# Patient Record
Sex: Female | Born: 1950
Health system: Southern US, Community
[De-identification: ages and names within clinical notes are randomized; demographics above are authoritative.]

## PROBLEM LIST (undated history)

## (undated) ENCOUNTER — Ambulatory Visit (HOSPITAL_COMMUNITY): Admission: EM | Payer: Medicare Other | Source: Home / Self Care

## (undated) DIAGNOSIS — G43909 Migraine, unspecified, not intractable, without status migrainosus: Secondary | ICD-10-CM

## (undated) DIAGNOSIS — G4730 Sleep apnea, unspecified: Secondary | ICD-10-CM

## (undated) DIAGNOSIS — D689 Coagulation defect, unspecified: Secondary | ICD-10-CM

## (undated) DIAGNOSIS — J438 Other emphysema: Secondary | ICD-10-CM

## (undated) DIAGNOSIS — D649 Anemia, unspecified: Secondary | ICD-10-CM

## (undated) DIAGNOSIS — I10 Essential (primary) hypertension: Secondary | ICD-10-CM

## (undated) DIAGNOSIS — Z87891 Personal history of nicotine dependence: Secondary | ICD-10-CM

## (undated) DIAGNOSIS — H269 Unspecified cataract: Secondary | ICD-10-CM

## (undated) DIAGNOSIS — I700 Atherosclerosis of aorta: Secondary | ICD-10-CM

## (undated) DIAGNOSIS — T7840XA Allergy, unspecified, initial encounter: Secondary | ICD-10-CM

## (undated) DIAGNOSIS — R911 Solitary pulmonary nodule: Secondary | ICD-10-CM

## (undated) DIAGNOSIS — D473 Essential (hemorrhagic) thrombocythemia: Secondary | ICD-10-CM

## (undated) DIAGNOSIS — I7121 Aneurysm of the ascending aorta, without rupture: Secondary | ICD-10-CM

## (undated) HISTORY — DX: Essential (hemorrhagic) thrombocythemia: D47.3

## (undated) HISTORY — PX: NASAL SEPTUM SURGERY: SHX37

## (undated) HISTORY — PX: COSMETIC SURGERY: SHX468

## (undated) HISTORY — DX: Migraine, unspecified, not intractable, without status migrainosus: G43.909

## (undated) HISTORY — DX: Atherosclerosis of aorta: I70.0

## (undated) HISTORY — DX: Anemia, unspecified: D64.9

## (undated) HISTORY — DX: Allergy, unspecified, initial encounter: T78.40XA

## (undated) HISTORY — DX: Other emphysema: J43.8

## (undated) HISTORY — DX: Essential (primary) hypertension: I10

## (undated) HISTORY — DX: Coagulation defect, unspecified: D68.9

## (undated) HISTORY — DX: Aneurysm of the ascending aorta, without rupture: I71.21

## (undated) HISTORY — DX: Sleep apnea, unspecified: G47.30

## (undated) HISTORY — DX: Unspecified cataract: H26.9

## (undated) HISTORY — DX: Personal history of nicotine dependence: Z87.891

## (undated) HISTORY — DX: Solitary pulmonary nodule: R91.1

---

## 2013-02-14 ENCOUNTER — Emergency Department (HOSPITAL_COMMUNITY)
Admission: EM | Admit: 2013-02-14 | Discharge: 2013-02-15 | Disposition: A | Discharge status: Home / Self Care | Payer: Self-pay | Attending: Emergency Medicine | Admitting: Emergency Medicine

## 2013-02-14 ENCOUNTER — Encounter (HOSPITAL_COMMUNITY): Payer: Self-pay | Admitting: Emergency Medicine

## 2013-02-14 DIAGNOSIS — Z880 Allergy status to penicillin: Secondary | ICD-10-CM | POA: Insufficient documentation

## 2013-02-14 DIAGNOSIS — Z79899 Other long term (current) drug therapy: Secondary | ICD-10-CM | POA: Insufficient documentation

## 2013-02-14 DIAGNOSIS — F1010 Alcohol abuse, uncomplicated: Secondary | ICD-10-CM

## 2013-02-14 DIAGNOSIS — F17200 Nicotine dependence, unspecified, uncomplicated: Secondary | ICD-10-CM | POA: Insufficient documentation

## 2013-02-14 DIAGNOSIS — R630 Anorexia: Secondary | ICD-10-CM | POA: Insufficient documentation

## 2013-02-14 DIAGNOSIS — F1020 Alcohol dependence, uncomplicated: Secondary | ICD-10-CM | POA: Insufficient documentation

## 2013-02-14 LAB — RAPID URINE DRUG SCREEN, HOSP PERFORMED
Barbiturates: NOT DETECTED
Benzodiazepines: NOT DETECTED
Cocaine: NOT DETECTED

## 2013-02-14 LAB — COMPREHENSIVE METABOLIC PANEL
Albumin: 5 g/dL (ref 3.5–5.2)
Alkaline Phosphatase: 95 U/L (ref 39–117)
BUN: 7 mg/dL (ref 6–23)
Chloride: 95 mEq/L — ABNORMAL LOW (ref 96–112)
GFR calc Af Amer: >90 mL/min (ref >90)
Glucose, Bld: 78 mg/dL (ref 70–99)
Potassium: 4.2 mEq/L (ref 3.5–5.1)
Sodium: 133 mEq/L — ABNORMAL LOW (ref 135–145)
Total Bilirubin: 0.6 mg/dL (ref 0.3–1.2)

## 2013-02-14 LAB — CBC
HCT: 44.5 % (ref 36.0–46.0)
Hemoglobin: 16 g/dL — ABNORMAL HIGH (ref 12.0–15.0)
MCHC: 36 g/dL (ref 30.0–36.0)
MCV: 93.5 fL (ref 78.0–100.0)
RBC: 4.76 MIL/uL (ref 3.87–5.11)
RDW: 14 % (ref 11.5–15.5)
WBC: 11.6 10*3/uL — ABNORMAL HIGH (ref 4.0–10.5)

## 2013-02-14 LAB — ETHANOL: Alcohol, Ethyl (B): 266 mg/dL — ABNORMAL HIGH (ref 0–11)

## 2013-02-14 LAB — ACETAMINOPHEN LEVEL: Acetaminophen (Tylenol), Serum: <15 ug/mL (ref 10–30)

## 2013-02-14 MED ORDER — ALUM & MAG HYDROXIDE-SIMETH 200-200-20 MG/5ML PO SUSP: 30.0000 mL | ORAL | Status: DC | PRN

## 2013-02-14 MED ORDER — LORAZEPAM 1 MG PO TABS: 0.0000 mg | ORAL_TABLET | Freq: Two times a day (BID) | ORAL | Status: DC

## 2013-02-14 MED ORDER — ZOLPIDEM TARTRATE 5 MG PO TABS: 5.0000 mg | ORAL_TABLET | Freq: Every evening | ORAL | Status: DC | PRN

## 2013-02-14 MED ORDER — ACETAMINOPHEN 325 MG PO TABS: 650.0000 mg | ORAL_TABLET | ORAL | Status: DC | PRN

## 2013-02-14 MED ORDER — IBUPROFEN 200 MG PO TABS: 600.0000 mg | ORAL_TABLET | Freq: Three times a day (TID) | ORAL | Status: DC | PRN

## 2013-02-14 MED ORDER — ONDANSETRON HCL 4 MG PO TABS: 4.0000 mg | ORAL_TABLET | Freq: Three times a day (TID) | ORAL | Status: DC | PRN

## 2013-02-14 MED ORDER — LORAZEPAM 1 MG PO TABS
0.0000 mg | ORAL_TABLET | Freq: Four times a day (QID) | ORAL | Status: DC
  Administered 2013-02-14 – 2013-02-15 (×2): 1 mg via ORAL
  Filled 2013-02-14 (×2): qty 1

## 2013-02-14 NOTE — ED Provider Notes (Signed)
CSN: [id number]     Arrival date & time 02/14/13  1616 History  This chart was scribed for non-physician practitioner working with Shon Baton, MD by Ashley Jacobs, ED scribe. This patient was seen in room [facility] and the patient's care was started at 5:30 PM.   First MD Initiated Contact with Patient 02/14/13 1652     Chief Complaint  Patient presents with  . Medical Clearance   (Consider location/radiation/quality/duration/timing/severity/associated sxs/prior Treatment) The history is provided by the patient and medical records. No language interpreter was used.   HPI Comments: Kara [name] is a 62 y.o. female who presents to the Emergency Department complaining for medical clearance. Pt reports that she desire to detox from alcohol because she has "been drinking too much". She currently drinks 2 bottles of wine everyday and admits that she drank 1 bottle one of wine today. She also complains of lack of appetite for the past 5 days.  Pt denies SI and HI. She is tearful and states she "felt so sick" and "if I keep this up, I am going to die".  She denies abdominal pain. Pt smoke tobacco every day and explains that she has been "chain smoking" for the past months.Pt reports she has "hemochromatosis".  She has allergies to Penicillins. She does not uses drugs.  History reviewed. No pertinent past medical history. History reviewed. No pertinent past surgical history. No family history on file. History  Substance Use Topics  . Smoking status: Current Every Day Smoker  . Smokeless tobacco: Not on file  . Alcohol Use: Yes     Comment: 2 bottles of wine/day   OB History   Grav Para Term Preterm Abortions TAB SAB Ect Mult Living                 Review of Systems  Constitutional: Positive for appetite change.  Gastrointestinal: Negative for abdominal pain.  Psychiatric/Behavioral: Negative for suicidal ideas.    Allergies  Penicillins  Home Medications  No current  outpatient prescriptions on file. BP 130/93  Pulse 87  Temp(Src) 97.6 F (36.4 C) (Oral)  Resp 18  SpO2 97% Physical Exam  Nursing note and vitals reviewed. Constitutional: She is oriented to person, place, and time. She appears well-developed and well-nourished. No distress.  Very tearful and labile emotions  HENT:  Head: Normocephalic and atraumatic.  Mouth/Throat: Oropharynx is clear and moist.  Eyes: Conjunctivae are normal. Pupils are equal, round, and reactive to light. No scleral icterus.  Neck: Neck supple.  Cardiovascular: Normal rate, regular rhythm, normal heart sounds and intact distal pulses.   No murmur heard. Pulmonary/Chest: Effort normal and breath sounds normal. No stridor. No respiratory distress. She has no rales.  Abdominal: Soft. Bowel sounds are normal. She exhibits no distension. There is no tenderness.  Musculoskeletal: Normal range of motion.  Neurological: She is alert and oriented to person, place, and time.  Skin: Skin is warm and dry. No rash noted.  Psychiatric: She has a normal mood and affect. Her behavior is normal.    ED Course  Procedures (including critical care time) DIAGNOSTIC STUDIES: Oxygen Saturation is 97% on room air, normal by my interpretation.    COORDINATION OF CARE: 5:34 PM Discussed course of care with pt consult with psychologist. Pt understands and agrees.  7:31 PM Pt is medically cleared.  Will initiate psych hold, contact TSS, CIWA protocol placed.  Labs Review Labs Reviewed  CBC - Abnormal; Notable for the following:    WBC  11.6 (*)    Hemoglobin 16.0 (*)    All other components within normal limits  COMPREHENSIVE METABOLIC PANEL - Abnormal; Notable for the following:    Sodium 133 (*)    Chloride 95 (*)    Creatinine, Ser 0.38 (*)    AST 117 (*)    ALT 76 (*)    All other components within normal limits  ETHANOL - Abnormal; Notable for the following:    Alcohol, Ethyl (B) 266 (*)    All other components within  normal limits  SALICYLATE LEVEL - Abnormal; Notable for the following:    Salicylate Lvl <2.0 (*)    All other components within normal limits  ACETAMINOPHEN LEVEL  URINE RAPID DRUG SCREEN (HOSP PERFORMED)   Imaging Review No results found.  EKG Interpretation   None       MDM   1. Alcohol abuse    BP 130/93  Pulse 87  Temp(Src) 97.6 F (36.4 C) (Oral)  Resp 18  SpO2 97%   I personally performed the services described in this documentation, which was scribed in my presence. The recorded information has been reviewed and is accurate.    Fayrene Helper, PA-C 02/14/13 [phone]

## 2013-02-14 NOTE — BH Assessment (Addendum)
Assessment Note  Kara [name] is an 62 y.o. female. Pt present to [facility] requesting detox from alcohol.  Pt reports that she was sober for 16 months and began drinking again in August 2014.  Pt reports she is currently drinking 1 1/2 bottles of wine per day.  Pt denies current withdrawals but does report withdrawals including sweats and tremors.  Pt reports she feels like the drinking is different these last few days:"I felt like I wasn't going to stop."  She called a friend who talked her into coming.  Pt denies depression, denies SI/HI/AV.  Axis I: alcohol dependence Axis II: Deferred Axis III: History reviewed. No pertinent past medical history. Axis IV: problems with primary support group Axis V: 41-50 serious symptoms  Past Medical History: History reviewed. No pertinent past medical history.  History reviewed. No pertinent past surgical history.  Family History: No family history on file.  Social History:  reports that she has been smoking.  She does not have any smokeless tobacco history on file. She reports that she drinks alcohol. She reports that she does not use illicit drugs.  Additional Social History:  Alcohol / Drug Use Pain Medications: pt denies Prescriptions: pt denies History of alcohol / drug use?: Yes Substance #1 Name of Substance 1: alcohol-wine 1 - Age of First Use: 20 1 - Amount (size/oz): 1 1/2 bottles 1 - Frequency: daily 1 - Duration: 2 1/2 months 1 - Last Use / Amount: 10/30 1 bottle wine  CIWA: CIWA-Ar BP: 132/80 mmHg Pulse Rate: 82 Nausea and Vomiting: no nausea and no vomiting Tactile Disturbances: none Tremor: no tremor Auditory Disturbances: not present Paroxysmal Sweats: no sweat visible Visual Disturbances: not present Anxiety: mildly anxious Headache, Fullness in Head: none present Agitation: two Orientation and Clouding of Sensorium: oriented and can do serial additions CIWA-Ar Total: 3 COWS:    Allergies:  Allergies  Allergen  Reactions  . Penicillins Rash    Home Medications:  (Not in a hospital admission)  OB/GYN Status:  No LMP recorded. Patient is postmenopausal.  General Assessment Data Location of Assessment: [facility] ACT Assessment: Yes Is this a Tele or Face-to-Face Assessment?: Face-to-Face Is this an Initial Assessment or a Re-assessment for this encounter?: Initial Assessment Living Arrangements: Parent Can pt return to current living arrangement?: Yes Admission Status: Voluntary     [facility] Crisis Care Plan Living Arrangements: Parent Name of Psychiatrist: none Name of Therapist: none     Risk to self Suicidal Ideation: No Suicidal Intent: No Is patient at risk for suicide?: No Suicidal Plan?: No Access to Means: No What has been your use of drugs/alcohol within the last 12 months?: current significant alcohol use Previous Attempts/Gestures: No Intentional Self Injurious Behavior: None Family Suicide History: No Recent stressful life event(s): Other (Comment) (lives with aging mother, other family conflict) Persecutory voices/beliefs?: No Depression: No Substance abuse history and/or treatment for substance abuse?: Yes Suicide prevention information given to non-admitted patients: Not applicable  Risk to Others Homicidal Ideation: No Thoughts of Harm to Others: No Current Homicidal Intent: No Current Homicidal Plan: No Access to Homicidal Means: No History of harm to others?: No Assessment of Violence: None Noted Does patient have access to weapons?: No Criminal Charges Pending?: No Does patient have a court date: No  Psychosis Hallucinations: None noted Delusions: None noted  Mental Status Report Appear/Hygiene: Other (Comment) (casual) Eye Contact: Good Motor Activity: Agitation Speech: Logical/coherent Level of Consciousness: Alert Mood: Anxious Affect: Anxious Anxiety Level: Moderate Thought  Processes: Coherent;Relevant Judgement: Unimpaired Orientation:  Person;Place;Time;Situation Obsessive Compulsive Thoughts/Behaviors: None  Cognitive Functioning Concentration: Normal Memory: Recent Intact;Remote Intact IQ: Average Insight: Good Impulse Control: Fair Appetite: Poor Weight Loss:  (yes, unknown amount) Weight Gain: 0 Sleep: No Change Total Hours of Sleep: 6 Vegetative Symptoms: None  ADLScreening [facility] Assessment Services) Patient's cognitive ability adequate to safely complete daily activities?: Yes Patient able to express need for assistance with ADLs?: Yes Independently performs ADLs?: Yes (appropriate for developmental age)  Prior Inpatient Therapy Prior Inpatient Therapy: Yes Prior Therapy Dates: 1990s Prior Therapy Facilty/Provider(s): [place] [facility] Reason for Treatment: alcohol  Prior Outpatient Therapy Prior Outpatient Therapy: No  ADL Screening (condition at time of admission) Patient's cognitive ability adequate to safely complete daily activities?: Yes Patient able to express need for assistance with ADLs?: Yes Independently performs ADLs?: Yes (appropriate for developmental age)       Abuse/Neglect Assessment (Assessment to be complete while patient is alone) Physical Abuse: Denies Verbal Abuse: Denies Sexual Abuse: Denies Exploitation of patient/patient's resources: Denies Self-Neglect: Denies Values / Beliefs Cultural Requests During Hospitalization: None Spiritual Requests During Hospitalization: None   Advance Directives (For Healthcare) Advance Directive: Patient does not have advance directive;Patient would not like information    Additional Information 1:1 In Past 12 Months?: No CIRT Risk: No Elopement Risk: No Does patient have medical clearance?: Yes     Disposition: Discussed pt with Dr Criss Alvine and Horton of [facility].  Will pursue detox admit for pt.  [facility] full.  REferral sent to [facility], who reports they do have beds.  BAC will have to come down below  140. Disposition Initial Assessment Completed for this Encounter: Yes  On Site Evaluation by:   Reviewed with Physician:    Lorri Frederick 02/14/2013 9:32 PM

## 2013-02-14 NOTE — ED Provider Notes (Signed)
Medical screening examination/treatment/procedure(s) were performed by non-physician practitioner and as supervising physician I was immediately available for consultation/collaboration.  EKG Interpretation   None        Courtney F Horton, MD 02/14/13 2319 

## 2013-02-14 NOTE — BH Assessment (Signed)
[facility] Assessment Progress Note  Notified Daleen Squibb, TS, that pt needs assessment at 19:45.  Doylene Canning, MA Triage Specialist 02/14/13 @ 19:54

## 2013-02-14 NOTE — ED Notes (Addendum)
Pt states she has not eaten in 5 days, has been drinking 2 bottles of wine per day over past 2 weeks

## 2013-02-14 NOTE — ED Notes (Signed)
Pt transferred from triage presents for detox from alcohol, pt report she drinks a couple of bottles of wine per day.  Pt reports she is afraid to get clean by herself.  Denies drug use, no SI, no HI or AV hallucinations.  Not feeling hopeless.  Pt cooperative but anxious.

## 2013-02-14 NOTE — BH Assessment (Signed)
[facility] Assessment Progress Note 02/14/13. 2245.  Sandy at [facility] accepts pt pending BAC dropping below 140.  She asked that we not authorize pt and they will do it tomorrow.  Please call them with transportation plan. Daleen Squibb, LCSW

## 2013-02-14 NOTE — ED Notes (Signed)
Pt states she has been "drinking way too much" and wanting help with detox. Last drink was this afternoon, pt states she drank wine. Pt tearful at this time. Denies SI/HI

## 2013-02-15 LAB — ETHANOL: Alcohol, Ethyl (B): 28 mg/dL — ABNORMAL HIGH (ref 0–11)

## 2013-02-15 NOTE — ED Provider Notes (Signed)
Patient accepted at [facility] for detox from alcohol. Stable to d/c and patient will be brought there directly.   Richardean Canal, MD 02/15/13 [phone]

## 2014-09-18 ENCOUNTER — Encounter (HOSPITAL_COMMUNITY): Payer: Self-pay | Admitting: Family Medicine

## 2014-09-18 ENCOUNTER — Emergency Department (INDEPENDENT_AMBULATORY_CARE_PROVIDER_SITE_OTHER)
Admission: EM | Admit: 2014-09-18 | Discharge: 2014-09-18 | Disposition: A | Discharge status: Home / Self Care | Payer: Self-pay | Source: Home / Self Care | Attending: Family Medicine | Admitting: Family Medicine

## 2014-09-18 DIAGNOSIS — H8113 Benign paroxysmal vertigo, bilateral: Secondary | ICD-10-CM

## 2014-09-18 DIAGNOSIS — G43809 Other migraine, not intractable, without status migrainosus: Secondary | ICD-10-CM

## 2014-09-18 DIAGNOSIS — G43109 Migraine with aura, not intractable, without status migrainosus: Secondary | ICD-10-CM

## 2014-09-18 HISTORY — DX: Hemochromatosis, unspecified: E83.119

## 2014-09-18 MED ORDER — MECLIZINE HCL 50 MG PO TABS: 50.0000 mg | ORAL_TABLET | Freq: Two times a day (BID) | ORAL | Status: DC | PRN

## 2014-09-18 NOTE — ED Provider Notes (Signed)
CSN: [id number]     Arrival date & time 09/18/14  0800 History   First MD Initiated Contact with Patient 09/18/14 [phone]     Chief Complaint  Patient presents with  . Dizziness   (Consider location/radiation/quality/duration/timing/severity/associated sxs/prior Treatment) HPI Patient presenting today with complaints of intermittent episodes of dizziness described as the room spinning, vertigo. These episodes are typically lasting for 10 minutes. Resolve with lying still and resting. Last night patient's episode was associated with one bout of emesis. Patient states that these will come on multiple times per week and typically if she lays still symptoms will go away however she makes any quick head movements the symptoms become significant and severe forcing her to stop moving completely. Patient with history of ocular migraines. Symptoms are associated with a clammy nausea feeling. Denies chest pain, palpations, shortest breath, LOC, headache, fever, hearing loss, vision change, Donna pain, dysuria, frequency, back pain. Episodes typically come on while patient is sitting and denies any orthostatic symptoms, or vasovagal type symptomatology. She is currently symptomatically.   Past Medical History  Diagnosis Date  . Hemochromatosis    History reviewed. No pertinent past surgical history. Family History  Problem Relation Age of Onset  . Diabetes Neg Hx   . Cancer Neg Hx   . Heart failure Neg Hx   . Hyperlipidemia Neg Hx   . Hypertension Neg Hx    History  Substance Use Topics  . Smoking status: Current Every Day Smoker -- 1.00 packs/day  . Smokeless tobacco: Not on file  . Alcohol Use: Yes     Comment: 2 bottles of wine/day   OB History    No data available     Review of Systems Per HPI with all other pertinent systems negative.   Allergies  Penicillins  Home Medications   Prior to Admission medications   Medication Sig Start Date End Date Taking? Authorizing Provider   meclizine (ANTIVERT) 50 MG tablet Take 1 tablet (50 mg total) by mouth 2 (two) times daily as needed. 09/18/14   Waldemar Dickens, MD   BP 129/84 mmHg  Pulse 77  Temp(Src) 97 F (36.1 C) (Oral)  Resp 14  SpO2 96% Physical Exam Physical Exam  Constitutional: oriented to person, place, and time. appears well-developed and well-nourished. No distress.  HENT:  Head: Normocephalic and atraumatic.  Eyes: EOMI. PERRL.  Neck: Normal range of motion.  Cardiovascular: RRR, no m/r/g, 2+ distal pulses,  Pulmonary/Chest: Effort normal and breath sounds normal. No respiratory distress.  Abdominal: Soft. Bowel sounds are normal. NonTTP, no distension.  Musculoskeletal: Normal range of motion. Non ttp, no effusion.  Neurological: O 3, cranial nerves intact, his oxygen was incorporated fashion,.  Skin: Skin is warm. No rash noted. non diaphoretic.  Psychiatric: normal mood and affect. behavior is normal. Judgment and thought content normal.   ED Course  Procedures (including critical care time) Labs Review Labs Reviewed - No data to display  Imaging Review No results found.   MDM   1. BPV (benign positional vertigo), bilateral   2. Ocular migraine    Epley's maneuvers, Antivert, follow-up with PCP and/or ENT back home in [place]. Unlikely related to ocular migraines but place the a change in her aura associated with migraines.    Waldemar Dickens, MD 09/18/14 [phone]

## 2014-09-18 NOTE — ED Notes (Signed)
Patient not in room, in baTHROOM

## 2014-09-18 NOTE — Discharge Instructions (Signed)
You are likely suffering from BPV which is an inner ear problem causing balance issues. Please start performing Epley's maneuvers. Please consider pretreatment with Antivert. Please follow-up with your primary care physician or an ENT doctor if your symptoms do not improve.  Epley Maneuver Self-Care WHAT IS THE EPLEY MANEUVER? The Epley maneuver is an exercise you can do to relieve symptoms of benign paroxysmal positional vertigo (BPPV). This condition is often just referred to as vertigo. BPPV is caused by the movement of tiny crystals (canaliths) inside your inner ear. The accumulation and movement of canaliths in your inner ear causes a sudden spinning sensation (vertigo) when you move your head to certain positions. Vertigo usually lasts about 30 seconds. BPPV usually occurs in just one ear. If you get vertigo when you lie on your left side, you probably have BPPV in your left ear. Your health care provider can tell you which ear is involved.  BPPV may be caused by a head injury. Many people older than 50 get BPPV for unknown reasons. If you have been diagnosed with BPPV, your health care provider may teach you how to do this maneuver. BPPV is not life threatening (benign) and usually goes away in time.  WHEN SHOULD I PERFORM THE EPLEY MANEUVER? You can do this maneuver at home whenever you have symptoms of vertigo. You may do the Epley maneuver up to 3 times a day until your symptoms of vertigo go away. HOW SHOULD I DO THE EPLEY MANEUVER? 1. Sit on the edge of a bed or table with your back straight. Your legs should be extended or hanging over the edge of the bed or table.  2. Turn your head halfway toward the affected ear.  3. Lie backward quickly with your head turned until you are lying flat on your back. You may want to position a pillow under your shoulders.  4. Hold this position for 30 seconds. You may experience an attack of vertigo. This is normal. Hold this position until the vertigo  stops. 5. Then turn your head to the opposite direction until your unaffected ear is facing the floor.  6. Hold this position for 30 seconds. You may experience an attack of vertigo. This is normal. Hold this position until the vertigo stops. 7. Now turn your whole body to the same side as your head. Hold for another 30 seconds.  8. You can then sit back up. ARE THERE RISKS TO THIS MANEUVER? In some cases, you may have other symptoms (such as changes in your vision, weakness, or numbness). If you have these symptoms, stop doing the maneuver and call your health care provider. Even if doing these maneuvers relieves your vertigo, you may still have dizziness. Dizziness is the sensation of light-headedness but without the sensation of movement. Even though the Epley maneuver may relieve your vertigo, it is possible that your symptoms will return within 5 years. WHAT SHOULD I DO AFTER THIS MANEUVER? After doing the Epley maneuver, you can return to your normal activities. Ask your doctor if there is anything you should do at home to prevent vertigo. This may include:  Sleeping with two or more pillows to keep your head elevated.  Not sleeping on the side of your affected ear.  Getting up slowly from bed.  Avoiding sudden movements during the day.  Avoiding extreme head movement, like looking up or bending over.  Wearing a cervical collar to prevent sudden head movements. WHAT SHOULD I DO IF MY SYMPTOMS GET  WORSE? Call your health care provider if your vertigo gets worse. Call your provider right way if you have other symptoms, including:   Nausea.  Vomiting.  Headache.  Weakness.  Numbness.  Vision changes. Document Released: 04/09/2013 Document Reviewed: 04/09/2013 [facility] Patient Information 2015 [place], [place]. This information is not intended to replace advice given to you by your health care provider. Make sure you discuss any questions you have with your health care  provider.

## 2014-09-18 NOTE — ED Notes (Signed)
Reports dizziness that started around midnight.  Dizziness came on quickly and was worse than in the past.  Patient had to lie down and had episode of vomiting.  Today has a dull headache she relates to sleep being interrupted.

## 2016-03-14 ENCOUNTER — Ambulatory Visit (HOSPITAL_COMMUNITY)
Admission: EM | Admit: 2016-03-14 | Discharge: 2016-03-14 | Disposition: A | Discharge status: Home / Self Care | Payer: Self-pay | Attending: Emergency Medicine | Admitting: Emergency Medicine

## 2016-03-14 ENCOUNTER — Encounter (HOSPITAL_COMMUNITY): Payer: Self-pay | Admitting: *Deleted

## 2016-03-14 DIAGNOSIS — R1031 Right lower quadrant pain: Secondary | ICD-10-CM

## 2016-03-14 LAB — POCT URINALYSIS DIP (DEVICE)
Bilirubin Urine: NEGATIVE
Glucose, UA: NEGATIVE mg/dL
HGB URINE DIPSTICK: NEGATIVE
KETONES UR: NEGATIVE mg/dL
Leukocytes, UA: NEGATIVE
Nitrite: NEGATIVE
PH: 6.5 (ref 5.0–8.0)
PROTEIN: NEGATIVE mg/dL
Urobilinogen, UA: 0.2 mg/dL (ref 0.0–1.0)

## 2016-03-14 NOTE — ED Provider Notes (Signed)
CSN: [id number]     Arrival date & time 03/14/16  1229 History   First MD Initiated Contact with Patient 03/14/16 1425     Chief Complaint  Patient presents with  . Abdominal Pain   (Consider location/radiation/quality/duration/timing/severity/associated sxs/prior Treatment) 65 year old female presents with right sided lower abdominal pain that started 4 days ago and getting worse. Also experiencing slight nausea but no vomiting, diarrhea or constipation. Also denies any fever, urinary symptoms or unusual vaginal discharge. She has noticed the pain increases with movement but stays mostly localized to right lower quadrant. She takes no daily medication and has no chronic health issues.    The history is provided by the patient.    Past Medical History:  Diagnosis Date  . Hemochromatosis    History reviewed. No pertinent surgical history. Family History  Problem Relation Age of Onset  . Diabetes Neg Hx   . Cancer Neg Hx   . Heart failure Neg Hx   . Hyperlipidemia Neg Hx   . Hypertension Neg Hx    Social History  Substance Use Topics  . Smoking status: Current Every Day Smoker    Packs/day: 1.00  . Smokeless tobacco: Not on file  . Alcohol use Yes     Comment: 2 bottles of wine/day   OB History    No data available     Review of Systems  Constitutional: Negative for activity change, appetite change, chills, fatigue and fever.  Respiratory: Negative for cough, chest tightness and shortness of breath.   Cardiovascular: Negative for chest pain.  Gastrointestinal: Positive for abdominal pain. Negative for blood in stool, constipation, diarrhea, nausea and vomiting.  Genitourinary: Negative for difficulty urinating, dysuria, flank pain and vaginal discharge.  Musculoskeletal: Negative for back pain.  Skin: Negative for rash and wound.  Neurological: Negative for dizziness, syncope, weakness, light-headedness, numbness and headaches.  Hematological: Negative for adenopathy.     Allergies  Penicillins  Home Medications   Prior to Admission medications   Not on File   Meds Ordered and Administered this Visit  Medications - No data to display  BP 147/97 (BP Location: Left Arm)   Pulse 83   Temp 98 F (36.7 C) (Oral)   Resp 12   SpO2 97%  No data found.   Physical Exam  Constitutional: She is oriented to person, place, and time. She appears well-developed and well-nourished. No distress.  She is resting comfortably on exam table.   HENT:  Head: Normocephalic and atraumatic.  Nose: Nose normal.  Mouth/Throat: Oropharynx is clear and moist.  Neck: Normal range of motion. Neck supple.  Cardiovascular: Normal rate, regular rhythm and normal heart sounds.   Pulmonary/Chest: Effort normal and breath sounds normal. No respiratory distress.  Abdominal: Soft. Bowel sounds are normal. There is no hepatosplenomegaly. There is tenderness in the right lower quadrant. There is rebound. There is no rigidity, no guarding and no CVA tenderness.  Lymphadenopathy:    She has no cervical adenopathy.  Neurological: She is alert and oriented to person, place, and time.  Skin: Skin is warm and dry. Capillary refill takes less than 2 seconds.  Psychiatric: She has a normal mood and affect. Her behavior is normal. Judgment and thought content normal.    Urgent Care Course   Clinical Course     Procedures (including critical care time)  Labs Review Labs Reviewed  POCT URINALYSIS DIP (DEVICE)    Imaging Review No results found.   Visual Acuity Review  Right Eye Distance:   Left Eye Distance:   Bilateral Distance:    Right Eye Near:   Left Eye Near:    Bilateral Near:         MDM   1. Right lower quadrant abdominal pain    Reviewed negative urinalysis results with patient. Discussed various possibilities of cause of pain- such as appendicitis, ovarian cyst, renal calculi, diverticulitis with patient. Since she  does not have health insurance,  she is hesitant to go to the ER for further evaluation and imaging. She declines any blood work here. We discussed consequences of not seeking immediate treatment (appendix rupture, sepsis) and she expressed verbal understanding of these consequences. She wants to trial Ibuprofen 600mg  every 8 hours as needed for pain. If pain continues to get worse or any fever or vomiting occurs, she will go to ER ASAP.     Katy Apo, NP 03/15/16 1041

## 2016-03-14 NOTE — Discharge Instructions (Signed)
Recommend try Ibuprofen 600mg  every 6 to 8 hours as needed for pain. If pain continues to get worse, go to ER.

## 2016-03-14 NOTE — ED Triage Notes (Signed)
Pt  Has   r  Lower  Quadrant  Pain   For         sev   Days        Perhaps   Slight  Nausea      No  Diarrhea  No  Vomiting         Pt  Reports   The  pain is  At a  5       She  Is   Speaking in  Complete    sentances

## 2016-04-28 ENCOUNTER — Encounter: Payer: Self-pay | Admitting: Family Medicine

## 2016-04-28 ENCOUNTER — Ambulatory Visit (INDEPENDENT_AMBULATORY_CARE_PROVIDER_SITE_OTHER): Payer: BLUE CROSS/BLUE SHIELD | Admitting: Family Medicine

## 2016-04-28 VITALS — BP 115/72 | HR 72 | Temp 98.3°F | Ht 60.0 in | Wt 112.0 lb

## 2016-04-28 DIAGNOSIS — Z23 Encounter for immunization: Secondary | ICD-10-CM

## 2016-04-28 DIAGNOSIS — Z13220 Encounter for screening for lipoid disorders: Secondary | ICD-10-CM | POA: Diagnosis not present

## 2016-04-28 DIAGNOSIS — Z131 Encounter for screening for diabetes mellitus: Secondary | ICD-10-CM | POA: Diagnosis not present

## 2016-04-28 DIAGNOSIS — Z1329 Encounter for screening for other suspected endocrine disorder: Secondary | ICD-10-CM

## 2016-04-28 DIAGNOSIS — F17200 Nicotine dependence, unspecified, uncomplicated: Secondary | ICD-10-CM

## 2016-04-28 DIAGNOSIS — R42 Dizziness and giddiness: Secondary | ICD-10-CM

## 2016-04-28 NOTE — Progress Notes (Signed)
Pre visit review using our clinic review tool, if applicable. No additional management support is needed unless otherwise documented below in the visit note. 

## 2016-04-28 NOTE — Progress Notes (Addendum)
[facility] [address] [place], [place] [address] [phone]  Date:  04/28/2016   Name:  [place]ara [name]   DOB:  [birth year]   MRN:  [id number]  PCP:  No PCP Per Patient    Chief Complaint: Dizziness (c/o vertigo first noticed 66 yrs old, 18 months ago very severe attack treated by [name]. Sx's have come back off and on for Kara past 6 months. Only one episode of double vision a few months ago. Pt states that occ before Kara vertigo starts her eyes feel like they are burning but without Kara heat, body will get cold, will feel Kara need to urinate. Also concern of occ burning and itching in right foot and left arm will also go numb sometimes. )   History of Present Illness:  Kara [name] is a 66 y.o. very pleasant female patient who presents with Kara following:  Here today as a new patient.  She has a history of vertigo over Kara last 2-3 years.  She was seen at [name] 09/2014 with vertigo- she was treated with Epley maneuvers.  Her sx seemed to resolve until May of 2017-then started to recur.  Today is Thursday- on Tuesday she was walking into a store and felt such severe spinning that she was afraid she might fall and she lay down onto Kara floor.  She was so dizzy that she could not get up for about 10 minutes- had to call someone to pick her up.    Yesterday she did not feel 100%- slight HA, tired.  Today she feels pretty much back to normal  Back in 2016 she was vomiting with her vertigo- none since.   Never seen by neurology- she might like to be seen to make sure there is nothing else going on She has some tingling/ burning in her right foot (perhaps a bit on Kara left but really more on Kara right) for maybe 6 months off an on She will have occasional sx of "my left arm going completely paralyzed" over Kara last few years.  However she thought this was due to overuse and did not think much of it Never had any difficulty speaking or  swallowing.  No facial drooping Sometimes when an attack of vertigo is coming on she will feel like her eyes are burning- bilaterall.  Otherwise no prodrome noted  She does not feel like she is under any unusual stress.   No headaches generally She has felt rather tired as of late but otherwise has been in good health Last labs done in 2014 in epic.  She did go to UC in November with abd pain- she had just a urine test but no blood work.   She does have history of hemochromatosis that was treated with periodic phlebotomy when she was living in [place] (apparently this is a genetically common condition in [place]). No recent therapeutic phlebotomy. 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   She does smoke but is trying to cut down She would like a flu shot today   There are no active  problems to display for this patient.   Past Medical History:  Diagnosis Date  . Hemochromatosis     No past surgical history on file.  Social History  Substance Use Topics  . Smoking status: Current Every Day Smoker    Packs/day: 1.00  . Smokeless tobacco: Not on file  . Alcohol use Yes     Comment: 2 bottles of wine/day    Family History  Problem Relation Age of Onset  . Diabetes Neg Hx   . Cancer Neg Hx   . Heart failure Neg Hx   . Hyperlipidemia Neg Hx   . Hypertension Neg Hx     Allergies  Allergen Reactions  . Penicillins Rash    Medication list has been reviewed and updated.  No current outpatient prescriptions on file prior to visit.   No current facility-administered medications on file prior to visit.     Review of Systems:  As per HPI- otherwise negative.   Physical  Examination: Vitals:   04/28/16 1538  BP: 115/72  Pulse: 72  Temp: 98.3 F (36.8 C)   Vitals:   04/28/16 1538  Weight: 112 lb (50.8 kg)  Height: 5' (1.524 m)   Body mass index is 21.87 kg/m. Ideal Body Weight: Weight in (lb) to have BMI = 25: 127.7  GEN: WDWN, NAD, Non-toxic, A & O x 3, looks well, slim build.   HEENT: Atraumatic, Normocephalic. Neck supple. No masses, No LAD.  Bilateral TM wnl, oropharynx normal.  PEERL,EOMI.   Ears and Nose: No external deformity. CV: RRR, No M/G/R. No JVD. No thrill. No extra heart sounds. PULM: CTA B, no wheezes, crackles, rhonchi. No retractions. No resp. distress. No accessory muscle use. ABD: S, NT, ND, +BS. No rebound. No HSM. EXTR: No c/c/e NEURO Normal gait.  Complete neuro exam including strength and DTR of all extremities, romberg normal PSYCH: Normally interactive. Conversant. Not depressed or anxious appearing.  Calm demeanor.    Assessment and Plan: Vertigo - Plan: Ambulatory referral to Neurology  Screening for thyroid disorder - Plan: TSH  Screening for diabetes mellitus - Plan: Comprehensive metabolic panel, Hemoglobin A1c  Screening for hyperlipidemia - Plan: Lipid panel  Hereditary hemochromatosis ([name]) - Plan: CBC, Sedimentation rate, IBC panel, Ferritin  Tobacco use disorder  Here today to establish care and discuss a few concerns She is not fasting today, will get labs for her as above Encouraged her to quit smoking- she is thinking about it.  Advised that smoking may worsen her high hemoglobin Flu shot today  She has noted vertigo over Kara last couple of years- sometimes can be severe and is starting to worry her. Will refer to neurology per her request for evaluation of same Will plan further follow- up pending labs.  Signed Lamar Blinks, MD  1/18- labs as below.  Gave her a call- generally all look good except her ferritin and transferrin saturation are higher than we would like.  She would like to see  hematology which I will arrange.   Copy of labs to pt Plan recheck in 6 months   Results for orders placed or performed in visit on 04/28/16  CBC  Result Value Ref Range   WBC 12.4 (H) 4.0 - 10.5 K/uL   RBC 4.55 3.87 - 5.11 Mil/uL   Platelets 654.0 (H) 150.0 - 400.0 K/uL   Hemoglobin 14.1 12.0 - 15.0 g/dL   HCT 41.8 36.0 - 46.0 %   MCV 91.7 78.0 - 100.0 fl  MCHC 33.8 30.0 - 36.0 g/dL   RDW 15.8 (H) 11.5 - 15.5 %  Comprehensive metabolic panel  Result Value Ref Range   Sodium 138 135 - 145 mEq/L   Potassium 4.7 3.5 - 5.1 mEq/L   Chloride 108 96 - 112 mEq/L   CO2 25 19 - 32 mEq/L   Glucose, Bld 71 70 - 99 mg/dL   BUN 16 6 - 23 mg/dL   Creatinine, Ser 0.53 0.40 - 1.20 mg/dL   Total Bilirubin 0.3 0.2 - 1.2 mg/dL   Alkaline Phosphatase 66 39 - 117 U/L   AST 18 0 - 37 U/L   ALT 10 0 - 35 U/L   Total Protein 6.5 6.0 - 8.3 g/dL   Albumin 4.5 3.5 - 5.2 g/dL   Calcium 9.7 8.4 - 10.5 mg/dL   GFR 123.31 >60.00 mL/min  Lipid panel  Result Value Ref Range   Cholesterol 154 0 - 200 mg/dL   Triglycerides 73.0 0.0 - 149.0 mg/dL   HDL 62.30 >39.00 mg/dL   VLDL 14.6 0.0 - 40.0 mg/dL   LDL Cholesterol 77 0 - 99 mg/dL   Total CHOL/HDL Ratio 2    NonHDL 92.02   TSH  Result Value Ref Range   TSH 1.20 0.35 - 4.50 uIU/mL  Hemoglobin A1c  Result Value Ref Range   Hgb A1c MFr Bld 5.4 4.6 - 6.5 %  Sedimentation rate  Result Value Ref Range   Sed Rate 2 0 - 30 mm/hr  IBC panel  Result Value Ref Range   Iron 197 (H) 42 - 145 ug/dL   Transferrin 209.0 (L) 212.0 - 360.0 mg/dL   Saturation Ratios 67.3 (H) 20.0 - 50.0 %  Ferritin  Result Value Ref Range   Ferritin 220.9 10.0 - 291.0 ng/mL

## 2016-04-28 NOTE — Patient Instructions (Addendum)
It was very nice to see you today- we will arrange for you to see a neurologist to further evaluate your vertigo I will be in touch with your labs- if need be we can also arrange for you to see a hematologist to help manage your hemochromatosis I'll be in touch with your labs asap

## 2016-04-29 LAB — LIPID PANEL
Cholesterol: 154 mg/dL (ref 0–200)
HDL: 62.3 mg/dL (ref >39.00)
LDL Cholesterol: 77 mg/dL (ref 0–99)
NONHDL: 92.02
Total CHOL/HDL Ratio: 2
Triglycerides: 73 mg/dL (ref 0.0–149.0)
VLDL: 14.6 mg/dL (ref 0.0–40.0)

## 2016-04-29 LAB — COMPREHENSIVE METABOLIC PANEL
ALK PHOS: 66 U/L (ref 39–117)
ALT: 10 U/L (ref 0–35)
AST: 18 U/L (ref 0–37)
Albumin: 4.5 g/dL (ref 3.5–5.2)
BUN: 16 mg/dL (ref 6–23)
CO2: 25 mEq/L (ref 19–32)
Calcium: 9.7 mg/dL (ref 8.4–10.5)
Chloride: 108 mEq/L (ref 96–112)
Creatinine, Ser: 0.53 mg/dL (ref 0.40–1.20)
GFR: 123.31 mL/min (ref >60.00)
GLUCOSE: 71 mg/dL (ref 70–99)
POTASSIUM: 4.7 meq/L (ref 3.5–5.1)
SODIUM: 138 meq/L (ref 135–145)
TOTAL PROTEIN: 6.5 g/dL (ref 6.0–8.3)
Total Bilirubin: 0.3 mg/dL (ref 0.2–1.2)

## 2016-04-29 LAB — IBC PANEL
Iron: 197 ug/dL — ABNORMAL HIGH (ref 42–145)
Saturation Ratios: 67.3 % — ABNORMAL HIGH (ref 20.0–50.0)
TRANSFERRIN: 209 mg/dL — AB (ref 212.0–360.0)

## 2016-04-29 LAB — CBC
HEMATOCRIT: 41.8 % (ref 36.0–46.0)
HEMOGLOBIN: 14.1 g/dL (ref 12.0–15.0)
MCHC: 33.8 g/dL (ref 30.0–36.0)
MCV: 91.7 fl (ref 78.0–100.0)
Platelets: 654 10*3/uL — ABNORMAL HIGH (ref 150.0–400.0)
RBC: 4.55 Mil/uL (ref 3.87–5.11)
RDW: 15.8 % — ABNORMAL HIGH (ref 11.5–15.5)
WBC: 12.4 10*3/uL — AB (ref 4.0–10.5)

## 2016-04-29 LAB — SEDIMENTATION RATE: Sed Rate: 2 mm/hr (ref 0–30)

## 2016-04-29 LAB — FERRITIN: FERRITIN: 220.9 ng/mL (ref 10.0–291.0)

## 2016-04-29 LAB — HEMOGLOBIN A1C: Hgb A1c MFr Bld: 5.4 % (ref 4.6–6.5)

## 2016-04-29 LAB — TSH: TSH: 1.2 u[IU]/mL (ref 0.35–4.50)

## 2016-05-05 NOTE — Addendum Note (Signed)
Addended by: Lamar Blinks C on: 05/05/2016 05:21 PM   Modules accepted: Orders

## 2016-05-16 ENCOUNTER — Telehealth: Payer: Self-pay | Admitting: Family Medicine

## 2016-05-16 NOTE — Telephone Encounter (Signed)
Caller name:Anaija Face Relationship to patient: Can be reached:[phone] Pharmacy:  Reason for call:Has appt w/ Neurology on 06/28/16, does not want to wait that long. Not feeling any better.

## 2016-05-17 ENCOUNTER — Encounter: Payer: Self-pay | Admitting: Hematology and Oncology

## 2016-05-17 ENCOUNTER — Telehealth: Payer: Self-pay | Admitting: Hematology and Oncology

## 2016-05-17 NOTE — Telephone Encounter (Signed)
Called pt- she would like to try and be seen sooner.  Will ask referrals if we could try and set her up with [facility], they may be able to see her sooner

## 2016-05-17 NOTE — Telephone Encounter (Signed)
Appt has been scheduled for the pt to see Dr. Lindi Adie on 2/14 @345pm . Pt agreeed to the appt date and time. Demographics verified. Letter mailed.

## 2016-05-19 DIAGNOSIS — J3489 Other specified disorders of nose and nasal sinuses: Secondary | ICD-10-CM | POA: Insufficient documentation

## 2016-05-20 ENCOUNTER — Encounter: Payer: Self-pay | Admitting: Neurology

## 2016-05-20 ENCOUNTER — Ambulatory Visit (INDEPENDENT_AMBULATORY_CARE_PROVIDER_SITE_OTHER): Payer: BLUE CROSS/BLUE SHIELD | Admitting: Neurology

## 2016-05-20 VITALS — BP 140/84 | HR 72 | Ht 60.0 in | Wt 109.4 lb

## 2016-05-20 DIAGNOSIS — I951 Orthostatic hypotension: Secondary | ICD-10-CM | POA: Diagnosis not present

## 2016-05-20 DIAGNOSIS — F17200 Nicotine dependence, unspecified, uncomplicated: Secondary | ICD-10-CM | POA: Diagnosis not present

## 2016-05-20 DIAGNOSIS — H8110 Benign paroxysmal vertigo, unspecified ear: Secondary | ICD-10-CM | POA: Diagnosis not present

## 2016-05-20 HISTORY — DX: Orthostatic hypotension: I95.1

## 2016-05-20 NOTE — Patient Instructions (Addendum)
1.  Start vestibular therapy 2.  Stay well hydrated and drink at least 6-8 glasses of 8oz water 3.  You can try meclizine 25mg  twice daily as needed for severe dizziness 4.  Please call [facility] or [facility] office)   Return to clinic as needed

## 2016-05-20 NOTE — Progress Notes (Signed)
[facility] Clinic Note - Initial Visit   Date: 05/20/16  Kara [name] MRN: [id number] DOB: 08-13-1950   Dear Dr. Lorelei Pont:  Thank you for your kind referral of [name] for consultation of vertigo. Although her history is well known to you, please allow [place] to reiterate it for the purpose of our medical record. The patient was accompanied to the clinic by self.   History of Present Illness: Kara [name] is a 66 y.o. right-handed Caucasian female with hemochromatosis, tobacco use, and previous alcohol history presenting for evaluation of dizziness.    Starting around 2016, she began having episodic spells of dizziness.  These spells are usually preceded by a feeling of being unwell, burning eyes, or overall fatigue and then she has a veer sensation to the left which is followed by dizziness.  The spells last <23min and occur 1-3 times per month. Several months ago, she started having these spell and had vomiting.  The following day she went to urgent care and symptoms self-resolved.  Prior to symptoms, she would have burning of her eyes. She usually would try to lay down, put a cold towel on her head, and drink water.   Last month, she had another spell where she felt very dizziness and had to lay on the floor.  It lasted about 20-minutes, before she was able to stand. Usually symptoms of dizziness last about 1 minute. Frequency is 1-3 times per month.  She was diagnosed with vertigo at [facility] and managed with Epley's maneuver.    She also complains of intermittent left arm numbness and weakness, it is very infrequent and she feels that it may have to do with her being on the computer a lot.  Symptoms do not wake her up from sleeping.   She was previously drinking 1 bottle wine daily for 15 years.  She has been sober for the past 6 years.   Out-side paper records, electronic medical record, and images have been reviewed where available and summarized as:  Lab Results    Component Value Date   TSH 1.20 04/28/2016   Lab Results  Component Value Date   HGBA1C 5.4 04/28/2016    Past Medical History:  Diagnosis Date  . Hemochromatosis     Past Surgical History:  Procedure Laterality Date  . NASAL SEPTUM SURGERY       Medications:  No outpatient encounter prescriptions on file as of 05/20/2016.   No facility-administered encounter medications on file as of 05/20/2016.      Allergies:  Allergies  Allergen Reactions  . Penicillins Rash    Family History: Family History  Problem Relation Age of Onset  . Healthy Mother   . Acute myelogenous leukemia Father   . Bipolar disorder Sister   . Diabetes Neg Hx   . Cancer Neg Hx   . Heart failure Neg Hx   . Hyperlipidemia Neg Hx   . Hypertension Neg Hx     Social History: Social History  Substance Use Topics  . Smoking status: Current Every Day Smoker    Packs/day: 1.00  . Smokeless tobacco: Not on file  . Alcohol use No     Comment: former heavy drinker now sober   Social History   Social History Narrative   She is living with mother.    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   Highest level education:  masters    Review of Systems:  CONSTITUTIONAL: No fevers, chills, night  sweats, or weight loss.   EYES: No visual changes or eye pain ENT: No hearing changes.  No history of nose bleeds.   RESPIRATORY: No cough, wheezing and shortness of breath.   CARDIOVASCULAR: Negative for chest pain, and palpitations.   GI: Negative for abdominal discomfort, blood in stools or black stools.  No recent change in bowel habits.   GU:  No history of incontinence.   MUSCLOSKELETAL: No history of joint pain or swelling.  No myalgias.   SKIN: Negative for lesions, rash, and itching.   HEMATOLOGY/ONCOLOGY: Negative for prolonged bleeding, bruising easily, and swollen nodes.  No history of cancer.   ENDOCRINE: Negative for cold or heat intolerance, polydipsia or goiter.   PSYCH:  No  depression or anxiety symptoms.   NEURO: As Above.   Vital Signs:  BP 140/84   Pulse 72   Ht 5' (1.524 m)   Wt 109 lb 6 oz (49.6 kg)   SpO2 99%   BMI 21.36 kg/m  Orthostatic VS for the past 24 hrs:  BP- Lying Pulse- Lying BP- Sitting Pulse- Sitting BP- Standing at 0 minutes Pulse- Standing at 0 minutes  05/20/16 1313 140/84 73 110/74 75 108/70 79   General Medical Exam:   General:  Well appearing, comfortable.   Eyes/ENT: see cranial nerve examination.   Neck: No masses appreciated.  Full range of motion without tenderness.  No carotid bruits. Respiratory:  Clear to auscultation, good air entry bilaterally.   Cardiac:  Regular rate and rhythm, no murmur.   Extremities:  No deformities, edema, or skin discoloration.  Skin:  No rashes or lesions.  Neurological Exam: MENTAL STATUS including orientation to time, place, person, recent and remote memory, attention span and concentration, language, and fund of knowledge is normal.  Speech is not dysarthric.  CRANIAL NERVES: II:  No visual field defects.  Unremarkable fundi.   III-IV-VI: Pupils equal round and reactive to light.  Normal conjugate, extra-ocular eye movements in all directions of gaze.  No nystagmus.  No ptosis.  V:  Normal facial sensation.     VII:  Normal facial symmetry and movements. VIII:  Normal hearing and vestibular function.    Head thrust is negative.  IX-X:  Normal palatal movement.   XI:  Normal shoulder shrug and head rotation.   XII:  Normal tongue strength and range of motion, no deviation or fasciculation.  MOTOR:  No atrophy, fasciculations or abnormal movements.  No pronator drift.  Tone is normal.    Right Upper Extremity:    Left Upper Extremity:    Deltoid  5/5   Deltoid  5/5   Biceps  5/5   Biceps  5/5   Triceps  5/5   Triceps  5/5   Wrist extensors  5/5   Wrist extensors  5/5   Wrist flexors  5/5   Wrist flexors  5/5   Finger extensors  5/5   Finger extensors  5/5   Finger flexors  5/5    Finger flexors  5/5   Dorsal interossei  5/5   Dorsal interossei  5/5   Abductor pollicis  5/5   Abductor pollicis  5/5   Tone (Ashworth scale)  0  Tone (Ashworth scale)  0   Right Lower Extremity:    Left Lower Extremity:    Hip flexors  5/5   Hip flexors  5/5   Hip extensors  5/5   Hip extensors  5/5   Knee flexors  5/5  Knee flexors  5/5   Knee extensors  5/5   Knee extensors  5/5   Dorsiflexors  5/5   Dorsiflexors  5/5   Plantarflexors  5/5   Plantarflexors  5/5   Toe extensors  5/5   Toe extensors  5/5   Toe flexors  5/5   Toe flexors  5/5   Tone (Ashworth scale)  0  Tone (Ashworth scale)  0   MSRs:  Right                                                                 Left brachioradialis 2+  brachioradialis 2+  biceps 2+  biceps 2+  triceps 2+  triceps 2+  patellar 2+  patellar 2+  ankle jerk 1+  ankle jerk 1+  Hoffman no  Hoffman no  plantar response down  plantar response down   SENSORY:  Normal and symmetric perception of light touch, pinprick, vibration, and proprioception.  Tinel's sign is negative at the wrist.  Romberg's sign absent.   COORDINATION/GAIT: Normal finger-to- nose-finger.  Intact rapid alternating movements bilaterally.  Able to rise from a chair without using arms.  Gait narrow based and stable. Tandem and stressed gait intact.    IMPRESSION: 1.  Benign paroxysmal positional vertigo.  No worrisome findings on exam to suggest this is a central lesion.   - Start vestibular therapy  - meclizine 25mg  as needed for acute spells  2.  Orthostatic hypotension, 30 SBP drop when going from supine to sitting, contributing to her lightheadedness.  - encouraged her to stay well hydrated and drink at least 6-8 glasses of 8oz water  3.  Smoking cessation instruction/counseling given:  counseled patient on the dangers of tobacco use, advised patient to stop smoking, and reviewed strategies to maximize success  Return to clinic as needed   The duration of this  appointment visit was 40 minutes of face-to-face time with the patient.  Greater than 50% of this time was spent in counseling, explanation of diagnosis, planning of further management, and coordination of care.   Thank you for allowing me to participate in patient's care.  If I can answer any additional questions, I would be pleased to do so.    Sincerely,    Donika K. Posey Pronto, DO

## 2016-05-24 ENCOUNTER — Other Ambulatory Visit: Payer: Self-pay | Admitting: *Deleted

## 2016-05-24 DIAGNOSIS — H8110 Benign paroxysmal vertigo, unspecified ear: Secondary | ICD-10-CM

## 2016-05-25 ENCOUNTER — Telehealth: Payer: Self-pay | Admitting: Neurology

## 2016-05-25 NOTE — Telephone Encounter (Signed)
Kara [name] [birth year]. She was calling regarding her therapy referral. She was needing the name and #. Her # is [phone]. Thank you

## 2016-05-25 NOTE — Telephone Encounter (Signed)
Called patient back and left message informing her that I sent referral to [facility] and left the number also.

## 2016-05-30 ENCOUNTER — Ambulatory Visit: Payer: Self-pay | Admitting: Family Medicine

## 2016-06-01 ENCOUNTER — Ambulatory Visit (HOSPITAL_BASED_OUTPATIENT_CLINIC_OR_DEPARTMENT_OTHER): Payer: BLUE CROSS/BLUE SHIELD | Admitting: Hematology and Oncology

## 2016-06-01 DIAGNOSIS — R42 Dizziness and giddiness: Secondary | ICD-10-CM

## 2016-06-01 NOTE — Assessment & Plan Note (Signed)
Hereditary hemachromatosis: Pathophysiology: I discussed with the patient extensively about iron absorption and how hemochromatosis needs to excess iron overload. We also discussed the complications resulting from excess iron including the process in a similar carcinoma diabetes bronze discoloration as well as neurological symptoms.  Current lab review reveals iron saturation of 62% and a ferritin of 331. This is highly unusual for someone with hemochromatosis to have such low ferritin levels. She attributes most of these findings to her gluten-free diet.  1. I would like to obtain the genetic testing for hemochromatosis to assess whether she has heterozygous or homozygous for this. 2. plan to do phlebotomy for the next 2 weeks. 3. Recheck CBC iron studies and ferritin to see if it is coming down. We will also assess to see if her symptoms are improving. 4. We can then determine the frequency of her phlebotomy.  Patient has dizziness/vertigo. It is very unclear what her symptoms are. She has seen urology and ENT. Neurology thinks that she has benign positional vertigo. But it appears that she does have slight olfactory hallucinations. I will send a message to Dr. Posey Pronto if she should undergo EEG testing.  Return to clinic in 3 weeks after undergoing 2 phlebotomy treatment so that we can determine the frequency of phlebotomies.

## 2016-06-01 NOTE — Progress Notes (Signed)
[place] CONSULT NOTE  Patient Care Team: No Pcp Per Patient as PCP - General (General Practice)  CHIEF COMPLAINTS/PURPOSE OF CONSULTATION:  Hemochromatosis, establishment of care  HISTORY OF PRESENTING ILLNESS:  Kara [name] 66 y.o. female is here because of long-standing history of hemachromatosis.Patient has a known history of hereditary hemochromatosis. This was diagnosed around year 2000 when she was in [place] and had received phlebotomy. She then subsequently moved to [place] for about 10 years where she had received phlebotomy intermittently through a [facility]. However she had moved back to [facility] about 6 years ago and did not have insurance and because of this she was not able to continue the phlebotomy. She had moved to [facility] 9 months ago because she wanted to live closer to her mother. She now has insurance and she is now interested in receiving hemochromatosis care.  I reviewed her records extensively and collaborated the history with the patient.  MEDICAL HISTORY:  Past Medical History:  Diagnosis Date  . Hemochromatosis     SURGICAL HISTORY: Past Surgical History:  Procedure Laterality Date  . NASAL SEPTUM SURGERY      SOCIAL HISTORY: Social History   Social History  . Marital status: Single    Spouse name: N/A  . Number of children: N/A  . Years of education: N/A   Occupational History  . Not on file.   Social History Main Topics  . Smoking status: Current Every Day Smoker    Packs/day: 1.00  . Smokeless tobacco: Never Used  . Alcohol use No     Comment: former heavy drinker now sober  . Drug use: No  . Sexual activity: Not on file   Other Topics Concern  . Not on file   Social History Narrative   She is living with mother.    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   Highest level education:  masters    FAMILY HISTORY: Family History  Problem Relation Age of Onset  . Healthy  Mother   . Acute myelogenous leukemia Father   . Bipolar disorder Sister   . Diabetes Neg Hx   . Cancer Neg Hx   . Heart failure Neg Hx   . Hyperlipidemia Neg Hx   . Hypertension Neg Hx     ALLERGIES:  is allergic to penicillins.  MEDICATIONS:  No current outpatient prescriptions on file.   No current facility-administered medications for this visit.     REVIEW OF SYSTEMS:   Constitutional: Denies fevers, chills or abnormal night sweats Eyes: Denies blurriness of vision, double vision or watery eyes Ears, nose, mouth, throat, and face: Denies mucositis or sore throat Respiratory: Denies cough, dyspnea or wheezes Cardiovascular: Denies palpitation, chest discomfort or lower extremity swelling Gastrointestinal:  Denies nausea, heartburn or change in bowel habits Skin: Denies abnormal skin rashes Lymphatics: Denies new lymphadenopathy or easy bruising Neurological:Denies numbness, tingling or new weaknesses Behavioral/Psych: Mood is stable, no new changes  All other systems were reviewed with the patient and are negative.  PHYSICAL EXAMINATION: ECOG PERFORMANCE STATUS: 1 - Symptomatic but completely ambulatory  Vitals:   06/01/16 1553  BP: (!) 113/96  Pulse: 80  Resp: 18  Temp: 98.7 F (37.1 C)   Filed Weights   06/01/16 1553  Weight: 110 lb 4.8 oz (50 kg)    GENERAL:alert, no distress and comfortable SKIN: skin color, texture, turgor are normal, no rashes or significant lesions EYES: normal, conjunctiva are pink and  non-injected, sclera clear OROPHARYNX:no exudate, no erythema and lips, buccal mucosa, and tongue normal  NECK: supple, thyroid normal size, non-tender, without nodularity LYMPH:  no palpable lymphadenopathy in the cervical, axillary or inguinal LUNGS: clear to auscultation and percussion with normal breathing effort HEART: regular rate & rhythm and no murmurs and no lower extremity edema ABDOMEN:abdomen soft, non-tender and normal bowel  sounds Musculoskeletal:no cyanosis of digits and no clubbing  PSYCH: alert & oriented x 3 with fluent speech NEURO: no focal motor/sensory deficits BREAST: No palpable nodules in breast. No palpable axillary or supraclavicular lymphadenopathy (exam performed in the presence of a chaperone)   LABORATORY DATA:  I have reviewed the data as listed Lab Results  Component Value Date   WBC 12.4 (H) 04/28/2016   HGB 14.1 04/28/2016   HCT 41.8 04/28/2016   MCV 91.7 04/28/2016   PLT 654.0 (H) 04/28/2016   Lab Results  Component Value Date   NA 138 04/28/2016   K 4.7 04/28/2016   CL 108 04/28/2016   CO2 25 04/28/2016    RADIOGRAPHIC STUDIES: I have personally reviewed the radiological reports and agreed with the findings in the report.  ASSESSMENT AND PLAN:  Hereditary hemochromatosis ([place]) Hereditary hemachromatosis: Pathophysiology: I discussed with the patient extensively about iron absorption and how hemochromatosis needs to excess iron overload. We also discussed the complications resulting from excess iron including the process in a similar carcinoma diabetes bronze discoloration as well as neurological symptoms.  Current lab review reveals iron saturation of 62% and a ferritin of 331. This is highly unusual for someone with hemochromatosis to have such low ferritin levels. She attributes most of these findings to her gluten-free diet.  1. I would like to obtain the genetic testing for hemochromatosis to assess whether she has heterozygous or homozygous for this. 2. plan to do phlebotomy for the next 2 weeks. 3. Recheck CBC iron studies and ferritin to see if it is coming down. We will also assess to see if her symptoms are improving. 4. We can then determine the frequency of her phlebotomy.  Patient has dizziness/vertigo. It is very unclear what her symptoms are. She has seen urology and ENT. Neurology thinks that she has benign positional vertigo. But it appears that she does  have slight olfactory hallucinations. I will send a message to Dr. Posey Pronto if she should undergo EEG testing.  Return to clinic in 3 weeks after undergoing 2 phlebotomy treatment so that we can determine the frequency of phlebotomies.   All questions were answered. The patient knows to call the clinic with any problems, questions or concerns.    Rulon Eisenmenger, MD 06/01/16

## 2016-06-02 ENCOUNTER — Telehealth: Payer: Self-pay | Admitting: *Deleted

## 2016-06-02 NOTE — Telephone Encounter (Signed)
Per voicemail from desk RN I have scheduled appt for the patient. I have called and gave the paitent the date/time

## 2016-06-03 ENCOUNTER — Telehealth: Payer: Self-pay | Admitting: *Deleted

## 2016-06-03 ENCOUNTER — Ambulatory Visit (HOSPITAL_BASED_OUTPATIENT_CLINIC_OR_DEPARTMENT_OTHER): Payer: BLUE CROSS/BLUE SHIELD

## 2016-06-03 ENCOUNTER — Ambulatory Visit: Payer: BLUE CROSS/BLUE SHIELD

## 2016-06-03 NOTE — Telephone Encounter (Signed)
Per 2/14 LOS  I have schedueld appts. Gave calendar

## 2016-06-03 NOTE — Patient Instructions (Signed)
     Therapeutic Phlebotomy, Care After Refer to this sheet in the next few weeks. These instructions provide you with information about caring for yourself after your procedure. Your health care provider may also give you more specific instructions. Your treatment has been planned according to current medical practices, but problems sometimes occur. Call your health care provider if you have any problems or questions after your procedure. What can I expect after the procedure? After the procedure, it is common to have:  Light-headedness or dizziness. You may feel faint.  Nausea.  Tiredness. Follow these instructions at home: Activity  Return to your normal activities as directed by your health care provider. Most people can go back to their normal activities right away.  Avoid strenuous physical activity and heavy lifting or pulling for about 5 hours after the procedure. Do not lift anything that is heavier than 10 lb (4.5 kg).  Athletes should avoid strenuous exercise for at least 12 hours.  Change positions slowly for the remainder of the day. This will help to prevent light-headedness or fainting.  If you feel light-headed, lie down until the feeling goes away. Eating and drinking  Be sure to eat well-balanced meals for the next 24 hours.  Drink enough fluid to keep your urine clear or pale yellow.  Avoid drinking alcohol on the day that you had the procedure. Care of the Needle Insertion Site  Keep your bandage dry. You can remove the bandage after about 5 hours or as directed by your health care provider.  If you have bleeding from the needle insertion site, elevate your arm and press firmly on the site until the bleeding stops.  If you have bruising at the site, apply ice to the area:  Put ice in a plastic bag.  Place a towel between your skin and the bag.  Leave the ice on for 20 minutes, 2-3 times a day for the first 24 hours.  If the swelling does not go away  after 24 hours, apply a warm, moist washcloth to the area for 20 minutes, 2-3 times a day. General instructions  Avoid smoking for at least 30 minutes after the procedure.  Keep all follow-up visits as directed by your health care provider. It is important to continue with further therapeutic phlebotomy treatments as directed. Contact a health care provider if:  You have redness, swelling, or pain at the needle insertion site.  You have fluid, blood, or pus coming from the needle insertion site.  You feel light-headed, dizzy, or nauseated, and the feeling does not go away.  You notice new bruising at the needle insertion site.  You feel weaker than normal.  You have a fever or chills. Get help right away if:  You have severe nausea or vomiting.  You have chest pain.  You have trouble breathing. This information is not intended to replace advice given to you by your health care provider. Make sure you discuss any questions you have with your health care provider. Document Released: 09/06/2010 Document Revised: 12/05/2015 Document Reviewed: 03/31/2014 Elsevier Interactive Patient Education  2017 Elsevier Inc.  

## 2016-06-03 NOTE — Progress Notes (Signed)
therapeutic phlebotomy performed per MD order using 16 gauge phlebotomy set in right AC starting at 1430 and ending at 1438 removing 508 grams. Pt tolerated procedure well. Snack and drink provided. 1510: Pt monitored 30 minutes post procedure. Pt and VS stable at discharge. Pt sent to lab for lab draw and appts scheduled and pt given printed copy of schedule.

## 2016-06-09 ENCOUNTER — Telehealth: Payer: Self-pay

## 2016-06-09 ENCOUNTER — Ambulatory Visit: Payer: BLUE CROSS/BLUE SHIELD | Attending: Neurology | Admitting: Physical Therapy

## 2016-06-09 VITALS — BP 134/100 | HR 75

## 2016-06-09 DIAGNOSIS — R42 Dizziness and giddiness: Secondary | ICD-10-CM | POA: Insufficient documentation

## 2016-06-09 DIAGNOSIS — R2681 Unsteadiness on feet: Secondary | ICD-10-CM

## 2016-06-09 LAB — HEMOCHROMATOSIS DNA-PCR(C282Y,H63D)

## 2016-06-09 NOTE — Telephone Encounter (Signed)
Pt called in to request a referral for pcp and cardiologist. Pt states that she went to see the neurology therapist today to have a vestibular evaluation regarding vertigo. Pt was told that they do not think she has vertigo and could possibly be cardiac related. Pt wanted to see if Dr.Gudena has any suggestions on internist and cardiology referral.

## 2016-06-10 ENCOUNTER — Ambulatory Visit (HOSPITAL_BASED_OUTPATIENT_CLINIC_OR_DEPARTMENT_OTHER): Payer: BLUE CROSS/BLUE SHIELD

## 2016-06-10 ENCOUNTER — Other Ambulatory Visit: Payer: Self-pay

## 2016-06-10 DIAGNOSIS — I951 Orthostatic hypotension: Secondary | ICD-10-CM

## 2016-06-10 DIAGNOSIS — H8110 Benign paroxysmal vertigo, unspecified ear: Secondary | ICD-10-CM

## 2016-06-10 NOTE — Therapy (Signed)
[facility] [address] [place], [place], [address] Phone: [phone]   Fax:  [phone]  Physical Therapy Evaluation  Patient Details  Name: Kara [name] MRN: [id number] Date of Birth: 1950/11/19 Referring Provider: Narda Amber, DO  Encounter Date: 06/09/2016      PT End of Session - 06/10/16 1058    Visit Number 1   Date for PT Re-Evaluation 07/09/16   Authorization Type [employer]   Authorization - Visit Number 1   Authorization - Number of Visits 30   PT Start Time [phone]   PT Stop Time 1020   PT Time Calculation (min) 44 min      Past Medical History:  Diagnosis Date  . Hemochromatosis     Past Surgical History:  Procedure Laterality Date  . NASAL SEPTUM SURGERY      Vitals:   06/09/16 1010  BP: (!) 134/100  Pulse: 75         Subjective Assessment - 06/10/16 1045    Subjective Pt reports major attack occurred in June 2016- has had small episodes since that time: feels that she is going to pass out, but "if I close my eyes I can ground myself"; states it is almost systemic - goes cold:  had episode yesterday - lasted seconds    Pertinent History Hemochromatosis; ocular migraines - most recent one "the other day"; orthostatic hypotension   Patient Stated Goals Resolve the vertigo; prevent episodes from occurring   Currently in Pain? Yes            [facility] PT Assessment - 06/10/16 0001      Assessment   Medical Diagnosis BPPV; Orthostatic hypotension   Referring Provider Narda Amber, DO   Onset Date/Surgical Date --  June 2016     Precautions   Precautions None     Restrictions   Weight Bearing Restrictions No     Balance Screen   Has the patient fallen in the past 6 months No   Has the patient had a decrease in activity level because of a fear of falling?  No   Is the patient reluctant to leave their home because of a fear of falling?  No     Prior Function   Level of Independence  Independent     Ambulation/Gait   Ambulation/Gait Yes   Ambulation/Gait Assistance 7: Independent   Assistive device None   Ambulation Surface Level;Indoor            Vestibular Assessment - 06/10/16 0001      Vestibular Assessment   General Observation Pt is a 66 year old lady with c/o vertigo which has been spontaneous in occurrence and has lasted seconds to minutes.  Pt reports episode approx. 2 months ago that started when she walked into a yarn shop - states she laid down on floor for about 30" til resolved enough for her to be able to ride home - was unable to drive herself home.  Pt reports most recent episode occurred on Wed., 06-08-16 while she was sitting at stoplight (was driving) and states this episode lasted only a minute.  She states that these episodes , minor in severity are now occurring more frequently and she is beginning to get paranoid and anxious with knowing that an episode my occur spontaneously without any warning.     Symptom Behavior   Type of Dizziness Lightheadedness   Frequency of Dizziness varies - intermittent in occurrence   Duration of Dizziness  seconds to minutes   Aggravating Factors No known aggravating factors   Relieving Factors Lying supine;Closing eyes     Occulomotor Exam   Occulomotor Alignment Normal   Smooth Pursuits Intact   Saccades Intact     Positional Testing   Sidelying Test Sidelying Right;Sidelying Left     Sidelying Right   Sidelying Right Duration none   Sidelying Right Symptoms No nystagmus     Sidelying Left   Sidelying Left Duration none   Sidelying Left Symptoms No nystagmus                       PT Education - 06/10/16 1055    Education provided Yes   Education Details advised pt to keep a journal to note any details/characteristics of onset of vertigo; also recommended need for PCP for BP management as pt has high BP reading at time of eval   Person(s) Educated Patient   Methods Explanation    Comprehension Verbalized understanding             PT Long Term Goals - 06/10/16 1109      PT LONG TERM GOAL #1   Title Complete SOT and establish goal as appropriate.  07-09-16   Time 4   Period Weeks   Status New     PT LONG TERM GOAL #2   Title Complete DVA and establish goal as appropriate.  07-09-16   Time 4   Period Weeks   Status New     PT LONG TERM GOAL #3   Title Independent in HEP as appropriate based on further assessment for vertigo.  07-09-16   Time 4   Period Weeks   Status New               Plan - 06/10/16 1059    Clinical Impression Statement Pt is a 66 year old lady with c/o vertigo episodes that have been more frequent in occurrence with minor intensity within past 3 months.  Pt reports history of initial major episode in June 2016 accompanied with nausea and vomiting, but denies an episode of this intensity since that time.  Referring diagnosis is BPPV and orthostatic hypotension; unable to provoke any vertigo and no nystagmus noted with any posiitonal testing during eval.  Pt did have high BP (134/100) during eval and c/o light-headedness with positional changes.  No signs noted to be consistent with BPPV at this time.  BP recorded in standing as 135/96 so no significant drop in BP noted at today's eval.  Will further assess vertigo with SOT to determine use of vestibular system in maintaining balance.  PMH includes ocular migraines, hemochromatosis and orthostatic hypotension.                                                                                                              Rehab Potential Fair   Clinical Impairments Affecting Rehab Potential unknown etiology of vertigo at this time as pt's symptoms are not consistent with BPPV   PT  Frequency 1x / week   PT Duration 4 weeks   PT Treatment/Interventions ADLs/Self Care Home Management;Canalith Repostioning;Gait training;Stair training;Functional mobility training;Patient/family  education;Neuromuscular re-education;Balance training;Therapeutic exercise;Therapeutic activities;Vestibular   PT Next Visit Plan do SOT, DVA, orthostatic hypotension assessment for vertigo   Consulted and Agree with Plan of Care Patient      Patient will benefit from skilled therapeutic intervention in order to improve the following deficits and impairments:  Dizziness  Visit Diagnosis: Dizziness and giddiness - Plan: PT plan of care cert/re-cert  Unsteadiness on feet - Plan: PT plan of care cert/re-cert     Problem List Patient Active Problem List   Diagnosis Date Noted  . Hereditary hemochromatosis ([place]) 06/01/2016  . Benign paroxysmal positional vertigo 05/20/2016  . Orthostatic hypotension 05/20/2016  . Vertigo 04/28/2016  . Screening for hyperlipidemia 04/28/2016    Alda Lea, PT 06/10/2016, 11:15 AM  [facility] [address] [place], [place], [address] Phone: [phone]   Fax:  [phone]  Name: Kara [name] MRN: [id number] Date of Birth: [birth year]

## 2016-06-10 NOTE — Patient Instructions (Signed)
Therapeutic Phlebotomy Therapeutic phlebotomy is the controlled removal of blood from a person's body for the purpose of treating a medical condition. The procedure is similar to donating blood. Usually, about a pint (470 mL, or 0.47L) of blood is removed. The average adult has 9-12 pints (4.3-5.7 L) of blood. Therapeutic phlebotomy may be used to treat the following medical conditions:  Hemochromatosis. This is a condition in which the blood contains too much iron.  Polycythemia vera. This is a condition in which the blood contains too many red blood cells.  Porphyria cutanea tarda. This is a disease in which an important part of hemoglobin is not made properly. It results in the buildup of abnormal amounts of porphyrins in the body.  Sickle cell disease. This is a condition in which the red blood cells form an abnormal crescent shape rather than a round shape. Tell a health care provider about:  Any allergies you have.  All medicines you are taking, including vitamins, herbs, eye drops, creams, and over-the-counter medicines.  Any problems you or family members have had with anesthetic medicines.  Any blood disorders you have.  Any surgeries you have had.  Any medical conditions you have. What are the risks? Generally, this is a safe procedure. However, problems may occur, including:  Nausea or light-headedness.  Low blood pressure.  Soreness, bleeding, swelling, or bruising at the needle insertion site.  Infection. What happens before the procedure?  Follow instructions from your health care provider about eating or drinking restrictions.  Ask your health care provider about changing or stopping your regular medicines. This is especially important if you are taking diabetes medicines or blood thinners.  Wear clothing with sleeves that can be raised above the elbow.  Plan to have someone take you home after the procedure.  You may have a blood sample taken. What happens  during the procedure?  A needle will be inserted into one of your veins.  Tubing and a collection bag will be attached to that needle.  Blood will flow through the needle and tubing into the collection bag.  You may be asked to open and close your hand slowly and continually during the entire collection.  After the specified amount of blood has been removed from your body, the collection bag and tubing will be clamped.  The needle will be removed from your vein.  Pressure will be held on the site of the needle insertion to stop the bleeding.  A bandage (dressing) will be placed over the needle insertion site. The procedure may vary among health care providers and hospitals. What happens after the procedure?  Your recovery will be assessed and monitored.  You can return to your normal activities as directed by your health care provider. This information is not intended to replace advice given to you by your health care provider. Make sure you discuss any questions you have with your health care provider. Document Released: 09/06/2010 Document Revised: 12/05/2015 Document Reviewed: 03/31/2014 Elsevier Interactive Patient Education  2017 Elsevier Inc.  

## 2016-06-10 NOTE — Progress Notes (Signed)
Phlebotomy performed via right AC using phlebotomy kit.  500gm removed over approximately 7 minutes.  Pt tolerated well.  Drink/snack provided.  Pt observed 30 minutes post procedure.

## 2016-06-10 NOTE — Progress Notes (Signed)
Called pt today to let her know that a referral for cardiology and pcp had been requested and sent. Pt very grateful.

## 2016-06-14 ENCOUNTER — Ambulatory Visit: Payer: BLUE CROSS/BLUE SHIELD | Admitting: Physical Therapy

## 2016-06-14 VITALS — BP 135/79

## 2016-06-14 DIAGNOSIS — R42 Dizziness and giddiness: Secondary | ICD-10-CM

## 2016-06-15 NOTE — Therapy (Signed)
[facility] [address] [place], [place], [address] Phone: [phone]   Fax:  [phone]  Physical Therapy Treatment  Patient Details  Name: Kara [name] MRN: [id number] Date of Birth: 18-May-1950 Referring Provider: Narda Amber, DO  Encounter Date: 06/14/2016      PT End of Session - 06/15/16 1722    Visit Number 2   Date for PT Re-Evaluation 07/09/16   Authorization Type [employer]   Authorization - Visit Number 2   Authorization - Number of Visits 30   PT Start Time 7253   PT Stop Time 1446   PT Time Calculation (min) 42 min      Past Medical History:  Diagnosis Date  . Hemochromatosis     Past Surgical History:  Procedure Laterality Date  . NASAL SEPTUM SURGERY      Vitals:   06/14/16 1417  BP: 135/79        Subjective Assessment - 06/15/16 1243    Subjective Pt reports she has not had any attacks of vertigo since initial PT evaluation last week  - "I don't think this is true vertigo"   Pertinent History Hemochromatosis; ocular migraines - most recent one "the other day"; orthostatic hypotension   Patient Stated Goals Resolve the vertigo; prevent episodes from occurring   Currently in Pain? No/denies          Sensory Organization Test - composite score 78/100;  N=68/100  Somatosensory input WNL's Visual input WNL's Vestibular input WNL's     Dynamic visual acuity test 1 line difference (static line 10, dynamic line 9) WNL's  No signs consistent with BPPV at this time and pt reports no vertigo                            PT Long Term Goals - 06/15/16 1722      PT LONG TERM GOAL #1   Title Complete SOT and establish goal as appropriate.  07-09-16   Baseline SOT score WNL's for composite and all sensory inputs including somatosensory, visual and vestibular inputs - 06-14-16   Status Achieved     PT LONG TERM GOAL #2   Title Complete DVA and establish goal as  appropriate.  07-09-16   Baseline 1 line difference - 06-14-16   Status Achieved     PT LONG TERM GOAL #3   Title Independent in HEP as appropriate based on further assessment for vertigo.  07-09-16   Baseline HEP not needed as pt not having vertigo at this time   Status Deferred             Patient will benefit from skilled therapeutic intervention in order to improve the following deficits and impairments:     Visit Diagnosis: Dizziness and giddiness     Problem List Patient Active Problem List   Diagnosis Date Noted  . Hereditary hemochromatosis ([place]) 06/01/2016  . Benign paroxysmal positional vertigo 05/20/2016  . Orthostatic hypotension 05/20/2016  . Vertigo 04/28/2016  . Screening for hyperlipidemia 04/28/2016     PHYSICAL THERAPY DISCHARGE SUMMARY  Visits from Start of Care: 2  Current functional level related to goals / functional outcomes: LTG's met - SOT and DVA tests are WNL's; no vertigo has been provoked with any positional testing in clinic;  No signs or symptoms consistent with BPPV or orthostatic hypotension noted during PT initial eval nor in the 1 follow up session; pt continues to report no  vertigo at this time   Remaining deficits: Pt states she occasionally has vertigo of very short duration (seconds) but this has not been observed in clinic.  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  Vertigo is of unknown etiology at this time and has not occurred during PT sessions.   Education / Equipment: I have educated pt in signs and symptoms of BPPV as well as etiology of this diagnosis; no home exercises needed due to pt not experiencing any vertigo or vestibular system deficits at this time.  Symptoms appear to be non-vestibular in etiology.  Pt may benefit from further diagnostic work up to attempt to determine cause of vertigo. Plan: Patient agrees to  discharge.  Patient goals were met. Patient is being discharged due to meeting the stated rehab goals.  ?????       Alda Lea, PT 06/15/2016, 5:28 PM  [facility] [address] [place], [place], [address] Phone: [phone]   Fax:  [phone]  Name: Kara [name] MRN: [id number] Date of Birth: 04/18/1951

## 2016-06-16 ENCOUNTER — Telehealth: Payer: Self-pay

## 2016-06-16 ENCOUNTER — Telehealth: Payer: Self-pay | Admitting: Neurology

## 2016-06-16 NOTE — Telephone Encounter (Signed)
I called patient and she said that the dizziness is better but something is wrong.  She would like a follow up appointment with you.  When would you like to see her?

## 2016-06-16 NOTE — Telephone Encounter (Signed)
Please call and see how patient's dizziness is doing.  If she is still having spells, I would like to see her back to reassess and see if we need to do additional testing.  Taji Barretto K. Posey Pronto, DO

## 2016-06-16 NOTE — Telephone Encounter (Signed)
Kara [name] will call patient to schedule this appointment.

## 2016-06-16 NOTE — Telephone Encounter (Signed)
Pt called stating that last week May was setting her up for a PCP and cardiology referrals. Pt is calling to let us know that she has not heard from a PCP or cardiologist. Pt did not get any names written down at that time d/t being paged at the time of the conversation.

## 2016-06-16 NOTE — Telephone Encounter (Signed)
Let's have her return on 3/6 at 9:30am.  Hinton Dyer, please add her to my schedule.  Thanks.

## 2016-06-16 NOTE — Telephone Encounter (Signed)
Had sent message to scheduling to place referral for patient. Will follow up and call referral in tomorrow.

## 2016-06-21 ENCOUNTER — Encounter: Payer: Self-pay | Admitting: Neurology

## 2016-06-21 ENCOUNTER — Ambulatory Visit (INDEPENDENT_AMBULATORY_CARE_PROVIDER_SITE_OTHER): Payer: BLUE CROSS/BLUE SHIELD | Admitting: Neurology

## 2016-06-21 VITALS — BP 108/70 | HR 80 | Ht 60.0 in | Wt 112.2 lb

## 2016-06-21 DIAGNOSIS — R29818 Other symptoms and signs involving the nervous system: Secondary | ICD-10-CM | POA: Diagnosis not present

## 2016-06-21 DIAGNOSIS — R292 Abnormal reflex: Secondary | ICD-10-CM

## 2016-06-21 DIAGNOSIS — R42 Dizziness and giddiness: Secondary | ICD-10-CM

## 2016-06-21 NOTE — Patient Instructions (Signed)
1.  Routine EEG 2.  MRI brain wwo contrast  Return to clinic in 3 months

## 2016-06-21 NOTE — Progress Notes (Signed)
Follow-up Visit   Date: 06/21/16    Kara [name] MRN: [id number] DOB: 1950-11-19   Interim History: Kara [name] is a 66 y.o. right-handed Caucasian female with hemochromatosis, tobacco use, and previous alcohol history returning to the clinic for follow-up of dizziness.  The patient was accompanied to the clinic by self.  History of present illness: Starting around 2016, she began having episodic spells of dizziness.  These spells are usually preceded by a feeling of being unwell, burning eyes, or overall fatigue and then she has a veer sensation to the left which is followed by dizziness.  The spells last <54min and occur 1-3 times per month. Several months ago, she started having these spell and had vomiting.  The following day she went to urgent care and symptoms self-resolved.  Prior to symptoms, she would have burning of her eyes. She usually would try to lay down, put a cold towel on her head, and drink water.   Last month, she had another spell where she felt very dizziness and had to lay on the floor.  It lasted about 20-minutes, before she was able to stand. Usually symptoms of dizziness last about 1 minute. Frequency is 1-3 times per month.  She was diagnosed with vertigo at [facility] and managed with Epley's maneuver.    She also complains of intermittent left arm numbness and weakness, it is very infrequent and she feels that it may have to do with her being on the computer a lot.  Symptoms do not wake her up from sleeping.   She was previously drinking 1 bottle wine daily for 15 years.  She has been sober for the past 6 years.   UPDATE 06/21/2016:  She completed neurorehab for vertigo and her therapist did not feel that she has symptoms consistent with BPPV, so recommended that she follow-up with me. She has not had spells of dizziness in the past two weeks.  She saw her hematologist for hemochromatosis to whom she mentioned having intermittent spells of heightened sense of smells,  especially during these dizzy spells.   She also complains of spells of alternating weakness of the arms, lasting 5- 10 minutes.  She calls these "paralysis", but is still able to move the muscles.  If she tries, she is able to work through these spells.  She had one occasion last week, when she wanted to raise her arm, she felt effortless in raising the arm, as if it could fly.  She is very worried about having a neurodegenerative condition.    She denies any tremors, falls, RLS symptoms, bowel/bladder problems, double vision, changes in sweating, or slowed movements.   Medications:  No current outpatient prescriptions on file prior to visit.   No current facility-administered medications on file prior to visit.     Allergies:  Allergies  Allergen Reactions  . Penicillins Rash    Review of Systems:  CONSTITUTIONAL: No fevers, chills, night sweats, or weight loss.  EYES: No visual changes or eye pain ENT: No hearing changes.  No history of nose bleeds.   RESPIRATORY: No cough, wheezing and shortness of breath.   CARDIOVASCULAR: Negative for chest pain, and palpitations.   GI: Negative for abdominal discomfort, blood in stools or black stools.  No recent change in bowel habits.   GU:  No history of incontinence.   MUSCLOSKELETAL: No history of joint pain or swelling.  No myalgias.   SKIN: Negative for lesions, rash, and itching.   ENDOCRINE: Negative for  cold or heat intolerance, polydipsia or goiter.   PSYCH:  No depression +anxiety symptoms.   NEURO: As Above.   Vital Signs:  BP 108/70   Pulse 80   Ht 5' (1.524 m)   Wt 112 lb 4 oz (50.9 kg)   SpO2 97%   BMI 21.92 kg/m      Neurological Exam: MENTAL STATUS including orientation to time, place, person, recent and remote memory, attention span and concentration, language, and fund of knowledge is normal.  Speech is not dysarthric.  CRANIAL NERVES: No visual field defects. Pupils equal round and reactive to light.  Normal  conjugate, extra-ocular eye movements in all directions of gaze.  No ptosis or nystagmus. Face is symmetric. Palate elevates symmetrically.  Tongue is midline.  MOTOR:  Motor strength is 5/5 in all extremities.  No atrophy, fasciculations or abnormal movements.  No pronator drift.  Tone is normal.    MSRs:  Reflexes are brisk and symmetric throughout, except mildly reduced at the ankles bilaterally.   SENSORY:  Intact to vibration throughout.  COORDINATION/GAIT:  Normal finger-to- nose-finger and heel-to-shin.  Intact rapid alternating movements bilaterally.  Gait narrow based and stable. Tandem and stressed gait intact.   Data: Lab Results  Component Value Date   TSH 1.20 04/28/2016   Lab Results  Component Value Date   HGBA1C 5.4 04/28/2016     IMPRESSION/PLAN: Kara [name] is a 66 year-old female returning for evaluation of a constellation of symptoms including spells of dizziness, transient weakness of the arms, changes in smell, and generalized malaise.  At her initial visit, I felt that symptoms of dizziness was most consistent with BPPV given normal exam and rotational features to her history and very brief spells. Fortunately, she has not had any of these spells in the past 2 weeks.  Today, she has new complaints of transient and alternating arm weakness, lasting a few minutes and changes in smell.  Her exam does not show any focal findings. Specifically, there are no findings to suggest Parkinson's disease. With her previous history of orthostasis, multiple system atrophy is considered, however clinically she does not fit this either and lacks dysautonomic features. I will order MRI brain with and without contrast to evaluate for any structural changes.  Although my suspicion is low, routine EEG will be ordered given the stereotyped spells of dizziness. She had many questions which were answered to the best of my ability. The frequency of her symptoms do not suggest TIAs. May consider  extended cardiac monitoring going forward to assess for cardiac arrhythmias.  Return to clinic in 3 months  The duration of this appointment visit was 25 minutes of face-to-face time with the patient.  Greater than 50% of this time was spent in counseling, explanation of diagnosis, planning of further management, and coordination of care.   Thank you for allowing me to participate in patient's care.  If I can answer any additional questions, I would be pleased to do so.    Sincerely,    Ramia Sidney K. Posey Pronto, DO

## 2016-06-22 ENCOUNTER — Telehealth: Payer: Self-pay

## 2016-06-22 NOTE — Telephone Encounter (Signed)
Called pt to give her information about referral to her PCP and cardiology.

## 2016-06-23 ENCOUNTER — Encounter: Payer: Self-pay | Admitting: Hematology and Oncology

## 2016-06-23 ENCOUNTER — Ambulatory Visit: Payer: BLUE CROSS/BLUE SHIELD | Admitting: Physical Therapy

## 2016-06-23 ENCOUNTER — Other Ambulatory Visit (HOSPITAL_BASED_OUTPATIENT_CLINIC_OR_DEPARTMENT_OTHER): Payer: BLUE CROSS/BLUE SHIELD

## 2016-06-23 ENCOUNTER — Ambulatory Visit (HOSPITAL_BASED_OUTPATIENT_CLINIC_OR_DEPARTMENT_OTHER): Payer: BLUE CROSS/BLUE SHIELD | Admitting: Hematology and Oncology

## 2016-06-23 LAB — CBC WITH DIFFERENTIAL/PLATELET
BASO%: 0.4 % (ref 0.0–2.0)
BASOS ABS: 0.1 10*3/uL (ref 0.0–0.1)
EOS%: 2.3 % (ref 0.0–7.0)
Eosinophils Absolute: 0.3 10*3/uL (ref 0.0–0.5)
HCT: 39.1 % (ref 34.8–46.6)
HGB: 13 g/dL (ref 11.6–15.9)
LYMPH%: 22.3 % (ref 14.0–49.7)
MCH: 31.3 pg (ref 25.1–34.0)
MCHC: 33.2 g/dL (ref 31.5–36.0)
MCV: 94 fL (ref 79.5–101.0)
MONO#: 0.8 10*3/uL (ref 0.1–0.9)
MONO%: 6.2 % (ref 0.0–14.0)
NEUT#: 8.5 10*3/uL — ABNORMAL HIGH (ref 1.5–6.5)
NEUT%: 68.8 % (ref 38.4–76.8)
Platelets: 590 10*3/uL — ABNORMAL HIGH (ref 145–400)
RBC: 4.16 10*6/uL (ref 3.70–5.45)
RDW: 16.4 % — ABNORMAL HIGH (ref 11.2–14.5)
WBC: 12.3 10*3/uL — ABNORMAL HIGH (ref 3.9–10.3)
lymph#: 2.7 10*3/uL (ref 0.9–3.3)

## 2016-06-23 NOTE — Progress Notes (Signed)
Patient Care Team: No Pcp Per Patient as PCP - General (General Practice)  DIAGNOSIS:  Encounter Diagnosis  Name Primary?  . Hereditary hemochromatosis ([place])   Treatment: Phlebotomy 06/03/2016 and 06/10/2016  CHIEF COMPLIANT: Follow-up after recent phlebotomies and to review lab results   INTERVAL HISTORY: Kara [name] is a 66 year old with above-mentioned history of hereditary hemochromatosis who underwent 2 episodes of phlebotomies and is here today to discuss the lab results as well as to review the genetic testing that she had performed.  She had tolerated phlebotomy extremely well. She reports that her dizziness has improved over the past 2 weeks. Although today she had an episode of dizziness. She had been to neurology and also to physical therapist who determined that she does not have benign positional vertigo.  REVIEW OF SYSTEMS:   Constitutional: Denies fevers, chills or abnormal weight loss Eyes: Denies blurriness of vision Ears, nose, mouth, throat, and face: Denies mucositis or sore throat Respiratory: Denies cough, dyspnea or wheezes Cardiovascular: Denies palpitation, chest discomfort Gastrointestinal:  Denies nausea, heartburn or change in bowel habits Skin: Denies abnormal skin rashes Lymphatics: Denies new lymphadenopathy or easy bruising Neurological: Dizziness intermittently Behavioral/Psych: Mood is stable, no new changes  Extremities: No lower extremity edema All other systems were reviewed with the patient and are negative.  I have reviewed the past medical history, past surgical history, social history and family history with the patient and they are unchanged from previous note.  ALLERGIES:  is allergic to penicillins.  MEDICATIONS:  No current outpatient prescriptions on file.   No current facility-administered medications for this visit.     PHYSICAL EXAMINATION: ECOG PERFORMANCE STATUS: 1 - Symptomatic but completely ambulatory  Vitals:   06/23/16 1408  BP: 118/90  Pulse: 70  Resp: 18  Temp: 98 F (36.7 C)   Filed Weights   06/23/16 1408  Weight: 110 lb 3.2 oz (50 kg)    GENERAL:alert, no distress and comfortable SKIN: skin color, texture, turgor are normal, no rashes or significant lesions EYES: normal, Conjunctiva are pink and non-injected, sclera clear OROPHARYNX:no exudate, no erythema and lips, buccal mucosa, and tongue normal  NECK: supple, thyroid normal size, non-tender, without nodularity LYMPH:  no palpable lymphadenopathy in the cervical, axillary or inguinal LUNGS: clear to auscultation and percussion with normal breathing effort HEART: regular rate & rhythm and no murmurs and no lower extremity edema ABDOMEN:abdomen soft, non-tender and normal bowel sounds MUSCULOSKELETAL:no cyanosis of digits and no clubbing  NEURO: alert & oriented x 3 with fluent speech, no focal motor/sensory deficits EXTREMITIES: No lower extremity edema  LABORATORY DATA:  I have reviewed the data as listed   Chemistry      Component Value Date/Time   NA 138 04/28/2016 1617   K 4.7 04/28/2016 1617   CL 108 04/28/2016 1617   CO2 25 04/28/2016 1617   BUN 16 04/28/2016 1617   CREATININE 0.53 04/28/2016 1617      Component Value Date/Time   CALCIUM 9.7 04/28/2016 1617   ALKPHOS 66 04/28/2016 1617   AST 18 04/28/2016 1617   ALT 10 04/28/2016 1617   BILITOT 0.3 04/28/2016 1617       Lab Results  Component Value Date   WBC 12.3 (H) 06/23/2016   HGB 13.0 06/23/2016   HCT 39.1 06/23/2016   MCV 94.0 06/23/2016   PLT 590 (H) 06/23/2016   NEUTROABS 8.5 (H) 06/23/2016    ASSESSMENT & PLAN:  Hereditary hemochromatosis (HCC) Hereditary hemachromatosis Genetic testing revealed  homozygous C282Y mutation Current treatment: Phlebotomy with the goal to keep ferritin less than 109 and saturation less than 50%  Lab review: 04/28/2016: Hemoglobin 14.1, platelets 654, iron saturation 67%, ferritin 331 Patient had  dizziness/vertigo as well as olfactory hallucinations She is getting a brain MRI and EEG done.  Treatment plan: Awaiting today's iron studies to determine the frequency of phlebotomy   I spent 25 minutes talking to the patient of which more than half was spent in counseling and coordination of care.  No orders of the defined types were placed in this encounter.  The patient has a good understanding of the overall plan. she agrees with it. she will call with any problems that may develop before the next visit here.   Rulon Eisenmenger, MD 06/23/16

## 2016-06-23 NOTE — Assessment & Plan Note (Signed)
Hereditary hemachromatosis Genetic testing revealed homozygous C282Y mutation Current treatment: Phlebotomy with the goal to keep ferritin less than 109 and saturation less than 50%  Lab review: 04/28/2016: Hemoglobin 14.1, platelets 654, iron saturation 67%, ferritin 331 Patient had dizziness/vertigo as well as olfactory hallucinations  Treatment plan: Awaiting today's iron studies to determine the frequency of phlebotomy

## 2016-06-24 ENCOUNTER — Telehealth: Payer: Self-pay | Admitting: General Practice

## 2016-06-24 ENCOUNTER — Telehealth: Payer: Self-pay | Admitting: Hematology and Oncology

## 2016-06-24 LAB — IRON AND TIBC
%SAT: 47 % (ref 21–57)
Iron: 113 ug/dL (ref 41–142)
TIBC: 243 ug/dL (ref 236–444)
UIBC: 129 ug/dL (ref 120–384)

## 2016-06-24 LAB — FERRITIN: Ferritin: 236 ng/ml (ref 9–269)

## 2016-06-24 NOTE — Telephone Encounter (Signed)
sw pt to confirm 3/13 appt at 0900 per LOS

## 2016-06-24 NOTE — Telephone Encounter (Signed)
Pt called in and would like to know if Dr burns would be willing to take her on as a new pt /

## 2016-06-24 NOTE — Telephone Encounter (Signed)
I am not currently accepting patients

## 2016-06-24 NOTE — Telephone Encounter (Signed)
Please advise as we are closed to taking new pts.

## 2016-06-27 ENCOUNTER — Telehealth: Payer: Self-pay | Admitting: Hematology and Oncology

## 2016-06-27 ENCOUNTER — Other Ambulatory Visit: Payer: BLUE CROSS/BLUE SHIELD

## 2016-06-27 ENCOUNTER — Other Ambulatory Visit: Payer: Self-pay

## 2016-06-27 NOTE — Telephone Encounter (Signed)
Appointments for 3/13 have been canceled just need to reschedule appointments

## 2016-06-27 NOTE — Telephone Encounter (Signed)
LVM to inform patient.  

## 2016-06-28 ENCOUNTER — Ambulatory Visit: Payer: BLUE CROSS/BLUE SHIELD | Admitting: Neurology

## 2016-06-28 ENCOUNTER — Other Ambulatory Visit: Payer: BLUE CROSS/BLUE SHIELD

## 2016-06-28 ENCOUNTER — Telehealth: Payer: Self-pay | Admitting: Hematology and Oncology

## 2016-06-28 NOTE — Telephone Encounter (Signed)
Patient called to reschedule appts due to inclement weather and was rescheduled for lab and Phlebotomy on 07/05/16, date per patient request. Message sent to Dr Lindi Adie.

## 2016-06-30 ENCOUNTER — Ambulatory Visit (INDEPENDENT_AMBULATORY_CARE_PROVIDER_SITE_OTHER): Payer: BLUE CROSS/BLUE SHIELD | Admitting: Neurology

## 2016-06-30 DIAGNOSIS — R42 Dizziness and giddiness: Secondary | ICD-10-CM | POA: Diagnosis not present

## 2016-06-30 DIAGNOSIS — R292 Abnormal reflex: Secondary | ICD-10-CM

## 2016-06-30 DIAGNOSIS — R29818 Other symptoms and signs involving the nervous system: Secondary | ICD-10-CM

## 2016-07-04 ENCOUNTER — Other Ambulatory Visit: Payer: Self-pay

## 2016-07-05 ENCOUNTER — Other Ambulatory Visit (HOSPITAL_BASED_OUTPATIENT_CLINIC_OR_DEPARTMENT_OTHER): Payer: BLUE CROSS/BLUE SHIELD

## 2016-07-05 ENCOUNTER — Ambulatory Visit (HOSPITAL_BASED_OUTPATIENT_CLINIC_OR_DEPARTMENT_OTHER): Payer: BLUE CROSS/BLUE SHIELD

## 2016-07-05 LAB — CBC WITH DIFFERENTIAL/PLATELET
BASO%: 0.6 % (ref 0.0–2.0)
Basophils Absolute: 0.1 10*3/uL (ref 0.0–0.1)
EOS%: 2.6 % (ref 0.0–7.0)
Eosinophils Absolute: 0.3 10*3/uL (ref 0.0–0.5)
HCT: 40.1 % (ref 34.8–46.6)
HGB: 13.8 g/dL (ref 11.6–15.9)
LYMPH%: 26.6 % (ref 14.0–49.7)
MCH: 32.3 pg (ref 25.1–34.0)
MCHC: 34.3 g/dL (ref 31.5–36.0)
MCV: 94.2 fL (ref 79.5–101.0)
MONO#: 0.6 10*3/uL (ref 0.1–0.9)
MONO%: 5.5 % (ref 0.0–14.0)
NEUT%: 64.7 % (ref 38.4–76.8)
NEUTROS ABS: 6.5 10*3/uL (ref 1.5–6.5)
Platelets: 580 10*3/uL — ABNORMAL HIGH (ref 145–400)
RBC: 4.26 10*6/uL (ref 3.70–5.45)
RDW: 16 % — ABNORMAL HIGH (ref 11.2–14.5)
WBC: 10 10*3/uL (ref 3.9–10.3)
lymph#: 2.7 10*3/uL (ref 0.9–3.3)

## 2016-07-05 NOTE — Patient Instructions (Signed)
     Therapeutic Phlebotomy, Care After Refer to this sheet in the next few weeks. These instructions provide you with information about caring for yourself after your procedure. Your health care provider may also give you more specific instructions. Your treatment has been planned according to current medical practices, but problems sometimes occur. Call your health care provider if you have any problems or questions after your procedure. What can I expect after the procedure? After the procedure, it is common to have:  Light-headedness or dizziness. You may feel faint.  Nausea.  Tiredness. Follow these instructions at home: Activity  Return to your normal activities as directed by your health care provider. Most people can go back to their normal activities right away.  Avoid strenuous physical activity and heavy lifting or pulling for about 5 hours after the procedure. Do not lift anything that is heavier than 10 lb (4.5 kg).  Athletes should avoid strenuous exercise for at least 12 hours.  Change positions slowly for the remainder of the day. This will help to prevent light-headedness or fainting.  If you feel light-headed, lie down until the feeling goes away. Eating and drinking  Be sure to eat well-balanced meals for the next 24 hours.  Drink enough fluid to keep your urine clear or pale yellow.  Avoid drinking alcohol on the day that you had the procedure. Care of the Needle Insertion Site  Keep your bandage dry. You can remove the bandage after about 5 hours or as directed by your health care provider.  If you have bleeding from the needle insertion site, elevate your arm and press firmly on the site until the bleeding stops.  If you have bruising at the site, apply ice to the area:  Put ice in a plastic bag.  Place a towel between your skin and the bag.  Leave the ice on for 20 minutes, 2-3 times a day for the first 24 hours.  If the swelling does not go away  after 24 hours, apply a warm, moist washcloth to the area for 20 minutes, 2-3 times a day. General instructions  Avoid smoking for at least 30 minutes after the procedure.  Keep all follow-up visits as directed by your health care provider. It is important to continue with further therapeutic phlebotomy treatments as directed. Contact a health care provider if:  You have redness, swelling, or pain at the needle insertion site.  You have fluid, blood, or pus coming from the needle insertion site.  You feel light-headed, dizzy, or nauseated, and the feeling does not go away.  You notice new bruising at the needle insertion site.  You feel weaker than normal.  You have a fever or chills. Get help right away if:  You have severe nausea or vomiting.  You have chest pain.  You have trouble breathing. This information is not intended to replace advice given to you by your health care provider. Make sure you discuss any questions you have with your health care provider. Document Released: 09/06/2010 Document Revised: 12/05/2015 Document Reviewed: 03/31/2014 Elsevier Interactive Patient Education  2017 Elsevier Inc.  

## 2016-07-05 NOTE — Progress Notes (Signed)
Phlebotomy performed due to ferritin of 263 from 06/23/16. Phlebotomy started at 1521 and ended at 1558 with 516grams phlebotomized. Patient tolerated well. Snack and drink provided. 30 min observation started.   Wylene Simmer, BSN, RN 07/05/2016 4:07 PM  Patient discharged with stable VS and no complaints.   Wylene Simmer, BSN, RN 07/05/2016 4:36 PM

## 2016-07-06 ENCOUNTER — Telehealth: Payer: Self-pay | Admitting: Neurology

## 2016-07-06 ENCOUNTER — Telehealth: Payer: Self-pay | Admitting: Hematology and Oncology

## 2016-07-06 LAB — IRON AND TIBC
%SAT: 69 % — AB (ref 21–57)
IRON: 158 ug/dL — AB (ref 41–142)
TIBC: 230 ug/dL — AB (ref 236–444)
UIBC: 72 ug/dL — AB (ref 120–384)

## 2016-07-06 LAB — FERRITIN: FERRITIN: 226 ng/mL (ref 9–269)

## 2016-07-06 NOTE — Telephone Encounter (Signed)
PT left a message that she has not heard the results of her EEG and also to let Dr Posey Pronto know that she had another attack last night/Dawn

## 2016-07-06 NOTE — Telephone Encounter (Signed)
sw pt to confirm r/s appt to 4/4 at 1030 am per LOS

## 2016-07-06 NOTE — Telephone Encounter (Signed)
Patient made aware.

## 2016-07-06 NOTE — Procedures (Signed)
ELECTROENCEPHALOGRAM REPORT  Date of Study: 06/30/2016  Patient's Name: Kara [name] MRN: [id number] Date of Birth: 07/08/1950  Referring Provider: Dr. Narda Amber  Clinical History: This is a 66 year old woman with episodic dizziness.  Medications: none  Technical Summary: A multichannel digital EEG recording measured by the international 10-20 system with electrodes applied with paste and impedances below 5000 ohms performed in our laboratory with EKG monitoring in an awake and asleep patient.  Hyperventilation and photic stimulation were performed.  The digital EEG was referentially recorded, reformatted, and digitally filtered in a variety of bipolar and referential montages for optimal display.    Description: The patient is awake and asleep during the recording.  During maximal wakefulness, there is a symmetric, medium voltage 10.5 Hz posterior dominant rhythm that attenuates with eye opening.  The record is symmetric.  During drowsiness and sleep, there is an increase in theta slowing of the background with shifting asymmetry over the bilateral temporal regions, at times sharply contoured without clear epileptogenic potential.  Vertex waves and symmetric sleep spindles were seen.  Hyperventilation and photic stimulation did not elicit any abnormalities.  There were no epileptiform discharges or electrographic seizures seen.    EKG lead was unremarkable.  Impression: This awake and asleep EEG is within normal limits for age.  Clinical Correlation: A normal EEG does not exclude a clinical diagnosis of epilepsy.  If further clinical questions remain, prolonged EEG may be helpful.  Clinical correlation is advised.   Ellouise Newer, M.D.

## 2016-07-06 NOTE — Telephone Encounter (Signed)
Patient made aware EEG normal.   Enquired about "attack" patient had last night. She states that she was sitting down watching TV and got double vision for about 4 minutes with left eye being blurry.  Also, She states that during her EEG the strobe type light seemed like it was on the left even though it was right in front of her. She is going to call and go ahead with MR.  Please advise if any other advise prior to getting this result.

## 2016-07-06 NOTE — Telephone Encounter (Signed)
Noted. No other recommendations. MRI brian scheduled for 4/1 and will discuss results with her when available.  Donika K. Posey Pronto, DO

## 2016-07-06 NOTE — Telephone Encounter (Signed)
Tried to call patient back and phone connection was bad.

## 2016-07-07 ENCOUNTER — Telehealth: Payer: Self-pay | Admitting: *Deleted

## 2016-07-07 NOTE — Telephone Encounter (Signed)
Patient notified

## 2016-07-07 NOTE — Telephone Encounter (Signed)
-----   Message from Alda Berthold, DO sent at 07/06/2016 11:10 AM EDT ----- Please inform patient that EEG is normal.  No evidence of abnormal brain activity.

## 2016-07-12 ENCOUNTER — Other Ambulatory Visit: Payer: BLUE CROSS/BLUE SHIELD

## 2016-07-17 ENCOUNTER — Ambulatory Visit
Admission: RE | Admit: 2016-07-17 | Discharge: 2016-07-17 | Disposition: A | Discharge status: Home / Self Care | Payer: BLUE CROSS/BLUE SHIELD | Source: Ambulatory Visit | Attending: Neurology | Admitting: Neurology

## 2016-07-17 ENCOUNTER — Other Ambulatory Visit: Payer: BLUE CROSS/BLUE SHIELD

## 2016-07-17 DIAGNOSIS — R42 Dizziness and giddiness: Secondary | ICD-10-CM

## 2016-07-17 DIAGNOSIS — R292 Abnormal reflex: Secondary | ICD-10-CM

## 2016-07-17 DIAGNOSIS — R29818 Other symptoms and signs involving the nervous system: Secondary | ICD-10-CM

## 2016-07-17 MED ORDER — GADOBENATE DIMEGLUMINE 529 MG/ML IV SOLN
10.0000 mL | Freq: Once | INTRAVENOUS | Status: AC | PRN
  Administered 2016-07-17: 10 mL via INTRAVENOUS

## 2016-07-19 ENCOUNTER — Other Ambulatory Visit: Payer: Self-pay

## 2016-07-19 ENCOUNTER — Telehealth: Payer: Self-pay | Admitting: *Deleted

## 2016-07-19 DIAGNOSIS — R42 Dizziness and giddiness: Secondary | ICD-10-CM

## 2016-07-19 DIAGNOSIS — H8110 Benign paroxysmal vertigo, unspecified ear: Secondary | ICD-10-CM

## 2016-07-19 NOTE — Telephone Encounter (Signed)
Patient notified but wants referral to cardiology.

## 2016-07-19 NOTE — Telephone Encounter (Signed)
-----   Message from Alda Berthold, DO sent at 07/19/2016  1:45 PM EDT ----- Please inform patient that MRI brain is normal.  Thanks.

## 2016-07-19 NOTE — Progress Notes (Signed)
Pt states that she called [name]'s office and only takes acute cases. Pt wants a referral to Dr.Jordan's office, which is pt's mother's cardiologist. Will place a referral out for his office. Pt also unable to get a pcp appt due to referred dr, currently not taking in new patients at this time. Gave pt a different location ([facility]) that are taking new patients at this time. Pt will call them tomorrow to get in an appointment soon.

## 2016-07-19 NOTE — Telephone Encounter (Signed)
We can order 30-day extended cardiac monitor and see what the results show. Recommend she discuss cardiology referral with PCP. She is welcome to schedule return visit to discuss further.   Donika K. Posey Pronto, DO

## 2016-07-20 ENCOUNTER — Other Ambulatory Visit (HOSPITAL_BASED_OUTPATIENT_CLINIC_OR_DEPARTMENT_OTHER): Payer: BLUE CROSS/BLUE SHIELD

## 2016-07-20 ENCOUNTER — Other Ambulatory Visit: Payer: Self-pay | Admitting: *Deleted

## 2016-07-20 ENCOUNTER — Ambulatory Visit (HOSPITAL_BASED_OUTPATIENT_CLINIC_OR_DEPARTMENT_OTHER): Payer: BLUE CROSS/BLUE SHIELD

## 2016-07-20 DIAGNOSIS — I959 Hypotension, unspecified: Secondary | ICD-10-CM

## 2016-07-20 DIAGNOSIS — R42 Dizziness and giddiness: Secondary | ICD-10-CM

## 2016-07-20 LAB — CBC WITH DIFFERENTIAL/PLATELET
BASO%: 1 % (ref 0.0–2.0)
Basophils Absolute: 0.1 10*3/uL (ref 0.0–0.1)
EOS ABS: 0.3 10*3/uL (ref 0.0–0.5)
EOS%: 2.5 % (ref 0.0–7.0)
HCT: 40 % (ref 34.8–46.6)
HEMOGLOBIN: 13.8 g/dL (ref 11.6–15.9)
LYMPH%: 24.9 % (ref 14.0–49.7)
MCH: 32.7 pg (ref 25.1–34.0)
MCHC: 34.5 g/dL (ref 31.5–36.0)
MCV: 94.8 fL (ref 79.5–101.0)
MONO#: 0.7 10*3/uL (ref 0.1–0.9)
MONO%: 6.6 % (ref 0.0–14.0)
NEUT%: 65 % (ref 38.4–76.8)
NEUTROS ABS: 6.4 10*3/uL (ref 1.5–6.5)
PLATELETS: 571 10*3/uL — AB (ref 145–400)
RBC: 4.22 10*6/uL (ref 3.70–5.45)
RDW: 15.8 % — AB (ref 11.2–14.5)
WBC: 9.9 10*3/uL (ref 3.9–10.3)
lymph#: 2.5 10*3/uL (ref 0.9–3.3)

## 2016-07-20 LAB — IRON AND TIBC
%SAT: 89 % — AB (ref 21–57)
Iron: 193 ug/dL — ABNORMAL HIGH (ref 41–142)
TIBC: 218 ug/dL — ABNORMAL LOW (ref 236–444)
UIBC: 25 ug/dL — AB (ref 120–384)

## 2016-07-20 LAB — FERRITIN: Ferritin: 193 ng/ml (ref 9–269)

## 2016-07-20 NOTE — Patient Instructions (Signed)

## 2016-07-20 NOTE — Telephone Encounter (Signed)
Patient given instructions.  Referral placed to [facility].

## 2016-07-22 ENCOUNTER — Telehealth: Payer: Self-pay | Admitting: Hematology and Oncology

## 2016-07-22 ENCOUNTER — Other Ambulatory Visit: Payer: Self-pay | Admitting: Emergency Medicine

## 2016-07-22 NOTE — Telephone Encounter (Signed)
[facility]. Dr. Gwenlyn Found  08/17/2016 @ 1:20 pm  Spoke with Romie Minus (1st available physician)  Patient declined appointment previously scheduled with Dalene Seltzer to see Octaviano Batty, Dr. Doug Sou assistant. Due to Dr. Doug Sou next available appointment in not until end of May patient requested to be scheduled with the 1st available cardiologist.   Patient given appointment date/time/location and phone number.

## 2016-07-25 ENCOUNTER — Other Ambulatory Visit (HOSPITAL_BASED_OUTPATIENT_CLINIC_OR_DEPARTMENT_OTHER): Payer: BLUE CROSS/BLUE SHIELD

## 2016-07-25 ENCOUNTER — Encounter: Payer: Self-pay | Admitting: Hematology and Oncology

## 2016-07-25 ENCOUNTER — Telehealth: Payer: Self-pay | Admitting: Hematology and Oncology

## 2016-07-25 ENCOUNTER — Ambulatory Visit (HOSPITAL_BASED_OUTPATIENT_CLINIC_OR_DEPARTMENT_OTHER): Payer: BLUE CROSS/BLUE SHIELD | Admitting: Hematology and Oncology

## 2016-07-25 LAB — CBC WITH DIFFERENTIAL/PLATELET
BASO%: 0.3 % (ref 0.0–2.0)
Basophils Absolute: 0 10*3/uL (ref 0.0–0.1)
EOS%: 1.6 % (ref 0.0–7.0)
Eosinophils Absolute: 0.2 10*3/uL (ref 0.0–0.5)
HCT: 36.7 % (ref 34.8–46.6)
HGB: 12.3 g/dL (ref 11.6–15.9)
LYMPH%: 30.5 % (ref 14.0–49.7)
MCH: 32 pg (ref 25.1–34.0)
MCHC: 33.5 g/dL (ref 31.5–36.0)
MCV: 95.6 fL (ref 79.5–101.0)
MONO#: 0.7 10*3/uL (ref 0.1–0.9)
MONO%: 6.3 % (ref 0.0–14.0)
NEUT#: 6.4 10*3/uL (ref 1.5–6.5)
NEUT%: 61.3 % (ref 38.4–76.8)
Platelets: 594 10*3/uL — ABNORMAL HIGH (ref 145–400)
RBC: 3.84 10*6/uL (ref 3.70–5.45)
RDW: 15.2 % — ABNORMAL HIGH (ref 11.2–14.5)
WBC: 10.5 10*3/uL — ABNORMAL HIGH (ref 3.9–10.3)
lymph#: 3.2 10*3/uL (ref 0.9–3.3)

## 2016-07-25 LAB — FERRITIN: FERRITIN: 142 ng/mL (ref 9–269)

## 2016-07-25 LAB — IRON AND TIBC
%SAT: 76 % — ABNORMAL HIGH (ref 21–57)
Iron: 174 ug/dL — ABNORMAL HIGH (ref 41–142)
TIBC: 230 ug/dL — ABNORMAL LOW (ref 236–444)
UIBC: 56 ug/dL — ABNORMAL LOW (ref 120–384)

## 2016-07-25 NOTE — Telephone Encounter (Signed)
Per 4/9 sch msg schedule phleb this week for Wednesday and weekly times 4. Per infusion supervisor not able to add for this Wednesday. Left message for patient asking that she call me re which day she can come in this week (Thursday or Friday) for phleb.

## 2016-07-25 NOTE — Progress Notes (Signed)
Patient Care Team: No Pcp Per Patient as PCP - General (General Practice)  DIAGNOSIS:  Encounter Diagnosis  Name Primary?  . Hereditary hemochromatosis ([place])    Current treatment: Phlebotomy previously every 2 weeks now weekly CHIEF COMPLIANT: Follow-up to discuss her symptoms related to diplopia and dizziness  INTERVAL HISTORY: Kara [name] is a 66 year old with above-mentioned history of hereditary hemochromatosis is currently on phlebotomy every 2 weeks is here today to discuss the lab work. She reports to me that she had one episode of diplopia that lasted 5 minutes. This makes her very anxious and worried. She is planning to go to [place] once a month.  REVIEW OF SYSTEMS:   Constitutional: Denies fevers, chills or abnormal weight loss Eyes: Denies blurriness of vision Ears, nose, mouth, throat, and face: Denies mucositis or sore throat Respiratory: Denies cough, dyspnea or wheezes Cardiovascular: Denies palpitation, chest discomfort Gastrointestinal:  Denies nausea, heartburn or change in bowel habits Skin: Denies abnormal skin rashes Lymphatics: Denies new lymphadenopathy or easy bruising Neurological:Denies numbness, tingling or new weaknesses Behavioral/Psych: Mood is stable, no new changes  Extremities: No lower extremity edema  All other systems were reviewed with the patient and are negative.  I have reviewed the past medical history, past surgical history, social history and family history with the patient and they are unchanged from previous note.  ALLERGIES:  is allergic to penicillins.  MEDICATIONS:  No current outpatient prescriptions on file.   No current facility-administered medications for this visit.     PHYSICAL EXAMINATION: ECOG PERFORMANCE STATUS: 1 - Symptomatic but completely ambulatory  Vitals:   07/25/16 1152  BP: 104/61  Pulse: 76  Resp: 18  Temp: 97.7 F (36.5 C)   Filed Weights   07/25/16 1152  Weight: 106 lb 14.4 oz (48.5 kg)     GENERAL:alert, no distress and comfortable SKIN: skin color, texture, turgor are normal, no rashes or significant lesions EYES: normal, Conjunctiva are pink and non-injected, sclera clear OROPHARYNX:no exudate, no erythema and lips, buccal mucosa, and tongue normal  NECK: supple, thyroid normal size, non-tender, without nodularity LYMPH:  no palpable lymphadenopathy in the cervical, axillary or inguinal LUNGS: clear to auscultation and percussion with normal breathing effort HEART: regular rate & rhythm and no murmurs and no lower extremity edema ABDOMEN:abdomen soft, non-tender and normal bowel sounds MUSCULOSKELETAL:no cyanosis of digits and no clubbing  NEURO: alert & oriented x 3 with fluent speech, no focal motor/sensory deficits EXTREMITIES: No lower extremity edema  LABORATORY DATA:  I have reviewed the data as listed   Chemistry      Component Value Date/Time   NA 138 04/28/2016 1617   K 4.7 04/28/2016 1617   CL 108 04/28/2016 1617   CO2 25 04/28/2016 1617   BUN 16 04/28/2016 1617   CREATININE 0.53 04/28/2016 1617      Component Value Date/Time   CALCIUM 9.7 04/28/2016 1617   ALKPHOS 66 04/28/2016 1617   AST 18 04/28/2016 1617   ALT 10 04/28/2016 1617   BILITOT 0.3 04/28/2016 1617       Lab Results  Component Value Date   WBC 10.5 (H) 07/25/2016   HGB 12.3 07/25/2016   HCT 36.7 07/25/2016   MCV 95.6 07/25/2016   PLT 594 (H) 07/25/2016   NEUTROABS 6.4 07/25/2016    ASSESSMENT & PLAN:  Hereditary hemochromatosis (HCC) Hereditary hemachromatosis Genetic testing revealed homozygous C282Y mutation Current treatment: Phlebotomy with the goal to keep ferritin less than 109 and saturation less than  50%  Lab review: 07/20/2016: Hemoglobin  13.8, platelets 571, iron saturation 89 %, ferritin 193 It is strange that the iron saturation has increased in spite of phlebotomy. Patient had dizziness/vertigo as well as olfactory hallucinations As well as recent episode  of diplopia  Brain MRI 07/17/2016: No acute intracranial abnormality. Chronic white matter changes likely related to microvascular ischemia.  Treatment plan: Patient was receiving phlebotomy every 2 weeks. Last phlebotomy was on 07/20/2016. I recommended changing the phlebotomy schedule to once a week in order to decrease iron saturation more rapidly. Return to clinic in 5 weeks for follow-up  I spent 25 minutes talking to the patient of which more than half was spent in counseling and coordination of care.  No orders of the defined types were placed in this encounter.  The patient has a good understanding of the overall plan. she agrees with it. she will call with any problems that may develop before the next visit here.   Rulon Eisenmenger, MD 07/25/16

## 2016-07-25 NOTE — Assessment & Plan Note (Signed)
Hereditary hemachromatosis Genetic testing revealed homozygous C282Y mutation Current treatment: Phlebotomy with the goal to keep ferritin less than 109 and saturation less than 50%  Lab review: 07/20/2016: Hemoglobin  13.8, platelets 571, iron saturation 89 %, ferritin 193 Patient had dizziness/vertigo as well as olfactory hallucinations Brain MRI 07/17/2016: No acute intracranial abnormality. Chronic white matter changes likely related to microvascular ischemia.  Treatment plan: Patient was receiving phlebotomy every 2 weeks. Last phlebotomy was on 07/20/2016. I recommended changing the phlebotomy schedule to once a week in order to decrease iron saturation more rapidly.

## 2016-07-27 ENCOUNTER — Ambulatory Visit (HOSPITAL_BASED_OUTPATIENT_CLINIC_OR_DEPARTMENT_OTHER): Payer: BLUE CROSS/BLUE SHIELD

## 2016-07-27 NOTE — Patient Instructions (Signed)
Therapeutic Phlebotomy Therapeutic phlebotomy is the controlled removal of blood from a person's body for the purpose of treating a medical condition. The procedure is similar to donating blood. Usually, about a pint (470 mL, or 0.47L) of blood is removed. The average adult has 9-12 pints (4.3-5.7 L) of blood. Therapeutic phlebotomy may be used to treat the following medical conditions:  Hemochromatosis. This is a condition in which the blood contains too much iron.  Polycythemia vera. This is a condition in which the blood contains too many red blood cells.  Porphyria cutanea tarda. This is a disease in which an important part of hemoglobin is not made properly. It results in the buildup of abnormal amounts of porphyrins in the body.  Sickle cell disease. This is a condition in which the red blood cells form an abnormal crescent shape rather than a round shape. Tell a health care provider about:  Any allergies you have.  All medicines you are taking, including vitamins, herbs, eye drops, creams, and over-the-counter medicines.  Any problems you or family members have had with anesthetic medicines.  Any blood disorders you have.  Any surgeries you have had.  Any medical conditions you have. What are the risks? Generally, this is a safe procedure. However, problems may occur, including:  Nausea or light-headedness.  Low blood pressure.  Soreness, bleeding, swelling, or bruising at the needle insertion site.  Infection. What happens before the procedure?  Follow instructions from your health care provider about eating or drinking restrictions.  Ask your health care provider about changing or stopping your regular medicines. This is especially important if you are taking diabetes medicines or blood thinners.  Wear clothing with sleeves that can be raised above the elbow.  Plan to have someone take you home after the procedure.  You may have a blood sample taken. What happens  during the procedure?  A needle will be inserted into one of your veins.  Tubing and a collection bag will be attached to that needle.  Blood will flow through the needle and tubing into the collection bag.  You may be asked to open and close your hand slowly and continually during the entire collection.  After the specified amount of blood has been removed from your body, the collection bag and tubing will be clamped.  The needle will be removed from your vein.  Pressure will be held on the site of the needle insertion to stop the bleeding.  A bandage (dressing) will be placed over the needle insertion site. The procedure may vary among health care providers and hospitals. What happens after the procedure?  Your recovery will be assessed and monitored.  You can return to your normal activities as directed by your health care provider. This information is not intended to replace advice given to you by your health care provider. Make sure you discuss any questions you have with your health care provider. Document Released: 09/06/2010 Document Revised: 12/05/2015 Document Reviewed: 03/31/2014 Elsevier Interactive Patient Education  2017 Elsevier Inc.  

## 2016-07-27 NOTE — Progress Notes (Signed)
Patient monitored for 30 minutes post phlebotomy. Patient and vital signs stable upon discharge.

## 2016-07-27 NOTE — Progress Notes (Signed)
540g removed from right forearm. Patient provided with snacks/drinks before procedure. 30 minute observation in progress. Report given to late nurse.

## 2016-07-28 ENCOUNTER — Ambulatory Visit: Payer: BLUE CROSS/BLUE SHIELD | Admitting: Neurology

## 2016-07-28 DIAGNOSIS — Z029 Encounter for administrative examinations, unspecified: Secondary | ICD-10-CM

## 2016-07-29 ENCOUNTER — Encounter: Payer: Self-pay | Admitting: Neurology

## 2016-08-01 ENCOUNTER — Ambulatory Visit: Payer: BLUE CROSS/BLUE SHIELD | Admitting: Physician Assistant

## 2016-08-03 ENCOUNTER — Ambulatory Visit (HOSPITAL_BASED_OUTPATIENT_CLINIC_OR_DEPARTMENT_OTHER): Payer: BLUE CROSS/BLUE SHIELD

## 2016-08-03 NOTE — Patient Instructions (Signed)
     Therapeutic Phlebotomy, Care After Refer to this sheet in the next few weeks. These instructions provide you with information about caring for yourself after your procedure. Your health care provider may also give you more specific instructions. Your treatment has been planned according to current medical practices, but problems sometimes occur. Call your health care provider if you have any problems or questions after your procedure. What can I expect after the procedure? After the procedure, it is common to have:  Light-headedness or dizziness. You may feel faint.  Nausea.  Tiredness. Follow these instructions at home: Activity  Return to your normal activities as directed by your health care provider. Most people can go back to their normal activities right away.  Avoid strenuous physical activity and heavy lifting or pulling for about 5 hours after the procedure. Do not lift anything that is heavier than 10 lb (4.5 kg).  Athletes should avoid strenuous exercise for at least 12 hours.  Change positions slowly for the remainder of the day. This will help to prevent light-headedness or fainting.  If you feel light-headed, lie down until the feeling goes away. Eating and drinking  Be sure to eat well-balanced meals for the next 24 hours.  Drink enough fluid to keep your urine clear or pale yellow.  Avoid drinking alcohol on the day that you had the procedure. Care of the Needle Insertion Site  Keep your bandage dry. You can remove the bandage after about 5 hours or as directed by your health care provider.  If you have bleeding from the needle insertion site, elevate your arm and press firmly on the site until the bleeding stops.  If you have bruising at the site, apply ice to the area:  Put ice in a plastic bag.  Place a towel between your skin and the bag.  Leave the ice on for 20 minutes, 2-3 times a day for the first 24 hours.  If the swelling does not go away  after 24 hours, apply a warm, moist washcloth to the area for 20 minutes, 2-3 times a day. General instructions  Avoid smoking for at least 30 minutes after the procedure.  Keep all follow-up visits as directed by your health care provider. It is important to continue with further therapeutic phlebotomy treatments as directed. Contact a health care provider if:  You have redness, swelling, or pain at the needle insertion site.  You have fluid, blood, or pus coming from the needle insertion site.  You feel light-headed, dizzy, or nauseated, and the feeling does not go away.  You notice new bruising at the needle insertion site.  You feel weaker than normal.  You have a fever or chills. Get help right away if:  You have severe nausea or vomiting.  You have chest pain.  You have trouble breathing. This information is not intended to replace advice given to you by your health care provider. Make sure you discuss any questions you have with your health care provider. Document Released: 09/06/2010 Document Revised: 12/05/2015 Document Reviewed: 03/31/2014 Elsevier Interactive Patient Education  2017 Elsevier Inc.  

## 2016-08-03 NOTE — Progress Notes (Signed)
Phlebotomy. 500 gm pulled from patient with 18g PIV to left AC. Patient given snack and encouraged fluids. Observed for 30 minutes afterwards. Vital Signs stable.

## 2016-08-10 ENCOUNTER — Ambulatory Visit (HOSPITAL_BASED_OUTPATIENT_CLINIC_OR_DEPARTMENT_OTHER): Payer: BLUE CROSS/BLUE SHIELD

## 2016-08-10 NOTE — Patient Instructions (Signed)
     Therapeutic Phlebotomy, Care After Refer to this sheet in the next few weeks. These instructions provide you with information about caring for yourself after your procedure. Your health care provider may also give you more specific instructions. Your treatment has been planned according to current medical practices, but problems sometimes occur. Call your health care provider if you have any problems or questions after your procedure. What can I expect after the procedure? After the procedure, it is common to have:  Light-headedness or dizziness. You may feel faint.  Nausea.  Tiredness. Follow these instructions at home: Activity  Return to your normal activities as directed by your health care provider. Most people can go back to their normal activities right away.  Avoid strenuous physical activity and heavy lifting or pulling for about 5 hours after the procedure. Do not lift anything that is heavier than 10 lb (4.5 kg).  Athletes should avoid strenuous exercise for at least 12 hours.  Change positions slowly for the remainder of the day. This will help to prevent light-headedness or fainting.  If you feel light-headed, lie down until the feeling goes away. Eating and drinking  Be sure to eat well-balanced meals for the next 24 hours.  Drink enough fluid to keep your urine clear or pale yellow.  Avoid drinking alcohol on the day that you had the procedure. Care of the Needle Insertion Site  Keep your bandage dry. You can remove the bandage after about 5 hours or as directed by your health care provider.  If you have bleeding from the needle insertion site, elevate your arm and press firmly on the site until the bleeding stops.  If you have bruising at the site, apply ice to the area:  Put ice in a plastic bag.  Place a towel between your skin and the bag.  Leave the ice on for 20 minutes, 2-3 times a day for the first 24 hours.  If the swelling does not go away  after 24 hours, apply a warm, moist washcloth to the area for 20 minutes, 2-3 times a day. General instructions  Avoid smoking for at least 30 minutes after the procedure.  Keep all follow-up visits as directed by your health care provider. It is important to continue with further therapeutic phlebotomy treatments as directed. Contact a health care provider if:  You have redness, swelling, or pain at the needle insertion site.  You have fluid, blood, or pus coming from the needle insertion site.  You feel light-headed, dizzy, or nauseated, and the feeling does not go away.  You notice new bruising at the needle insertion site.  You feel weaker than normal.  You have a fever or chills. Get help right away if:  You have severe nausea or vomiting.  You have chest pain.  You have trouble breathing. This information is not intended to replace advice given to you by your health care provider. Make sure you discuss any questions you have with your health care provider. Document Released: 09/06/2010 Document Revised: 12/05/2015 Document Reviewed: 03/31/2014 Elsevier Interactive Patient Education  2017 Elsevier Inc.  

## 2016-08-14 ENCOUNTER — Encounter (HOSPITAL_COMMUNITY): Payer: Self-pay | Admitting: Emergency Medicine

## 2016-08-14 ENCOUNTER — Ambulatory Visit (HOSPITAL_COMMUNITY)
Admission: EM | Admit: 2016-08-14 | Discharge: 2016-08-14 | Disposition: A | Discharge status: Home / Self Care | Payer: BLUE CROSS/BLUE SHIELD | Attending: Internal Medicine | Admitting: Internal Medicine

## 2016-08-14 DIAGNOSIS — J020 Streptococcal pharyngitis: Secondary | ICD-10-CM

## 2016-08-14 LAB — POCT RAPID STREP A: Streptococcus, Group A Screen (Direct): POSITIVE — AB

## 2016-08-14 MED ORDER — AZITHROMYCIN 250 MG PO TABS: ORAL_TABLET | ORAL | 0 refills | Status: DC

## 2016-08-14 MED ORDER — MAGIC MOUTHWASH W/LIDOCAINE: 5.0000 mL | Freq: Three times a day (TID) | ORAL | 0 refills | Status: DC | PRN

## 2016-08-14 MED ORDER — LIDOCAINE HCL 2 % IJ SOLN
INTRAMUSCULAR | Status: AC
  Filled 2016-08-14: qty 20

## 2016-08-14 NOTE — ED Provider Notes (Signed)
CSN: [id number]     Arrival date & time 08/14/16  1352 History   None    Chief Complaint  Patient presents with  . Sore Throat   (Consider location/radiation/quality/duration/timing/severity/associated sxs/prior Treatment) 66 year old female presents to clinic for evaluation of a 4 day history of sore throat. She recently returned from [place], stated that her symptoms started there and have gotten worse. She has difficulty swallowing, states this is the worst sore throat she has ever had. She denies any congestion, runny nose, cough, nausea, vomiting, diarrhea, or other associated symptoms. Reports her on a long-term history is Hemochromatosis   The history is provided by the patient.  Sore Throat  This is a new problem. Episode onset: 4 days. The problem occurs constantly. The problem has been gradually worsening. Pertinent negatives include no chest pain, no abdominal pain, no headaches and no shortness of breath. The symptoms are aggravated by swallowing and eating. The symptoms are relieved by drinking and NSAIDs. She has tried acetaminophen for the symptoms. The treatment provided mild relief.    Past Medical History:  Diagnosis Date  . Hemochromatosis    Past Surgical History:  Procedure Laterality Date  . NASAL SEPTUM SURGERY     Family History  Problem Relation Age of Onset  . Healthy Mother   . Acute myelogenous leukemia Father   . Bipolar disorder Sister   . Diabetes Neg Hx   . Cancer Neg Hx   . Heart failure Neg Hx   . Hyperlipidemia Neg Hx   . Hypertension Neg Hx    Social History  Substance Use Topics  . Smoking status: Current Every Day Smoker    Packs/day: 1.00  . Smokeless tobacco: Never Used  . Alcohol use No     Comment: former heavy drinker now sober   OB History    No data available     Review of Systems  Constitutional: Positive for fever. Negative for chills.  HENT: Positive for sore throat. Negative for congestion, rhinorrhea, sinus pain and  sinus pressure.   Eyes: Negative.   Respiratory: Negative for cough and shortness of breath.   Cardiovascular: Negative for chest pain and palpitations.  Gastrointestinal: Negative for abdominal pain, constipation, diarrhea, nausea and vomiting.  Genitourinary: Negative.   Musculoskeletal: Negative.   Skin: Negative.   Neurological: Negative for facial asymmetry and headaches.    Allergies  Penicillins  Home Medications   Prior to Admission medications   Medication Sig Start Date End Date Taking? Authorizing Provider  azithromycin (ZITHROMAX) 250 MG tablet Take two tablets daily for 5 days for Strep Throat 08/14/16   Barnet Glasgow, NP  magic mouthwash w/lidocaine SOLN Take 5 mLs by mouth 3 (three) times daily as needed for mouth pain. 08/14/16   Barnet Glasgow, NP   Meds Ordered and Administered this Visit  Medications - No data to display  BP 116/67 (BP Location: Left Arm)   Pulse 81   Temp 99.7 F (37.6 C) (Oral)   Resp 14   SpO2 100%  No data found.   Physical Exam  Constitutional: She is oriented to person, place, and time. She appears well-developed and well-nourished. No distress.  HENT:  Head: Normocephalic and atraumatic.  Right Ear: Tympanic membrane and external ear normal.  Left Ear: Tympanic membrane and external ear normal.  Nose: Nose normal.  Mouth/Throat: Uvula is midline. Posterior oropharyngeal edema and posterior oropharyngeal erythema present. Tonsils are 3+ on the right. Tonsils are 3+ on the left.  Eyes:  Conjunctivae are normal. Right eye exhibits no discharge. Left eye exhibits no discharge.  Neck: Normal range of motion.  Cardiovascular: Normal rate and regular rhythm.   Pulmonary/Chest: Effort normal and breath sounds normal.  Neurological: She is alert and oriented to person, place, and time.  Skin: Skin is warm and dry. Capillary refill takes less than 2 seconds. She is not diaphoretic.  Psychiatric: She has a normal mood and affect. Her  behavior is normal.  Nursing note and vitals reviewed.   Urgent Care Course     Procedures (including critical care time)  Labs Review Labs Reviewed  POCT RAPID STREP A - Abnormal; Notable for the following:       Result Value   Streptococcus, Group A Screen (Direct) POSITIVE (*)    All other components within normal limits    Imaging Review No results found.    MDM   1. Strep pharyngitis    Positive for strep, started on azithromycin due to penicillin allergy, given Magic mouthwash for pain, provided counseling on over-the-counter therapies for symptom management. Follow-up with primary care or return to clinic as needed.    Barnet Glasgow, NP 08/14/16 1711

## 2016-08-14 NOTE — ED Triage Notes (Signed)
The patient presented to the [facility] with a complaint of a sore throat x 4 days.

## 2016-08-14 NOTE — Discharge Instructions (Signed)
For your strep throat, and prescribed azithromycin, take 2 tablets daily for 5 days. For pain, prescribed Magic mouthwash, swish, gargle, swallow up to 3 times a day as needed. If your pain persist past one week return to clinic.

## 2016-08-16 ENCOUNTER — Other Ambulatory Visit: Payer: Self-pay | Admitting: Emergency Medicine

## 2016-08-17 ENCOUNTER — Telehealth: Payer: Self-pay | Admitting: Emergency Medicine

## 2016-08-17 ENCOUNTER — Ambulatory Visit: Payer: BLUE CROSS/BLUE SHIELD | Admitting: Cardiovascular Disease

## 2016-08-17 NOTE — Telephone Encounter (Signed)
Patient states she was recently diagnosed with strep throat and is currently taking antibiotics; states she feels very tired and would like to reschedule her phlebotomy. Advised patient per Dr Lindi Adie that it would be ok to reschedule the phlebotomy to next week. Patient verbalized understanding. Message sent to schedulers for new phlebotomy appointment.

## 2016-08-18 ENCOUNTER — Telehealth: Payer: Self-pay | Admitting: Hematology and Oncology

## 2016-08-18 ENCOUNTER — Other Ambulatory Visit: Payer: Self-pay | Admitting: Emergency Medicine

## 2016-08-18 NOTE — Telephone Encounter (Signed)
sw pt to inform of added phelbotomy appt on 5/11 after labs

## 2016-08-19 ENCOUNTER — Ambulatory Visit (INDEPENDENT_AMBULATORY_CARE_PROVIDER_SITE_OTHER): Payer: BLUE CROSS/BLUE SHIELD | Admitting: Cardiovascular Disease

## 2016-08-19 ENCOUNTER — Encounter: Payer: Self-pay | Admitting: Cardiovascular Disease

## 2016-08-19 VITALS — BP 102/64 | HR 72 | Ht 60.0 in | Wt 104.0 lb

## 2016-08-19 DIAGNOSIS — R42 Dizziness and giddiness: Secondary | ICD-10-CM | POA: Diagnosis not present

## 2016-08-19 NOTE — Progress Notes (Signed)
     08/19/2016 Kara [name]   [birth year]  [id number]  Primary Physician No PCP Per Patient Primary Cardiologist: Lorretta Harp MD Renae Gloss  HPI:  Kara [name] is a 66 year old thin appearing single Caucasian female with no children who recently moved to [place] from [place] to be near her elderly mother. She was referred for evaluation of dizziness. She has no prior cardiac history. She does smoke a pack a day for 50 years. She's never had a heart attack or stroke and denies chest pain or shortness of breath. She has had a diagnosis of hemochromatosis. Last 17 years and has had intermittent phlebotomy. She's also complained of dizziness for last 2-1/2 years that occur sporadically as never had frank syncope. She has not had an episode of dizziness for the last 2 months.   No current outpatient prescriptions on file.   No current facility-administered medications for this visit.     Allergies  Allergen Reactions  . Penicillins Rash    Social History   Social History  . Marital status: Single    Spouse name: N/A  . Number of children: N/A  . Years of education: N/A   Occupational History  . Not on file.   Social History Main Topics  . Smoking status: Current Every Day Smoker    Packs/day: 1.00  . Smokeless tobacco: Never Used  . Alcohol use No     Comment: former heavy drinker now sober  . Drug use: No  . Sexual activity: Not on file   Other Topics Concern  . Not on file   Social History Narrative   She is living with mother.    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   Highest level education:  masters     Review of Systems: General: negative for chills, fever, night sweats or weight changes.  Cardiovascular: negative for chest pain, dyspnea on exertion, edema, orthopnea, palpitations, paroxysmal nocturnal dyspnea or shortness of breath Dermatological: negative for rash Respiratory: negative for cough or wheezing Urologic:  negative for hematuria Abdominal: negative for nausea, vomiting, diarrhea, bright red blood per rectum, melena, or hematemesis Neurologic: negative for visual changes, syncope, or dizziness All other systems reviewed and are otherwise negative except as noted above.    Blood pressure 102/64, pulse 72, height 5' (1.524 m), weight 104 lb (47.2 kg).  General appearance: alert and no distress Neck: no adenopathy, no carotid bruit, no JVD, supple, symmetrical, trachea midline and thyroid not enlarged, symmetric, no tenderness/mass/nodules Lungs: clear to auscultation bilaterally Heart: regular rate and rhythm, S1, S2 normal, no murmur, click, rub or gallop Extremities: extremities normal, atraumatic, no cyanosis or edema  EKG sinus rhythm at 72 with ST or T-wave changes. I personally reviewed this EKG  ASSESSMENT AND PLAN:   Dizziness Kara [name] was referred for evaluation of dizziness. She carries a diagnosis of hemochromatosis for the last 17 years and recently has been getting routine phlebotomy. She denies chest pain or shortness of breath. She has not had an episode for the last several months. I am going to get a 2-D echocardiogram to rule out cardiac involvement of her hemochromatosis.      Lorretta Harp MD FACP,FACC,FAHA, [facility] 08/19/2016 4:40 PM

## 2016-08-19 NOTE — Patient Instructions (Addendum)

## 2016-08-19 NOTE — Assessment & Plan Note (Signed)
Kara [name] was referred for evaluation of dizziness. She carries a diagnosis of hemochromatosis for the last 17 years and recently has been getting routine phlebotomy. She denies chest pain or shortness of breath. She has not had an episode for the last several months. I am going to get a 2-D echocardiogram to rule out cardiac involvement of her hemochromatosis.

## 2016-08-22 ENCOUNTER — Telehealth: Payer: Self-pay

## 2016-08-22 NOTE — Telephone Encounter (Signed)
Called pt and lvm with call back number.  

## 2016-08-22 NOTE — Telephone Encounter (Signed)
Pt is calling b/c she was expecting her phlebotomy today. She states Dr Lindi Adie told her last week to have it done today. It is scheduled for Friday. She is asking if Dr Lindi Adie wants it moved to earlier in the week, keep it on Friday or cancel it altogether since it is the 4th phlebotomy. "I am a bit annoyed because I repeatedly told scheduler he wants it on Monday"  She is asking for a call back to clarify this.

## 2016-08-22 NOTE — Telephone Encounter (Signed)
Attempted to contact pt regarding phlebotomy schedule. lvm and contact number to call back and discuss pt appt this week.

## 2016-08-24 NOTE — Addendum Note (Signed)
Addended by: Zebedee Iba on: 08/24/2016 04:46 PM   Modules accepted: Orders

## 2016-08-26 ENCOUNTER — Ambulatory Visit (HOSPITAL_BASED_OUTPATIENT_CLINIC_OR_DEPARTMENT_OTHER): Payer: BLUE CROSS/BLUE SHIELD

## 2016-08-26 ENCOUNTER — Other Ambulatory Visit: Payer: BLUE CROSS/BLUE SHIELD

## 2016-08-26 ENCOUNTER — Telehealth: Payer: Self-pay | Admitting: Hematology and Oncology

## 2016-08-26 NOTE — Telephone Encounter (Signed)
Rescheduled appt per patient request to 5/16 unable to make lab appt on 5/18

## 2016-08-26 NOTE — Progress Notes (Signed)
515 grams of blood removed from R (A/C) with 22G. Patient tolerated well. 30 minute observation and patient discharged in stable condition. Snacks/drinks provided before and after phlebotomy.

## 2016-08-26 NOTE — Patient Instructions (Signed)
Therapeutic Phlebotomy Therapeutic phlebotomy is the controlled removal of blood from a person's body for the purpose of treating a medical condition. The procedure is similar to donating blood. Usually, about a pint (470 mL, or 0.47L) of blood is removed. The average adult has 9-12 pints (4.3-5.7 L) of blood. Therapeutic phlebotomy may be used to treat the following medical conditions:  Hemochromatosis. This is a condition in which the blood contains too much iron.  Polycythemia vera. This is a condition in which the blood contains too many red blood cells.  Porphyria cutanea tarda. This is a disease in which an important part of hemoglobin is not made properly. It results in the buildup of abnormal amounts of porphyrins in the body.  Sickle cell disease. This is a condition in which the red blood cells form an abnormal crescent shape rather than a round shape. Tell a health care provider about:  Any allergies you have.  All medicines you are taking, including vitamins, herbs, eye drops, creams, and over-the-counter medicines.  Any problems you or family members have had with anesthetic medicines.  Any blood disorders you have.  Any surgeries you have had.  Any medical conditions you have. What are the risks? Generally, this is a safe procedure. However, problems may occur, including:  Nausea or light-headedness.  Low blood pressure.  Soreness, bleeding, swelling, or bruising at the needle insertion site.  Infection. What happens before the procedure?  Follow instructions from your health care provider about eating or drinking restrictions.  Ask your health care provider about changing or stopping your regular medicines. This is especially important if you are taking diabetes medicines or blood thinners.  Wear clothing with sleeves that can be raised above the elbow.  Plan to have someone take you home after the procedure.  You may have a blood sample taken. What happens  during the procedure?  A needle will be inserted into one of your veins.  Tubing and a collection bag will be attached to that needle.  Blood will flow through the needle and tubing into the collection bag.  You may be asked to open and close your hand slowly and continually during the entire collection.  After the specified amount of blood has been removed from your body, the collection bag and tubing will be clamped.  The needle will be removed from your vein.  Pressure will be held on the site of the needle insertion to stop the bleeding.  A bandage (dressing) will be placed over the needle insertion site. The procedure may vary among health care providers and hospitals. What happens after the procedure?  Your recovery will be assessed and monitored.  You can return to your normal activities as directed by your health care provider. This information is not intended to replace advice given to you by your health care provider. Make sure you discuss any questions you have with your health care provider. Document Released: 09/06/2010 Document Revised: 12/05/2015 Document Reviewed: 03/31/2014 Elsevier Interactive Patient Education  2017 Elsevier Inc.  

## 2016-08-29 ENCOUNTER — Ambulatory Visit: Payer: BLUE CROSS/BLUE SHIELD | Admitting: Hematology and Oncology

## 2016-08-29 ENCOUNTER — Telehealth: Payer: Self-pay | Admitting: *Deleted

## 2016-08-29 NOTE — Telephone Encounter (Signed)
Pt called for clarification of appointments. 5/16 and 5/21 appointments confirmed.

## 2016-08-31 ENCOUNTER — Other Ambulatory Visit (HOSPITAL_BASED_OUTPATIENT_CLINIC_OR_DEPARTMENT_OTHER): Payer: BLUE CROSS/BLUE SHIELD

## 2016-08-31 LAB — CBC WITH DIFFERENTIAL/PLATELET
BASO%: 1.3 % (ref 0.0–2.0)
Basophils Absolute: 0.1 10*3/uL (ref 0.0–0.1)
EOS%: 2.1 % (ref 0.0–7.0)
Eosinophils Absolute: 0.2 10*3/uL (ref 0.0–0.5)
HCT: 38.2 % (ref 34.8–46.6)
HGB: 13 g/dL (ref 11.6–15.9)
LYMPH%: 19.1 % (ref 14.0–49.7)
MCH: 33.2 pg (ref 25.1–34.0)
MCHC: 34.1 g/dL (ref 31.5–36.0)
MCV: 97.3 fL (ref 79.5–101.0)
MONO#: 0.6 10*3/uL (ref 0.1–0.9)
MONO%: 5.8 % (ref 0.0–14.0)
NEUT#: 7.9 10*3/uL — ABNORMAL HIGH (ref 1.5–6.5)
NEUT%: 71.7 % (ref 38.4–76.8)
Platelets: 652 10*3/uL — ABNORMAL HIGH (ref 145–400)
RBC: 3.92 10*6/uL (ref 3.70–5.45)
RDW: 15.1 % — ABNORMAL HIGH (ref 11.2–14.5)
WBC: 11 10*3/uL — ABNORMAL HIGH (ref 3.9–10.3)
lymph#: 2.1 10*3/uL (ref 0.9–3.3)

## 2016-08-31 LAB — IRON AND TIBC
%SAT: 28 % (ref 21–57)
Iron: 75 ug/dL (ref 41–142)
TIBC: 274 ug/dL (ref 236–444)
UIBC: 198 ug/dL (ref 120–384)

## 2016-08-31 LAB — FERRITIN: Ferritin: 55 ng/ml (ref 9–269)

## 2016-09-02 ENCOUNTER — Other Ambulatory Visit: Payer: BLUE CROSS/BLUE SHIELD

## 2016-09-02 NOTE — Assessment & Plan Note (Signed)
Hereditary hemachromatosis Genetic testing revealed homozygous C282Y mutation Current treatment: Phlebotomy with the goal to keep ferritin less than 100 and saturation less than 50% When the phlebotomy was every 2 weeks, her iron saturation had increased markedly. Because this we went on weekly phlebotomies 4.   Lab review: 08/31/2016: Ferritin 55, iron saturation 28% decline from 76% with a hemoglobin of 13 Treatment plan: We will resume every 2 weeks phlebotomy treatments.  Return to clinic in 2 months for follow-up with labs done ahead of time.

## 2016-09-05 ENCOUNTER — Ambulatory Visit (HOSPITAL_BASED_OUTPATIENT_CLINIC_OR_DEPARTMENT_OTHER): Payer: BLUE CROSS/BLUE SHIELD | Admitting: Hematology and Oncology

## 2016-09-05 ENCOUNTER — Encounter: Payer: Self-pay | Admitting: Hematology and Oncology

## 2016-09-05 ENCOUNTER — Other Ambulatory Visit (HOSPITAL_COMMUNITY): Payer: BLUE CROSS/BLUE SHIELD

## 2016-09-05 NOTE — Progress Notes (Signed)
Patient Care Team: Patient, No Pcp Per as PCP - General (General Practice)  DIAGNOSIS:  Encounter Diagnosis  Name Primary?  . Hereditary hemochromatosis ([place])     CHIEF COMPLIANT: Follow-up of hereditary hemochromatosis on phlebotomy treatments  INTERVAL HISTORY: Kara [name] is a 66 year old lady with hereditary hemochromatosis who had undergone for phlebotomy treatments once a week to rapidly decrease her iron levels. She tolerated these phlebotomies fairly well. Repeat iron testing showed an excellent response to the phlebotomy. Patient also has been feeling a lot better. She has been having less dizziness spells.  REVIEW OF SYSTEMS:   Constitutional: Denies fevers, chills or abnormal weight loss Eyes: Denies blurriness of vision Ears, nose, mouth, throat, and face: Denies mucositis or sore throat Respiratory: Denies cough, dyspnea or wheezes Cardiovascular: Denies palpitation, chest discomfort Gastrointestinal:  Denies nausea, heartburn or change in bowel habits Skin: Denies abnormal skin rashes Lymphatics: Denies new lymphadenopathy or easy bruising Neurological:Denies numbness, tingling or new weaknesses Behavioral/Psych: Mood is stable, no new changes  Extremities: No lower extremity edema All other systems were reviewed with the patient and are negative.  I have reviewed the past medical history, past surgical history, social history and family history with the patient and they are unchanged from previous note.  ALLERGIES:  is allergic to penicillins.  MEDICATIONS:  No current outpatient prescriptions on file.   No current facility-administered medications for this visit.     PHYSICAL EXAMINATION: ECOG PERFORMANCE STATUS: 1 - Symptomatic but completely ambulatory  Vitals:   09/05/16 0837  BP: 116/60  Pulse: 69  Resp: 18  Temp: 97.8 F (36.6 C)   Filed Weights   09/05/16 0837  Weight: 104 lb 3.2 oz (47.3 kg)    GENERAL:alert, no distress and  comfortable SKIN: skin color, texture, turgor are normal, no rashes or significant lesions EYES: normal, Conjunctiva are pink and non-injected, sclera clear OROPHARYNX:no exudate, no erythema and lips, buccal mucosa, and tongue normal  NECK: supple, thyroid normal size, non-tender, without nodularity LYMPH:  no palpable lymphadenopathy in the cervical, axillary or inguinal LUNGS: clear to auscultation and percussion with normal breathing effort HEART: regular rate & rhythm and no murmurs and no lower extremity edema ABDOMEN:abdomen soft, non-tender and normal bowel sounds MUSCULOSKELETAL:no cyanosis of digits and no clubbing  NEURO: alert & oriented x 3 with fluent speech, no focal motor/sensory deficits EXTREMITIES: No lower extremity edema  LABORATORY DATA:  I have reviewed the data as listed   Chemistry      Component Value Date/Time   NA 138 04/28/2016 1617   K 4.7 04/28/2016 1617   CL 108 04/28/2016 1617   CO2 25 04/28/2016 1617   BUN 16 04/28/2016 1617   CREATININE 0.53 04/28/2016 1617      Component Value Date/Time   CALCIUM 9.7 04/28/2016 1617   ALKPHOS 66 04/28/2016 1617   AST 18 04/28/2016 1617   ALT 10 04/28/2016 1617   BILITOT 0.3 04/28/2016 1617       Lab Results  Component Value Date   WBC 11.0 (H) 08/31/2016   HGB 13.0 08/31/2016   HCT 38.2 08/31/2016   MCV 97.3 08/31/2016   PLT 652 (H) 08/31/2016   NEUTROABS 7.9 (H) 08/31/2016    ASSESSMENT & PLAN:  Hereditary hemochromatosis ([place]) Hereditary hemachromatosis Genetic testing revealed homozygous C282Y mutation Current treatment: Phlebotomy with the goal to keep ferritin less than 100 and saturation less than 50% When the phlebotomy was every 2 weeks, her iron saturation had increased markedly.  Because this we went on weekly phlebotomies 4.   Lab review: 08/31/2016: Ferritin 55, iron saturation 28% decline from 76% with a hemoglobin of 13 Treatment plan: We will resume every 2 weeks phlebotomy  treatments.  Return to clinic in 2 months for follow-up with labs done ahead of time.   I spent 25 minutes talking to the patient of which more than half was spent in counseling and coordination of care.  No orders of the defined types were placed in this encounter.  The patient has a good understanding of the overall plan. she agrees with it. she will call with any problems that may develop before the next visit here.   Rulon Eisenmenger, MD 09/05/16

## 2016-09-06 ENCOUNTER — Other Ambulatory Visit: Payer: Self-pay

## 2016-09-06 ENCOUNTER — Ambulatory Visit (HOSPITAL_COMMUNITY): Payer: BLUE CROSS/BLUE SHIELD | Attending: Cardiovascular Disease

## 2016-09-06 DIAGNOSIS — R42 Dizziness and giddiness: Secondary | ICD-10-CM | POA: Diagnosis present

## 2016-09-06 DIAGNOSIS — I071 Rheumatic tricuspid insufficiency: Secondary | ICD-10-CM | POA: Insufficient documentation

## 2016-09-07 ENCOUNTER — Telehealth: Payer: Self-pay

## 2016-09-07 NOTE — Telephone Encounter (Signed)
Pt called to request phlebotomy to be rescheduled to early morning this Friday 5/25 due to unexpected work meeting in the afternoon. Sent high priority message to scheduling to have request done by tomorrow. Will follow up and call pt back tomorrow with schedule.

## 2016-09-09 ENCOUNTER — Ambulatory Visit (HOSPITAL_BASED_OUTPATIENT_CLINIC_OR_DEPARTMENT_OTHER): Payer: BLUE CROSS/BLUE SHIELD

## 2016-09-09 NOTE — Patient Instructions (Signed)
Therapeutic Phlebotomy Therapeutic phlebotomy is the controlled removal of blood from a person's body for the purpose of treating a medical condition. The procedure is similar to donating blood. Usually, about a pint (470 mL, or 0.47L) of blood is removed. The average adult has 9-12 pints (4.3-5.7 L) of blood. Therapeutic phlebotomy may be used to treat the following medical conditions:  Hemochromatosis. This is a condition in which the blood contains too much iron.  Polycythemia vera. This is a condition in which the blood contains too many red blood cells.  Porphyria cutanea tarda. This is a disease in which an important part of hemoglobin is not made properly. It results in the buildup of abnormal amounts of porphyrins in the body.  Sickle cell disease. This is a condition in which the red blood cells form an abnormal crescent shape rather than a round shape. Tell a health care provider about:  Any allergies you have.  All medicines you are taking, including vitamins, herbs, eye drops, creams, and over-the-counter medicines.  Any problems you or family members have had with anesthetic medicines.  Any blood disorders you have.  Any surgeries you have had.  Any medical conditions you have. What are the risks? Generally, this is a safe procedure. However, problems may occur, including:  Nausea or light-headedness.  Low blood pressure.  Soreness, bleeding, swelling, or bruising at the needle insertion site.  Infection. What happens before the procedure?  Follow instructions from your health care provider about eating or drinking restrictions.  Ask your health care provider about changing or stopping your regular medicines. This is especially important if you are taking diabetes medicines or blood thinners.  Wear clothing with sleeves that can be raised above the elbow.  Plan to have someone take you home after the procedure.  You may have a blood sample taken. What happens  during the procedure?  A needle will be inserted into one of your veins.  Tubing and a collection bag will be attached to that needle.  Blood will flow through the needle and tubing into the collection bag.  You may be asked to open and close your hand slowly and continually during the entire collection.  After the specified amount of blood has been removed from your body, the collection bag and tubing will be clamped.  The needle will be removed from your vein.  Pressure will be held on the site of the needle insertion to stop the bleeding.  A bandage (dressing) will be placed over the needle insertion site. The procedure may vary among health care providers and hospitals. What happens after the procedure?  Your recovery will be assessed and monitored.  You can return to your normal activities as directed by your health care provider. This information is not intended to replace advice given to you by your health care provider. Make sure you discuss any questions you have with your health care provider. Document Released: 09/06/2010 Document Revised: 12/05/2015 Document Reviewed: 03/31/2014 Elsevier Interactive Patient Education  2017 Elsevier Inc.  

## 2016-09-09 NOTE — Progress Notes (Signed)
Phlebotomy attempt to left AC with 18g needle. 50 grams removed and then clotted off.

## 2016-09-15 ENCOUNTER — Telehealth: Payer: Self-pay | Admitting: Cardiovascular Disease

## 2016-09-15 DIAGNOSIS — R42 Dizziness and giddiness: Secondary | ICD-10-CM

## 2016-09-15 NOTE — Telephone Encounter (Signed)
New message    Pt is calling asking for the results of her echo. She is asking for a call back today regarding this.

## 2016-09-15 NOTE — Telephone Encounter (Signed)
Patient called in for her ECHO results. She was informed that it was normal. She would like to know what the next step will be for her continued dizzy spells that she is experiencing. Will route to the physician for further recommendations.

## 2016-09-23 ENCOUNTER — Ambulatory Visit: Payer: BLUE CROSS/BLUE SHIELD | Admitting: Neurology

## 2016-09-23 ENCOUNTER — Ambulatory Visit (HOSPITAL_BASED_OUTPATIENT_CLINIC_OR_DEPARTMENT_OTHER): Payer: BLUE CROSS/BLUE SHIELD

## 2016-09-23 NOTE — Patient Instructions (Signed)

## 2016-09-23 NOTE — Progress Notes (Signed)
Kara [name] presents today for phlebotomy per MD orders. Phlebotomy procedure started at 1540 and ended at 1555. 500 grams removed. Patient observed for 30 minutes after procedure without any incident. Patient tolerated procedure well. IV needle removed intact.

## 2016-09-26 NOTE — Telephone Encounter (Signed)
Spoke with pt, she is asking what the next step is. She continues to have feelings of faintness and a burning sensation in her eyes with out the heat. Aware will forward to dr berry to review and advise.

## 2016-09-26 NOTE — Telephone Encounter (Signed)
Follow up      Pt is calling to speak to a nurse.  She has several questions regarding her echo and the next step

## 2016-09-27 NOTE — Telephone Encounter (Signed)
Spoke to patient.Dr.Berry's advice given.Scheduler will call back to schedule event monitor.Stated she is out of town.She would like a call back this Fri 09/30/16.

## 2016-09-27 NOTE — Telephone Encounter (Signed)
I saw patient on 08/23/16. A 2-D echo was normal. She had not had any further episodes at that time. We had ordered an event monitor which has not yet been done. No changes in treatment plan or workup at this time.

## 2016-10-07 ENCOUNTER — Ambulatory Visit (HOSPITAL_BASED_OUTPATIENT_CLINIC_OR_DEPARTMENT_OTHER): Payer: BLUE CROSS/BLUE SHIELD

## 2016-10-07 NOTE — Progress Notes (Signed)
Kara [name] presents today for phlebotomy per MD orders. Phlebotomy procedure started at 1547 and ended at 1558. 530 grams removed via 18 gauge PIV to left AC. Patient observed for 30 minutes after procedure without any incident. Patient tolerated procedure well.

## 2016-10-07 NOTE — Patient Instructions (Signed)

## 2016-10-12 ENCOUNTER — Ambulatory Visit: Payer: BLUE CROSS/BLUE SHIELD | Admitting: Neurology

## 2016-10-13 ENCOUNTER — Ambulatory Visit (INDEPENDENT_AMBULATORY_CARE_PROVIDER_SITE_OTHER): Payer: BLUE CROSS/BLUE SHIELD

## 2016-10-13 DIAGNOSIS — R42 Dizziness and giddiness: Secondary | ICD-10-CM

## 2016-10-21 ENCOUNTER — Other Ambulatory Visit: Payer: Self-pay

## 2016-10-21 ENCOUNTER — Ambulatory Visit (HOSPITAL_BASED_OUTPATIENT_CLINIC_OR_DEPARTMENT_OTHER): Payer: BLUE CROSS/BLUE SHIELD

## 2016-10-21 NOTE — Patient Instructions (Signed)

## 2016-10-21 NOTE — Progress Notes (Signed)
Kara [name] presents today for phlebotomy per MD orders. Phlebotomy procedure started at 1519 and ended at 1522 via 16 gauge to left AC; PIV stopped flowing and only 75 grams of blood removed. 18 gauge PIV placed to Left AC and phlebotomy restarted at 1527 and ended at 1538 with 450 grams of blood removed. Patient observed for 30 minutes after procedure without any incident. Pt had post procedure snacks and nourishment. Patient tolerated procedure well. IV needle removed intact.

## 2016-11-08 ENCOUNTER — Other Ambulatory Visit: Payer: Self-pay

## 2016-11-09 ENCOUNTER — Ambulatory Visit (HOSPITAL_BASED_OUTPATIENT_CLINIC_OR_DEPARTMENT_OTHER): Payer: BLUE CROSS/BLUE SHIELD | Admitting: Hematology and Oncology

## 2016-11-09 ENCOUNTER — Encounter (HOSPITAL_COMMUNITY): Payer: Self-pay | Admitting: Emergency Medicine

## 2016-11-09 ENCOUNTER — Other Ambulatory Visit (HOSPITAL_BASED_OUTPATIENT_CLINIC_OR_DEPARTMENT_OTHER): Payer: BLUE CROSS/BLUE SHIELD

## 2016-11-09 ENCOUNTER — Encounter: Payer: Self-pay | Admitting: Hematology and Oncology

## 2016-11-09 VITALS — BP 117/64 | HR 71 | Temp 98.2°F | Resp 16 | Ht 60.0 in | Wt 104.1 lb

## 2016-11-09 DIAGNOSIS — R42 Dizziness and giddiness: Secondary | ICD-10-CM

## 2016-11-09 LAB — CBC WITH DIFFERENTIAL/PLATELET
BASO%: 0.3 % (ref 0.0–2.0)
Basophils Absolute: 0 10*3/uL (ref 0.0–0.1)
EOS ABS: 0.3 10*3/uL (ref 0.0–0.5)
EOS%: 2.5 % (ref 0.0–7.0)
HEMATOCRIT: 36.2 % (ref 34.8–46.6)
HEMOGLOBIN: 11.8 g/dL (ref 11.6–15.9)
LYMPH#: 2.9 10*3/uL (ref 0.9–3.3)
LYMPH%: 27.2 % (ref 14.0–49.7)
MCH: 29.4 pg (ref 25.1–34.0)
MCHC: 32.6 g/dL (ref 31.5–36.0)
MCV: 90 fL (ref 79.5–101.0)
MONO#: 0.7 10*3/uL (ref 0.1–0.9)
MONO%: 6.3 % (ref 0.0–14.0)
NEUT%: 63.7 % (ref 38.4–76.8)
NEUTROS ABS: 6.8 10*3/uL — AB (ref 1.5–6.5)
PLATELETS: 621 10*3/uL — AB (ref 145–400)
RBC: 4.02 10*6/uL (ref 3.70–5.45)
RDW: 14 % (ref 11.2–14.5)
WBC: 10.6 10*3/uL — AB (ref 3.9–10.3)

## 2016-11-09 SURGERY — IRRIGATION AND DEBRIDEMENT EXTREMITY
Anesthesia: General | Laterality: Left

## 2016-11-09 NOTE — Progress Notes (Signed)
Patient Care Team: Patient, No Pcp Per as PCP - General (General Practice)  DIAGNOSIS:  Encounter Diagnoses  Name Primary?  . Dizziness Yes  . Hereditary hemochromatosis ([place])     CHIEF COMPLIANT: Intermittent dizziness, every 2 week phlebotomy  INTERVAL HISTORY: Kara [name] is a 66 year old with hereditary hemochromatosis who is currently receiving every 2 week phlebotomy treatments. She is been tolerating it fairly well. She has noticed 2 episodes of dizziness. She has seen cardiology did not seem to identify any underlying electrical etiology from the heart. She is wondering if it could be related to her carotid vasculature.  REVIEW OF SYSTEMS:   Constitutional: Denies fevers, chills or abnormal weight loss Eyes: Denies blurriness of vision Ears, nose, mouth, throat, and face: Denies mucositis or sore throat Respiratory: Denies cough, dyspnea or wheezes Cardiovascular: Denies palpitation, chest discomfort Gastrointestinal:  Denies nausea, heartburn or change in bowel habits Skin: Denies abnormal skin rashes Lymphatics: Denies new lymphadenopathy or easy bruising Neurological: Dizziness that comes on suddenly Behavioral/Psych: Mood is stable, no new changes  Extremities: No lower extremity edema All other systems were reviewed with the patient and are negative.  I have reviewed the past medical history, past surgical history, social history and family history with the patient and they are unchanged from previous note.  ALLERGIES:  is allergic to penicillins.  MEDICATIONS:  No current outpatient prescriptions on file.   No current facility-administered medications for this visit.     PHYSICAL EXAMINATION: ECOG PERFORMANCE STATUS: 1 - Symptomatic but completely ambulatory  Vitals:   11/09/16 1520  BP: 117/64  Pulse: 71  Resp: 16  Temp: 98.2 F (36.8 C)   Filed Weights   11/09/16 1520  Weight: 104 lb 1.6 oz (47.2 kg)    GENERAL:alert, no distress and  comfortable SKIN: skin color, texture, turgor are normal, no rashes or significant lesions EYES: normal, Conjunctiva are pink and non-injected, sclera clear OROPHARYNX:no exudate, no erythema and lips, buccal mucosa, and tongue normal  NECK: supple, thyroid normal size, non-tender, without nodularity LYMPH:  no palpable lymphadenopathy in the cervical, axillary or inguinal LUNGS: clear to auscultation and percussion with normal breathing effort HEART: regular rate & rhythm and no murmurs and no lower extremity edema ABDOMEN:abdomen soft, non-tender and normal bowel sounds MUSCULOSKELETAL:no cyanosis of digits and no clubbing  NEURO: alert & oriented x 3 with fluent speech, no focal motor/sensory deficits EXTREMITIES: No lower extremity edema  LABORATORY DATA:  I have reviewed the data as listed   Chemistry      Component Value Date/Time   NA 138 04/28/2016 1617   K 4.7 04/28/2016 1617   CL 108 04/28/2016 1617   CO2 25 04/28/2016 1617   BUN 16 04/28/2016 1617   CREATININE 0.53 04/28/2016 1617      Component Value Date/Time   CALCIUM 9.7 04/28/2016 1617   ALKPHOS 66 04/28/2016 1617   AST 18 04/28/2016 1617   ALT 10 04/28/2016 1617   BILITOT 0.3 04/28/2016 1617       Lab Results  Component Value Date   WBC 10.6 (H) 11/09/2016   HGB 11.8 11/09/2016   HCT 36.2 11/09/2016   MCV 90.0 11/09/2016   PLT 621 (H) 11/09/2016   NEUTROABS 6.8 (H) 11/09/2016    ASSESSMENT & PLAN:  Hereditary hemochromatosis ([place]) Hereditary hemachromatosis Genetic testing revealed homozygous C282Y mutation Current treatment: Phlebotomy with the goal to keep ferritin less than 100 and saturation less than 50%  Lab review: 08/31/2016: Ferritin 55,  iron saturation 28% decline from 76% with a hemoglobin of 13  Treatment plan:  Awaiting today's iron studies On every 2 weeks phlebotomy treatments. Our plan is to decrease the frequency of phlebotomies. If her iron saturation is in good shape then we  may consider changing her phlebotomy to every 3 weeks.  Return to clinic in 9 weeks for follow-up with labs done ahead of time.  Dizziness: I will obtain a carotid artery ultrasound.  I spent 25 minutes talking to the patient of which more than half was spent in counseling and coordination of care.  No orders of the defined types were placed in this encounter.  The patient has a good understanding of the overall plan. she agrees with it. she will call with any problems that may develop before the next visit here.   Rulon Eisenmenger, MD 11/09/16

## 2016-11-09 NOTE — Assessment & Plan Note (Signed)
Hereditary hemachromatosis Genetic testing revealed homozygous C282Y mutation Current treatment: Phlebotomy with the goal to keep ferritin less than 100 and saturation less than 50%  Lab review: 08/31/2016: Ferritin 55, iron saturation 28% decline from 76% with a hemoglobin of 13  Treatment plan: On every 2 weeks phlebotomy treatments.  Return to clinic in 2 months for follow-up with labs done ahead of time.

## 2016-11-10 ENCOUNTER — Telehealth: Payer: Self-pay | Admitting: Hematology and Oncology

## 2016-11-10 LAB — IRON AND TIBC
%SAT: 7 % — AB (ref 21–57)
Iron: 24 ug/dL — ABNORMAL LOW (ref 41–142)
TIBC: 331 ug/dL (ref 236–444)
UIBC: 307 ug/dL (ref 120–384)

## 2016-11-10 LAB — FERRITIN: Ferritin: 6 ng/ml — ABNORMAL LOW (ref 9–269)

## 2016-11-10 NOTE — Telephone Encounter (Signed)
I called the patient to discuss the results of iron studies. Because I saturation is at 9% with very low ferritin, I recommended that she undergo phlebotomy once a month instead of once every 2 weeks.

## 2016-11-11 ENCOUNTER — Telehealth: Payer: Self-pay | Admitting: Hematology and Oncology

## 2016-11-11 NOTE — Telephone Encounter (Signed)
sw pt to confirm 8/7 appt at 3 pm per sch msg

## 2016-11-14 ENCOUNTER — Ambulatory Visit (HOSPITAL_COMMUNITY)
Admission: RE | Admit: 2016-11-14 | Discharge: 2016-11-14 | Disposition: A | Discharge status: Home / Self Care | Payer: BLUE CROSS/BLUE SHIELD | Source: Ambulatory Visit | Attending: Hematology and Oncology | Admitting: Hematology and Oncology

## 2016-11-14 DIAGNOSIS — R42 Dizziness and giddiness: Secondary | ICD-10-CM | POA: Diagnosis not present

## 2016-11-14 DIAGNOSIS — I6523 Occlusion and stenosis of bilateral carotid arteries: Secondary | ICD-10-CM | POA: Insufficient documentation

## 2016-11-14 LAB — VAS US CAROTID
LCCAPSYS: 76 cm/s
LEFT ECA DIAS: -18 cm/s
LEFT VERTEBRAL DIAS: 17 cm/s
LICADDIAS: -24 cm/s
LICAPSYS: -73 cm/s
Left CCA dist dias: 27 cm/s
Left CCA dist sys: 73 cm/s
Left CCA prox dias: 23 cm/s
Left ICA dist sys: -69 cm/s
Left ICA prox dias: -23 cm/s
RCCADSYS: -72 cm/s
RCCAPDIAS: -24 cm/s
RIGHT ECA DIAS: -12 cm/s
RIGHT VERTEBRAL DIAS: 9 cm/s
Right CCA prox sys: -115 cm/s

## 2016-11-14 NOTE — Progress Notes (Signed)
*  PRELIMINARY RESULTS* Vascular Ultrasound Carotid Duplex (Doppler) has been completed.  Preliminary findings: Bilateral: No significant (1-39%) ICA stenosis. Antegrade vertebral flow.    Landry Mellow, RDMS, RVT  11/14/2016, 9:32 AM

## 2016-11-22 ENCOUNTER — Ambulatory Visit (HOSPITAL_BASED_OUTPATIENT_CLINIC_OR_DEPARTMENT_OTHER): Payer: BLUE CROSS/BLUE SHIELD

## 2016-11-22 NOTE — Patient Instructions (Signed)
Therapeutic Phlebotomy Therapeutic phlebotomy is the controlled removal of blood from a person's body for the purpose of treating a medical condition. The procedure is similar to donating blood. Usually, about a pint (470 mL, or 0.47L) of blood is removed. The average adult has 9-12 pints (4.3-5.7 L) of blood. Therapeutic phlebotomy may be used to treat the following medical conditions:  Hemochromatosis. This is a condition in which the blood contains too much iron.  Polycythemia vera. This is a condition in which the blood contains too many red blood cells.  Porphyria cutanea tarda. This is a disease in which an important part of hemoglobin is not made properly. It results in the buildup of abnormal amounts of porphyrins in the body.  Sickle cell disease. This is a condition in which the red blood cells form an abnormal crescent shape rather than a round shape.  Tell a health care provider about:  Any allergies you have.  All medicines you are taking, including vitamins, herbs, eye drops, creams, and over-the-counter medicines.  Any problems you or family members have had with anesthetic medicines.  Any blood disorders you have.  Any surgeries you have had.  Any medical conditions you have. What are the risks? Generally, this is a safe procedure. However, problems may occur, including:  Nausea or light-headedness.  Low blood pressure.  Soreness, bleeding, swelling, or bruising at the needle insertion site.  Infection.  What happens before the procedure?  Follow instructions from your health care provider about eating or drinking restrictions.  Ask your health care provider about changing or stopping your regular medicines. This is especially important if you are taking diabetes medicines or blood thinners.  Wear clothing with sleeves that can be raised above the elbow.  Plan to have someone take you home after the procedure.  You may have a blood sample taken. What  happens during the procedure?  A needle will be inserted into one of your veins.  Tubing and a collection bag will be attached to that needle.  Blood will flow through the needle and tubing into the collection bag.  You may be asked to open and close your hand slowly and continually during the entire collection.  After the specified amount of blood has been removed from your body, the collection bag and tubing will be clamped.  The needle will be removed from your vein.  Pressure will be held on the site of the needle insertion to stop the bleeding.  A bandage (dressing) will be placed over the needle insertion site. The procedure may vary among health care providers and hospitals. What happens after the procedure?  Your recovery will be assessed and monitored.  You can return to your normal activities as directed by your health care provider. This information is not intended to replace advice given to you by your health care provider. Make sure you discuss any questions you have with your health care provider. Document Released: 09/06/2010 Document Revised: 12/05/2015 Document Reviewed: 03/31/2014 Elsevier Interactive Patient Education  2018 [employer]  

## 2016-11-22 NOTE — Progress Notes (Signed)
Per Dr.Gudena, aware of pt last iron sat of 7%  and fe levels of 6. He discussed with pt that she will be reducing her phlebotomy treatments to once a month. Pt last phlebotomy was on 10/21/16. Okay to proceed with phlebotomy today per MD. Will follow up with updated iron/fe levels from today's lab. Infusion RN notified.

## 2016-11-26 ENCOUNTER — Telehealth: Payer: Self-pay

## 2016-11-26 NOTE — Telephone Encounter (Signed)
Called and left a message with new appts as dr Lindi Adie is out of office 10/26

## 2016-12-14 ENCOUNTER — Telehealth: Payer: Self-pay | Admitting: Hematology and Oncology

## 2016-12-14 NOTE — Telephone Encounter (Signed)
Spoke with patient and moved her into an earlier time slot on Friday.

## 2016-12-16 ENCOUNTER — Ambulatory Visit: Payer: BLUE CROSS/BLUE SHIELD

## 2016-12-16 ENCOUNTER — Ambulatory Visit (HOSPITAL_BASED_OUTPATIENT_CLINIC_OR_DEPARTMENT_OTHER): Payer: BLUE CROSS/BLUE SHIELD

## 2016-12-16 ENCOUNTER — Other Ambulatory Visit: Payer: Self-pay | Admitting: *Deleted

## 2016-12-16 ENCOUNTER — Encounter (INDEPENDENT_AMBULATORY_CARE_PROVIDER_SITE_OTHER): Payer: Self-pay

## 2016-12-16 LAB — CBC WITH DIFFERENTIAL/PLATELET
BASO%: 0.3 % (ref 0.0–2.0)
Basophils Absolute: 0 10*3/uL (ref 0.0–0.1)
EOS%: 2 % (ref 0.0–7.0)
Eosinophils Absolute: 0.2 10*3/uL (ref 0.0–0.5)
HEMATOCRIT: 35.7 % (ref 34.8–46.6)
HEMOGLOBIN: 11.4 g/dL — AB (ref 11.6–15.9)
LYMPH#: 2.8 10*3/uL (ref 0.9–3.3)
LYMPH%: 24.1 % (ref 14.0–49.7)
MCH: 26.1 pg (ref 25.1–34.0)
MCHC: 31.9 g/dL (ref 31.5–36.0)
MCV: 81.9 fL (ref 79.5–101.0)
MONO#: 0.6 10*3/uL (ref 0.1–0.9)
MONO%: 5.4 % (ref 0.0–14.0)
NEUT%: 68.2 % (ref 38.4–76.8)
NEUTROS ABS: 8 10*3/uL — AB (ref 1.5–6.5)
PLATELETS: 583 10*3/uL — AB (ref 145–400)
RBC: 4.36 10*6/uL (ref 3.70–5.45)
RDW: 14.8 % — ABNORMAL HIGH (ref 11.2–14.5)
WBC: 11.8 10*3/uL — AB (ref 3.9–10.3)

## 2016-12-16 NOTE — Progress Notes (Signed)
Patient arrived to infusion room for phlebotomy today. Per parameters, goal is to keep Ferritin <100 and Saturation <50%. Last obtained lab values are from 07/25 with ferritin of 6 and saturation of 7%. Unable to reach Dr. Lindi Adie. Spoke with Hinda Lenis, RN. Plan made to bypass phlebotomy today, and obtain updated lab values instead, to prevent from depleting the patient's levels too drastically. Instructed patient to call Tuesday or Wednesday to obtain results of today's labs and plan for future phlebotomies. Patient voiced understanding and agreeable with plan.   Wylene Simmer, BSN, RN 12/16/2016 3:03 PM

## 2016-12-20 ENCOUNTER — Telehealth: Payer: Self-pay | Admitting: Hematology and Oncology

## 2016-12-20 LAB — IRON AND TIBC
%SAT: 5 % — ABNORMAL LOW (ref 21–57)
IRON: 17 ug/dL — AB (ref 41–142)
TIBC: 324 ug/dL (ref 236–444)
UIBC: 307 ug/dL (ref 120–384)

## 2016-12-20 LAB — FERRITIN: Ferritin: 6 ng/ml — ABNORMAL LOW (ref 9–269)

## 2016-12-20 NOTE — Telephone Encounter (Signed)
Spoke with patient and rescheduled her appts from October to December.

## 2016-12-20 NOTE — Telephone Encounter (Signed)
Called patient to inform her about the iron results She does not need phlebotomy in Oct. Will move it to December Sent a scheduling message.

## 2017-01-11 ENCOUNTER — Ambulatory Visit: Payer: BLUE CROSS/BLUE SHIELD | Admitting: Hematology and Oncology

## 2017-01-11 ENCOUNTER — Other Ambulatory Visit: Payer: BLUE CROSS/BLUE SHIELD

## 2017-01-13 ENCOUNTER — Ambulatory Visit: Payer: BLUE CROSS/BLUE SHIELD | Admitting: Hematology and Oncology

## 2017-02-09 ENCOUNTER — Other Ambulatory Visit: Payer: BLUE CROSS/BLUE SHIELD

## 2017-02-09 ENCOUNTER — Ambulatory Visit: Payer: BLUE CROSS/BLUE SHIELD | Admitting: Hematology and Oncology

## 2017-02-10 ENCOUNTER — Other Ambulatory Visit: Payer: BLUE CROSS/BLUE SHIELD

## 2017-02-10 ENCOUNTER — Ambulatory Visit: Payer: BLUE CROSS/BLUE SHIELD | Admitting: Hematology and Oncology

## 2017-03-22 ENCOUNTER — Other Ambulatory Visit: Payer: Self-pay

## 2017-03-23 ENCOUNTER — Other Ambulatory Visit (HOSPITAL_BASED_OUTPATIENT_CLINIC_OR_DEPARTMENT_OTHER): Payer: BLUE CROSS/BLUE SHIELD

## 2017-03-23 ENCOUNTER — Ambulatory Visit (HOSPITAL_BASED_OUTPATIENT_CLINIC_OR_DEPARTMENT_OTHER): Payer: BLUE CROSS/BLUE SHIELD | Admitting: Hematology and Oncology

## 2017-03-23 LAB — CBC WITH DIFFERENTIAL/PLATELET
BASO%: 0.8 % (ref 0.0–2.0)
Basophils Absolute: 0.1 10*3/uL (ref 0.0–0.1)
EOS%: 2.6 % (ref 0.0–7.0)
Eosinophils Absolute: 0.3 10*3/uL (ref 0.0–0.5)
HCT: 40.7 % (ref 34.8–46.6)
HGB: 13.4 g/dL (ref 11.6–15.9)
LYMPH#: 2.9 10*3/uL (ref 0.9–3.3)
LYMPH%: 27.4 % (ref 14.0–49.7)
MCH: 25.7 pg (ref 25.1–34.0)
MCHC: 33 g/dL (ref 31.5–36.0)
MCV: 77.8 fL — ABNORMAL LOW (ref 79.5–101.0)
MONO#: 0.8 10*3/uL (ref 0.1–0.9)
MONO%: 7.1 % (ref 0.0–14.0)
NEUT%: 62.1 % (ref 38.4–76.8)
NEUTROS ABS: 6.7 10*3/uL — AB (ref 1.5–6.5)
PLATELETS: 606 10*3/uL — AB (ref 145–400)
RBC: 5.23 10*6/uL (ref 3.70–5.45)
RDW: 23.1 % — AB (ref 11.2–14.5)
WBC: 10.7 10*3/uL — AB (ref 3.9–10.3)

## 2017-03-23 LAB — IRON AND TIBC
%SAT: 10 % — AB (ref 21–57)
Iron: 31 ug/dL — ABNORMAL LOW (ref 41–142)
TIBC: 303 ug/dL (ref 236–444)
UIBC: 271 ug/dL (ref 120–384)

## 2017-03-23 LAB — FERRITIN: Ferritin: 10 ng/ml (ref 9–269)

## 2017-03-23 NOTE — Assessment & Plan Note (Signed)
Hereditary hemachromatosis Genetic testing revealed homozygous C282Y mutation Current treatment: Phlebotomy with the goal to keep ferritin less than 100 and saturation less than 50%  Lab review: 08/31/2016: Ferritin 55, iron saturation 28% decline from 76% with a hemoglobin of 13  Treatment plan: Last phlebotomy was 12/16/2016  Return to clinic in 2 months for follow-up with labs done ahead of time.

## 2017-03-24 NOTE — Progress Notes (Signed)
Patient Care Team: Patient, No Pcp Per as PCP - General (General Practice)  DIAGNOSIS:  Encounter Diagnosis  Name Primary?  . Hereditary hemochromatosis ([place])     CHIEF COMPLIANT: Follow-up of hereditary hemochromatosis  INTERVAL HISTORY: Kara [name] is a 66 year old with a history of hereditary hemochromatosis who is here for a 23-month follow-up and reports that overall her health has been fairly decent.  She has had a lot of family health issues both with her mother and her sister.  She had one more episode of dizziness/vertigo yesterday.  Prior to that she had done extremely well for the past 3 months.  REVIEW OF SYSTEMS:   Constitutional: Denies fevers, chills or abnormal weight loss Eyes: Denies blurriness of vision Ears, nose, mouth, throat, and face: Denies mucositis or sore throat Respiratory: Denies cough, dyspnea or wheezes Cardiovascular: Denies palpitation, chest discomfort Gastrointestinal:  Denies nausea, heartburn or change in bowel habits Skin: Denies abnormal skin rashes Lymphatics: Denies new lymphadenopathy or easy bruising Neurological:Denies numbness, tingling or new weaknesses Behavioral/Psych: Mood is stable, no new changes  Extremities: No lower extremity edema All other systems were reviewed with the patient and are negative.  I have reviewed the past medical history, past surgical history, social history and family history with the patient and they are unchanged from previous note.  ALLERGIES:  is allergic to penicillins.  MEDICATIONS:  No current outpatient medications on file.   No current facility-administered medications for this visit.     PHYSICAL EXAMINATION: ECOG PERFORMANCE STATUS: 1 - Symptomatic but completely ambulatory  Vitals:   03/23/17 1458  BP: 139/78  Pulse: 71  Resp: 18  Temp: 97.8 F (36.6 C)  SpO2: 99%   Filed Weights   03/23/17 1458  Weight: 103 lb 6.4 oz (46.9 kg)    GENERAL:alert, no distress and  comfortable SKIN: skin color, texture, turgor are normal, no rashes or significant lesions EYES: normal, Conjunctiva are pink and non-injected, sclera clear OROPHARYNX:no exudate, no erythema and lips, buccal mucosa, and tongue normal  NECK: supple, thyroid normal size, non-tender, without nodularity LYMPH:  no palpable lymphadenopathy in the cervical, axillary or inguinal LUNGS: clear to auscultation and percussion with normal breathing effort HEART: regular rate & rhythm and no murmurs and no lower extremity edema ABDOMEN:abdomen soft, non-tender and normal bowel sounds MUSCULOSKELETAL:no cyanosis of digits and no clubbing  NEURO: alert & oriented x 3 with fluent speech, no focal motor/sensory deficits EXTREMITIES: No lower extremity edema  LABORATORY DATA:  I have reviewed the data as listed   Chemistry      Component Value Date/Time   NA 138 04/28/2016 1617   K 4.7 04/28/2016 1617   CL 108 04/28/2016 1617   CO2 25 04/28/2016 1617   BUN 16 04/28/2016 1617   CREATININE 0.53 04/28/2016 1617      Component Value Date/Time   CALCIUM 9.7 04/28/2016 1617   ALKPHOS 66 04/28/2016 1617   AST 18 04/28/2016 1617   ALT 10 04/28/2016 1617   BILITOT 0.3 04/28/2016 1617       Lab Results  Component Value Date   WBC 10.7 (H) 03/23/2017   HGB 13.4 03/23/2017   HCT 40.7 03/23/2017   MCV 77.8 (L) 03/23/2017   PLT 606 (H) 03/23/2017   NEUTROABS 6.7 (H) 03/23/2017    ASSESSMENT & PLAN:  Hereditary hemochromatosis ([facility]) Hereditary hemachromatosis Genetic testing revealed homozygous C282Y mutation Current treatment: Phlebotomy with the goal to keep ferritin less than 100 and saturation less than  50%  Lab review: 03/24/2017:: Ferritin 10, iron saturation 10% with a hemoglobin of 13.4  Treatment plan: Last phlebotomy was 12/16/2016 I discussed with her that she does not need any current phlebotomy at this time. What is unusual about her case is that suddenly her ferritin went so low  and has remained low  Return to clinic in 2 months for follow-up with labs done ahead of time.  I spent 25 minutes talking to the patient of which more than half was spent in counseling and coordination of care.  Orders Placed This Encounter  Procedures  . CBC with Differential    Standing Status:   Future    Standing Expiration Date:   03/23/2018  . Ferritin    Standing Status:   Future    Standing Expiration Date:   03/23/2018  . Iron and TIBC    Standing Status:   Future    Standing Expiration Date:   03/23/2018   The patient has a good understanding of the overall plan. she agrees with it. she will call with any problems that may develop before the next visit here.   Rulon Eisenmenger, MD 03/24/17

## 2017-04-12 ENCOUNTER — Other Ambulatory Visit: Payer: Self-pay | Admitting: Internal Medicine

## 2017-04-12 DIAGNOSIS — Z1231 Encounter for screening mammogram for malignant neoplasm of breast: Secondary | ICD-10-CM

## 2017-05-25 ENCOUNTER — Encounter: Payer: Self-pay | Admitting: Internal Medicine

## 2017-06-16 ENCOUNTER — Telehealth: Payer: Self-pay | Admitting: *Deleted

## 2017-06-16 NOTE — Telephone Encounter (Signed)
Received VM regarding pt would like to reschedule next appt, noted in appointments- pt was able to get next appointment rescheduled.

## 2017-06-16 NOTE — Telephone Encounter (Signed)
"  I have appointment Monday with Dr. Lindi Adie I need to reschedule.  Will not be able to keep this appointment."  Transferred to scheduling option.  Message left for collaborative with cal information.

## 2017-06-19 ENCOUNTER — Other Ambulatory Visit: Payer: BLUE CROSS/BLUE SHIELD

## 2017-06-20 ENCOUNTER — Ambulatory Visit: Payer: BLUE CROSS/BLUE SHIELD | Admitting: Hematology and Oncology

## 2017-06-29 ENCOUNTER — Telehealth: Payer: Self-pay | Admitting: Hematology and Oncology

## 2017-06-29 NOTE — Telephone Encounter (Signed)
Spoke to patient regarding upcoming march appointment updates.

## 2017-07-06 ENCOUNTER — Other Ambulatory Visit: Payer: Self-pay

## 2017-07-07 ENCOUNTER — Ambulatory Visit: Payer: Self-pay | Admitting: Hematology and Oncology

## 2017-07-12 ENCOUNTER — Inpatient Hospital Stay: Payer: Self-pay | Attending: Hematology and Oncology

## 2017-07-12 DIAGNOSIS — Z880 Allergy status to penicillin: Secondary | ICD-10-CM | POA: Insufficient documentation

## 2017-07-12 DIAGNOSIS — Z79899 Other long term (current) drug therapy: Secondary | ICD-10-CM | POA: Insufficient documentation

## 2017-07-12 DIAGNOSIS — R55 Syncope and collapse: Secondary | ICD-10-CM | POA: Insufficient documentation

## 2017-07-12 LAB — CBC WITH DIFFERENTIAL/PLATELET
Basophils Absolute: 0 10*3/uL (ref 0.0–0.1)
Basophils Relative: 0 %
EOS ABS: 0.3 10*3/uL (ref 0.0–0.5)
Eosinophils Relative: 2 %
HEMATOCRIT: 43.7 % (ref 34.8–46.6)
HEMOGLOBIN: 14.8 g/dL (ref 11.6–15.9)
LYMPHS ABS: 2.5 10*3/uL (ref 0.9–3.3)
Lymphocytes Relative: 22 %
MCH: 30.5 pg (ref 25.1–34.0)
MCHC: 33.9 g/dL (ref 31.5–36.0)
MCV: 90.1 fL (ref 79.5–101.0)
MONOS PCT: 7 %
Monocytes Absolute: 0.8 10*3/uL (ref 0.1–0.9)
NEUTROS PCT: 69 %
Neutro Abs: 7.9 10*3/uL — ABNORMAL HIGH (ref 1.5–6.5)
Platelets: 527 10*3/uL — ABNORMAL HIGH (ref 145–400)
RBC: 4.85 MIL/uL (ref 3.70–5.45)
RDW: 16.6 % — ABNORMAL HIGH (ref 11.2–14.5)
WBC: 11.5 10*3/uL — ABNORMAL HIGH (ref 3.9–10.3)

## 2017-07-13 ENCOUNTER — Inpatient Hospital Stay (HOSPITAL_BASED_OUTPATIENT_CLINIC_OR_DEPARTMENT_OTHER): Payer: Self-pay | Admitting: Hematology and Oncology

## 2017-07-13 ENCOUNTER — Telehealth: Payer: Self-pay | Admitting: Hematology and Oncology

## 2017-07-13 VITALS — BP 138/86 | HR 80 | Temp 97.6°F | Resp 17 | Ht 60.0 in | Wt 107.3 lb

## 2017-07-13 DIAGNOSIS — R55 Syncope and collapse: Secondary | ICD-10-CM

## 2017-07-13 DIAGNOSIS — Z880 Allergy status to penicillin: Secondary | ICD-10-CM

## 2017-07-13 DIAGNOSIS — Z79899 Other long term (current) drug therapy: Secondary | ICD-10-CM

## 2017-07-13 LAB — IRON AND TIBC
Iron: 161 ug/dL — ABNORMAL HIGH (ref 41–142)
SATURATION RATIOS: 66 % — AB (ref 21–57)
TIBC: 243 ug/dL (ref 236–444)
UIBC: 82 ug/dL

## 2017-07-13 LAB — FERRITIN: Ferritin: 18 ng/mL (ref 9–269)

## 2017-07-13 NOTE — Assessment & Plan Note (Signed)
Hereditary hemachromatosis Genetic testing revealed homozygous C282Y mutation Current treatment: Phlebotomy with the goal to keep ferritin less than 100 and saturation less than 50%  Lab review:  03/24/2017:Ferritin 10, iron saturation 10% with a hemoglobin of 13.4 07/12/2017: Ferritin 18, iron saturation 66% with a hemoglobin of 14.8  Treatment plan: Last phlebotomy was 12/16/2016 I discussed with her that she does not need any current phlebotomy at this time. What is unusual about her case is that suddenly her ferritin went so low and has remained low  Return to clinic in 3 months for follow-up with labs done ahead of time.

## 2017-07-13 NOTE — Telephone Encounter (Signed)
Gave patient AVs and calendar. Referral sent to Neurology per 3/28 los.

## 2017-07-13 NOTE — Progress Notes (Signed)
Patient Care Team: Patient, No Pcp Per as PCP - General (General Practice) Nicholas Lose, MD as Consulting Physician (Hematology and Oncology)  DIAGNOSIS:  Encounter Diagnoses  Name Primary?  . Hereditary hemochromatosis ([place])   . Syncope, unspecified syncope type Yes    CHIEF COMPLIANT: Follow-up of hemochromatosis and syncope  INTERVAL HISTORY: Kara [name] is a 67 year old with above-mentioned history of hemochromatosis who last underwent phlebotomy in August 2018.  She has had no major problems lately with the exception of syncopal episodes.  She has had several episodes lately and is very worried about it.  We have not been able to identify the cause.  Previous investigations including a brain MRI did not show any particular etiology.  She would like to see neurology for the situation   REVIEW OF SYSTEMS:   Constitutional: Denies fevers, chills or abnormal weight loss Eyes: Denies blurriness of vision Ears, nose, mouth, throat, and face: Denies mucositis or sore throat Respiratory: Denies cough, dyspnea or wheezes Cardiovascular: Denies palpitation, chest discomfort Gastrointestinal:  Denies nausea, heartburn or change in bowel habits Skin: Denies abnormal skin rashes Lymphatics: Denies new lymphadenopathy or easy bruising Neurological: Episodes of syncope Behavioral/Psych: Mood is stable, no new changes  Extremities: No lower extremity edema Breast:  denies any pain or lumps or nodules in either breasts All other systems were reviewed with the patient and are negative.  I have reviewed the past medical history, past surgical history, social history and family history with the patient and they are unchanged from previous note.  ALLERGIES:  is allergic to penicillins.  MEDICATIONS:  No current outpatient medications on file.   No current facility-administered medications for this visit.     PHYSICAL EXAMINATION: ECOG PERFORMANCE STATUS: 1 - Symptomatic but completely  ambulatory  Vitals:   07/13/17 1515  BP: 138/86  Pulse: 80  Resp: 17  Temp: 97.6 F (36.4 C)  SpO2: 98%   Filed Weights   07/13/17 1515  Weight: 107 lb 4.8 oz (48.7 kg)    GENERAL:alert, no distress and comfortable SKIN: skin color, texture, turgor are normal, no rashes or significant lesions EYES: normal, Conjunctiva are pink and non-injected, sclera clear OROPHARYNX:no exudate, no erythema and lips, buccal mucosa, and tongue normal  NECK: supple, thyroid normal size, non-tender, without nodularity LYMPH:  no palpable lymphadenopathy in the cervical, axillary or inguinal LUNGS: clear to auscultation and percussion with normal breathing effort HEART: regular rate & rhythm and no murmurs and no lower extremity edema ABDOMEN:abdomen soft, non-tender and normal bowel sounds MUSCULOSKELETAL:no cyanosis of digits and no clubbing  NEURO: alert & oriented x 3 with fluent speech, no focal motor/sensory deficits EXTREMITIES: No lower extremity edema  LABORATORY DATA:  I have reviewed the data as listed CMP Latest Ref Rng & Units 04/28/2016 02/14/2013  Glucose 70 - 99 mg/dL 71 78  BUN 6 - 23 mg/dL 16 7  Creatinine 0.40 - 1.20 mg/dL 0.53 0.38(L)  Sodium 135 - 145 mEq/L 138 133(L)  Potassium 3.5 - 5.1 mEq/L 4.7 4.2  Chloride 96 - 112 mEq/L 108 95(L)  CO2 19 - 32 mEq/L 25 19  Calcium 8.4 - 10.5 mg/dL 9.7 9.9  Total Protein 6.0 - 8.3 g/dL 6.5 7.7  Total Bilirubin 0.2 - 1.2 mg/dL 0.3 0.6  Alkaline Phos 39 - 117 U/L 66 95  AST 0 - 37 U/L 18 117(H)  ALT 0 - 35 U/L 10 76(H)    Lab Results  Component Value Date   WBC 11.5 (  H) 07/12/2017   HGB 14.8 07/12/2017   HCT 43.7 07/12/2017   MCV 90.1 07/12/2017   PLT 527 (H) 07/12/2017   NEUTROABS 7.9 (H) 07/12/2017    ASSESSMENT & PLAN:  Hereditary hemochromatosis ([place]) Hereditary hemachromatosis Genetic testing revealed homozygous C282Y mutation Current treatment: Phlebotomy with the goal to keep ferritin less than 100 and saturation  less than 50%  Lab review:  03/24/2017:Ferritin 10, iron saturation 10% with a hemoglobin of 13.4 07/12/2017: Ferritin 18, iron saturation 66% with a hemoglobin of 14.8  Treatment plan: Last phlebotomy was 12/16/2016 I discussed with her that she does not need any current phlebotomy at this time. What is unusual about her case is that suddenly her ferritin went so low and has remained low  Syncopal episodes: I will send a referral for neurology consultation. Return to clinic in 2 months for follow-up with labs done ahead of time.      Orders Placed This Encounter  Procedures  . CBC with Differential ([facility] Center Only)    Standing Status:   Future    Standing Expiration Date:   07/14/2018  . Iron and TIBC    Standing Status:   Future    Standing Expiration Date:   07/13/2018  . Ferritin    Standing Status:   Future    Standing Expiration Date:   07/13/2018  . Ambulatory referral to Neurology    Referral Priority:   Routine    Referral Type:   Consultation    Referral Reason:   Specialty Services Required    Referred to Provider:   Marcial Pacas, MD    Requested Specialty:   Neurology    Number of Visits Requested:   1   The patient has a good understanding of the overall plan. she agrees with it. she will call with any problems that may develop before the next visit here.   Harriette Ohara, MD 07/13/17

## 2017-09-06 ENCOUNTER — Inpatient Hospital Stay: Payer: Self-pay | Attending: Hematology and Oncology

## 2017-09-07 ENCOUNTER — Telehealth: Payer: Self-pay | Admitting: Hematology and Oncology

## 2017-09-07 ENCOUNTER — Inpatient Hospital Stay: Payer: Self-pay | Admitting: Hematology and Oncology

## 2017-09-07 NOTE — Assessment & Plan Note (Deleted)
Hereditary hemachromatosis Genetic testing revealed homozygous C282Y mutation Current treatment: Phlebotomy with the goal to keep ferritin less than 100 and saturation less than 50%  Lab review: 03/24/2017:Ferritin10, iron saturation10% with a hemoglobin of 13.4 07/12/2017: Ferritin 18, iron saturation 66% with a hemoglobin of 14.8  Treatment plan:Last phlebotomy was 12/16/2016 I discussed with her that she does not need any current phlebotomy at this time. What is unusual about her case is that suddenly her ferritin went so low and has remained low  Syncopal episodes: See neurology   return to clinic in 2 months for follow-up with labs done ahead of time.

## 2017-09-07 NOTE — Telephone Encounter (Signed)
Patient called to reschedule  °

## 2017-09-07 NOTE — Progress Notes (Deleted)
Patient Care Team: Patient, No Pcp Per as PCP - General (General Practice) Nicholas Lose, MD as Consulting Physician (Hematology and Oncology)  DIAGNOSIS:  Encounter Diagnosis  Name Primary?  . Hereditary hemochromatosis ([place])     CHIEF COMPLIANT: Follow-up on hereditary hemochromatosis  INTERVAL HISTORY: Kara [name] is a 67 year old with above-mentioned history of hereditary hemochromatosis who has recently not required any phlebotomy.  She is here for a 94-month follow-up and reports to be feeling reasonably well.  She continues to have dizziness episodes for which she was referred to see neurology.  Other than that she is without any new complaints or concerns.  REVIEW OF SYSTEMS:   Constitutional: Denies fevers, chills or abnormal weight loss Eyes: Denies blurriness of vision Ears, nose, mouth, throat, and face: Denies mucositis or sore throat Respiratory: Denies cough, dyspnea or wheezes Cardiovascular: Denies palpitation, chest discomfort Gastrointestinal:  Denies nausea, heartburn or change in bowel habits Skin: Denies abnormal skin rashes Lymphatics: Denies new lymphadenopathy or easy bruising Neurological:Denies numbness, tingling or new weaknesses Behavioral/Psych: Mood is stable, no new changes  Extremities: No lower extremity edema  All other systems were reviewed with the patient and are negative.  I have reviewed the past medical history, past surgical history, social history and family history with the patient and they are unchanged from previous note.  ALLERGIES:  is allergic to penicillins.  MEDICATIONS:  No current outpatient medications on file.   No current facility-administered medications for this visit.     PHYSICAL EXAMINATION: ECOG PERFORMANCE STATUS: 1 - Symptomatic but completely ambulatory  There were no vitals filed for this visit. There were no vitals filed for this visit.  GENERAL:alert, no distress and comfortable SKIN: skin color,  texture, turgor are normal, no rashes or significant lesions EYES: normal, Conjunctiva are pink and non-injected, sclera clear OROPHARYNX:no exudate, no erythema and lips, buccal mucosa, and tongue normal  NECK: supple, thyroid normal size, non-tender, without nodularity LYMPH:  no palpable lymphadenopathy in the cervical, axillary or inguinal LUNGS: clear to auscultation and percussion with normal breathing effort HEART: regular rate & rhythm and no murmurs and no lower extremity edema ABDOMEN:abdomen soft, non-tender and normal bowel sounds MUSCULOSKELETAL:no cyanosis of digits and no clubbing  NEURO: alert & oriented x 3 with fluent speech, no focal motor/sensory deficits EXTREMITIES: No lower extremity edema   LABORATORY DATA:  I have reviewed the data as listed CMP Latest Ref Rng & Units 04/28/2016 02/14/2013  Glucose 70 - 99 mg/dL 71 78  BUN 6 - 23 mg/dL 16 7  Creatinine 0.40 - 1.20 mg/dL 0.53 0.38(L)  Sodium 135 - 145 mEq/L 138 133(L)  Potassium 3.5 - 5.1 mEq/L 4.7 4.2  Chloride 96 - 112 mEq/L 108 95(L)  CO2 19 - 32 mEq/L 25 19  Calcium 8.4 - 10.5 mg/dL 9.7 9.9  Total Protein 6.0 - 8.3 g/dL 6.5 7.7  Total Bilirubin 0.2 - 1.2 mg/dL 0.3 0.6  Alkaline Phos 39 - 117 U/L 66 95  AST 0 - 37 U/L 18 117(H)  ALT 0 - 35 U/L 10 76(H)    Lab Results  Component Value Date   WBC 11.5 (H) 07/12/2017   HGB 14.8 07/12/2017   HCT 43.7 07/12/2017   MCV 90.1 07/12/2017   PLT 527 (H) 07/12/2017   NEUTROABS 7.9 (H) 07/12/2017    ASSESSMENT & PLAN:  Hereditary hemochromatosis ([place]) Hereditary hemachromatosis Genetic testing revealed homozygous C282Y mutation Current treatment: Phlebotomy with the goal to keep ferritin less than 100  and saturation less than 50%  Lab review: 03/24/2017:Ferritin10, iron saturation10% with a hemoglobin of 13.4 07/12/2017: Ferritin 18, iron saturation 66% with a hemoglobin of 14.8  Treatment plan:Last phlebotomy was 12/16/2016 I discussed with her  that she does not need any current phlebotomy at this time. What is unusual about her case is that suddenly her ferritin went so low and has remained low  Syncopal episodes: See neurology   return to clinic in 2 months for follow-up with labs done ahead of time.      No orders of the defined types were placed in this encounter.  The patient has a good understanding of the overall plan. she agrees with it. she will call with any problems that may develop before the next visit here.   Harriette Ohara, MD 09/07/17

## 2017-09-18 ENCOUNTER — Inpatient Hospital Stay: Payer: Self-pay | Attending: Hematology and Oncology

## 2017-09-18 DIAGNOSIS — Z880 Allergy status to penicillin: Secondary | ICD-10-CM | POA: Insufficient documentation

## 2017-09-18 LAB — FERRITIN: Ferritin: 20 ng/mL (ref 9–269)

## 2017-09-18 LAB — CBC WITH DIFFERENTIAL (CANCER CENTER ONLY)
Basophils Absolute: 0 10*3/uL (ref 0.0–0.1)
Basophils Relative: 0 %
EOS ABS: 0.3 10*3/uL (ref 0.0–0.5)
EOS PCT: 3 %
HCT: 44.8 % (ref 34.8–46.6)
HEMOGLOBIN: 15.1 g/dL (ref 11.6–15.9)
Lymphocytes Relative: 22 %
Lymphs Abs: 2.5 10*3/uL (ref 0.9–3.3)
MCH: 31.1 pg (ref 25.1–34.0)
MCHC: 33.7 g/dL (ref 31.5–36.0)
MCV: 92.4 fL (ref 79.5–101.0)
MONOS PCT: 7 %
Monocytes Absolute: 0.8 10*3/uL (ref 0.1–0.9)
NEUTROS PCT: 68 %
Neutro Abs: 7.6 10*3/uL — ABNORMAL HIGH (ref 1.5–6.5)
PLATELETS: 650 10*3/uL — AB (ref 145–400)
RBC: 4.85 MIL/uL (ref 3.70–5.45)
RDW: 15.3 % — AB (ref 11.2–14.5)
WBC Count: 11.3 10*3/uL — ABNORMAL HIGH (ref 3.9–10.3)

## 2017-09-18 LAB — IRON AND TIBC
IRON: 104 ug/dL (ref 41–142)
SATURATION RATIOS: 38 % (ref 21–57)
TIBC: 275 ug/dL (ref 236–444)
UIBC: 171 ug/dL

## 2017-09-19 ENCOUNTER — Telehealth: Payer: Self-pay | Admitting: Hematology and Oncology

## 2017-09-19 ENCOUNTER — Inpatient Hospital Stay (HOSPITAL_BASED_OUTPATIENT_CLINIC_OR_DEPARTMENT_OTHER): Payer: Self-pay | Admitting: Hematology and Oncology

## 2017-09-19 NOTE — Assessment & Plan Note (Addendum)
Hereditary hemachromatosis Genetic testing revealed homozygous C282Y mutation Current treatment: Phlebotomy with the goal to keep ferritin less than 100 and saturation less than 50%  Lab review: 03/24/2017:Ferritin10, iron saturation10% with a hemoglobin of 13.4 07/12/2017: Ferritin 18, iron saturation 66% with a hemoglobin of 14.8 09/18/2017: Ferritin 20, iron saturation 38%, hemoglobin 15.1 Platelet count 650 secondary thrombocytosis due to phlebotomy   Treatment plan:Last phlebotomy was 12/16/2016  I discussed with the patient that she is still does not need any phlebotomy.  Return to clinic in 6 months with labs and follow-up

## 2017-09-19 NOTE — Telephone Encounter (Signed)
Gave patient AVS and calendar of upcoming December appointments.  °

## 2017-09-19 NOTE — Progress Notes (Signed)
Patient Care Team: Patient, No Pcp Per as PCP - General (General Practice) Nicholas Lose, MD as Consulting Physician (Hematology and Oncology)  DIAGNOSIS:  Encounter Diagnosis  Name Primary?  . Hereditary hemochromatosis ([place])     CHIEF COMPLIANT: Follow-up to review the labs related to hemochromatosis and iron studies  INTERVAL HISTORY: Kara [name] is a 67 year old with above-mentioned history of hereditary hemochromatosis who is currently on surveillance.  She has not had a phlebotomy since August 2019.  She is here for 52-month follow-up because in March she was noted to have an elevation of the iron saturation up to 66%.  She has not noticed any new symptoms or concerns.  Her dizziness/vertigo symptoms have subsided.  REVIEW OF SYSTEMS:   Constitutional: Denies fevers, chills or abnormal weight loss Eyes: Denies blurriness of vision Ears, nose, mouth, throat, and face: Denies mucositis or sore throat Respiratory: Denies cough, dyspnea or wheezes Cardiovascular: Denies palpitation, chest discomfort Gastrointestinal:  Denies nausea, heartburn or change in bowel habits Skin: Denies abnormal skin rashes Lymphatics: Denies new lymphadenopathy or easy bruising Neurological:Denies numbness, tingling or new weaknesses Behavioral/Psych: Mood is stable, no new changes  Extremities: No lower extremity edema All other systems were reviewed with the patient and are negative.  I have reviewed the past medical history, past surgical history, social history and family history with the patient and they are unchanged from previous note.  ALLERGIES:  is allergic to penicillins.  MEDICATIONS: Ibuprofen as needed No current outpatient medications on file.   No current facility-administered medications for this visit.     PHYSICAL EXAMINATION: ECOG PERFORMANCE STATUS: 1 - Symptomatic but completely ambulatory  Vitals:   09/19/17 1514  BP: 136/74  Pulse: 67  Resp: 17  Temp: 98 F (36.7  C)  SpO2: 99%   Filed Weights   09/19/17 1514  Weight: 105 lb 11.2 oz (47.9 kg)    GENERAL:alert, no distress and comfortable SKIN: skin color, texture, turgor are normal, no rashes or significant lesions EYES: normal, Conjunctiva are pink and non-injected, sclera clear OROPHARYNX:no exudate, no erythema and lips, buccal mucosa, and tongue normal  NECK: supple, thyroid normal size, non-tender, without nodularity LYMPH:  no palpable lymphadenopathy in the cervical, axillary or inguinal LUNGS: clear to auscultation and percussion with normal breathing effort HEART: regular rate & rhythm and no murmurs and no lower extremity edema ABDOMEN:abdomen soft, non-tender and normal bowel sounds MUSCULOSKELETAL:no cyanosis of digits and no clubbing  NEURO: alert & oriented x 3 with fluent speech, no focal motor/sensory deficits EXTREMITIES: No lower extremity edema   LABORATORY DATA:  I have reviewed the data as listed CMP Latest Ref Rng & Units 04/28/2016 02/14/2013  Glucose 70 - 99 mg/dL 71 78  BUN 6 - 23 mg/dL 16 7  Creatinine 0.40 - 1.20 mg/dL 0.53 0.38(L)  Sodium 135 - 145 mEq/L 138 133(L)  Potassium 3.5 - 5.1 mEq/L 4.7 4.2  Chloride 96 - 112 mEq/L 108 95(L)  CO2 19 - 32 mEq/L 25 19  Calcium 8.4 - 10.5 mg/dL 9.7 9.9  Total Protein 6.0 - 8.3 g/dL 6.5 7.7  Total Bilirubin 0.2 - 1.2 mg/dL 0.3 0.6  Alkaline Phos 39 - 117 U/L 66 95  AST 0 - 37 U/L 18 117(H)  ALT 0 - 35 U/L 10 76(H)    Lab Results  Component Value Date   WBC 11.3 (H) 09/18/2017   HGB 15.1 09/18/2017   HCT 44.8 09/18/2017   MCV 92.4 09/18/2017   PLT  650 (H) 09/18/2017   NEUTROABS 7.6 (H) 09/18/2017    ASSESSMENT & PLAN:  Hereditary hemochromatosis ([place]) Hereditary hemachromatosis Genetic testing revealed homozygous C282Y mutation Current treatment: Phlebotomy with the goal to keep ferritin less than 100 and saturation less than 50%  Lab review: 03/24/2017:Ferritin10, iron saturation10% with a hemoglobin  of 13.4 07/12/2017: Ferritin 18, iron saturation 66% with a hemoglobin of 14.8 09/18/2017: Ferritin 20, iron saturation 38%, hemoglobin 15.1 Platelet count 650 secondary thrombocytosis due to phlebotomy   Treatment plan:Last phlebotomy was 12/16/2016  I discussed with the patient that she is still does not need any phlebotomy.  Return to clinic in 6 months with labs and follow-up    Orders Placed This Encounter  Procedures  . Ferritin    Standing Status:   Future    Standing Expiration Date:   09/19/2018  . Iron and TIBC    Standing Status:   Future    Standing Expiration Date:   09/19/2018  . CBC with Differential (Cancer Center Only)    Standing Status:   Future    Standing Expiration Date:   09/20/2018   The patient has a good understanding of the overall plan. she agrees with it. she will call with any problems that may develop before the next visit here.   Harriette Ohara, MD 09/19/17

## 2017-10-17 ENCOUNTER — Ambulatory Visit: Payer: Self-pay | Admitting: Neurology

## 2017-11-21 ENCOUNTER — Encounter

## 2017-11-21 ENCOUNTER — Ambulatory Visit (INDEPENDENT_AMBULATORY_CARE_PROVIDER_SITE_OTHER): Payer: Medicare Other | Admitting: Neurology

## 2017-11-21 ENCOUNTER — Encounter: Payer: Self-pay | Admitting: Neurology

## 2017-11-21 VITALS — BP 144/87 | HR 66 | Ht 60.0 in | Wt 103.0 lb

## 2017-11-21 DIAGNOSIS — IMO0002 Reserved for concepts with insufficient information to code with codable children: Secondary | ICD-10-CM | POA: Insufficient documentation

## 2017-11-21 DIAGNOSIS — R42 Dizziness and giddiness: Secondary | ICD-10-CM

## 2017-11-21 DIAGNOSIS — G43709 Chronic migraine without aura, not intractable, without status migrainosus: Secondary | ICD-10-CM | POA: Diagnosis not present

## 2017-11-21 NOTE — Progress Notes (Signed)
PATIENT: Kara [name] DOB: 1950/08/30  Chief Complaint  Patient presents with  . Dizziness    Orthostatic Vitals:  Lying: 144/87, 66, Sitting: 148/89, 68, Standing: 144/87, 75, Standing x 3 minutes: 143/83, 75.  Reports intermittent episodes of feeling like she is going to pass out.  Symptoms started over six years ago.  She has only noticed that it is worse after sitting down for prolonged periods of time and then leaning forward.  Marland Kitchen PCP    Nicholas Lose, MD     HISTORICAL  Kara [name] is a 67 year old female, seen in request by primary care physician Dr. Lindi Adie, Loleta Dicker for evaluation of dizziness, initial evaluation was on November 21, 2017.  She had a history of hypertension, hemochromatosis, receiving phlebotomy regularly.  She did have a history of migraine in the past, sometimes preceded with visual aura without significant headaches,  Since 2012, she had recurrent similar episode of fairly quick onset of transient dizziness sensation, it is not vertigo, but she has sense of losing control, going to pass out, without actually lose consciousness, sometimes she would have headaches, often triggered by stress, fluorescent light, can lasting for few minutes, to hours, some episode of more severe than the others, she would have generalized weakness, fatigue, feeling wiped out afterwards.  During the spells, movement seems to make it worse, she will perform lie down, or sit down still the episode will quickly improve, she denies chest pain, heart palpitation during the spells,  Since 2018, she had extensive evaluation for her recurrent spells, I was able to review records from outside hospital,  30 days cardiac monitoring October 13 2017 was normal US carotid showed less than 39% stenosis of bilateral internal carotid artery ECHO: 60-65%.  Wall motion was nromal. EEG was normal in March 2018  personally reviewed MRI of the brain on July 17, 2016, mild supratentorium small vessel disease, no  acute abnormality.  REVIEW OF SYSTEMS: Full 14 system review of systems performed and notable only for seizure, snoring, confusion, headaches, snoring, weight gain, fatigue  ALLERGIES: Allergies  Allergen Reactions  . Penicillins Rash    HOME MEDICATIONS: No current outpatient medications on file.   No current facility-administered medications for this visit.     PAST MEDICAL HISTORY: Past Medical History:  Diagnosis Date  . Hemochromatosis   . Migraine     PAST SURGICAL HISTORY: Past Surgical History:  Procedure Laterality Date  . NASAL SEPTUM SURGERY      FAMILY HISTORY: Family History  Problem Relation Age of Onset  . Healthy Mother   . Acute myelogenous leukemia Father   . Bipolar disorder Sister   . Diabetes Neg Hx   . Cancer Neg Hx   . Heart failure Neg Hx   . Hyperlipidemia Neg Hx   . Hypertension Neg Hx     SOCIAL HISTORY: Social History   Socioeconomic History  . Marital status: Single    Spouse name: Not on file  . Number of children: 0  . Years of education: college  . Highest education level: Master's degree (e.g., MA, MS, MEng, MEd, MSW, MBA)  Occupational History  . 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 Financial resource strain: Not on file  . Food insecurity:    Worry: Not on file    Inability: Not on file  . Transportation needs:    Medical: Not on file    Non-medical: Not on file  Tobacco Use  .  Smoking status: Current Every Day Smoker    Packs/day: 1.00  . Smokeless tobacco: Never Used  Substance and Sexual Activity  . Alcohol use: No    Comment: former heavy drinker now sober  . Drug use: No  . Sexual activity: Not on file  Lifestyle  . Physical activity:    Days per week: Not on file    Minutes per session: Not on file  . Stress: Not on file  Relationships  . Social connections:    Talks on phone: Not on file    Gets together: Not on file    Attends religious service: Not on file    Active  member of club or organization: Not on file    Attends meetings of clubs or organizations: Not on file    Relationship status: Not on file  . Intimate partner violence:    Fear of current or ex partner: Not on file    Emotionally abused: Not on file    Physically abused: Not on file    Forced sexual activity: Not on file  Other Topics Concern  . Not on file  Social History Narrative   She is living with mother.    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   Highest level education:  Masters   Right-handed.   Caffeine use: 4 cups per day.     PHYSICAL EXAM   Vitals:   11/21/17 0808  BP: (!) 144/87  Pulse: 66  Weight: 103 lb (46.7 kg)  Height: 5' (1.524 m)    Not recorded      Body mass index is 20.12 kg/m.  PHYSICAL EXAMNIATION:  Gen: NAD, conversant, well nourised, obese, well groomed                     Cardiovascular: Regular rate rhythm, no peripheral edema, warm, nontender. Eyes: Conjunctivae clear without exudates or hemorrhage Neck: Supple, no carotid bruits. Pulmonary: Clear to auscultation bilaterally   NEUROLOGICAL EXAM:  MENTAL STATUS: Speech:    Speech is normal; fluent and spontaneous with normal comprehension.  Cognition:     Orientation to time, place and person     Normal recent and remote memory     Normal Attention span and concentration     Normal Language, naming, repeating,spontaneous speech     Fund of knowledge   CRANIAL NERVES: CN II: Visual fields are full to confrontation. Fundoscopic exam is normal with sharp discs and no vascular changes. Pupils are round equal and briskly reactive to light. CN III, IV, VI: extraocular movement are normal. No ptosis. CN V: Facial sensation is intact to pinprick in all 3 divisions bilaterally. Corneal responses are intact.  CN VII: Face is symmetric with normal eye closure and smile. CN VIII: Hearing is normal to rubbing fingers CN IX, X: Palate elevates symmetrically. Phonation is  normal. CN XI: Head turning and shoulder shrug are intact CN XII: Tongue is midline with normal movements and no atrophy.  MOTOR: There is no pronator drift of out-stretched arms. Muscle bulk and tone are normal. Muscle strength is normal.  REFLEXES: Reflexes are 2+ and symmetric at the biceps, triceps, knees, and ankles. Plantar responses are flexor.  SENSORY: Intact to light touch, pinprick, positional sensation and vibratory sensation are intact in fingers and toes.  COORDINATION: Rapid alternating movements and fine finger movements are intact. There is no dysmetria on finger-to-nose and heel-knee-shin.    GAIT/STANCE: Posture is normal. Gait is  steady with normal steps, base, arm swing, and turning. Heel and toe walking are normal. Tandem gait is normal.  Romberg is absent.   DIAGNOSTIC DATA (LABS, IMAGING, TESTING) - I reviewed patient records, labs, notes, testing and imaging myself where available.   ASSESSMENT AND PLAN  Kaelan Emami is a 67 y.o. female   Recurrent spells of transient dizziness, with occasional associated headaches,  Extensive cardiac work-up was normal, patient also reported she was checked during the spells by nurse, EMS, there was no cardiac arrhythmia found,  Differentiation diagnosis include migraine headaches,  Magnesium oxide, riboflavin twice a day as potential migraine prevention  Call clinic for worsening symptoms, NSAIDs or Tylenol as needed for headaches     Marcial Pacas, M.D. Ph.D.  [facility] [address] [place], [place] [address] Ph: [phone] Fax: [phone]  CC:  Nicholas Lose, MD

## 2017-11-21 NOTE — Patient Instructions (Signed)
Magnesium oxide 400mg  bid Riboflavin=B2 100mg  bid.

## 2018-03-19 ENCOUNTER — Telehealth: Payer: Self-pay

## 2018-03-19 ENCOUNTER — Inpatient Hospital Stay: Payer: Medicare Other

## 2018-03-19 NOTE — Telephone Encounter (Signed)
Returned patient's call.  Patient requesting to reschedule lab and follow up appointment.  Nurse was able to reschedule.  No further needs at this time.

## 2018-03-20 ENCOUNTER — Inpatient Hospital Stay: Payer: Medicare Other | Admitting: Hematology and Oncology

## 2018-04-04 ENCOUNTER — Telehealth: Payer: Self-pay | Admitting: Hematology and Oncology

## 2018-04-04 ENCOUNTER — Inpatient Hospital Stay: Payer: Medicare Other

## 2018-04-04 NOTE — Telephone Encounter (Signed)
R/s appt per pt request - 12/17 sch message - patient is aware of appt date and time.

## 2018-04-05 ENCOUNTER — Inpatient Hospital Stay: Payer: Medicare Other | Admitting: Hematology and Oncology

## 2018-04-25 ENCOUNTER — Inpatient Hospital Stay: Payer: Medicare Other

## 2018-04-26 ENCOUNTER — Inpatient Hospital Stay: Payer: Medicare Other | Admitting: Hematology and Oncology

## 2018-05-09 ENCOUNTER — Inpatient Hospital Stay: Payer: Medicare Other | Attending: Hematology and Oncology

## 2018-05-09 DIAGNOSIS — R7989 Other specified abnormal findings of blood chemistry: Secondary | ICD-10-CM | POA: Diagnosis not present

## 2018-05-09 DIAGNOSIS — D61818 Other pancytopenia: Secondary | ICD-10-CM | POA: Insufficient documentation

## 2018-05-09 LAB — CBC WITH DIFFERENTIAL (CANCER CENTER ONLY)
ABS IMMATURE GRANULOCYTES: 0.03 10*3/uL (ref 0.00–0.07)
Basophils Absolute: 0.1 10*3/uL (ref 0.0–0.1)
Basophils Relative: 1 %
EOS ABS: 0.3 10*3/uL (ref 0.0–0.5)
Eosinophils Relative: 3 %
HEMATOCRIT: 47.7 % — AB (ref 36.0–46.0)
Hemoglobin: 15.6 g/dL — ABNORMAL HIGH (ref 12.0–15.0)
Immature Granulocytes: 0 %
LYMPHS ABS: 2.4 10*3/uL (ref 0.7–4.0)
LYMPHS PCT: 21 %
MCH: 30.4 pg (ref 26.0–34.0)
MCHC: 32.7 g/dL (ref 30.0–36.0)
MCV: 92.8 fL (ref 80.0–100.0)
Monocytes Absolute: 0.8 10*3/uL (ref 0.1–1.0)
Monocytes Relative: 7 %
NEUTROS ABS: 7.6 10*3/uL (ref 1.7–7.7)
Neutrophils Relative %: 68 %
PLATELETS: 899 10*3/uL — AB (ref 150–400)
RBC: 5.14 MIL/uL — ABNORMAL HIGH (ref 3.87–5.11)
RDW: 15.1 % (ref 11.5–15.5)
WBC: 11.3 10*3/uL — AB (ref 4.0–10.5)
nRBC: 0 % (ref 0.0–0.2)

## 2018-05-09 LAB — IRON AND TIBC
Iron: 183 ug/dL — ABNORMAL HIGH (ref 41–142)
Saturation Ratios: 68 % — ABNORMAL HIGH (ref 21–57)
TIBC: 269 ug/dL (ref 236–444)
UIBC: 86 ug/dL — ABNORMAL LOW (ref 120–384)

## 2018-05-09 LAB — FERRITIN: Ferritin: 35 ng/mL (ref 11–307)

## 2018-05-09 NOTE — Progress Notes (Signed)
Patient Care Team: Patient, No Pcp Per as PCP - General (General Practice) Nicholas Lose, MD as Consulting Physician (Hematology and Oncology)  DIAGNOSIS:    ICD-10-CM   1. Thrombocytosis (HCC) D47.3 JAK2 (INCLUDING V617F AND EXON 12), MPL,& CALR W/RFL MPN PANEL (NGS)    bcr/abl-PCR    Iron and TIBC    Ferritin    CBC with Differential (Cancer Center Only)  2. Hereditary hemochromatosis ([place]) E83.110 JAK2 (INCLUDING V617F AND EXON 12), MPL,& CALR W/RFL MPN PANEL (NGS)    bcr/abl-PCR    Iron and TIBC    Ferritin    CBC with Differential (Cancer Center Only)    CHIEF COMPLIANT: Follow-up of hereditary hematochromatosis and recent labs  INTERVAL HISTORY: Kara [name] is a 68 y.o. with above-mentioned history of hereditary hemochromatosis who is currently on surveillance. She has not had a phlebotomy since August 2019. I last saw her 7 months ago. She presents to the clinic alone today and reports her dizzy spells have resolved. She reports she has been more tired lately but attributes it to difficulty sleeping. Her lab work from yesterday shows ferritin 35, iron saturation 68%, WBC 11.3, Hg 15.6, platelets 899, ANC 7.6. She reports she eats a gluten free diet and very little meat. She takes a small dose aspirin daily. Her father had acute myelogenous leukemia. She has made a resolution to focus on her health this year and start yoga. She will return for a bone marrow biopsy on Monday  REVIEW OF SYSTEMS:   Constitutional: Denies fevers, chills or abnormal weight loss (+) fatigue Eyes: Denies blurriness of vision Ears, nose, mouth, throat, and face: Denies mucositis or sore throat Respiratory: Denies cough, dyspnea or wheezes Cardiovascular: Denies palpitation, chest discomfort Gastrointestinal: Denies nausea, heartburn or change in bowel habits Skin: Denies abnormal skin rashes Lymphatics: Denies new lymphadenopathy or easy bruising Neurological: Denies numbness, tingling or new  weaknesses Behavioral/Psych: Mood is stable, no new changes (+) difficulty sleeping Extremities: No lower extremity edema Breast: denies any pain or lumps or nodules in either breasts All other systems were reviewed with the patient and are negative.  I have reviewed the past medical history, past surgical history, social history and family history with the patient and they are unchanged from previous note.  ALLERGIES:  is allergic to penicillins.  MEDICATIONS:  No current outpatient medications on file.   No current facility-administered medications for this visit.     PHYSICAL EXAMINATION: ECOG PERFORMANCE STATUS: 1 - Symptomatic but completely ambulatory  Vitals:   05/10/18 0831  BP: 140/81  Pulse: 75  Resp: 16  Temp: 97.8 F (36.6 C)  SpO2: 97%   Filed Weights   05/10/18 0831  Weight: 103 lb 12.8 oz (47.1 kg)    GENERAL: alert, no distress and comfortable SKIN: skin color, texture, turgor are normal, no rashes or significant lesions EYES: normal, Conjunctiva are pink and non-injected, sclera clear OROPHARYNX: no exudate, no erythema and lips, buccal mucosa, and tongue normal  NECK: supple, thyroid normal size, non-tender, without nodularity LYMPH: no palpable lymphadenopathy in the cervical, axillary or inguinal LUNGS: clear to auscultation and percussion with normal breathing effort HEART: regular rate & rhythm and no murmurs and no lower extremity edema ABDOMEN: abdomen soft, non-tender and normal bowel sounds MUSCULOSKELETAL: no cyanosis of digits and no clubbing  NEURO: alert & oriented x 3 with fluent speech, no focal motor/sensory deficits EXTREMITIES: No lower extremity edema  LABORATORY DATA:  I have reviewed the data as  listed CMP Latest Ref Rng & Units 04/28/2016 02/14/2013  Glucose 70 - 99 mg/dL 71 78  BUN 6 - 23 mg/dL 16 7  Creatinine 0.40 - 1.20 mg/dL 0.53 0.38(L)  Sodium 135 - 145 mEq/L 138 133(L)  Potassium 3.5 - 5.1 mEq/L 4.7 4.2  Chloride 96 -  112 mEq/L 108 95(L)  CO2 19 - 32 mEq/L 25 19  Calcium 8.4 - 10.5 mg/dL 9.7 9.9  Total Protein 6.0 - 8.3 g/dL 6.5 7.7  Total Bilirubin 0.2 - 1.2 mg/dL 0.3 0.6  Alkaline Phos 39 - 117 U/L 66 95  AST 0 - 37 U/L 18 117(H)  ALT 0 - 35 U/L 10 76(H)    Lab Results  Component Value Date   WBC 11.3 (H) 05/09/2018   HGB 15.6 (H) 05/09/2018   HCT 47.7 (H) 05/09/2018   MCV 92.8 05/09/2018   PLT 899 (H) 05/09/2018   NEUTROABS 7.6 05/09/2018    ASSESSMENT & PLAN:  Hereditary hemochromatosis ([place]) Hereditary hemachromatosis Genetic testing revealed homozygous C282Y mutation Current treatment: Phlebotomy with the goal to keep ferritin less than 100 and saturation less than 50%  Lab review: 03/24/2017:Ferritin10, iron saturation10% with a hemoglobin of 13.4 07/12/2017: Ferritin 18, iron saturation 66% with a hemoglobin of 14.8 09/18/2017: Ferritin 20, iron saturation 38%, hemoglobin 15.1 05/09/2018: Ferritin: 35, iron saturation 68%, hemoglobin 15.6, platelets 899 (increased from 650)  Treatment plan:Last phlebotomy was 12/16/2016  I discussed with the patient that she is still does not need any phlebotomy. There is a discrepancy between iron saturation and the ferritin levels.  The bone marrow biopsy that we have planned for her can help [place] understand if she has excess iron in the bone marrow.  I will repeat the iron saturation with her next blood work.  Thrombocytosis: Could be due to myeloproliferative disorder.  Given the fact that it continues to climb up, I would like to send for JAK-2 and BCR-ABL mutation and consider doing a bone marrow biopsy on 05/14/2018.  We will do blood work on the day after bone marrow biopsy.  Return to clinic 1 week after the bone marrow biopsy to discuss the report.    Orders Placed This Encounter  Procedures  . JAK2 (INCLUDING V617F AND EXON 12), MPL,& CALR W/RFL MPN PANEL (NGS)    Standing Status:   Future    Standing Expiration Date:   05/10/2019  .  bcr/abl-PCR    Standing Status:   Future    Standing Expiration Date:   05/11/2019  . Iron and TIBC    Standing Status:   Future    Standing Expiration Date:   05/10/2019  . Ferritin    Standing Status:   Future    Standing Expiration Date:   05/10/2019  . CBC with Differential (Cancer Center Only)    Standing Status:   Future    Standing Expiration Date:   05/11/2019   The patient has a good understanding of the overall plan. she agrees with it. she will call with any problems that may develop before the next visit here.  Nicholas Lose, MD 05/10/2018  Julious Oka Dorshimer am acting as scribe for Dr. Nicholas Lose.  I have reviewed the above documentation for accuracy and completeness, and I agree with the above.

## 2018-05-10 ENCOUNTER — Telehealth: Payer: Self-pay | Admitting: *Deleted

## 2018-05-10 ENCOUNTER — Inpatient Hospital Stay (HOSPITAL_BASED_OUTPATIENT_CLINIC_OR_DEPARTMENT_OTHER): Payer: Medicare Other | Admitting: Hematology and Oncology

## 2018-05-10 VITALS — BP 140/81 | HR 75 | Temp 97.8°F | Resp 16 | Ht 60.0 in | Wt 103.8 lb

## 2018-05-10 DIAGNOSIS — R7989 Other specified abnormal findings of blood chemistry: Secondary | ICD-10-CM

## 2018-05-10 DIAGNOSIS — D75839 Thrombocytosis, unspecified: Secondary | ICD-10-CM

## 2018-05-10 DIAGNOSIS — D61818 Other pancytopenia: Secondary | ICD-10-CM | POA: Diagnosis not present

## 2018-05-10 DIAGNOSIS — D473 Essential (hemorrhagic) thrombocythemia: Secondary | ICD-10-CM

## 2018-05-10 NOTE — Assessment & Plan Note (Signed)
Hereditary hemachromatosis Genetic testing revealed homozygous C282Y mutation Current treatment: Phlebotomy with the goal to keep ferritin less than 100 and saturation less than 50%  Lab review: 03/24/2017:Ferritin10, iron saturation10% with a hemoglobin of 13.4 07/12/2017: Ferritin 18, iron saturation 66% with a hemoglobin of 14.8 09/18/2017: Ferritin 20, iron saturation 38%, hemoglobin 15.1 05/09/2018: Ferritin: 35, iron saturation 68%, hemoglobin 15.6, platelets 899 (increased from 650)  Treatment plan:Last phlebotomy was 12/16/2016  I discussed with the patient that she is still does not need any phlebotomy. Thrombocytosis: Most likely reactive.  Given the fact that it continues to climb up, I would like to send for JAK-2 mutation and consider doing a bone marrow biopsy  Return to clinic

## 2018-05-10 NOTE — Telephone Encounter (Signed)
Per MD request BMBX scheduled in the treatment room at 8am . Flow scheduled per [facility] at [facility] at [facility].  Above procedure discussed with pt including request to arrive by 730 am for check in and prep for BMBX.

## 2018-05-11 ENCOUNTER — Telehealth: Payer: Self-pay | Admitting: Hematology and Oncology

## 2018-05-11 NOTE — Telephone Encounter (Signed)
Called patient per 1/23 sch message - unable to reach or leave message.

## 2018-05-14 ENCOUNTER — Other Ambulatory Visit: Payer: Self-pay

## 2018-05-14 ENCOUNTER — Inpatient Hospital Stay: Payer: Medicare Other

## 2018-05-14 ENCOUNTER — Inpatient Hospital Stay (HOSPITAL_BASED_OUTPATIENT_CLINIC_OR_DEPARTMENT_OTHER): Payer: Medicare Other | Admitting: Hematology and Oncology

## 2018-05-14 ENCOUNTER — Other Ambulatory Visit: Payer: Self-pay | Admitting: *Deleted

## 2018-05-14 VITALS — BP 143/93 | HR 65 | Temp 98.0°F | Resp 16

## 2018-05-14 DIAGNOSIS — D473 Essential (hemorrhagic) thrombocythemia: Secondary | ICD-10-CM | POA: Diagnosis not present

## 2018-05-14 DIAGNOSIS — D61818 Other pancytopenia: Secondary | ICD-10-CM

## 2018-05-14 DIAGNOSIS — D7589 Other specified diseases of blood and blood-forming organs: Secondary | ICD-10-CM | POA: Diagnosis not present

## 2018-05-14 DIAGNOSIS — D75839 Thrombocytosis, unspecified: Secondary | ICD-10-CM

## 2018-05-14 DIAGNOSIS — R7989 Other specified abnormal findings of blood chemistry: Secondary | ICD-10-CM | POA: Diagnosis not present

## 2018-05-14 LAB — CBC WITH DIFFERENTIAL (CANCER CENTER ONLY)
ABS IMMATURE GRANULOCYTES: 0.04 10*3/uL (ref 0.00–0.07)
Basophils Absolute: 0.1 10*3/uL (ref 0.0–0.1)
Basophils Relative: 1 %
EOS PCT: 3 %
Eosinophils Absolute: 0.3 10*3/uL (ref 0.0–0.5)
HEMATOCRIT: 46.3 % — AB (ref 36.0–46.0)
Hemoglobin: 15.2 g/dL — ABNORMAL HIGH (ref 12.0–15.0)
Immature Granulocytes: 0 %
LYMPHS ABS: 2.3 10*3/uL (ref 0.7–4.0)
Lymphocytes Relative: 21 %
MCH: 30.2 pg (ref 26.0–34.0)
MCHC: 32.8 g/dL (ref 30.0–36.0)
MCV: 91.9 fL (ref 80.0–100.0)
MONO ABS: 0.8 10*3/uL (ref 0.1–1.0)
MONOS PCT: 8 %
Neutro Abs: 7.2 10*3/uL (ref 1.7–7.7)
Neutrophils Relative %: 67 %
Platelet Count: 895 10*3/uL — ABNORMAL HIGH (ref 150–400)
RBC: 5.04 MIL/uL (ref 3.87–5.11)
RDW: 15.3 % (ref 11.5–15.5)
WBC Count: 10.7 10*3/uL — ABNORMAL HIGH (ref 4.0–10.5)
nRBC: 0 % (ref 0.0–0.2)

## 2018-05-14 LAB — IRON AND TIBC
Iron: 145 ug/dL — ABNORMAL HIGH (ref 41–142)
Saturation Ratios: 56 % (ref 21–57)
TIBC: 261 ug/dL (ref 236–444)
UIBC: 116 ug/dL — AB (ref 120–384)

## 2018-05-14 LAB — FERRITIN: Ferritin: 43 ng/mL (ref 11–307)

## 2018-05-14 NOTE — Patient Instructions (Signed)

## 2018-05-14 NOTE — Progress Notes (Signed)
At D/C pt site is clean, dry and intact. VSS and pt understands discharge instructions.

## 2018-05-15 ENCOUNTER — Telehealth: Payer: Self-pay | Admitting: Hematology and Oncology

## 2018-05-15 NOTE — Progress Notes (Signed)
INDICATION:pancytopenia  Brief examination was performed. Heart: regular rate and rhythm.No Murmurs Lungs: clear to auscultation, no wheezes, normal respiratory effort  Bone Marrow Biopsy and Aspiration Procedure Note   Informed consent was obtained and potential risks including bleeding, infection and pain were reviewed with the patient.  The patient's name, date of birth, identification, consent and allergies were verified prior to the start of procedure and time out was performed.  The left posterior iliac crest was chosen as the site of biopsy.  The skin was prepped with ChloraPrep.   8 cc of 1% lidocaine was used to provide local anaesthesia.   10 cc of bone marrow aspirate was obtained followed by 1cm biopsy.  Pressure was applied to the biopsy site and bandage was placed over the biopsy site. Patient was made to lie on the back for 15 mins prior to discharge.  The procedure was tolerated well. COMPLICATIONS: None BLOOD LOSS: none The patient was discharged home in stable condition with a 1 week follow up to review results.  Patient was provided with post bone marrow biopsy instructions and instructed to call if there was any bleeding or worsening pain.  Specimens sent for flow cytometry, cytogenetics and additional studies.  Signed Harriette Ohara, MD

## 2018-05-15 NOTE — Telephone Encounter (Signed)
No los °

## 2018-05-17 ENCOUNTER — Telehealth: Payer: Self-pay | Admitting: Hematology and Oncology

## 2018-05-17 NOTE — Progress Notes (Signed)
Patient Care Team: Patient, No Pcp Per as PCP - General (General Practice) Nicholas Lose, MD as Consulting Physician (Hematology and Oncology)  DIAGNOSIS:    ICD-10-CM   1. Essential thrombocytosis (HCC) D47.3 Iron and TIBC    Ferritin    CBC with Differential (Cancer Center Only)    CHIEF COMPLIANT: Follow-up to discuss bone marrow biopsy results showing essential thrombocytosis  INTERVAL HISTORY: Kara [name] is a 68 y.o. with above-mentioned history of hereditary hemochromatosis who is currently on surveillance. A bone marrow biopsy on 05/14/18 showed megakaryocytic hyperplasia suggestive of essential thrombocythemia. She presents to the clinic alone today and reports soreness and a small nodule at the site of the bone marrow biopsy. She feels well otherwise, but notes some stress and difficulty sleeping through the night. She is currently taking 22m of aspirin daily. She notes swollen joints in her fingers recently and one episode of tingling, numbness, and itching in her foot that resolved with Ibuprofen. She was worried about the diagnosis being pre-cancerous and about management of her hemachromatosis in addition to thrombocytosis.    REVIEW OF SYSTEMS:   Constitutional: Denies fevers, chills or abnormal weight loss Eyes: Denies blurriness of vision Ears, nose, mouth, throat, and face: Denies mucositis or sore throat Respiratory: Denies cough, dyspnea or wheezes Cardiovascular: Denies palpitation, chest discomfort Gastrointestinal:  Denies nausea, heartburn or change in bowel habits Skin: Denies abnormal skin rashes MSK: (+) soreness at biopsy site (+) joint swelling in fingers Lymphatics: Denies new lymphadenopathy or easy bruising Neurological: Denies new weaknesses (+) numbness, tingling, itching in feet Behavioral/Psych: Mood is stable, no new changes (+) difficulty sleeping Extremities: No lower extremity edema Breast: denies any pain or lumps or nodules in either  breasts All other systems were reviewed with the patient and are negative.  I have reviewed the past medical history, past surgical history, social history and family history with the patient and they are unchanged from previous note.  ALLERGIES:  is allergic to penicillins.  MEDICATIONS:  No current outpatient medications on file.   No current facility-administered medications for this visit.     PHYSICAL EXAMINATION: ECOG PERFORMANCE STATUS: 1 - Symptomatic but completely ambulatory  Vitals:   05/22/18 1343  BP: 129/86  Pulse: 81  Resp: 16  Temp: 98.4 F (36.9 C)  SpO2: 98%   Filed Weights   05/22/18 1343  Weight: 47.6 kg    GENERAL: alert, no distress and comfortable SKIN: skin color, texture, turgor are normal, no rashes or significant lesions EYES: normal, Conjunctiva are pink and non-injected, sclera clear OROPHARYNX: no exudate, no erythema and lips, buccal mucosa, and tongue normal  NECK: supple, thyroid normal size, non-tender, without nodularity LYMPH: no palpable lymphadenopathy in the cervical, axillary or inguinal LUNGS: clear to auscultation and percussion with normal breathing effort HEART: regular rate & rhythm and no murmurs and no lower extremity edema ABDOMEN: abdomen soft, non-tender and normal bowel sounds MUSCULOSKELETAL: no cyanosis of digits and no clubbing  NEURO: alert & oriented x 3 with fluent speech, no focal motor/sensory deficits EXTREMITIES: No lower extremity edema  LABORATORY DATA:  I have reviewed the data as listed CMP Latest Ref Rng & Units 04/28/2016 02/14/2013  Glucose 70 - 99 mg/dL 71 78  BUN 6 - 23 mg/dL 16 7  Creatinine 0.40 - 1.20 mg/dL 0.53 0.38(L)  Sodium 135 - 145 mEq/L 138 133(L)  Potassium 3.5 - 5.1 mEq/L 4.7 4.2  Chloride 96 - 112 mEq/L 108 95(L)  CO2 19 -  32 mEq/L 25 19  Calcium 8.4 - 10.5 mg/dL 9.7 9.9  Total Protein 6.0 - 8.3 g/dL 6.5 7.7  Total Bilirubin 0.2 - 1.2 mg/dL 0.3 0.6  Alkaline Phos 39 - 117 U/L 66  95  AST 0 - 37 U/L 18 117(H)  ALT 0 - 35 U/L 10 76(H)    Lab Results  Component Value Date   WBC 10.7 (H) 05/14/2018   HGB 15.2 (H) 05/14/2018   HCT 46.3 (H) 05/14/2018   MCV 91.9 05/14/2018   PLT 895 (H) 05/14/2018   NEUTROABS 7.2 05/14/2018    ASSESSMENT & PLAN:  Essential thrombocytosis ([place]) Bone marrow biopsy 05/22/2018: Megakaryocytic hyperplasia suggestive of essential thrombocythemia/myeloproliferative neoplasm. BCR/ABL, JAK2 and MPN panel are pending  Counseling: I discussed with the patient the condition of myeloproliferative disorders and essential thrombocythemia. This is completely separate from her diagnosis of hemochromatosis. We discussed the treatment options for essential thrombocythemia depends upon risk factors which include history of clots as well as the absolute platelet count.  Treatment options: A. For low risk patients, (platelet counts less than 1000 and no history of blood clots) the treatment would be with aspirin therapy B. for high risk patients(platelet counts greater than 1000/history of blood clot) the treatment would be platelet lowering therapy along with aspirin  Treatment plan: Aspirin 81 mg daily  Return to clinic in 3 months with labs and follow-up.       Orders Placed This Encounter  Procedures  . Iron and TIBC    Standing Status:   Future    Standing Expiration Date:   05/22/2019  . Ferritin    Standing Status:   Future    Standing Expiration Date:   05/22/2019  . CBC with Differential (Cancer Center Only)    Standing Status:   Future    Standing Expiration Date:   05/23/2019   The patient has a good understanding of the overall plan. she agrees with it. she will call with any problems that may develop before the next visit here.  Nicholas Lose, MD 05/22/2018  Julious Oka Dorshimer am acting as scribe for Dr. Nicholas Lose.  I have reviewed the above documentation for accuracy and completeness, and I agree with the above.

## 2018-05-17 NOTE — Telephone Encounter (Signed)
VG PAL 2/3 - moved f/u to 2/4. Spoke with patient.

## 2018-05-21 ENCOUNTER — Inpatient Hospital Stay: Payer: Medicare Other | Admitting: Hematology and Oncology

## 2018-05-22 ENCOUNTER — Telehealth: Payer: Self-pay | Admitting: Hematology and Oncology

## 2018-05-22 ENCOUNTER — Inpatient Hospital Stay: Payer: Medicare Other | Attending: Hematology and Oncology | Admitting: Hematology and Oncology

## 2018-05-22 DIAGNOSIS — D473 Essential (hemorrhagic) thrombocythemia: Secondary | ICD-10-CM | POA: Diagnosis not present

## 2018-05-22 DIAGNOSIS — Z7982 Long term (current) use of aspirin: Secondary | ICD-10-CM

## 2018-05-22 NOTE — Assessment & Plan Note (Signed)
Bone marrow biopsy 05/22/2018: Megakaryocytic hyperplasia suggestive of essential thrombocythemia/myeloproliferative neoplasm. BCR/ABL, JAK2 and MPN panel are pending  Counseling: I discussed with the patient the condition of myeloproliferative disorders and essential thrombocythemia. This is completely separate from her diagnosis of hemochromatosis. We discussed the treatment options for essential thrombocythemia depends upon risk factors which include history of clots as well as the absolute platelet count.  Treatment options: A. For low risk patients, (platelet counts less than 1000 and no history of blood clots) the treatment would be with aspirin therapy B. for high risk patients(platelet counts greater than 1000/history of blood clot) the treatment would be platelet lowering therapy along with aspirin  Treatment plan: Aspirin 81 mg daily  Return to clinic in 3 months with labs and follow-up.

## 2018-05-22 NOTE — Telephone Encounter (Signed)
Gave avs and calendar ° °

## 2018-05-25 ENCOUNTER — Encounter (HOSPITAL_COMMUNITY): Payer: Self-pay | Admitting: Hematology and Oncology

## 2018-05-30 LAB — JAK2 (INCLUDING V617F AND EXON 12), MPL,& CALR W/RFL MPN PANEL (NGS)

## 2018-05-30 LAB — BCR ABL1 FISH (GENPATH)

## 2018-07-03 DIAGNOSIS — D229 Melanocytic nevi, unspecified: Secondary | ICD-10-CM | POA: Diagnosis not present

## 2018-07-03 DIAGNOSIS — L814 Other melanin hyperpigmentation: Secondary | ICD-10-CM | POA: Diagnosis not present

## 2018-07-03 DIAGNOSIS — L570 Actinic keratosis: Secondary | ICD-10-CM | POA: Diagnosis not present

## 2018-08-20 ENCOUNTER — Inpatient Hospital Stay: Payer: Medicare Other

## 2018-08-22 ENCOUNTER — Inpatient Hospital Stay: Payer: Medicare Other | Admitting: Hematology and Oncology

## 2018-09-07 NOTE — Assessment & Plan Note (Signed)
Thrombocytosis:  Bone marrow biopsy 05/22/2018: Megakaryocytic hyperplasia suggestive of essential thrombocythemia/myeloproliferative neoplasm. JAK-2 V617F Positive Current treatment: Aspirin 81 mg daily Labs: Platelet count: 895

## 2018-09-07 NOTE — Assessment & Plan Note (Signed)
Hereditary hemachromatosis Genetic testing revealed homozygous C282Y mutation Current treatment: Phlebotomy with the goal to keep ferritin less than 100 and saturation less than 50%  Lab review: 03/24/2017:Ferritin10, iron saturation10% with a hemoglobin of 13.4 07/12/2017: Ferritin 18, iron saturation 66% with a hemoglobin of 14.8 09/18/2017: Ferritin 20, iron saturation 38%, hemoglobin 15.1 05/09/2018: Ferritin: 35, iron saturation 68%, hemoglobin 15.6, platelets 899 (increased from 650)  Treatment plan:Last phlebotomy was 12/16/2016  I discussed with the patient that she is still does not need any phlebotomy.  Return to clinic

## 2018-09-11 ENCOUNTER — Telehealth: Payer: Self-pay | Admitting: Hematology and Oncology

## 2018-09-11 NOTE — Telephone Encounter (Signed)
Called patient regarding upcoming Webex appointment, per patient's request this needs to be a telephone visit. °

## 2018-09-13 ENCOUNTER — Other Ambulatory Visit: Payer: Self-pay

## 2018-09-13 ENCOUNTER — Telehealth: Payer: Self-pay | Admitting: Hematology and Oncology

## 2018-09-13 ENCOUNTER — Inpatient Hospital Stay: Payer: Medicare Other | Attending: Hematology and Oncology

## 2018-09-13 DIAGNOSIS — Z79899 Other long term (current) drug therapy: Secondary | ICD-10-CM | POA: Diagnosis not present

## 2018-09-13 DIAGNOSIS — Z7982 Long term (current) use of aspirin: Secondary | ICD-10-CM | POA: Insufficient documentation

## 2018-09-13 DIAGNOSIS — D473 Essential (hemorrhagic) thrombocythemia: Secondary | ICD-10-CM

## 2018-09-13 LAB — CBC WITH DIFFERENTIAL (CANCER CENTER ONLY)
Abs Immature Granulocytes: 0.05 10*3/uL (ref 0.00–0.07)
Basophils Absolute: 0.1 10*3/uL (ref 0.0–0.1)
Basophils Relative: 1 %
Eosinophils Absolute: 0.3 10*3/uL (ref 0.0–0.5)
Eosinophils Relative: 2 %
HCT: 47.9 % — ABNORMAL HIGH (ref 36.0–46.0)
Hemoglobin: 16 g/dL — ABNORMAL HIGH (ref 12.0–15.0)
Immature Granulocytes: 0 %
Lymphocytes Relative: 24 %
Lymphs Abs: 2.7 10*3/uL (ref 0.7–4.0)
MCH: 30.5 pg (ref 26.0–34.0)
MCHC: 33.4 g/dL (ref 30.0–36.0)
MCV: 91.2 fL (ref 80.0–100.0)
Monocytes Absolute: 0.8 10*3/uL (ref 0.1–1.0)
Monocytes Relative: 7 %
Neutro Abs: 7.3 10*3/uL (ref 1.7–7.7)
Neutrophils Relative %: 66 %
Platelet Count: 912 10*3/uL (ref 150–400)
RBC: 5.25 MIL/uL — ABNORMAL HIGH (ref 3.87–5.11)
RDW: 15.9 % — ABNORMAL HIGH (ref 11.5–15.5)
WBC Count: 11.1 10*3/uL — ABNORMAL HIGH (ref 4.0–10.5)
nRBC: 0 % (ref 0.0–0.2)

## 2018-09-13 LAB — IRON AND TIBC
Iron: 176 ug/dL — ABNORMAL HIGH (ref 41–142)
Saturation Ratios: 71 % — ABNORMAL HIGH (ref 21–57)
TIBC: 249 ug/dL (ref 236–444)
UIBC: 73 ug/dL — ABNORMAL LOW (ref 120–384)

## 2018-09-13 LAB — FERRITIN: Ferritin: 44 ng/mL (ref 11–307)

## 2018-09-13 NOTE — Progress Notes (Signed)
  HEMATOLOGY-ONCOLOGY TELEPHONE VISIT PROGRESS NOTE  I connected with Kara [name] on 09/14/2018 at  8:30 AM EDT by telephone and verified that I am speaking with the correct person using two identifiers.  I discussed the limitations, risks, security and privacy concerns of performing an evaluation and management service by telephone and the availability of in person appointments.  I also discussed with the patient that there may be a patient responsible charge related to this service. The patient expressed understanding and agreed to proceed.   History of Present Illness: Kara [name] is a 68 y.o. female with above-mentioned history of thrombocytosis and hereditary hemochromatosis who is currently on 91m Aspirin. Labs on 09/13/18 showed: WBC 11.1, Hg 16.0, HCT 47.9, platelets 912,000, ferritin 44, iron saturation 71%. She is over the phone today to review her lab work.   Observations/Objective:  No further dizzy spells   Assessment Plan:  Hereditary hemochromatosis ([place] Hereditary hemachromatosis Genetic testing revealed homozygous C282Y mutation Current treatment: Phlebotomy with the goal to keep ferritin less than 100 and saturation less than 50%  Lab review: 03/24/2017:Ferritin10, iron saturation10% with a hemoglobin of 13.4 07/12/2017: Ferritin 18, iron saturation 66% with a hemoglobin of 14.8 09/18/2017: Ferritin 20, iron saturation 38%, hemoglobin 15.1 05/09/2018: Ferritin: 35, iron saturation 68%, hemoglobin 15.6, platelets 899 (increased from 650) 09/13/18: Ferritin 45 Treatment plan:Last phlebotomy was 12/16/2016  I discussed with the patient that she is still does not need any phlebotomy.   Essential thrombocytosis (HCC) Thrombocytosis:  Bone marrow biopsy 05/22/2018: Megakaryocytic hyperplasia suggestive of essential thrombocythemia/myeloproliferative neoplasm. JAK-2 V617F Positive Current treatment: Aspirin 81 mg daily Counseled her on starting Anagrelide 1 mg daily. Will  need to titrate it Labs: Platelet count: 912  2 month labs and follow up through [facility]  I discussed the assessment and treatment plan with the patient. The patient was provided an opportunity to ask questions and all were answered. The patient agreed with the plan and demonstrated an understanding of the instructions. The patient was advised to call back or seek an in-person evaluation if the symptoms worsen or if the condition fails to improve as anticipated.   I provided 21 minutes of non-face-to-face time during this encounter.   VRulon Eisenmenger MD 09/14/2018    I, Molly Dorshimer, am acting as scribe for VNicholas Lose MD.  I have reviewed the above documentation for accuracy and completeness, and I agree with the above.

## 2018-09-14 ENCOUNTER — Inpatient Hospital Stay (HOSPITAL_BASED_OUTPATIENT_CLINIC_OR_DEPARTMENT_OTHER): Payer: Medicare Other | Admitting: Hematology and Oncology

## 2018-09-14 DIAGNOSIS — D473 Essential (hemorrhagic) thrombocythemia: Secondary | ICD-10-CM

## 2018-09-14 MED ORDER — VARENICLINE TARTRATE 0.5 MG X 11 & 1 MG X 42 PO MISC: ORAL | 0 refills | Status: DC

## 2018-09-14 MED ORDER — ANAGRELIDE HCL 1 MG PO CAPS: 1.0000 mg | ORAL_CAPSULE | Freq: Every day | ORAL | 3 refills | Status: DC

## 2018-09-17 ENCOUNTER — Telehealth: Payer: Self-pay | Admitting: Hematology and Oncology

## 2018-09-17 NOTE — Telephone Encounter (Signed)
Called regarding schedule °

## 2018-09-26 ENCOUNTER — Telehealth: Payer: Self-pay | Admitting: *Deleted

## 2018-09-26 NOTE — Telephone Encounter (Signed)
Received call from pt stating she went to her pharmacy to pick up Agrylin 1mg  tablets.  Pt states pharmacy informed her that her insurance denied it and that she needed a prior auth filled out by Dr. Geralyn Flash office. RN informed pt that the information will be given to our team that works on medication prior [facility] and it could take up to 2 weeks to hear back.  Pt verbalized understanding.

## 2018-11-13 DIAGNOSIS — L219 Seborrheic dermatitis, unspecified: Secondary | ICD-10-CM | POA: Diagnosis not present

## 2018-11-13 DIAGNOSIS — K130 Diseases of lips: Secondary | ICD-10-CM | POA: Diagnosis not present

## 2018-11-14 ENCOUNTER — Other Ambulatory Visit: Payer: Medicare Other

## 2018-11-14 DIAGNOSIS — H57813 Brow ptosis, bilateral: Secondary | ICD-10-CM | POA: Diagnosis not present

## 2018-11-14 DIAGNOSIS — H02835 Dermatochalasis of left lower eyelid: Secondary | ICD-10-CM | POA: Diagnosis not present

## 2018-11-14 DIAGNOSIS — H02832 Dermatochalasis of right lower eyelid: Secondary | ICD-10-CM | POA: Diagnosis not present

## 2018-11-14 DIAGNOSIS — H02831 Dermatochalasis of right upper eyelid: Secondary | ICD-10-CM | POA: Diagnosis not present

## 2018-11-14 DIAGNOSIS — H53483 Generalized contraction of visual field, bilateral: Secondary | ICD-10-CM | POA: Diagnosis not present

## 2018-11-14 DIAGNOSIS — H0279 Other degenerative disorders of eyelid and periocular area: Secondary | ICD-10-CM | POA: Diagnosis not present

## 2018-11-14 DIAGNOSIS — H02413 Mechanical ptosis of bilateral eyelids: Secondary | ICD-10-CM | POA: Diagnosis not present

## 2018-11-14 DIAGNOSIS — H02834 Dermatochalasis of left upper eyelid: Secondary | ICD-10-CM | POA: Diagnosis not present

## 2018-11-14 DIAGNOSIS — H02423 Myogenic ptosis of bilateral eyelids: Secondary | ICD-10-CM | POA: Diagnosis not present

## 2018-11-16 ENCOUNTER — Ambulatory Visit: Payer: Medicare Other | Admitting: Hematology and Oncology

## 2018-11-27 ENCOUNTER — Other Ambulatory Visit: Payer: Self-pay | Admitting: Hematology and Oncology

## 2018-11-28 ENCOUNTER — Other Ambulatory Visit: Payer: Self-pay | Admitting: *Deleted

## 2018-11-28 MED ORDER — VARENICLINE TARTRATE 1 MG PO TABS: 1.0000 mg | ORAL_TABLET | Freq: Two times a day (BID) | ORAL | 0 refills | Status: DC

## 2018-11-28 NOTE — Progress Notes (Signed)
error 

## 2018-11-30 ENCOUNTER — Telehealth: Payer: Self-pay | Admitting: *Deleted

## 2018-12-13 NOTE — Assessment & Plan Note (Signed)
Hereditary hemachromatosis Genetic testing revealed homozygous C282Y mutation Current treatment: Previously underwent phlebotomy with the goal to keep ferritin less than 100 and saturation less than 50%, last phlebotomy August 2018 has not required since  Lab review: 03/24/2017:Ferritin10, iron saturation10% with a hemoglobin of 13.4 07/12/2017: Ferritin 18, iron saturation 66% with a hemoglobin of 14.8 09/18/2017: Ferritin 20, iron saturation 38%, hemoglobin 15.1 05/09/2018: Ferritin: 35, iron saturation 68%, hemoglobin 15.6, platelets 899 (increased from 650) 09/13/18: Ferritin 45  Treatment plan:Last phlebotomy was 12/16/2016  I discussed with the patient that she is still does not need any phlebotomy.

## 2018-12-17 ENCOUNTER — Other Ambulatory Visit: Payer: Self-pay

## 2018-12-17 ENCOUNTER — Telehealth: Payer: Self-pay | Admitting: Hematology and Oncology

## 2018-12-17 NOTE — Telephone Encounter (Signed)
Returned patient call re converting 9/3 follow up to phone visit. This is ok per VG. Left message for patient confirming 9/1 lab and 9/3 phone visit.

## 2018-12-18 ENCOUNTER — Inpatient Hospital Stay: Payer: Medicare Other | Attending: Hematology and Oncology

## 2018-12-18 ENCOUNTER — Other Ambulatory Visit: Payer: Self-pay | Admitting: Hematology and Oncology

## 2018-12-18 ENCOUNTER — Other Ambulatory Visit: Payer: Self-pay

## 2018-12-18 DIAGNOSIS — D473 Essential (hemorrhagic) thrombocythemia: Secondary | ICD-10-CM | POA: Diagnosis not present

## 2018-12-18 DIAGNOSIS — Z7982 Long term (current) use of aspirin: Secondary | ICD-10-CM | POA: Diagnosis not present

## 2018-12-18 LAB — CBC WITH DIFFERENTIAL (CANCER CENTER ONLY)
Abs Immature Granulocytes: 0.05 10*3/uL (ref 0.00–0.07)
Basophils Absolute: 0.1 10*3/uL (ref 0.0–0.1)
Basophils Relative: 0 %
Eosinophils Absolute: 0.3 10*3/uL (ref 0.0–0.5)
Eosinophils Relative: 2 %
HCT: 44.9 % (ref 36.0–46.0)
Hemoglobin: 15 g/dL (ref 12.0–15.0)
Immature Granulocytes: 0 %
Lymphocytes Relative: 22 %
Lymphs Abs: 2.4 10*3/uL (ref 0.7–4.0)
MCH: 30.1 pg (ref 26.0–34.0)
MCHC: 33.4 g/dL (ref 30.0–36.0)
MCV: 90.2 fL (ref 80.0–100.0)
Monocytes Absolute: 0.7 10*3/uL (ref 0.1–1.0)
Monocytes Relative: 7 %
Neutro Abs: 7.8 10*3/uL — ABNORMAL HIGH (ref 1.7–7.7)
Neutrophils Relative %: 69 %
Platelet Count: 993 10*3/uL (ref 150–400)
RBC: 4.98 MIL/uL (ref 3.87–5.11)
RDW: 14.9 % (ref 11.5–15.5)
WBC Count: 11.4 10*3/uL — ABNORMAL HIGH (ref 4.0–10.5)
nRBC: 0 % (ref 0.0–0.2)

## 2018-12-18 LAB — IRON AND TIBC
Iron: 157 ug/dL — ABNORMAL HIGH (ref 41–142)
Saturation Ratios: 67 % — ABNORMAL HIGH (ref 21–57)
TIBC: 234 ug/dL — ABNORMAL LOW (ref 236–444)
UIBC: 77 ug/dL — ABNORMAL LOW (ref 120–384)

## 2018-12-18 LAB — FERRITIN: Ferritin: 60 ng/mL (ref 11–307)

## 2018-12-19 ENCOUNTER — Telehealth: Payer: Self-pay | Admitting: Hematology and Oncology

## 2018-12-19 NOTE — Progress Notes (Signed)
  HEMATOLOGY-ONCOLOGY TELEPHONE VISIT PROGRESS NOTE  I connected with Kara [name] on 12/20/2018 at  9:15 AM EDT by telephone and verified that I am speaking with the correct person using two identifiers.  I discussed the limitations, risks, security and privacy concerns of performing an evaluation and management service by telephone and the availability of in person appointments.  I also discussed with the patient that there may be a patient responsible charge related to this service. The patient expressed understanding and agreed to proceed.   History of Present Illness: Kara [name] is a 68 y.o. female with above-mentioned history of thrombocytosis and hereditary hemochromatosis who is currently on 81mg  Aspirin. Labs on 12/18/18 showed: Hg 15.0, HCT 44.9, WBC 11.4, ANC 7.8, platelets 993, iron saturation 67%, ferritin 60. She presents over the phone today for follow-up to review her labs.  Her sister passed away suddenly and shes very distrought.  Observations/Objective:  Grieving.   Assessment Plan:  Essential thrombocytosis ([place]) Hereditary hemachromatosis Genetic testing revealed homozygous C282Y mutation Current treatment: Previously underwent phlebotomy with the goal to keep ferritin less than 100 and saturation less than 50%, last phlebotomy August 2018 has not required since  Lab review: 03/24/2017:Ferritin10, iron saturation10% with a hemoglobin of 13.4 07/12/2017: Ferritin 18, iron saturation 66% with a hemoglobin of 14.8 09/18/2017: Ferritin 20, iron saturation 38%, hemoglobin 15.1 05/09/2018: Ferritin: 35, iron saturation 68%, hemoglobin 15.6, platelets 899 (increased from 650) 09/13/18: Ferritin 45 12/18/2018: Ferritin 60, iron saturation 67%, hemoglobin 15, platelet count 993  Treatment plan:Last phlebotomy was 12/16/2016 Because of worsening thrombocytosis, I recommending changing to Hydrea  I discussed risks and benefits of Hydrea and she is willing to proceed. We will need to  recheck her labs monthly.  I discussed with the patient that she still does not need any phlebotomy.  I discussed the assessment and treatment plan with the patient. The patient was provided an opportunity to ask questions and all were answered. The patient agreed with the plan and demonstrated an understanding of the instructions. The patient was advised to call back or seek an in-person evaluation if the symptoms worsen or if the condition fails to improve as anticipated.   I provided 15 minutes of non-face-to-face time during this encounter.   Rulon Eisenmenger, MD 12/20/2018    I, Molly Dorshimer, am acting as scribe for Nicholas Lose, MD.  I have reviewed the above documentation for accuracy and completeness, and I agree with the above.

## 2018-12-20 ENCOUNTER — Inpatient Hospital Stay (HOSPITAL_BASED_OUTPATIENT_CLINIC_OR_DEPARTMENT_OTHER): Payer: Medicare Other | Admitting: Hematology and Oncology

## 2018-12-20 DIAGNOSIS — Z792 Long term (current) use of antibiotics: Secondary | ICD-10-CM

## 2018-12-20 DIAGNOSIS — D473 Essential (hemorrhagic) thrombocythemia: Secondary | ICD-10-CM

## 2018-12-20 MED ORDER — VARENICLINE TARTRATE 1 MG PO TABS: 1.0000 mg | ORAL_TABLET | Freq: Two times a day (BID) | ORAL | 1 refills | Status: DC

## 2018-12-20 MED ORDER — HYDROXYUREA 500 MG PO CAPS: 500.0000 mg | ORAL_CAPSULE | Freq: Every day | ORAL | 3 refills | Status: DC

## 2018-12-20 MED ORDER — ASPIRIN EC 81 MG PO TBEC: 81.0000 mg | DELAYED_RELEASE_TABLET | Freq: Every day | ORAL | Status: AC

## 2018-12-21 ENCOUNTER — Telehealth: Payer: Self-pay | Admitting: Hematology and Oncology

## 2018-12-21 NOTE — Telephone Encounter (Signed)
I talk with patient regarding schedule  

## 2019-01-16 NOTE — Progress Notes (Signed)
Patient Care Team: Patient, No Pcp Per as PCP - General (General Practice) Nicholas Lose, MD as Consulting Physician (Hematology and Oncology)  DIAGNOSIS:    ICD-10-CM   1. Hereditary hemochromatosis ([place])  E83.110   2. Essential thrombocytosis ([facility])  D47.3     CHIEF COMPLIANT: Follow-up of thrombocytosis and hereditary hemochromatosis  INTERVAL HISTORY: Kara [name] is a 68 y.o. with above-mentioned history of thrombocytosis and hereditary hemochromatosis who is currently on 81mg  Aspirin. She presents to the clinic today for follow-up to review her labs.   She has not had a phlebotomy in 2 years.  Surprisingly her hemochromatosis has been dormant.  REVIEW OF SYSTEMS:   Constitutional: Denies fevers, chills or abnormal weight loss Eyes: Denies blurriness of vision Ears, nose, mouth, throat, and face: Denies mucositis or sore throat Respiratory: Denies cough, dyspnea or wheezes Cardiovascular: Denies palpitation, chest discomfort Gastrointestinal: Denies nausea, heartburn or change in bowel habits Skin: Denies abnormal skin rashes Lymphatics: Denies new lymphadenopathy or easy bruising Neurological: Denies numbness, tingling or new weaknesses Behavioral/Psych: Mood is stable, no new changes  Extremities: No lower extremity edema   All other systems were reviewed with the patient and are negative.  I have reviewed the past medical history, past surgical history, social history and family history with the patient and they are unchanged from previous note.  ALLERGIES:  is allergic to penicillins.  MEDICATIONS:  Current Outpatient Medications  Medication Sig Dispense Refill  . aspirin EC 81 MG tablet Take 1 tablet (81 mg total) by mouth daily.    . hydroxyurea (HYDREA) 500 MG capsule Take 1 capsule (500 mg total) by mouth daily. May take with food to minimize GI side effects. 30 capsule 3  . varenicline (CHANTIX CONTINUING MONTH PAK) 1 MG tablet Take 1 tablet (1 mg total) by mouth  2 (two) times daily. 60 tablet 1   No current facility-administered medications for this visit.     PHYSICAL EXAMINATION: ECOG PERFORMANCE STATUS: 1 - Symptomatic but completely ambulatory  Vitals:   01/17/19 1031  BP: (!) 135/93  Pulse: 89  Resp: 18  Temp: 98.2 F (36.8 C)  SpO2: 99%   Filed Weights   01/17/19 1031  Weight: 98 lb 4.8 oz (44.6 kg)    GENERAL: alert, no distress and comfortable SKIN: skin color, texture, turgor are normal, no rashes or significant lesions EYES: normal, Conjunctiva are pink and non-injected, sclera clear OROPHARYNX: no exudate, no erythema and lips, buccal mucosa, and tongue normal  NECK: supple, thyroid normal size, non-tender, without nodularity LYMPH: no palpable lymphadenopathy in the cervical, axillary or inguinal LUNGS: clear to auscultation and percussion with normal breathing effort HEART: regular rate & rhythm and no murmurs and no lower extremity edema ABDOMEN: abdomen soft, non-tender and normal bowel sounds MUSCULOSKELETAL: no cyanosis of digits and no clubbing  NEURO: alert & oriented x 3 with fluent speech, no focal motor/sensory deficits EXTREMITIES: No lower extremity edema  LABORATORY DATA:  I have reviewed the data as listed CMP Latest Ref Rng & Units 04/28/2016 02/14/2013  Glucose 70 - 99 mg/dL 71 78  BUN 6 - 23 mg/dL 16 7  Creatinine 0.40 - 1.20 mg/dL 0.53 0.38(L)  Sodium 135 - 145 mEq/L 138 133(L)  Potassium 3.5 - 5.1 mEq/L 4.7 4.2  Chloride 96 - 112 mEq/L 108 95(L)  CO2 19 - 32 mEq/L 25 19  Calcium 8.4 - 10.5 mg/dL 9.7 9.9  Total Protein 6.0 - 8.3 g/dL 6.5 7.7  Total Bilirubin  0.2 - 1.2 mg/dL 0.3 0.6  Alkaline Phos 39 - 117 U/L 66 95  AST 0 - 37 U/L 18 117(H)  ALT 0 - 35 U/L 10 76(H)    Lab Results  Component Value Date   WBC 9.1 01/17/2019   HGB 15.8 (H) 01/17/2019   HCT 47.1 (H) 01/17/2019   MCV 93.3 01/17/2019   PLT 671 (H) 01/17/2019   NEUTROABS 6.2 01/17/2019    ASSESSMENT & PLAN:  Hereditary  hemochromatosis ([place]) Genetic testing revealed homozygous C282Y mutation Current treatment: Previously underwent phlebotomy with the goal to keep ferritin less than 100 and saturation less than 50%, last phlebotomy August 2018 has not required since  Lab review: 03/24/2017:Ferritin10, iron saturation10% with a hemoglobin of 13.4 07/12/2017: Ferritin 18, iron saturation 66% with a hemoglobin of 14.8 09/18/2017: Ferritin 20, iron saturation 38%, hemoglobin 15.1 05/09/2018: Ferritin: 35, iron saturation 68%, hemoglobin 15.6, platelets 899 (increased from 650) 09/13/18: Ferritin 45 12/18/2018: Ferritin 60, iron saturation 67%, hemoglobin 15, platelet count 993  Treatment plan:Last phlebotomy was 12/16/2016  Essential thrombocytosis ([facility]) Current treatment: Hydroxyurea 500 mg daily started 12/20/2018 (when platelet count went up to 993) along with aspirin Lab review: Platelet count 671 decreased from 993  Return to clinic in 3 months with labs and follow-up  No orders of the defined types were placed in this encounter.  The patient has a good understanding of the overall plan. she agrees with it. she will call with any problems that may develop before the next visit here.  Nicholas Lose, MD 01/17/2019  Julious Oka Dorshimer am acting as scribe for Dr. Nicholas Lose.  I have reviewed the above documentation for accuracy and completeness, and I agree with the above.

## 2019-01-17 ENCOUNTER — Telehealth: Payer: Self-pay | Admitting: Hematology and Oncology

## 2019-01-17 ENCOUNTER — Other Ambulatory Visit: Payer: Self-pay

## 2019-01-17 ENCOUNTER — Inpatient Hospital Stay: Payer: Medicare Other

## 2019-01-17 ENCOUNTER — Inpatient Hospital Stay: Payer: Medicare Other | Attending: Hematology and Oncology | Admitting: Hematology and Oncology

## 2019-01-17 DIAGNOSIS — Z7982 Long term (current) use of aspirin: Secondary | ICD-10-CM | POA: Diagnosis not present

## 2019-01-17 DIAGNOSIS — Z79899 Other long term (current) drug therapy: Secondary | ICD-10-CM | POA: Insufficient documentation

## 2019-01-17 DIAGNOSIS — D473 Essential (hemorrhagic) thrombocythemia: Secondary | ICD-10-CM

## 2019-01-17 LAB — CBC WITH DIFFERENTIAL (CANCER CENTER ONLY)
Abs Immature Granulocytes: 0.03 10*3/uL (ref 0.00–0.07)
Basophils Absolute: 0.1 10*3/uL (ref 0.0–0.1)
Basophils Relative: 1 %
Eosinophils Absolute: 0.2 10*3/uL (ref 0.0–0.5)
Eosinophils Relative: 2 %
HCT: 47.1 % — ABNORMAL HIGH (ref 36.0–46.0)
Hemoglobin: 15.8 g/dL — ABNORMAL HIGH (ref 12.0–15.0)
Immature Granulocytes: 0 %
Lymphocytes Relative: 23 %
Lymphs Abs: 2.1 10*3/uL (ref 0.7–4.0)
MCH: 31.3 pg (ref 26.0–34.0)
MCHC: 33.5 g/dL (ref 30.0–36.0)
MCV: 93.3 fL (ref 80.0–100.0)
Monocytes Absolute: 0.6 10*3/uL (ref 0.1–1.0)
Monocytes Relative: 7 %
Neutro Abs: 6.2 10*3/uL (ref 1.7–7.7)
Neutrophils Relative %: 67 %
Platelet Count: 671 10*3/uL — ABNORMAL HIGH (ref 150–400)
RBC: 5.05 MIL/uL (ref 3.87–5.11)
RDW: 17.6 % — ABNORMAL HIGH (ref 11.5–15.5)
WBC Count: 9.1 10*3/uL (ref 4.0–10.5)
nRBC: 0 % (ref 0.0–0.2)

## 2019-01-17 NOTE — Telephone Encounter (Signed)
I talk with patient regarding schedule  

## 2019-01-17 NOTE — Assessment & Plan Note (Addendum)
Current treatment: Hydroxyurea 500 mg daily started 12/20/2018 (when platelet count went up to 993) Lab review:

## 2019-01-17 NOTE — Assessment & Plan Note (Signed)
Genetic testing revealed homozygous C282Y mutation Current treatment: Previously underwent phlebotomy with the goal to keep ferritin less than 100 and saturation less than 50%, last phlebotomy August 2018 has not required since  Lab review: 03/24/2017:Ferritin10, iron saturation10% with a hemoglobin of 13.4 07/12/2017: Ferritin 18, iron saturation 66% with a hemoglobin of 14.8 09/18/2017: Ferritin 20, iron saturation 38%, hemoglobin 15.1 05/09/2018: Ferritin: 35, iron saturation 68%, hemoglobin 15.6, platelets 899 (increased from 650) 09/13/18: Ferritin 45 12/18/2018: Ferritin 60, iron saturation 67%, hemoglobin 15, platelet count 993  Treatment plan:Last phlebotomy was 12/16/2016

## 2019-01-18 ENCOUNTER — Telehealth: Payer: Self-pay | Admitting: Hematology and Oncology

## 2019-01-18 ENCOUNTER — Other Ambulatory Visit: Payer: Self-pay

## 2019-01-18 ENCOUNTER — Telehealth: Payer: Self-pay

## 2019-01-18 NOTE — Telephone Encounter (Signed)
Returned patient's phone call regarding test results, transferred patient to speak with providers nurse.

## 2019-01-18 NOTE — Telephone Encounter (Signed)
Pt called to request lab results from Ferritin, Iron and TIBC.    RN reviewed, only CBC was drawn on 01/17/2019.  Pt notified, will come to clinic next week to have additional labs drawn.  Pt will call to notify what day she is available.

## 2019-03-11 ENCOUNTER — Other Ambulatory Visit: Payer: Self-pay | Admitting: *Deleted

## 2019-04-22 NOTE — Progress Notes (Signed)
Patient Care Team: Patient, No Pcp Per as PCP - General (General Practice) Nicholas Lose, MD as Consulting Physician (Hematology and Oncology)  DIAGNOSIS:    ICD-10-CM   1. Essential thrombocytosis ([facility])  D47.3   2. Hereditary hemochromatosis ([place])  E83.110     CHIEF COMPLIANT: Follow-up of thrombocytosis and hereditary hemochromatosis  INTERVAL HISTORY: Kara [name] is a 69 y.o. with above-mentioned history of thrombocytosis and hereditary hemochromatosis who is currently on 81mg  Aspirin.She presentsto the clinic today for follow-up to review her labs.  Patient's sister died in 01-20-2023 and they are still grieving her loss. She is experiencing a few side effects to hydroxyurea.  These include hair thinning, dryness of the skin, brittle nails as well as excessive saliva in the mouth  I have reviewed the past medical history, past surgical history, social history and family history with the patient and they are unchanged from previous note.  ALLERGIES:  is allergic to penicillins.  MEDICATIONS:  Current Outpatient Medications  Medication Sig Dispense Refill  . aspirin EC 81 MG tablet Take 1 tablet (81 mg total) by mouth daily.    . hydroxyurea (HYDREA) 500 MG capsule Take 1 capsule (500 mg total) by mouth daily. May take with food to minimize GI side effects. 30 capsule 3  . varenicline (CHANTIX CONTINUING MONTH PAK) 1 MG tablet Take 1 tablet (1 mg total) by mouth 2 (two) times daily. 60 tablet 1   No current facility-administered medications for this visit.    PHYSICAL EXAMINATION: ECOG PERFORMANCE STATUS: 1 - Symptomatic but completely ambulatory  Vitals:   04/23/19 0840  BP: 128/87  Pulse: 69  Resp: 16  Temp: (!) 97.3 F (36.3 C)  SpO2: 99%   Filed Weights   04/23/19 0840  Weight: 103 lb 11.2 oz (47 kg)    LABORATORY DATA:  I have reviewed the data as listed CMP Latest Ref Rng & Units 04/28/2016 02/14/2013  Glucose 70 - 99 mg/dL 71 78  BUN 6 - 23 mg/dL 16 7    Creatinine 0.40 - 1.20 mg/dL 0.53 0.38(L)  Sodium 135 - 145 mEq/L 138 133(L)  Potassium 3.5 - 5.1 mEq/L 4.7 4.2  Chloride 96 - 112 mEq/L 108 95(L)  CO2 19 - 32 mEq/L 25 19  Calcium 8.4 - 10.5 mg/dL 9.7 9.9  Total Protein 6.0 - 8.3 g/dL 6.5 7.7  Total Bilirubin 0.2 - 1.2 mg/dL 0.3 0.6  Alkaline Phos 39 - 117 U/L 66 95  AST 0 - 37 U/L 18 117(H)  ALT 0 - 35 U/L 10 76(H)    Lab Results  Component Value Date   WBC 8.1 04/23/2019   HGB 13.1 04/23/2019   HCT 39.5 04/23/2019   MCV 101.8 (H) 04/23/2019   PLT 460 (H) 04/23/2019   NEUTROABS 5.2 04/23/2019    ASSESSMENT & PLAN:  Hereditary hemochromatosis ([place]) Genetic testing revealed homozygous C282Y mutation Current treatment:Previously underwent phlebotomy with the goal to keep ferritin less than 100 and saturation less than 50%, last phlebotomy August 2018 has not required since  Lab review: 03/24/2017:Ferritin10, iron saturation10% with a hemoglobin of 13.4 07/12/2017: Ferritin 18, iron saturation 66% with a hemoglobin of 14.8 09/18/2017: Ferritin 20, iron saturation 38%, hemoglobin 15.1 05/09/2018: Ferritin: 35, iron saturation 68%, hemoglobin 15.6, platelets 899 (increased from 650) 09/13/18: Ferritin 45 12/18/2018: Ferritin 60, iron saturation 67%, hemoglobin 15, platelet count 993  Treatment plan:Last phlebotomy was 12/16/2016  Essential thrombocytosis ([facility]) Current treatment: Hydroxyurea 500 mg daily started 12/20/2018 (when platelet  count went up to 993) along with aspirin Lab review: Platelet count 460 (previously it was 671 decreased from 993) Toxicities of hydroxyurea: 1.  Hair thinning 2.  Dry skin 3.  Excessive saliva Will decrease hydroxyurea to 4 days a week  Return to clinic in 3 months with labs and follow-up    No orders of the defined types were placed in this encounter.  The patient has a good understanding of the overall plan. she agrees with it. she will call with any problems that may develop before  the next visit here.  Total time spent: 30 mins including face to face time and time spent for planning, charting and coordination of care  Nicholas Lose, MD 04/23/2019  I, Cloyde Reams Dorshimer, am acting as scribe for Dr. Nicholas Lose.  I have reviewed the above document for accuracy and completeness, and I agree with the above.

## 2019-04-23 ENCOUNTER — Inpatient Hospital Stay: Payer: Medicare Other

## 2019-04-23 ENCOUNTER — Other Ambulatory Visit: Payer: Self-pay

## 2019-04-23 ENCOUNTER — Inpatient Hospital Stay: Payer: Medicare Other | Attending: Hematology and Oncology | Admitting: Hematology and Oncology

## 2019-04-23 ENCOUNTER — Other Ambulatory Visit: Payer: Self-pay | Admitting: Hematology and Oncology

## 2019-04-23 VITALS — BP 128/87 | HR 69 | Temp 97.3°F | Resp 16 | Ht 60.0 in | Wt 103.7 lb

## 2019-04-23 DIAGNOSIS — D473 Essential (hemorrhagic) thrombocythemia: Secondary | ICD-10-CM | POA: Insufficient documentation

## 2019-04-23 DIAGNOSIS — Z79899 Other long term (current) drug therapy: Secondary | ICD-10-CM | POA: Insufficient documentation

## 2019-04-23 DIAGNOSIS — Z7982 Long term (current) use of aspirin: Secondary | ICD-10-CM | POA: Insufficient documentation

## 2019-04-23 LAB — CBC WITH DIFFERENTIAL (CANCER CENTER ONLY)
Abs Immature Granulocytes: 0.03 10*3/uL (ref 0.00–0.07)
Basophils Absolute: 0 10*3/uL (ref 0.0–0.1)
Basophils Relative: 0 %
Eosinophils Absolute: 0.2 10*3/uL (ref 0.0–0.5)
Eosinophils Relative: 3 %
HCT: 39.5 % (ref 36.0–46.0)
Hemoglobin: 13.1 g/dL (ref 12.0–15.0)
Immature Granulocytes: 0 %
Lymphocytes Relative: 25 %
Lymphs Abs: 2 10*3/uL (ref 0.7–4.0)
MCH: 33.8 pg (ref 26.0–34.0)
MCHC: 33.2 g/dL (ref 30.0–36.0)
MCV: 101.8 fL — ABNORMAL HIGH (ref 80.0–100.0)
Monocytes Absolute: 0.6 10*3/uL (ref 0.1–1.0)
Monocytes Relative: 8 %
Neutro Abs: 5.2 10*3/uL (ref 1.7–7.7)
Neutrophils Relative %: 64 %
Platelet Count: 460 10*3/uL — ABNORMAL HIGH (ref 150–400)
RBC: 3.88 MIL/uL (ref 3.87–5.11)
RDW: 16.3 % — ABNORMAL HIGH (ref 11.5–15.5)
WBC Count: 8.1 10*3/uL (ref 4.0–10.5)
nRBC: 0 % (ref 0.0–0.2)

## 2019-04-23 LAB — IRON AND TIBC
Iron: 201 ug/dL — ABNORMAL HIGH (ref 41–142)
Saturation Ratios: 93 % — ABNORMAL HIGH (ref 21–57)
TIBC: 218 ug/dL — ABNORMAL LOW (ref 236–444)
UIBC: 16 ug/dL — ABNORMAL LOW (ref 120–384)

## 2019-04-23 LAB — FERRITIN: Ferritin: 76 ng/mL (ref 11–307)

## 2019-04-23 NOTE — Progress Notes (Signed)
I reviewed the blood work with the patient.  Her ferritin is normal but the iron saturation is 93%. She may have taken a multivitamin before getting this blood work done. Therefore we will recheck it next week.  I will call her with that result.

## 2019-04-23 NOTE — Assessment & Plan Note (Signed)
Genetic testing revealed homozygous C282Y mutation Current treatment:Previously underwent phlebotomy with the goal to keep ferritin less than 100 and saturation less than 50%, last phlebotomy August 2018 has not required since  Lab review: 03/24/2017:Ferritin10, iron saturation10% with a hemoglobin of 13.4 07/12/2017: Ferritin 18, iron saturation 66% with a hemoglobin of 14.8 09/18/2017: Ferritin 20, iron saturation 38%, hemoglobin 15.1 05/09/2018: Ferritin: 35, iron saturation 68%, hemoglobin 15.6, platelets 899 (increased from 650) 09/13/18: Ferritin 45 12/18/2018: Ferritin 60, iron saturation 67%, hemoglobin 15, platelet count 993  Treatment plan:Last phlebotomy was 12/16/2016  Essential thrombocytosis ([facility]) Current treatment: Hydroxyurea 500 mg daily started 12/20/2018 (when platelet count went up to 993) along with aspirin Lab review: Platelet count 671 decreased from 993  Return to clinic in 3 months with labs and follow-up

## 2019-04-24 ENCOUNTER — Telehealth: Payer: Self-pay | Admitting: Hematology and Oncology

## 2019-04-24 NOTE — Telephone Encounter (Signed)
I talk with patient regarding schedule  

## 2019-04-30 ENCOUNTER — Telehealth: Payer: Self-pay | Admitting: Hematology and Oncology

## 2019-04-30 NOTE — Telephone Encounter (Signed)
Returned patient's phone call regarding rescheduling 01/13 appointment to 01/14, per patient's request appointments have been rescheduled.

## 2019-05-01 ENCOUNTER — Inpatient Hospital Stay: Payer: Medicare Other

## 2019-05-02 ENCOUNTER — Other Ambulatory Visit: Payer: Self-pay | Admitting: Hematology and Oncology

## 2019-05-02 ENCOUNTER — Other Ambulatory Visit: Payer: Self-pay

## 2019-05-02 ENCOUNTER — Inpatient Hospital Stay: Payer: Medicare Other

## 2019-05-02 DIAGNOSIS — D473 Essential (hemorrhagic) thrombocythemia: Secondary | ICD-10-CM | POA: Diagnosis not present

## 2019-05-02 DIAGNOSIS — Z7982 Long term (current) use of aspirin: Secondary | ICD-10-CM | POA: Diagnosis not present

## 2019-05-02 DIAGNOSIS — Z79899 Other long term (current) drug therapy: Secondary | ICD-10-CM | POA: Diagnosis not present

## 2019-05-02 LAB — CBC WITH DIFFERENTIAL (CANCER CENTER ONLY)
Abs Immature Granulocytes: 0.01 10*3/uL (ref 0.00–0.07)
Basophils Absolute: 0 10*3/uL (ref 0.0–0.1)
Basophils Relative: 0 %
Eosinophils Absolute: 0.2 10*3/uL (ref 0.0–0.5)
Eosinophils Relative: 2 %
HCT: 40.5 % (ref 36.0–46.0)
Hemoglobin: 13.6 g/dL (ref 12.0–15.0)
Immature Granulocytes: 0 %
Lymphocytes Relative: 30 %
Lymphs Abs: 2.5 10*3/uL (ref 0.7–4.0)
MCH: 34.3 pg — ABNORMAL HIGH (ref 26.0–34.0)
MCHC: 33.6 g/dL (ref 30.0–36.0)
MCV: 102.3 fL — ABNORMAL HIGH (ref 80.0–100.0)
Monocytes Absolute: 0.7 10*3/uL (ref 0.1–1.0)
Monocytes Relative: 8 %
Neutro Abs: 4.9 10*3/uL (ref 1.7–7.7)
Neutrophils Relative %: 60 %
Platelet Count: 521 10*3/uL — ABNORMAL HIGH (ref 150–400)
RBC: 3.96 MIL/uL (ref 3.87–5.11)
RDW: 15.4 % (ref 11.5–15.5)
WBC Count: 8.3 10*3/uL (ref 4.0–10.5)
nRBC: 0 % (ref 0.0–0.2)

## 2019-05-02 LAB — FERRITIN: Ferritin: 85 ng/mL (ref 11–307)

## 2019-05-02 LAB — IRON AND TIBC
Iron: 242 ug/dL — ABNORMAL HIGH (ref 41–142)
Saturation Ratios: 105 % — ABNORMAL HIGH (ref 21–57)
TIBC: 230 ug/dL — ABNORMAL LOW (ref 236–444)
UIBC: UNDETERMINED ug/dL (ref 120–384)

## 2019-05-02 NOTE — Progress Notes (Signed)
I reviewed the lab work with the patient which showed 105% iron saturation. I recommended starting phlebotomies once a month for 3 months and then I will see her with the fourth month labs and follow-up.

## 2019-05-10 ENCOUNTER — Telehealth: Payer: Self-pay | Admitting: Hematology and Oncology

## 2019-05-10 NOTE — Telephone Encounter (Signed)
Returned patient's phone call regarding rescheduling 01/26 appointment, per patient's request appointment has moved to 01/27.

## 2019-05-15 ENCOUNTER — Inpatient Hospital Stay: Payer: Medicare Other

## 2019-05-15 ENCOUNTER — Telehealth: Payer: Self-pay

## 2019-05-15 NOTE — Telephone Encounter (Signed)
Spoke with patient via telephone. States she has been unable to secure a ride to the [facility] for this am's appt. Would like to be rescheduled for 05/17/2019. Also requesting to have labs repeated between 1st and 2nd phlebotomies. Message sent to Heinz Knuckles, RN.

## 2019-05-17 ENCOUNTER — Other Ambulatory Visit: Payer: Self-pay

## 2019-05-17 ENCOUNTER — Inpatient Hospital Stay: Payer: Medicare Other

## 2019-05-17 ENCOUNTER — Encounter: Payer: Self-pay | Admitting: *Deleted

## 2019-05-17 DIAGNOSIS — Z7982 Long term (current) use of aspirin: Secondary | ICD-10-CM | POA: Diagnosis not present

## 2019-05-17 DIAGNOSIS — D473 Essential (hemorrhagic) thrombocythemia: Secondary | ICD-10-CM | POA: Diagnosis not present

## 2019-05-17 DIAGNOSIS — Z79899 Other long term (current) drug therapy: Secondary | ICD-10-CM | POA: Diagnosis not present

## 2019-05-17 NOTE — Patient Instructions (Signed)
Therapeutic Phlebotomy Therapeutic phlebotomy is the planned removal of blood from a person's body for the purpose of treating a medical condition. The procedure is similar to donating blood. Usually, about a pint (470 mL, or 0.47 L) of blood is removed. The average adult has 9-12 pints (4.3-5.7 L) of blood in the body. Therapeutic phlebotomy may be used to treat the following medical conditions:  Hemochromatosis. This is a condition in which the blood contains too much iron.  Polycythemia vera. This is a condition in which the blood contains too many red blood cells.  Porphyria cutanea tarda. This is a disease in which an important part of hemoglobin is not made properly. It results in the buildup of abnormal amounts of porphyrins in the body.  Sickle cell disease. This is a condition in which the red blood cells form an abnormal crescent shape rather than a round shape. Tell a health care provider about:  Any allergies you have.  All medicines you are taking, including vitamins, herbs, eye drops, creams, and over-the-counter medicines.  Any problems you or family members have had with anesthetic medicines.  Any blood disorders you have.  Any surgeries you have had.  Any medical conditions you have.  Whether you are pregnant or may be pregnant. What are the risks? Generally, this is a safe procedure. However, problems may occur, including:  Nausea or light-headedness.  Low blood pressure (hypotension).  Soreness, bleeding, swelling, or bruising at the needle insertion site.  Infection. What happens before the procedure?  Follow instructions from your health care provider about eating or drinking restrictions.  Ask your health care provider about: ? Changing or stopping your regular medicines. This is especially important if you are taking diabetes medicines or blood thinners (anticoagulants). ? Taking medicines such as aspirin and ibuprofen. These medicines can thin your  blood. Do not take these medicines unless your health care provider tells you to take them. ? Taking over-the-counter medicines, vitamins, herbs, and supplements.  Wear clothing with sleeves that can be raised above the elbow.  Plan to have someone take you home from the hospital or clinic.  You may have a blood sample taken.  Your blood pressure, pulse rate, and breathing rate will be measured. What happens during the procedure?   To lower your risk of infection: ? Your health care team will wash or sanitize their hands. ? Your skin will be cleaned with an antiseptic.  You may be given a medicine to numb the area (local anesthetic).  A tourniquet will be placed on your arm.  A needle will be inserted into one of your veins.  Tubing and a collection bag will be attached to that needle.  Blood will flow through the needle and tubing into the collection bag.  The collection bag will be placed lower than your arm to allow gravity to help the flow of blood into the bag.  You may be asked to open and close your hand slowly and continually during the entire collection.  After the specified amount of blood has been removed from your body, the collection bag and tubing will be clamped.  The needle will be removed from your vein.  Pressure will be held on the site of the needle insertion to stop the bleeding.  A bandage (dressing) will be placed over the needle insertion site. The procedure may vary among health care providers and hospitals. What happens after the procedure?  Your blood pressure, pulse rate, and breathing rate will be   measured after the procedure.  You will be encouraged to drink fluids.  Your recovery will be assessed and monitored.  You can return to your normal activities as told by your health care provider. Summary  Therapeutic phlebotomy is the planned removal of blood from a person's body for the purpose of treating a medical condition.  Therapeutic  phlebotomy may be used to treat hemochromatosis, polycythemia vera, porphyria cutanea tarda, or sickle cell disease.  In the procedure, a needle is inserted and about a pint (470 mL, or 0.47 L) of blood is removed. The average adult has 9-12 pints (4.3-5.7 L) of blood in the body.  This is generally a safe procedure, but it can sometimes cause problems such as nausea, light-headedness, or low blood pressure (hypotension). This information is not intended to replace advice given to you by your health care provider. Make sure you discuss any questions you have with your health care provider. Document Revised: 04/20/2017 Document Reviewed: 04/20/2017 Elsevier Patient Education  2020 [employer]  

## 2019-05-17 NOTE — Progress Notes (Signed)
Kara [name] presents today for phlebotomy per MD orders from note on 05/02/19 per [place]. Phlebotomy procedure started at 0800 and ended at 0815 with 500 grams removed via 20 G to [place]. IV Catheter removed and intact.  Patient observed for 30 minutes after procedure without any incident. Patient tolerated procedure well. Diet and nutrition offered.

## 2019-05-17 NOTE — Progress Notes (Signed)
Pt in the infusion area and requesting to have phlebotomies drawn every two weeks instead of once a month due to symptoms pt is experiencing and saturations being high.  Per MD okay to schedule pt every two weeks for phlebotomy with lab work drawn the day prior.  Scheduling message sent.

## 2019-05-21 ENCOUNTER — Telehealth: Payer: Self-pay | Admitting: Hematology and Oncology

## 2019-05-21 NOTE — Telephone Encounter (Signed)
I talk with patient regarding schedule  

## 2019-05-31 ENCOUNTER — Other Ambulatory Visit: Payer: Self-pay

## 2019-05-31 ENCOUNTER — Other Ambulatory Visit: Payer: Self-pay | Admitting: *Deleted

## 2019-05-31 ENCOUNTER — Inpatient Hospital Stay: Payer: Medicare Other | Attending: Hematology and Oncology

## 2019-05-31 DIAGNOSIS — D473 Essential (hemorrhagic) thrombocythemia: Secondary | ICD-10-CM | POA: Diagnosis not present

## 2019-05-31 LAB — CBC WITH DIFFERENTIAL (CANCER CENTER ONLY)
Abs Immature Granulocytes: 0.02 10*3/uL (ref 0.00–0.07)
Basophils Absolute: 0 10*3/uL (ref 0.0–0.1)
Basophils Relative: 0 %
Eosinophils Absolute: 0.2 10*3/uL (ref 0.0–0.5)
Eosinophils Relative: 2 %
HCT: 36.7 % (ref 36.0–46.0)
Hemoglobin: 12 g/dL (ref 12.0–15.0)
Immature Granulocytes: 0 %
Lymphocytes Relative: 25 %
Lymphs Abs: 1.8 10*3/uL (ref 0.7–4.0)
MCH: 34.5 pg — ABNORMAL HIGH (ref 26.0–34.0)
MCHC: 32.7 g/dL (ref 30.0–36.0)
MCV: 105.5 fL — ABNORMAL HIGH (ref 80.0–100.0)
Monocytes Absolute: 0.4 10*3/uL (ref 0.1–1.0)
Monocytes Relative: 6 %
Neutro Abs: 4.8 10*3/uL (ref 1.7–7.7)
Neutrophils Relative %: 67 %
Platelet Count: 500 10*3/uL — ABNORMAL HIGH (ref 150–400)
RBC: 3.48 MIL/uL — ABNORMAL LOW (ref 3.87–5.11)
RDW: 14.6 % (ref 11.5–15.5)
WBC Count: 7.2 10*3/uL (ref 4.0–10.5)
nRBC: 0 % (ref 0.0–0.2)

## 2019-05-31 LAB — IRON AND TIBC
Iron: 177 ug/dL — ABNORMAL HIGH (ref 41–142)
Saturation Ratios: 76 % — ABNORMAL HIGH (ref 21–57)
TIBC: 234 ug/dL — ABNORMAL LOW (ref 236–444)
UIBC: 56 ug/dL — ABNORMAL LOW (ref 120–384)

## 2019-05-31 LAB — FERRITIN: Ferritin: 60 ng/mL (ref 11–307)

## 2019-06-03 ENCOUNTER — Inpatient Hospital Stay: Payer: Medicare Other

## 2019-06-03 ENCOUNTER — Other Ambulatory Visit: Payer: Self-pay

## 2019-06-03 DIAGNOSIS — D473 Essential (hemorrhagic) thrombocythemia: Secondary | ICD-10-CM | POA: Diagnosis not present

## 2019-06-03 NOTE — Patient Instructions (Signed)

## 2019-06-03 NOTE — Progress Notes (Signed)
Phlebotomy procedure started at 0748 and ended at 0755 with 506 grams removed via 16 G to [place]. IV Catheter removed and intact. Patient tolerated well, beverages and snacks provided.   Patient observed for 30 min post observation, VSS.  Discharged with no distress.

## 2019-06-09 ENCOUNTER — Other Ambulatory Visit: Payer: Self-pay | Admitting: Hematology and Oncology

## 2019-06-12 ENCOUNTER — Other Ambulatory Visit: Payer: Self-pay | Admitting: *Deleted

## 2019-06-12 DIAGNOSIS — D75839 Thrombocytosis, unspecified: Secondary | ICD-10-CM

## 2019-06-12 DIAGNOSIS — D473 Essential (hemorrhagic) thrombocythemia: Secondary | ICD-10-CM

## 2019-06-13 ENCOUNTER — Inpatient Hospital Stay: Payer: Medicare Other

## 2019-06-13 ENCOUNTER — Other Ambulatory Visit: Payer: Self-pay

## 2019-06-13 DIAGNOSIS — D473 Essential (hemorrhagic) thrombocythemia: Secondary | ICD-10-CM

## 2019-06-13 DIAGNOSIS — D75839 Thrombocytosis, unspecified: Secondary | ICD-10-CM

## 2019-06-13 LAB — IRON AND TIBC
Iron: 181 ug/dL — ABNORMAL HIGH (ref 41–142)
Saturation Ratios: 76 % — ABNORMAL HIGH (ref 21–57)
TIBC: 239 ug/dL (ref 236–444)
UIBC: 58 ug/dL — ABNORMAL LOW (ref 120–384)

## 2019-06-13 LAB — CBC WITH DIFFERENTIAL (CANCER CENTER ONLY)
Abs Immature Granulocytes: 0.02 10*3/uL (ref 0.00–0.07)
Basophils Absolute: 0 10*3/uL (ref 0.0–0.1)
Basophils Relative: 1 %
Eosinophils Absolute: 0.2 10*3/uL (ref 0.0–0.5)
Eosinophils Relative: 3 %
HCT: 38.9 % (ref 36.0–46.0)
Hemoglobin: 12.6 g/dL (ref 12.0–15.0)
Immature Granulocytes: 0 %
Lymphocytes Relative: 33 %
Lymphs Abs: 2.4 10*3/uL (ref 0.7–4.0)
MCH: 34.8 pg — ABNORMAL HIGH (ref 26.0–34.0)
MCHC: 32.4 g/dL (ref 30.0–36.0)
MCV: 107.5 fL — ABNORMAL HIGH (ref 80.0–100.0)
Monocytes Absolute: 0.6 10*3/uL (ref 0.1–1.0)
Monocytes Relative: 8 %
Neutro Abs: 4 10*3/uL (ref 1.7–7.7)
Neutrophils Relative %: 55 %
Platelet Count: 546 10*3/uL — ABNORMAL HIGH (ref 150–400)
RBC: 3.62 MIL/uL — ABNORMAL LOW (ref 3.87–5.11)
RDW: 14.4 % (ref 11.5–15.5)
WBC Count: 7.3 10*3/uL (ref 4.0–10.5)
nRBC: 0 % (ref 0.0–0.2)

## 2019-06-13 LAB — FERRITIN: Ferritin: 39 ng/mL (ref 11–307)

## 2019-06-14 ENCOUNTER — Inpatient Hospital Stay: Payer: Medicare Other

## 2019-06-14 ENCOUNTER — Other Ambulatory Visit: Payer: Self-pay

## 2019-06-14 NOTE — Patient Instructions (Signed)

## 2019-06-14 NOTE — Progress Notes (Signed)
Left AC phlebotomy started at 7:55 and ended at 8:01. Using phleb kit. Patient tolerated well. Provided water and coffee. Patient VSS and DC'd after 30 min post DC.

## 2019-06-27 ENCOUNTER — Other Ambulatory Visit: Payer: Self-pay | Admitting: *Deleted

## 2019-06-27 DIAGNOSIS — D75839 Thrombocytosis, unspecified: Secondary | ICD-10-CM

## 2019-06-27 DIAGNOSIS — D473 Essential (hemorrhagic) thrombocythemia: Secondary | ICD-10-CM

## 2019-06-28 ENCOUNTER — Inpatient Hospital Stay: Payer: Medicare Other | Attending: Hematology and Oncology

## 2019-06-28 ENCOUNTER — Other Ambulatory Visit: Payer: Self-pay

## 2019-06-28 DIAGNOSIS — D75839 Thrombocytosis, unspecified: Secondary | ICD-10-CM

## 2019-06-28 DIAGNOSIS — D473 Essential (hemorrhagic) thrombocythemia: Secondary | ICD-10-CM

## 2019-06-28 LAB — CBC WITH DIFFERENTIAL (CANCER CENTER ONLY)
Abs Immature Granulocytes: 0.03 10*3/uL (ref 0.00–0.07)
Basophils Absolute: 0 10*3/uL (ref 0.0–0.1)
Basophils Relative: 0 %
Eosinophils Absolute: 0.2 10*3/uL (ref 0.0–0.5)
Eosinophils Relative: 2 %
HCT: 39.7 % (ref 36.0–46.0)
Hemoglobin: 12.8 g/dL (ref 12.0–15.0)
Immature Granulocytes: 0 %
Lymphocytes Relative: 28 %
Lymphs Abs: 2.1 10*3/uL (ref 0.7–4.0)
MCH: 34.1 pg — ABNORMAL HIGH (ref 26.0–34.0)
MCHC: 32.2 g/dL (ref 30.0–36.0)
MCV: 105.9 fL — ABNORMAL HIGH (ref 80.0–100.0)
Monocytes Absolute: 0.5 10*3/uL (ref 0.1–1.0)
Monocytes Relative: 7 %
Neutro Abs: 4.6 10*3/uL (ref 1.7–7.7)
Neutrophils Relative %: 63 %
Platelet Count: 551 10*3/uL — ABNORMAL HIGH (ref 150–400)
RBC: 3.75 MIL/uL — ABNORMAL LOW (ref 3.87–5.11)
RDW: 14 % (ref 11.5–15.5)
WBC Count: 7.4 10*3/uL (ref 4.0–10.5)
nRBC: 0 % (ref 0.0–0.2)

## 2019-06-28 LAB — IRON AND TIBC
Iron: 115 ug/dL (ref 41–142)
Saturation Ratios: 43 % (ref 21–57)
TIBC: 268 ug/dL (ref 236–444)
UIBC: 152 ug/dL (ref 120–384)

## 2019-06-28 LAB — FERRITIN: Ferritin: 26 ng/mL (ref 11–307)

## 2019-07-01 ENCOUNTER — Other Ambulatory Visit: Payer: Self-pay

## 2019-07-01 ENCOUNTER — Inpatient Hospital Stay: Payer: Medicare Other

## 2019-07-01 NOTE — Patient Instructions (Signed)

## 2019-07-01 NOTE — Progress Notes (Signed)
Therapeutic phlebotomy performed via left AC over approximately 10 minutes.  500grm removed.  Pt tolerated well.  Drink/snack provided post procedure.  Pt observed 30 minutes post procedure.  VSS.

## 2019-07-10 ENCOUNTER — Other Ambulatory Visit: Payer: Self-pay

## 2019-07-11 ENCOUNTER — Inpatient Hospital Stay: Payer: Medicare Other

## 2019-07-12 ENCOUNTER — Inpatient Hospital Stay: Payer: Medicare Other

## 2019-07-22 ENCOUNTER — Other Ambulatory Visit: Payer: Medicare Other

## 2019-07-22 ENCOUNTER — Ambulatory Visit: Payer: Medicare Other | Admitting: Hematology and Oncology

## 2019-08-12 ENCOUNTER — Other Ambulatory Visit: Payer: Self-pay

## 2019-08-12 ENCOUNTER — Inpatient Hospital Stay: Payer: Medicare Other | Attending: Hematology and Oncology

## 2019-08-12 DIAGNOSIS — Z7982 Long term (current) use of aspirin: Secondary | ICD-10-CM | POA: Insufficient documentation

## 2019-08-12 DIAGNOSIS — Z79899 Other long term (current) drug therapy: Secondary | ICD-10-CM | POA: Insufficient documentation

## 2019-08-12 LAB — CBC WITH DIFFERENTIAL (CANCER CENTER ONLY)
Abs Immature Granulocytes: 0.04 10*3/uL (ref 0.00–0.07)
Basophils Absolute: 0 10*3/uL (ref 0.0–0.1)
Basophils Relative: 0 %
Eosinophils Absolute: 0.2 10*3/uL (ref 0.0–0.5)
Eosinophils Relative: 2 %
HCT: 43.1 % (ref 36.0–46.0)
Hemoglobin: 14.1 g/dL (ref 12.0–15.0)
Immature Granulocytes: 0 %
Lymphocytes Relative: 13 %
Lymphs Abs: 1.6 10*3/uL (ref 0.7–4.0)
MCH: 33.9 pg (ref 26.0–34.0)
MCHC: 32.7 g/dL (ref 30.0–36.0)
MCV: 103.6 fL — ABNORMAL HIGH (ref 80.0–100.0)
Monocytes Absolute: 0.8 10*3/uL (ref 0.1–1.0)
Monocytes Relative: 7 %
Neutro Abs: 9.2 10*3/uL — ABNORMAL HIGH (ref 1.7–7.7)
Neutrophils Relative %: 78 %
Platelet Count: 484 10*3/uL — ABNORMAL HIGH (ref 150–400)
RBC: 4.16 MIL/uL (ref 3.87–5.11)
RDW: 13.3 % (ref 11.5–15.5)
WBC Count: 11.9 10*3/uL — ABNORMAL HIGH (ref 4.0–10.5)
nRBC: 0 % (ref 0.0–0.2)

## 2019-08-12 LAB — IRON AND TIBC
Iron: 128 ug/dL (ref 41–142)
Saturation Ratios: 56 % (ref 21–57)
TIBC: 228 ug/dL — ABNORMAL LOW (ref 236–444)
UIBC: 100 ug/dL — ABNORMAL LOW (ref 120–384)

## 2019-08-12 LAB — FERRITIN: Ferritin: 21 ng/mL (ref 11–307)

## 2019-08-12 NOTE — Progress Notes (Signed)
Patient Care Team: Patient, No Pcp Per as PCP - General (General Practice) Nicholas Lose, MD as Consulting Physician (Hematology and Oncology)  DIAGNOSIS:    ICD-10-CM   1. Hereditary hemochromatosis ([place])  E83.110     CHIEF COMPLIANT: Follow-up ofthrombocytosis and hereditary hemochromatosis  INTERVAL HISTORY: Kara [name] is a 69 y.o. with above-mentioned history of thrombocytosis and hereditary hemochromatosis who is currently on 81mg  Aspirin and has received phlebotomies every 2 weeks for the last 3 months. Labs on 08/12/19 showed: WBC 11.9, ANC 9.2, Hg 14.1, MCV 103.6, platelets 484, iron saturation 56%, ferritin 21. She presentsto the clinictoday for follow-up to review her labs.  ALLERGIES:  is allergic to penicillins.  MEDICATIONS:  Current Outpatient Medications  Medication Sig Dispense Refill  . aspirin EC 81 MG tablet Take 1 tablet (81 mg total) by mouth daily.    . hydroxyurea (HYDREA) 500 MG capsule TAKE 1 CAPSULE(500 MG) BY MOUTH DAILY. MAY TAKE WITH FOOD TO MINIMIZE GI SIDE EFFECTS 30 capsule 3  . varenicline (CHANTIX CONTINUING MONTH PAK) 1 MG tablet Take 1 tablet (1 mg total) by mouth 2 (two) times daily. 60 tablet 1   No current facility-administered medications for this visit.    PHYSICAL EXAMINATION: ECOG PERFORMANCE STATUS: 1 - Symptomatic but completely ambulatory  Vitals:   08/13/19 0838  BP: (!) 146/93  Pulse: 75  Resp: 18  Temp: 98.5 F (36.9 C)  SpO2: 99%   Filed Weights   08/13/19 0838  Weight: 110 lb 9.6 oz (50.2 kg)    LABORATORY DATA:  I have reviewed the data as listed CMP Latest Ref Rng & Units 04/28/2016 02/14/2013  Glucose 70 - 99 mg/dL 71 78  BUN 6 - 23 mg/dL 16 7  Creatinine 0.40 - 1.20 mg/dL 0.53 0.38(L)  Sodium 135 - 145 mEq/L 138 133(L)  Potassium 3.5 - 5.1 mEq/L 4.7 4.2  Chloride 96 - 112 mEq/L 108 95(L)  CO2 19 - 32 mEq/L 25 19  Calcium 8.4 - 10.5 mg/dL 9.7 9.9  Total Protein 6.0 - 8.3 g/dL 6.5 7.7  Total Bilirubin  0.2 - 1.2 mg/dL 0.3 0.6  Alkaline Phos 39 - 117 U/L 66 95  AST 0 - 37 U/L 18 117(H)  ALT 0 - 35 U/L 10 76(H)    Lab Results  Component Value Date   WBC 11.9 (H) 08/12/2019   HGB 14.1 08/12/2019   HCT 43.1 08/12/2019   MCV 103.6 (H) 08/12/2019   PLT 484 (H) 08/12/2019   NEUTROABS 9.2 (H) 08/12/2019    ASSESSMENT & PLAN:  Hereditary hemochromatosis ([place]) Genetic testing revealed homozygous C282Y mutation Current treatment:Previously underwent phlebotomy with the goal to keep ferritin less than 100 and saturation less than 50%, no phlebotomies done between August 2018 to January 2021.  Received phlebotomy since January 2021 every 2 weeks  Lab review: 03/24/2017:Ferritin10, iron saturation10% with a hemoglobin of 13.4 07/12/2017: Ferritin 18, iron saturation 66% with a hemoglobin of 14.8 09/18/2017: Ferritin 20, iron saturation 38%, hemoglobin 15.1 05/09/2018: Ferritin: 35, iron saturation 68%, hemoglobin 15.6, platelets 899 (increased from 650) 12/18/2018: Ferritin 60, iron saturation 67%, hemoglobin 15, platelet count 993 05/02/2019: Ferritin 85, iron saturation 105%, hemoglobin 13.6, platelets 521 08/12/2019: Ferritin 21, iron saturation 56%, hemoglobin 14.1, platelets 484  Discussion:Iron saturation has come down by 50%.  I recommended that we move to phlebotomies every 3 weeks We will see her back in 3 months with labs done ahead of time and follow-up.   No orders  of the defined types were placed in this encounter.  The patient has a good understanding of the overall plan. she agrees with it. she will call with any problems that may develop before the next visit here.  Total time spent: 30 mins including face to face time and time spent for planning, charting and coordination of care  Nicholas Lose, MD 08/13/2019  I, Cloyde Reams Dorshimer, am acting as scribe for Dr. Nicholas Lose.  I have reviewed the above documentation for accuracy and completeness, and I agree with the above.

## 2019-08-13 ENCOUNTER — Inpatient Hospital Stay (HOSPITAL_BASED_OUTPATIENT_CLINIC_OR_DEPARTMENT_OTHER): Payer: Medicare Other | Admitting: Hematology and Oncology

## 2019-08-13 ENCOUNTER — Other Ambulatory Visit: Payer: Self-pay

## 2019-08-13 ENCOUNTER — Inpatient Hospital Stay: Payer: Medicare Other

## 2019-08-13 DIAGNOSIS — Z79899 Other long term (current) drug therapy: Secondary | ICD-10-CM | POA: Diagnosis not present

## 2019-08-13 DIAGNOSIS — Z7982 Long term (current) use of aspirin: Secondary | ICD-10-CM | POA: Diagnosis not present

## 2019-08-13 NOTE — Patient Instructions (Signed)

## 2019-08-13 NOTE — Assessment & Plan Note (Signed)
Genetic testing revealed homozygous C282Y mutation Current treatment:Previously underwent phlebotomy with the goal to keep ferritin less than 100 and saturation less than 50%, no phlebotomies done between August 2018 to January 2021.  Received phlebotomy in January 2021  Lab review: 03/24/2017:Ferritin10, iron saturation10% with a hemoglobin of 13.4 07/12/2017: Ferritin 18, iron saturation 66% with a hemoglobin of 14.8 09/18/2017: Ferritin 20, iron saturation 38%, hemoglobin 15.1 05/09/2018: Ferritin: 35, iron saturation 68%, hemoglobin 15.6, platelets 899 (increased from 650) 12/18/2018: Ferritin 60, iron saturation 67%, hemoglobin 15, platelet count 993 05/02/2019: Ferritin 85, iron saturation 105%, hemoglobin 13.6, platelets 521 08/12/2019: Ferritin 21, iron saturation 56%, hemoglobin 14.1, platelets 484  Discussion:Iron saturation has come down by 50%.  I recommended that we move to phlebotomies every 2 months. We will see her back in 6 months with labs done ahead of time and follow-up.

## 2019-08-16 ENCOUNTER — Telehealth: Payer: Self-pay | Admitting: Hematology and Oncology

## 2019-08-16 NOTE — Telephone Encounter (Signed)
Scheduled per 04/27 los, patient has been called and notified.

## 2019-09-02 ENCOUNTER — Other Ambulatory Visit: Payer: Self-pay

## 2019-09-02 ENCOUNTER — Inpatient Hospital Stay: Payer: Medicare Other | Attending: Hematology and Oncology

## 2019-09-02 NOTE — Progress Notes (Signed)
Phlebotomy with 18G to right AC at 8:06, patient tolerated well, completed at 8:26 and obtained 515 g. Provided patient with food and liquids.

## 2019-09-02 NOTE — Patient Instructions (Signed)

## 2019-09-20 ENCOUNTER — Other Ambulatory Visit: Payer: Self-pay

## 2019-09-20 DIAGNOSIS — D473 Essential (hemorrhagic) thrombocythemia: Secondary | ICD-10-CM

## 2019-09-23 ENCOUNTER — Inpatient Hospital Stay: Payer: Medicare Other

## 2019-09-25 ENCOUNTER — Other Ambulatory Visit: Payer: Medicare Other

## 2019-09-27 ENCOUNTER — Inpatient Hospital Stay: Payer: Medicare Other | Attending: Hematology and Oncology

## 2019-09-27 ENCOUNTER — Other Ambulatory Visit: Payer: Self-pay

## 2019-09-27 ENCOUNTER — Inpatient Hospital Stay: Payer: Medicare Other

## 2019-09-27 DIAGNOSIS — D473 Essential (hemorrhagic) thrombocythemia: Secondary | ICD-10-CM

## 2019-09-27 LAB — CBC WITH DIFFERENTIAL (CANCER CENTER ONLY)
Abs Immature Granulocytes: 0.03 10*3/uL (ref 0.00–0.07)
Basophils Absolute: 0 10*3/uL (ref 0.0–0.1)
Basophils Relative: 0 %
Eosinophils Absolute: 0.1 10*3/uL (ref 0.0–0.5)
Eosinophils Relative: 2 %
HCT: 39.6 % (ref 36.0–46.0)
Hemoglobin: 13.2 g/dL (ref 12.0–15.0)
Immature Granulocytes: 0 %
Lymphocytes Relative: 28 %
Lymphs Abs: 2.1 10*3/uL (ref 0.7–4.0)
MCH: 33.2 pg (ref 26.0–34.0)
MCHC: 33.3 g/dL (ref 30.0–36.0)
MCV: 99.7 fL (ref 80.0–100.0)
Monocytes Absolute: 0.6 10*3/uL (ref 0.1–1.0)
Monocytes Relative: 7 %
Neutro Abs: 4.6 10*3/uL (ref 1.7–7.7)
Neutrophils Relative %: 63 %
Platelet Count: 555 10*3/uL — ABNORMAL HIGH (ref 150–400)
RBC: 3.97 MIL/uL (ref 3.87–5.11)
RDW: 13.7 % (ref 11.5–15.5)
WBC Count: 7.4 10*3/uL (ref 4.0–10.5)
nRBC: 0 % (ref 0.0–0.2)

## 2019-09-27 LAB — CMP (CANCER CENTER ONLY)
ALT: 11 U/L (ref 0–44)
AST: 18 U/L (ref 15–41)
Albumin: 4.6 g/dL (ref 3.5–5.0)
Alkaline Phosphatase: 72 U/L (ref 38–126)
Anion gap: 11 (ref 5–15)
BUN: 13 mg/dL (ref 8–23)
CO2: 22 mmol/L (ref 22–32)
Calcium: 9.7 mg/dL (ref 8.9–10.3)
Chloride: 106 mmol/L (ref 98–111)
Creatinine: 0.68 mg/dL (ref 0.44–1.00)
GFR, Est AFR Am: >60 mL/min (ref >60)
GFR, Estimated: >60 mL/min (ref >60)
Glucose, Bld: 92 mg/dL (ref 70–99)
Potassium: 4.6 mmol/L (ref 3.5–5.1)
Sodium: 139 mmol/L (ref 135–145)
Total Bilirubin: 0.3 mg/dL (ref 0.3–1.2)
Total Protein: 7.1 g/dL (ref 6.5–8.1)

## 2019-09-27 LAB — IRON AND TIBC
Iron: 48 ug/dL (ref 41–142)
Saturation Ratios: 15 % — ABNORMAL LOW (ref 21–57)
TIBC: 331 ug/dL (ref 236–444)
UIBC: 283 ug/dL (ref 120–384)

## 2019-09-27 LAB — FERRITIN: Ferritin: 6 ng/mL — ABNORMAL LOW (ref 11–307)

## 2019-09-27 NOTE — Progress Notes (Signed)
Kara [name] presents today for phlebotomy per MD orders. Phlebotomy procedure started at 0940 and ended at 0951 with 500  grams removed vua 20 G to RAC.  Patient observed for 30 minutes after procedure without any incident. Patient tolerated procedure well. Diet and nutrition offered.  IV needle removed intact.

## 2019-09-30 ENCOUNTER — Encounter: Payer: Self-pay | Admitting: Licensed Clinical Social Worker

## 2019-09-30 NOTE — Progress Notes (Signed)
[place] Work  Clinical Social Work was referred by Warren Lacy, Therapist, sports for support and resources. Per RN, "Aleila is a patient of ours who already cares for her mother (68 yo) and will be soon caring for an ill/disabled sister. She had some questions about care teams and seeking medical care".   Clinical Social Worker attempted to contact patient by phone  to offer support and assess for needs.  No answer, left VM with direct call back number.      Quintan Saldivar, [place], [place] Worker [facility]

## 2019-10-04 ENCOUNTER — Encounter: Payer: Self-pay | Admitting: Licensed Clinical Social Worker
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
## 2019-10-10 ENCOUNTER — Telehealth: Payer: Self-pay | Admitting: Hematology and Oncology

## 2019-10-10 NOTE — Telephone Encounter (Signed)
Added in lab appt per 6/24 sch message - left message with appt date and time

## 2019-10-14 ENCOUNTER — Inpatient Hospital Stay: Payer: Medicare Other

## 2019-10-14 ENCOUNTER — Other Ambulatory Visit: Payer: Self-pay | Admitting: Hematology and Oncology

## 2019-10-14 DIAGNOSIS — D473 Essential (hemorrhagic) thrombocythemia: Secondary | ICD-10-CM

## 2019-10-18 ENCOUNTER — Inpatient Hospital Stay: Payer: Medicare Other

## 2019-10-18 ENCOUNTER — Inpatient Hospital Stay: Payer: Medicare Other | Attending: Hematology and Oncology

## 2019-10-18 DIAGNOSIS — Z7982 Long term (current) use of aspirin: Secondary | ICD-10-CM | POA: Insufficient documentation

## 2019-10-18 DIAGNOSIS — D473 Essential (hemorrhagic) thrombocythemia: Secondary | ICD-10-CM | POA: Insufficient documentation

## 2019-10-18 DIAGNOSIS — R7989 Other specified abnormal findings of blood chemistry: Secondary | ICD-10-CM | POA: Insufficient documentation

## 2019-10-18 DIAGNOSIS — Z79899 Other long term (current) drug therapy: Secondary | ICD-10-CM | POA: Insufficient documentation

## 2019-11-03 NOTE — Progress Notes (Signed)
Patient Care Team: Patient, No Pcp Per as PCP - General (General Practice) Nicholas Lose, MD as Consulting Physician (Hematology and Oncology)  DIAGNOSIS:    ICD-10-CM   1. Essential thrombocytosis ([facility])  D47.3 CBC with Differential (Cancer Center Only)    Ferritin    Iron and TIBC  2. Hereditary hemochromatosis ([place])  E83.110 CBC with Differential (Cancer Center Only)    Ferritin    Iron and TIBC    CHIEF COMPLIANT: Follow-up ofthrombocytosis and hereditary hemochromatosis  INTERVAL HISTORY: Kara [name] is a 69 y.o. with above-mentioned history of thrombocytosis and hereditary hemochromatosis who is currently on 81mg  Aspirin and has received phlebotomies every 3 weeks. She presentsto the clinictoday for follow-up to review her labs.She has had some fatigue as a result of hydroxyurea.  ALLERGIES:  is allergic to penicillins.  MEDICATIONS:  Current Outpatient Medications  Medication Sig Dispense Refill  . aspirin EC 81 MG tablet Take 1 tablet (81 mg total) by mouth daily.    . hydroxyurea (HYDREA) 500 MG capsule TAKE 1 CAPSULE(500 MG) BY MOUTH DAILY. MAY TAKE WITH FOOD TO MINIMIZE GI SIDE EFFECTS 30 capsule 3  . varenicline (CHANTIX CONTINUING MONTH PAK) 1 MG tablet Take 1 tablet (1 mg total) by mouth 2 (two) times daily. 60 tablet 1  . varenicline (CHANTIX) 0.5 MG tablet Chantix 0.5 mg tablet  Take 1 tablet twice a day by oral route for 3 days.     No current facility-administered medications for this visit.    PHYSICAL EXAMINATION: ECOG PERFORMANCE STATUS: 1 - Symptomatic but completely ambulatory  Vitals:   11/04/19 0828  BP: 133/80  Pulse: 79  Resp: 18  Temp: 99.1 F (37.3 C)  SpO2: 100%   Filed Weights   11/04/19 0828  Weight: 106 lb 3.2 oz (48.2 kg)    LABORATORY DATA:  I have reviewed the data as listed CMP Latest Ref Rng & Units 11/04/2019 09/27/2019 04/28/2016  Glucose 70 - 99 mg/dL 99 92 71  BUN 8 - 23 mg/dL 16 13 16   Creatinine 0.44 - 1.00 mg/dL  0.72 0.68 0.53  Sodium 135 - 145 mmol/L 139 139 138  Potassium 3.5 - 5.1 mmol/L 4.4 4.6 4.7  Chloride 98 - 111 mmol/L 106 106 108  CO2 22 - 32 mmol/L 23 22 25   Calcium 8.9 - 10.3 mg/dL 9.9 9.7 9.7  Total Protein 6.5 - 8.1 g/dL 7.5 7.1 6.5  Total Bilirubin 0.3 - 1.2 mg/dL 0.3 0.3 0.3  Alkaline Phos 38 - 126 U/L 65 72 66  AST 15 - 41 U/L 24 18 18   ALT 0 - 44 U/L 10 11 10     Lab Results  Component Value Date   WBC 8.4 11/04/2019   HGB 12.4 11/04/2019   HCT 38.6 11/04/2019   MCV 95.1 11/04/2019   PLT 680 (H) 11/04/2019   NEUTROABS 5.5 11/04/2019    ASSESSMENT & PLAN:  Hereditary hemochromatosis ([place]) Genetic testing revealed homozygous C282Y mutation Current treatment:Previously underwent phlebotomy with the goal to keep ferritin less than 100 and saturation less than 50%, no phlebotomies done between August 2018 to January 2021.  Received phlebotomy since January 2021 every 2 weeks  Lab review: 03/24/2017:Ferritin10, iron saturation10% with a hemoglobin of 13.4 07/12/2017: Ferritin 18, iron saturation 66% with a hemoglobin of 14.8 09/18/2017: Ferritin 20, iron saturation 38%, hemoglobin 15.1 05/09/2018: Ferritin: 35, iron saturation 68%, hemoglobin 15.6, platelets 899 (increased from 650) 12/18/2018: Ferritin 60, iron saturation 67%, hemoglobin 15, platelet count 993  05/02/2019: Ferritin 85, iron saturation 105%, hemoglobin 13.6, platelets 521 08/12/2019: Ferritin 21, iron saturation 56%, hemoglobin 14.1, platelets 484 09/27/2019: Ferritin 6, iron saturation 15%, hemoglobin 13.2, platelets 555  Discussion:Iron saturation has come down by 15%.  I recommended that we move to phlebotomies every 4 weeks  Essential thrombocytosis: Currently on Hydrea 68 mg daily. Hydrea toxicities: Fatigue  We will see her back in 3 months with labs done ahead of time and follow-up.    Orders Placed This Encounter  Procedures  . CBC with Differential (Cancer Center Only)    Standing Status:    Future    Standing Expiration Date:   11/03/2020  . Ferritin    Standing Status:   Future    Standing Expiration Date:   11/03/2020  . Iron and TIBC    Standing Status:   Future    Standing Expiration Date:   11/03/2020   The patient has a good understanding of the overall plan. she agrees with it. she will call with any problems that may develop before the next visit here.  Total time spent: 30 mins including face to face time and time spent for planning, charting and coordination of care  Nicholas Lose, MD 11/04/2019  I, Cloyde Reams Dorshimer, am acting as scribe for Dr. Nicholas Lose.  I have reviewed the above documentation for accuracy and completeness, and I agree with the above.

## 2019-11-04 ENCOUNTER — Encounter (HOSPITAL_COMMUNITY): Payer: Self-pay

## 2019-11-04 ENCOUNTER — Other Ambulatory Visit: Payer: Self-pay

## 2019-11-04 ENCOUNTER — Inpatient Hospital Stay: Payer: Medicare Other

## 2019-11-04 ENCOUNTER — Emergency Department (HOSPITAL_COMMUNITY)
Admission: EM | Admit: 2019-11-04 | Discharge: 2019-11-04 | Disposition: A | Discharge status: Home / Self Care | Payer: Medicare Other | Source: Home / Self Care | Attending: Emergency Medicine | Admitting: Emergency Medicine

## 2019-11-04 ENCOUNTER — Inpatient Hospital Stay (HOSPITAL_BASED_OUTPATIENT_CLINIC_OR_DEPARTMENT_OTHER): Payer: Medicare Other | Admitting: Hematology and Oncology

## 2019-11-04 VITALS — BP 133/80 | HR 79 | Temp 99.1°F | Resp 18 | Ht 60.0 in | Wt 106.2 lb

## 2019-11-04 DIAGNOSIS — R5383 Other fatigue: Secondary | ICD-10-CM | POA: Insufficient documentation

## 2019-11-04 DIAGNOSIS — R55 Syncope and collapse: Secondary | ICD-10-CM | POA: Insufficient documentation

## 2019-11-04 DIAGNOSIS — F17200 Nicotine dependence, unspecified, uncomplicated: Secondary | ICD-10-CM | POA: Insufficient documentation

## 2019-11-04 DIAGNOSIS — D473 Essential (hemorrhagic) thrombocythemia: Secondary | ICD-10-CM

## 2019-11-04 DIAGNOSIS — I959 Hypotension, unspecified: Secondary | ICD-10-CM | POA: Diagnosis not present

## 2019-11-04 DIAGNOSIS — R61 Generalized hyperhidrosis: Secondary | ICD-10-CM | POA: Diagnosis not present

## 2019-11-04 DIAGNOSIS — Z7982 Long term (current) use of aspirin: Secondary | ICD-10-CM | POA: Insufficient documentation

## 2019-11-04 DIAGNOSIS — Z79899 Other long term (current) drug therapy: Secondary | ICD-10-CM | POA: Diagnosis not present

## 2019-11-04 DIAGNOSIS — R42 Dizziness and giddiness: Secondary | ICD-10-CM | POA: Diagnosis not present

## 2019-11-04 DIAGNOSIS — R7989 Other specified abnormal findings of blood chemistry: Secondary | ICD-10-CM | POA: Diagnosis not present

## 2019-11-04 LAB — CBC WITH DIFFERENTIAL (CANCER CENTER ONLY)
Abs Immature Granulocytes: 0.02 10*3/uL (ref 0.00–0.07)
Basophils Absolute: 0 10*3/uL (ref 0.0–0.1)
Basophils Relative: 1 %
Eosinophils Absolute: 0.2 10*3/uL (ref 0.0–0.5)
Eosinophils Relative: 2 %
HCT: 38.6 % (ref 36.0–46.0)
Hemoglobin: 12.4 g/dL (ref 12.0–15.0)
Immature Granulocytes: 0 %
Lymphocytes Relative: 25 %
Lymphs Abs: 2.1 10*3/uL (ref 0.7–4.0)
MCH: 30.5 pg (ref 26.0–34.0)
MCHC: 32.1 g/dL (ref 30.0–36.0)
MCV: 95.1 fL (ref 80.0–100.0)
Monocytes Absolute: 0.6 10*3/uL (ref 0.1–1.0)
Monocytes Relative: 7 %
Neutro Abs: 5.5 10*3/uL (ref 1.7–7.7)
Neutrophils Relative %: 65 %
Platelet Count: 680 10*3/uL — ABNORMAL HIGH (ref 150–400)
RBC: 4.06 MIL/uL (ref 3.87–5.11)
RDW: 14.1 % (ref 11.5–15.5)
WBC Count: 8.4 10*3/uL (ref 4.0–10.5)
nRBC: 0 % (ref 0.0–0.2)

## 2019-11-04 LAB — CMP (CANCER CENTER ONLY)
ALT: 10 U/L (ref 0–44)
AST: 24 U/L (ref 15–41)
Albumin: 4.6 g/dL (ref 3.5–5.0)
Alkaline Phosphatase: 65 U/L (ref 38–126)
Anion gap: 10 (ref 5–15)
BUN: 16 mg/dL (ref 8–23)
CO2: 23 mmol/L (ref 22–32)
Calcium: 9.9 mg/dL (ref 8.9–10.3)
Chloride: 106 mmol/L (ref 98–111)
Creatinine: 0.72 mg/dL (ref 0.44–1.00)
GFR, Est AFR Am: >60 mL/min (ref >60)
GFR, Estimated: >60 mL/min (ref >60)
Glucose, Bld: 99 mg/dL (ref 70–99)
Potassium: 4.4 mmol/L (ref 3.5–5.1)
Sodium: 139 mmol/L (ref 135–145)
Total Bilirubin: 0.3 mg/dL (ref 0.3–1.2)
Total Protein: 7.5 g/dL (ref 6.5–8.1)

## 2019-11-04 LAB — BASIC METABOLIC PANEL
Anion gap: 8 (ref 5–15)
BUN: 20 mg/dL (ref 8–23)
CO2: 25 mmol/L (ref 22–32)
Calcium: 9 mg/dL (ref 8.9–10.3)
Chloride: 104 mmol/L (ref 98–111)
Creatinine, Ser: 0.58 mg/dL (ref 0.44–1.00)
GFR calc Af Amer: >60 mL/min (ref >60)
GFR calc non Af Amer: >60 mL/min (ref >60)
Glucose, Bld: 121 mg/dL — ABNORMAL HIGH (ref 70–99)
Potassium: 4.5 mmol/L (ref 3.5–5.1)
Sodium: 137 mmol/L (ref 135–145)

## 2019-11-04 LAB — CBC WITH DIFFERENTIAL/PLATELET
Abs Immature Granulocytes: 0.07 10*3/uL (ref 0.00–0.07)
Basophils Absolute: 0 10*3/uL (ref 0.0–0.1)
Basophils Relative: 0 %
Eosinophils Absolute: 0.1 10*3/uL (ref 0.0–0.5)
Eosinophils Relative: 1 %
HCT: 33.6 % — ABNORMAL LOW (ref 36.0–46.0)
Hemoglobin: 10.6 g/dL — ABNORMAL LOW (ref 12.0–15.0)
Immature Granulocytes: 1 %
Lymphocytes Relative: 8 %
Lymphs Abs: 1.3 10*3/uL (ref 0.7–4.0)
MCH: 30.7 pg (ref 26.0–34.0)
MCHC: 31.5 g/dL (ref 30.0–36.0)
MCV: 97.4 fL (ref 80.0–100.0)
Monocytes Absolute: 0.7 10*3/uL (ref 0.1–1.0)
Monocytes Relative: 5 %
Neutro Abs: 13.2 10*3/uL — ABNORMAL HIGH (ref 1.7–7.7)
Neutrophils Relative %: 85 %
Platelets: 584 10*3/uL — ABNORMAL HIGH (ref 150–400)
RBC: 3.45 MIL/uL — ABNORMAL LOW (ref 3.87–5.11)
RDW: 14.2 % (ref 11.5–15.5)
WBC: 15.4 10*3/uL — ABNORMAL HIGH (ref 4.0–10.5)
nRBC: 0 % (ref 0.0–0.2)

## 2019-11-04 LAB — FERRITIN: Ferritin: 6 ng/mL — ABNORMAL LOW (ref 11–307)

## 2019-11-04 LAB — IRON AND TIBC
Iron: 34 ug/dL — ABNORMAL LOW (ref 41–142)
Saturation Ratios: 10 % — ABNORMAL LOW (ref 21–57)
TIBC: 334 ug/dL (ref 236–444)
UIBC: 300 ug/dL (ref 120–384)

## 2019-11-04 LAB — CBG MONITORING, ED: Glucose-Capillary: 119 mg/dL — ABNORMAL HIGH (ref 70–99)

## 2019-11-04 MED ORDER — SODIUM CHLORIDE 0.9 % IV BOLUS
1000.0000 mL | Freq: Once | INTRAVENOUS | Status: AC
  Administered 2019-11-04: 1000 mL via INTRAVENOUS

## 2019-11-04 NOTE — Discharge Instructions (Signed)
Please drink plenty of fluids Make a follow up with Dr. Gwenlyn Found (Cardiology) Return to the ER if you have worsening symptoms

## 2019-11-04 NOTE — Assessment & Plan Note (Signed)
Genetic testing revealed homozygous C282Y mutation Current treatment:Previously underwent phlebotomy with the goal to keep ferritin less than 100 and saturation less than 50%, no phlebotomies done between August 2018 to January 2021.  Received phlebotomy since January 2021 every 2 weeks  Lab review: 03/24/2017:Ferritin10, iron saturation10% with a hemoglobin of 13.4 07/12/2017: Ferritin 18, iron saturation 66% with a hemoglobin of 14.8 09/18/2017: Ferritin 20, iron saturation 38%, hemoglobin 15.1 05/09/2018: Ferritin: 35, iron saturation 68%, hemoglobin 15.6, platelets 899 (increased from 650) 12/18/2018: Ferritin 60, iron saturation 67%, hemoglobin 15, platelet count 993 05/02/2019: Ferritin 85, iron saturation 105%, hemoglobin 13.6, platelets 521 08/12/2019: Ferritin 21, iron saturation 56%, hemoglobin 14.1, platelets 484 09/27/2019: Ferritin 6, iron saturation 15%, hemoglobin 13.2, platelets 555  Discussion:Iron saturation has come down by 15%.  I recommended that we move to phlebotomies every 5 weeks We will see her back in 3 months with labs done ahead of time and follow-up.

## 2019-11-04 NOTE — Progress Notes (Signed)
Tonyetta Berko presents today for phlebotomy per MD orders. Phlebotomy procedure started at 0950 and ended at 1005 via 20g IV needle.  520 grams removed.  IV needle removed intact.  Patient tolerated procedure well.  Observed for 30 minutes after procedure without any incident.  Snacks provided.  VSS.

## 2019-11-04 NOTE — ED Triage Notes (Signed)
Pt came in via EMS for Near syncopal episode. Pt was at Cancer center earlier for Hemochromotosis procedure. Went out shopping and felt faint walking back to the car. EMS reported pt wa diaphoretic and dizzy on scene with BP 88/64.

## 2019-11-04 NOTE — ED Provider Notes (Signed)
[facility] DEPT Provider Note   CSN: [id number] Arrival date & time: 11/04/19  1215   History Chief Complaint  Patient presents with  . Near Syncope    Kara [name] is a 69 y.o. female with history of hereditary hemochromatosis and thrombocytosis who presents with near syncope. Pt states that she had therapeutic phelbotomy this morning around 11AM. She felt fine during and immediately afterwards. Then she went to [employer] and was standing in line for about 5 min and started to feel very sweaty and weak. She went to her car to lie down because she was afraid that any movement she would make she would pass out. She was able to call EMS. They noted she was hypotensive with BP 88/64. She states she has had similar episodes years ago which was attributed to orthostatic hypotension. She states that those episodes were different because they were more severe and short lived whereas this was milder but lasted longer. She feels almost back to baseline but still feels tired. Otherwise she denies any other symptoms. She had a mild headache during the episode which is resolved. No chest pain, SOB, abdominal pain, recent fever. She states she didn't drink any water this AM. Per chart review she has had an extensive evaluation for near-syncope/syncope in 2018 and symptoms seem to spontaneously resolve.  30 days cardiac monitoring October 13 2017 was normal US carotid showed less than 39% stenosis of bilateral internal carotid artery July 2018 ECHO: 60-65%.  Wall motion was nromal. In May 2018 EEG was normal in March 2018  MRI of the brain on July 17, 2016, mild supratentorium small vessel disease, no acute abnormality.  HPI     Past Medical History:  Diagnosis Date  . Hemochromatosis   . Migraine     Patient Active Problem List   Diagnosis Date Noted  . Essential thrombocytosis ([place]) 05/22/2018  . Chronic migraine 11/21/2017  . Hemochromatosis 06/01/2016  . Benign  paroxysmal positional vertigo 05/20/2016  . Orthostatic hypotension 05/20/2016  . Nasal obstruction 05/19/2016  . Light headedness 04/28/2016  . Screening for hyperlipidemia 04/28/2016    Past Surgical History:  Procedure Laterality Date  . NASAL SEPTUM SURGERY       OB History   No obstetric history on file.     Family History  Problem Relation Age of Onset  . Healthy Mother   . Acute myelogenous leukemia Father   . Bipolar disorder Sister   . Diabetes Neg Hx   . Cancer Neg Hx   . Heart failure Neg Hx   . Hyperlipidemia Neg Hx   . Hypertension Neg Hx     Social History   Tobacco Use  . Smoking status: Current Every Day Smoker    Packs/day: 1.00  . Smokeless tobacco: Never Used  Substance Use Topics  . Alcohol use: No    Comment: former heavy drinker now sober  . Drug use: No    Home Medications Prior to Admission medications   Medication Sig Start Date End Date Taking? Authorizing Provider  aspirin EC 81 MG tablet Take 1 tablet (81 mg total) by mouth daily. 12/20/18   Nicholas Lose, MD  hydroxyurea (HYDREA) 500 MG capsule TAKE 1 CAPSULE(500 MG) BY MOUTH DAILY. MAY TAKE WITH FOOD TO MINIMIZE GI SIDE EFFECTS 06/10/19   Nicholas Lose, MD  varenicline (CHANTIX CONTINUING MONTH PAK) 1 MG tablet Take 1 tablet (1 mg total) by mouth 2 (two) times daily. 12/20/18   Nicholas Lose, MD  varenicline (CHANTIX) 0.5 MG tablet Chantix 0.5 mg tablet  Take 1 tablet twice a day by oral route for 3 days.    [provider]    Allergies    Penicillins  Review of Systems   Review of Systems  Constitutional: Positive for fatigue. Negative for chills and fever.  Respiratory: Negative for shortness of breath.   Cardiovascular: Negative for chest pain.  Gastrointestinal: Negative for abdominal pain, diarrhea, nausea and vomiting.  Genitourinary: Negative for dysuria.  Neurological: Positive for light-headedness and headaches (mild, resolved). Negative for syncope.     Physical Exam Updated Vital Signs BP 102/65 (BP Location: Right Arm)   Pulse 67   Temp (!) 97.5 F (36.4 C) (Oral)   Resp 16   Ht 5\' 1"  (1.549 m)   Wt 47.6 kg   SpO2 100%   BMI 19.84 kg/m   Physical Exam Vitals and nursing note reviewed.  Constitutional:      General: She is not in acute distress.    Appearance: Normal appearance. She is well-developed. She is not ill-appearing.     Comments: Calm and cooperative. NAD  HENT:     Head: Normocephalic and atraumatic.  Eyes:     General: No scleral icterus.       Right eye: No discharge.        Left eye: No discharge.     Conjunctiva/sclera: Conjunctivae normal.     Pupils: Pupils are equal, round, and reactive to light.  Cardiovascular:     Rate and Rhythm: Normal rate and regular rhythm.  Pulmonary:     Effort: Pulmonary effort is normal. No respiratory distress.     Breath sounds: Normal breath sounds.  Abdominal:     General: There is no distension.     Palpations: Abdomen is soft.     Tenderness: There is no abdominal tenderness.  Musculoskeletal:     Cervical back: Normal range of motion.     Right lower leg: No edema.     Left lower leg: No edema.  Skin:    General: Skin is warm and dry.  Neurological:     Mental Status: She is alert and oriented to person, place, and time.  Psychiatric:        Behavior: Behavior normal.     ED Results / Procedures / Treatments   Labs (all labs ordered are listed, but only abnormal results are displayed) Labs Reviewed  BASIC METABOLIC PANEL - Abnormal; Notable for the following components:      Result Value   Glucose, Bld 121 (*)    All other components within normal limits  CBC WITH DIFFERENTIAL/PLATELET - Abnormal; Notable for the following components:   WBC 15.4 (*)    RBC 3.45 (*)    Hemoglobin 10.6 (*)    HCT 33.6 (*)    Platelets 584 (*)    Neutro Abs 13.2 (*)    All other components within normal limits  CBG MONITORING, ED - Abnormal; Notable for the  following components:   Glucose-Capillary 119 (*)    All other components within normal limits    EKG EKG Interpretation  Date/Time:  Monday November 04 2019 14:02:56 EDT Ventricular Rate:  67 PR Interval:    QRS Duration: 77 QT Interval:  396 QTC Calculation: 418 R Axis:   70 Text Interpretation: Sinus rhythm No acute changes No old tracing to compare Confirmed by Varney Biles [id number]) on 11/04/2019 3:53:50 PM   Radiology No results found.  Procedures Procedures (including critical care time)  Medications Ordered in ED Medications  sodium chloride 0.9 % bolus 1,000 mL (0 mLs Intravenous Stopped 11/04/19 1553)    ED Course  I have reviewed the triage vital signs and the nursing notes.  Pertinent labs & imaging results that were available during my care of the patient were reviewed by me and considered in my medical decision making (see chart for details).  69 year old female presents with near sycnope. Likely vasovagal vs orthostatic with clear prodromal symptoms. BP here is lower than her baseline. Pt does endorse not drinking any fluids this morning and had blood drawn. EKG is SR. Will obtain labs, CBG and orthostatic vital signs  CBC shows new leukocytosis since this AM which is likely reactive. Hgb is lower as well - unclear if this is related to recent blood draw. Orthostatics are negative:  Orthostatic VS for the past 24 hrs (Last 3 readings):  BP- Lying Pulse- Lying BP- Sitting Pulse- Sitting BP- Standing at 0 minutes Pulse- Standing at 0 minutes  11/04/19 1352 99/63 68 104/67 77 105/66 75    Pt feels back to baseline for the most part. She has had an extensive work up for [employer] a couple years ago. She was given 1L of fluid. Shared visit with Dr. Kathrynn Humble. Recommended f/u with Cardiology and return precautions given  MDM Rules/Calculators/A&P                           Final Clinical Impression(s) / ED Diagnoses Final diagnoses:  Near syncope    Rx  / DC Orders ED Discharge Orders    None       Recardo Evangelist, PA-C 11/04/19 Domenic Moras, MD 11/05/19 1002

## 2019-11-05 ENCOUNTER — Other Ambulatory Visit: Payer: Self-pay | Admitting: *Deleted

## 2019-11-05 ENCOUNTER — Telehealth: Payer: Self-pay | Admitting: Hematology and Oncology

## 2019-11-05 NOTE — Telephone Encounter (Signed)
Scheduled per 7/19 los. Called and spoke with pt, confirmed added appts

## 2019-11-05 NOTE — Progress Notes (Signed)
Received call from pt stating post phlebotomy yesterday, pt went shopping and experienced near syncopal episode. Pt states she was dehydrated yesterday and did not drink any water.  Per MD syncopal episode related to dehydration and phlebotomy.  Per MD pt to receive 500 mL NS post phlebotomy's in the future.  Pt verbalized understanding and appreciative of advice.

## 2019-11-06 ENCOUNTER — Other Ambulatory Visit: Payer: Medicare Other

## 2019-11-06 ENCOUNTER — Ambulatory Visit: Payer: Medicare Other | Admitting: Hematology and Oncology

## 2019-11-06 ENCOUNTER — Telehealth: Payer: Self-pay | Admitting: Hematology and Oncology

## 2019-11-06 NOTE — Telephone Encounter (Signed)
Scheduled appt per 7/20 sch msg - pt is aware of appt date and time

## 2019-11-25 ENCOUNTER — Ambulatory Visit: Payer: Medicare Other

## 2019-11-28 ENCOUNTER — Other Ambulatory Visit: Payer: Self-pay

## 2019-11-28 ENCOUNTER — Inpatient Hospital Stay: Payer: Medicare Other

## 2019-11-28 ENCOUNTER — Inpatient Hospital Stay: Payer: Medicare Other | Attending: Hematology and Oncology

## 2019-11-28 DIAGNOSIS — D473 Essential (hemorrhagic) thrombocythemia: Secondary | ICD-10-CM

## 2019-11-28 LAB — IRON AND TIBC
Iron: 16 ug/dL — ABNORMAL LOW (ref 41–142)
Saturation Ratios: 4 % — ABNORMAL LOW (ref 21–57)
TIBC: 363 ug/dL (ref 236–444)
UIBC: 347 ug/dL (ref 120–384)

## 2019-11-28 LAB — CBC WITH DIFFERENTIAL (CANCER CENTER ONLY)
Abs Immature Granulocytes: 0.05 10*3/uL (ref 0.00–0.07)
Basophils Absolute: 0 10*3/uL (ref 0.0–0.1)
Basophils Relative: 0 %
Eosinophils Absolute: 0.2 10*3/uL (ref 0.0–0.5)
Eosinophils Relative: 2 %
HCT: 36.8 % (ref 36.0–46.0)
Hemoglobin: 11.7 g/dL — ABNORMAL LOW (ref 12.0–15.0)
Immature Granulocytes: 0 %
Lymphocytes Relative: 14 %
Lymphs Abs: 1.7 10*3/uL (ref 0.7–4.0)
MCH: 28.7 pg (ref 26.0–34.0)
MCHC: 31.8 g/dL (ref 30.0–36.0)
MCV: 90.2 fL (ref 80.0–100.0)
Monocytes Absolute: 0.6 10*3/uL (ref 0.1–1.0)
Monocytes Relative: 5 %
Neutro Abs: 9.4 10*3/uL — ABNORMAL HIGH (ref 1.7–7.7)
Neutrophils Relative %: 79 %
Platelet Count: 389 10*3/uL (ref 150–400)
RBC: 4.08 MIL/uL (ref 3.87–5.11)
RDW: 14.7 % (ref 11.5–15.5)
WBC Count: 12 10*3/uL — ABNORMAL HIGH (ref 4.0–10.5)
nRBC: 0 % (ref 0.0–0.2)

## 2019-11-28 LAB — FERRITIN: Ferritin: <4 ng/mL — ABNORMAL LOW (ref 11–307)

## 2019-11-28 MED ORDER — SODIUM CHLORIDE 0.9 % IV SOLN
Freq: Once | INTRAVENOUS | Status: AC
  Filled 2019-11-28: qty 250

## 2019-11-28 NOTE — Patient Instructions (Signed)

## 2019-11-28 NOTE — Progress Notes (Signed)
Patient presenting for therapeutic phlebotomy per MD orders.  Phlebotomy procedure began at 1018 and ended at 1040 via 20 G needle in L FA. Patient began to feel poorly at the end of the procedure following the removal of 504 g of blood. Patient became faint, diaphoretic, and nauseated. Patient give bolus of 500 ml of post fluids and vital signs obtained. Dr. Lindi Adie called to the bedside to assess patient. Received verbal orders for additional liter of fluids.  Patient received additional fluids and observed. Food and drinks provided. Vital signs remained stable and patient stated that she was feeling better. No signs of distress.

## 2019-12-18 ENCOUNTER — Other Ambulatory Visit: Payer: Self-pay | Admitting: *Deleted

## 2019-12-19 ENCOUNTER — Inpatient Hospital Stay: Payer: Medicare Other | Attending: Hematology and Oncology

## 2020-01-09 ENCOUNTER — Inpatient Hospital Stay: Payer: Medicare Other

## 2020-01-09 ENCOUNTER — Other Ambulatory Visit: Payer: Self-pay

## 2020-01-09 LAB — CBC WITH DIFFERENTIAL (CANCER CENTER ONLY)
Abs Immature Granulocytes: 0.02 10*3/uL (ref 0.00–0.07)
Basophils Absolute: 0.1 10*3/uL (ref 0.0–0.1)
Basophils Relative: 1 %
Eosinophils Absolute: 0.1 10*3/uL (ref 0.0–0.5)
Eosinophils Relative: 2 %
HCT: 34.5 % — ABNORMAL LOW (ref 36.0–46.0)
Hemoglobin: 10.6 g/dL — ABNORMAL LOW (ref 12.0–15.0)
Immature Granulocytes: 0 %
Lymphocytes Relative: 29 %
Lymphs Abs: 2.1 10*3/uL (ref 0.7–4.0)
MCH: 25.5 pg — ABNORMAL LOW (ref 26.0–34.0)
MCHC: 30.7 g/dL (ref 30.0–36.0)
MCV: 82.9 fL (ref 80.0–100.0)
Monocytes Absolute: 0.6 10*3/uL (ref 0.1–1.0)
Monocytes Relative: 8 %
Neutro Abs: 4.4 10*3/uL (ref 1.7–7.7)
Neutrophils Relative %: 60 %
Platelet Count: 816 10*3/uL — ABNORMAL HIGH (ref 150–400)
RBC: 4.16 MIL/uL (ref 3.87–5.11)
RDW: 16.1 % — ABNORMAL HIGH (ref 11.5–15.5)
WBC Count: 7.3 10*3/uL (ref 4.0–10.5)
nRBC: 0 % (ref 0.0–0.2)

## 2020-01-09 LAB — IRON AND TIBC
Iron: 12 ug/dL — ABNORMAL LOW (ref 41–142)
Saturation Ratios: 3 % — ABNORMAL LOW (ref 21–57)
TIBC: 375 ug/dL (ref 236–444)
UIBC: 363 ug/dL (ref 120–384)

## 2020-01-09 LAB — FERRITIN: Ferritin: <4 ng/mL — ABNORMAL LOW (ref 11–307)

## 2020-01-23 ENCOUNTER — Other Ambulatory Visit: Payer: Self-pay | Admitting: *Deleted

## 2020-01-23 DIAGNOSIS — D473 Essential (hemorrhagic) thrombocythemia: Secondary | ICD-10-CM

## 2020-01-24 ENCOUNTER — Ambulatory Visit: Payer: Medicare Other | Admitting: Hematology and Oncology

## 2020-01-24 ENCOUNTER — Telehealth: Payer: Self-pay | Admitting: Hematology and Oncology

## 2020-01-24 ENCOUNTER — Inpatient Hospital Stay: Payer: Medicare Other

## 2020-01-24 NOTE — Telephone Encounter (Signed)
R/s lab appt from today per patient request. Moved to 10/11. Confirmed with patient

## 2020-01-27 ENCOUNTER — Inpatient Hospital Stay: Payer: Medicare Other | Attending: Hematology and Oncology

## 2020-01-27 ENCOUNTER — Other Ambulatory Visit: Payer: Self-pay

## 2020-01-27 ENCOUNTER — Ambulatory Visit: Payer: Medicare Other | Admitting: Hematology and Oncology

## 2020-01-27 DIAGNOSIS — D473 Essential (hemorrhagic) thrombocythemia: Secondary | ICD-10-CM

## 2020-01-27 DIAGNOSIS — Z7982 Long term (current) use of aspirin: Secondary | ICD-10-CM | POA: Diagnosis not present

## 2020-01-27 DIAGNOSIS — Z79899 Other long term (current) drug therapy: Secondary | ICD-10-CM | POA: Diagnosis not present

## 2020-01-27 DIAGNOSIS — R5383 Other fatigue: Secondary | ICD-10-CM | POA: Insufficient documentation

## 2020-01-27 LAB — CBC WITH DIFFERENTIAL (CANCER CENTER ONLY)
Abs Immature Granulocytes: 0.03 10*3/uL (ref 0.00–0.07)
Basophils Absolute: 0.1 10*3/uL (ref 0.0–0.1)
Basophils Relative: 1 %
Eosinophils Absolute: 0.2 10*3/uL (ref 0.0–0.5)
Eosinophils Relative: 2 %
HCT: 34.4 % — ABNORMAL LOW (ref 36.0–46.0)
Hemoglobin: 10.7 g/dL — ABNORMAL LOW (ref 12.0–15.0)
Immature Granulocytes: 0 %
Lymphocytes Relative: 22 %
Lymphs Abs: 1.8 10*3/uL (ref 0.7–4.0)
MCH: 25.1 pg — ABNORMAL LOW (ref 26.0–34.0)
MCHC: 31.1 g/dL (ref 30.0–36.0)
MCV: 80.6 fL (ref 80.0–100.0)
Monocytes Absolute: 0.6 10*3/uL (ref 0.1–1.0)
Monocytes Relative: 7 %
Neutro Abs: 5.4 10*3/uL (ref 1.7–7.7)
Neutrophils Relative %: 68 %
Platelet Count: 350 10*3/uL (ref 150–400)
RBC: 4.27 MIL/uL (ref 3.87–5.11)
RDW: 17.4 % — ABNORMAL HIGH (ref 11.5–15.5)
WBC Count: 8.1 10*3/uL (ref 4.0–10.5)
nRBC: 0 % (ref 0.0–0.2)

## 2020-01-27 LAB — IRON AND TIBC
Iron: 17 ug/dL — ABNORMAL LOW (ref 41–142)
Saturation Ratios: 4 % — ABNORMAL LOW (ref 21–57)
TIBC: 369 ug/dL (ref 236–444)
UIBC: 352 ug/dL (ref 120–384)

## 2020-01-27 LAB — FERRITIN: Ferritin: <4 ng/mL — ABNORMAL LOW (ref 11–307)

## 2020-01-29 NOTE — Progress Notes (Signed)
Patient Care Team: Patient, No Pcp Per as PCP - General (General Practice) Nicholas Lose, MD as Consulting Physician (Hematology and Oncology)  DIAGNOSIS:    ICD-10-CM   1. Essential thrombocytosis (HCC)  D47.3   2. Hereditary hemochromatosis ([place])  E83.110     CHIEF COMPLIANT: Follow-up ofthrombocytosis and hereditary hemochromatosis  INTERVAL HISTORY: Kara [name] is a 69 y.o. with above-mentioned history of thrombocytosis and hereditary hemochromatosis who is currently on 81mg  Aspirin, hydroxyurea,and has received phlebotomies every 4 weeks (last 11/28/19).She presentsto the clinictoday for follow-up to review her labs. Her major complaint today is fatigue.  She is taking hydroxyurea 4 days a week.  ALLERGIES:  is allergic to penicillins.  MEDICATIONS:  Current Outpatient Medications  Medication Sig Dispense Refill  . aspirin EC 81 MG tablet Take 1 tablet (81 mg total) by mouth daily.    . hydroxyurea (HYDREA) 500 MG capsule TAKE 1 CAPSULE(500 MG) BY MOUTH DAILY. MAY TAKE WITH FOOD TO MINIMIZE GI SIDE EFFECTS 30 capsule 3  . varenicline (CHANTIX CONTINUING MONTH PAK) 1 MG tablet Take 1 tablet (1 mg total) by mouth 2 (two) times daily. 60 tablet 1  . varenicline (CHANTIX) 0.5 MG tablet Chantix 0.5 mg tablet  Take 1 tablet twice a day by oral route for 3 days.     No current facility-administered medications for this visit.    PHYSICAL EXAMINATION: ECOG PERFORMANCE STATUS: 1 - Symptomatic but completely ambulatory  There were no vitals filed for this visit. There were no vitals filed for this visit.  LABORATORY DATA:  I have reviewed the data as listed CMP Latest Ref Rng & Units 11/04/2019 11/04/2019 09/27/2019  Glucose 70 - 99 mg/dL 121(H) 99 92  BUN 8 - 23 mg/dL 20 16 13   Creatinine 0.44 - 1.00 mg/dL 0.58 0.72 0.68  Sodium 135 - 145 mmol/L 137 139 139  Potassium 3.5 - 5.1 mmol/L 4.5 4.4 4.6  Chloride 98 - 111 mmol/L 104 106 106  CO2 22 - 32 mmol/L 25 23 22   Calcium  8.9 - 10.3 mg/dL 9.0 9.9 9.7  Total Protein 6.5 - 8.1 g/dL - 7.5 7.1  Total Bilirubin 0.3 - 1.2 mg/dL - 0.3 0.3  Alkaline Phos 38 - 126 U/L - 65 72  AST 15 - 41 U/L - 24 18  ALT 0 - 44 U/L - 10 11    Lab Results  Component Value Date   WBC 8.1 01/27/2020   HGB 10.7 (L) 01/27/2020   HCT 34.4 (L) 01/27/2020   MCV 80.6 01/27/2020   PLT 350 01/27/2020   NEUTROABS 5.4 01/27/2020    ASSESSMENT & PLAN:  Hemochromatosis Genetic testing revealed homozygous C282Y mutation Current treatment:Previously underwent phlebotomy with the goal to keep ferritin less than 100 and saturation less than 50%,no phlebotomies done between August 2018toJanuary 2021. Received phlebotomy sinceJanuary 2021every 2 weeks  Lab review: 03/24/2017:Ferritin10, iron saturation10% with a hemoglobin of 13.4 07/12/2017: Ferritin 18, iron saturation 66% with a hemoglobin of 14.8 09/18/2017: Ferritin 20, iron saturation 38%, hemoglobin 15.1 05/09/2018: Ferritin: 35, iron saturation 68%, hemoglobin 15.6, platelets 899 (increased from 650) 12/18/2018: Ferritin 60, iron saturation 67%, hemoglobin 15, platelet count 993 05/02/2019: Ferritin 85, iron saturation 105%, hemoglobin 13.6, platelets 521 08/12/2019: Ferritin 21, iron saturation 56%, hemoglobin 14.1, platelets 484 09/27/2019: Ferritin 6, iron saturation 15%, hemoglobin 13.2, platelets 555 01/27/2020: Ferritin <4, Iron saturation: 4% Hb 10.7, platelets 350  Discussion:Iron saturation has come down by 15%.I recommended that we move to phlebotomies every  4 weeks  Essential thrombocytosis: Currently on Hydrea 500 mg changing to 3 times a week. Hydrea toxicities: Fatigue She is under a lot of stress taking care of her sister who has multiple medical issues.  Her mother is doing well.  We will see her back in11months with labs done ahead of time and follow-up.    No orders of the defined types were placed in this encounter.  The patient has a good  understanding of the overall plan. she agrees with it. she will call with any problems that may develop before the next visit here.  Total time spent: 30 mins including face to face time and time spent for planning, charting and coordination of care  Nicholas Lose, MD 01/30/2020  I, Cloyde Reams Dorshimer, am acting as scribe for Dr. Nicholas Lose.  I have reviewed the above documentation for accuracy and completeness, and I agree with the above.

## 2020-01-30 ENCOUNTER — Inpatient Hospital Stay: Payer: Medicare Other

## 2020-01-30 ENCOUNTER — Inpatient Hospital Stay (HOSPITAL_BASED_OUTPATIENT_CLINIC_OR_DEPARTMENT_OTHER): Payer: Medicare Other | Admitting: Hematology and Oncology

## 2020-01-30 ENCOUNTER — Other Ambulatory Visit: Payer: Self-pay

## 2020-01-30 VITALS — BP 151/79 | HR 79 | Temp 97.5°F | Resp 17 | Ht 61.0 in | Wt 110.8 lb

## 2020-01-30 DIAGNOSIS — Z79899 Other long term (current) drug therapy: Secondary | ICD-10-CM | POA: Diagnosis not present

## 2020-01-30 DIAGNOSIS — Z7982 Long term (current) use of aspirin: Secondary | ICD-10-CM | POA: Diagnosis not present

## 2020-01-30 DIAGNOSIS — D473 Essential (hemorrhagic) thrombocythemia: Secondary | ICD-10-CM

## 2020-01-30 DIAGNOSIS — R5383 Other fatigue: Secondary | ICD-10-CM | POA: Diagnosis not present

## 2020-01-30 MED ORDER — HYDROXYUREA 500 MG PO CAPS: 500.0000 mg | ORAL_CAPSULE | ORAL | 3 refills | Status: DC

## 2020-01-30 NOTE — Assessment & Plan Note (Signed)
Genetic testing revealed homozygous C282Y mutation Current treatment:Previously underwent phlebotomy with the goal to keep ferritin less than 100 and saturation less than 50%,no phlebotomies done between August 2018toJanuary 2021. Received phlebotomy sinceJanuary 2021every 2 weeks  Lab review: 03/24/2017:Ferritin10, iron saturation10% with a hemoglobin of 13.4 07/12/2017: Ferritin 18, iron saturation 66% with a hemoglobin of 14.8 09/18/2017: Ferritin 20, iron saturation 38%, hemoglobin 15.1 05/09/2018: Ferritin: 35, iron saturation 68%, hemoglobin 15.6, platelets 899 (increased from 650) 12/18/2018: Ferritin 60, iron saturation 67%, hemoglobin 15, platelet count 993 05/02/2019: Ferritin 85, iron saturation 105%, hemoglobin 13.6, platelets 521 08/12/2019: Ferritin 21, iron saturation 56%, hemoglobin 14.1, platelets 484 09/27/2019: Ferritin 6, iron saturation 15%, hemoglobin 13.2, platelets 555 01/27/2020: Ferritin <4, Iron saturation: 4% Hb 10.7, platelets 350  Discussion:Iron saturation has come down by 15%.I recommended that we move to phlebotomies every 4 weeks  Essential thrombocytosis: Currently on Hydrea 5 mg daily. Hydrea toxicities: Fatigue  We will see her back in91months with labs done ahead of time and follow-up.

## 2020-03-06 DIAGNOSIS — Z23 Encounter for immunization: Secondary | ICD-10-CM | POA: Diagnosis not present

## 2020-03-18 ENCOUNTER — Other Ambulatory Visit: Payer: Self-pay

## 2020-03-18 MED ORDER — HYDROXYUREA 500 MG PO CAPS: 500.0000 mg | ORAL_CAPSULE | ORAL | 3 refills | Status: DC

## 2020-03-22 ENCOUNTER — Other Ambulatory Visit: Payer: Self-pay | Admitting: Hematology and Oncology

## 2020-04-28 ENCOUNTER — Inpatient Hospital Stay: Payer: Medicare Other | Attending: Hematology and Oncology

## 2020-04-28 ENCOUNTER — Other Ambulatory Visit: Payer: Self-pay

## 2020-04-28 DIAGNOSIS — D473 Essential (hemorrhagic) thrombocythemia: Secondary | ICD-10-CM | POA: Insufficient documentation

## 2020-04-28 DIAGNOSIS — Z7982 Long term (current) use of aspirin: Secondary | ICD-10-CM | POA: Diagnosis not present

## 2020-04-28 DIAGNOSIS — Z79899 Other long term (current) drug therapy: Secondary | ICD-10-CM | POA: Insufficient documentation

## 2020-04-28 LAB — IRON AND TIBC
Iron: 60 ug/dL (ref 41–142)
Saturation Ratios: 18 % — ABNORMAL LOW (ref 21–57)
TIBC: 335 ug/dL (ref 236–444)
UIBC: 275 ug/dL (ref 120–384)

## 2020-04-28 LAB — CBC WITH DIFFERENTIAL (CANCER CENTER ONLY)
Abs Immature Granulocytes: 0.02 10*3/uL (ref 0.00–0.07)
Basophils Absolute: 0 10*3/uL (ref 0.0–0.1)
Basophils Relative: 1 %
Eosinophils Absolute: 0.2 10*3/uL (ref 0.0–0.5)
Eosinophils Relative: 2 %
HCT: 39.8 % (ref 36.0–46.0)
Hemoglobin: 12.5 g/dL (ref 12.0–15.0)
Immature Granulocytes: 0 %
Lymphocytes Relative: 26 %
Lymphs Abs: 2.3 10*3/uL (ref 0.7–4.0)
MCH: 27.2 pg (ref 26.0–34.0)
MCHC: 31.4 g/dL (ref 30.0–36.0)
MCV: 86.5 fL (ref 80.0–100.0)
Monocytes Absolute: 0.7 10*3/uL (ref 0.1–1.0)
Monocytes Relative: 8 %
Neutro Abs: 5.6 10*3/uL (ref 1.7–7.7)
Neutrophils Relative %: 63 %
Platelet Count: 594 10*3/uL — ABNORMAL HIGH (ref 150–400)
RBC: 4.6 MIL/uL (ref 3.87–5.11)
RDW: 20.9 % — ABNORMAL HIGH (ref 11.5–15.5)
WBC Count: 8.8 10*3/uL (ref 4.0–10.5)
nRBC: 0 % (ref 0.0–0.2)

## 2020-04-28 LAB — FERRITIN: Ferritin: 5 ng/mL — ABNORMAL LOW (ref 11–307)

## 2020-04-29 NOTE — Progress Notes (Signed)
MY CHART VIRTUAL VISIT  Patient Care Team: Patient, No Pcp Per as PCP - General (General Practice) Nicholas Lose, MD as Consulting Physician (Hematology and Oncology)  DIAGNOSIS:    ICD-10-CM   1. Essential thrombocytosis (HCC)  D47.3 CBC with Differential ([facility] Only)    Iron and TIBC    Ferritin    CHIEF COMPLIANT: Follow-up ofthrombocytosis and hereditary hemochromatosis  INTERVAL HISTORY: Kara [name] is a 70 y.o. with above-mentioned history of thrombocytosis and hereditary hemochromatosis who is currently on 81mg  Aspirin, hydroxyurea,and has received phlebotomies (last 11/28/19).Labs on 04/29/19 showed: Hg12.5, HCT 39.8, platelets 594, iron saturation 18%, ferritin 5. She presentsto the clinictoday for follow-up. Continues to be stressful with her sister.  ALLERGIES:  is allergic to penicillins.  MEDICATIONS:  Current Outpatient Medications  Medication Sig Dispense Refill   aspirin EC 81 MG tablet Take 1 tablet (81 mg total) by mouth daily.     hydroxyurea (HYDREA) 500 MG capsule TAKE ONE CAPSULE BY MOUTH DAILY, MAY TAKE WITH FOOD TO MINIMIZE GI SIDE EFFECTS 30 capsule 3   varenicline (CHANTIX CONTINUING MONTH PAK) 1 MG tablet Take 1 tablet (1 mg total) by mouth 2 (two) times daily. 60 tablet 1   varenicline (CHANTIX) 0.5 MG tablet Chantix 0.5 mg tablet  Take 1 tablet twice a day by oral route for 3 days.     No current facility-administered medications for this visit.    PHYSICAL EXAMINATION: ECOG PERFORMANCE STATUS: 1 - Symptomatic but completely ambulatory   LABORATORY DATA:  I have reviewed the data as listed CMP Latest Ref Rng & Units 11/04/2019 11/04/2019 09/27/2019  Glucose 70 - 99 mg/dL 121(H) 99 92  BUN 8 - 23 mg/dL 20 16 13   Creatinine 0.44 - 1.00 mg/dL 0.58 0.72 0.68  Sodium 135 - 145 mmol/L 137 139 139  Potassium 3.5 - 5.1 mmol/L 4.5 4.4 4.6  Chloride 98 - 111 mmol/L 104 106 106  CO2 22 - 32 mmol/L 25 23 22   Calcium 8.9 - 10.3 mg/dL 9.0 9.9  9.7  Total Protein 6.5 - 8.1 g/dL - 7.5 7.1  Total Bilirubin 0.3 - 1.2 mg/dL - 0.3 0.3  Alkaline Phos 38 - 126 U/L - 65 72  AST 15 - 41 U/L - 24 18  ALT 0 - 44 U/L - 10 11    Lab Results  Component Value Date   WBC 8.8 04/28/2020   HGB 12.5 04/28/2020   HCT 39.8 04/28/2020   MCV 86.5 04/28/2020   PLT 594 (H) 04/28/2020   NEUTROABS 5.6 04/28/2020    ASSESSMENT & PLAN:  Essential thrombocytosis ([place]) Genetic testing revealed homozygous C282Y mutation Current treatment:Previously underwent phlebotomy with the goal to keep ferritin less than 100 and saturation less than 50%,no phlebotomies done between August 2018toJanuary 2021. Received phlebotomy sinceJanuary 2021every 2 weeks  Lab review: 03/24/2017:Ferritin10, iron saturation10% with a hemoglobin of 13.4 07/12/2017: Ferritin 18, iron saturation 66% with a hemoglobin of 14.8 09/18/2017: Ferritin 20, iron saturation 38%, hemoglobin 15.1 05/09/2018: Ferritin: 35, iron saturation 68%, hemoglobin 15.6, platelets 899 (increased from 650) 12/18/2018: Ferritin 60, iron saturation 67%, hemoglobin 15, platelet count 993 05/02/2019: Ferritin 85, iron saturation 105%, hemoglobin 13.6, platelets 521 08/12/2019: Ferritin 21, iron saturation 56%, hemoglobin 14.1, platelets 484 09/27/2019: Ferritin 6, iron saturation 15%, hemoglobin 13.2, platelets 555 01/27/2020: Ferritin <4, Iron saturation: 4% Hb 10.7, platelets 350 04/28/2020: Hemoglobin 12.5, ferritin 5, iron saturation 18%, platelets 594  No phlebotomies planned at this time (last phlebotomy was  in August 2021)  Essential thrombocytosis: Currently on Hydrea 500 mg  3 times a week. Hydrea toxicities: Fatigue She is under a lot of stress taking care of her sister who has multiple medical issues.  Her mother is doing well.  We will see her back in58months with labs done ahead of time and follow-up.  Orders Placed This Encounter  Procedures   CBC with Differential ([facility]  Only)    Standing Status:   Future    Standing Expiration Date:   04/30/2021   Iron and TIBC    Standing Status:   Future    Standing Expiration Date:   04/30/2021   Ferritin    Standing Status:   Future    Standing Expiration Date:   04/30/2021   The patient has a good understanding of the overall plan. she agrees with it. she will call with any problems that may develop before the next visit here.  Total time spent: 20 mins including face to face time and time spent for planning, charting and coordination of care  Nicholas Lose, MD 04/30/2020  I, Cloyde Reams Dorshimer, am acting as scribe for Dr. Nicholas Lose.  I have reviewed the above documentation for accuracy and completeness, and I agree with the above.

## 2020-04-30 ENCOUNTER — Inpatient Hospital Stay (HOSPITAL_BASED_OUTPATIENT_CLINIC_OR_DEPARTMENT_OTHER): Payer: Medicare Other | Admitting: Hematology and Oncology

## 2020-04-30 DIAGNOSIS — D473 Essential (hemorrhagic) thrombocythemia: Secondary | ICD-10-CM | POA: Diagnosis not present

## 2020-04-30 NOTE — Assessment & Plan Note (Signed)
Genetic testing revealed homozygous C282Y mutation Current treatment:Previously underwent phlebotomy with the goal to keep ferritin less than 100 and saturation less than 50%,no phlebotomies done between August 2018toJanuary 2021. Received phlebotomy sinceJanuary 2021every 2 weeks  Lab review: 03/24/2017:Ferritin10, iron saturation10% with a hemoglobin of 13.4 07/12/2017: Ferritin 18, iron saturation 66% with a hemoglobin of 14.8 09/18/2017: Ferritin 20, iron saturation 38%, hemoglobin 15.1 05/09/2018: Ferritin: 35, iron saturation 68%, hemoglobin 15.6, platelets 899 (increased from 650) 12/18/2018: Ferritin 60, iron saturation 67%, hemoglobin 15, platelet count 993 05/02/2019: Ferritin 85, iron saturation 105%, hemoglobin 13.6, platelets 521 08/12/2019: Ferritin 21, iron saturation 56%, hemoglobin 14.1, platelets 484 09/27/2019: Ferritin 6, iron saturation 15%, hemoglobin 13.2, platelets 555 01/27/2020: Ferritin <4, Iron saturation: 4% Hb 10.7, platelets 350 04/28/2020: Hemoglobin 12.5, ferritin 5, iron saturation 18%, platelets 594  No phlebotomies planned at this time (last phlebotomy was in August 2021)  Essential thrombocytosis: Currently on Hydrea 500 mg  3 times a week. Hydrea toxicities: Fatigue She is under a lot of stress taking care of her sister who has multiple medical issues.  Her mother is doing well.  We will see her back in51months with labs done ahead of time and follow-up.

## 2020-05-20 ENCOUNTER — Telehealth: Payer: Self-pay | Admitting: Hematology and Oncology

## 2020-05-20 NOTE — Telephone Encounter (Signed)
Informed patient of her upcoming appointments. Patient is aware. 

## 2020-07-29 ENCOUNTER — Other Ambulatory Visit: Payer: Medicare Other

## 2020-07-31 ENCOUNTER — Ambulatory Visit: Payer: Medicare Other | Admitting: Hematology and Oncology

## 2020-08-04 DIAGNOSIS — M9905 Segmental and somatic dysfunction of pelvic region: Secondary | ICD-10-CM | POA: Diagnosis not present

## 2020-08-04 DIAGNOSIS — M5136 Other intervertebral disc degeneration, lumbar region: Secondary | ICD-10-CM | POA: Diagnosis not present

## 2020-08-04 DIAGNOSIS — M9904 Segmental and somatic dysfunction of sacral region: Secondary | ICD-10-CM | POA: Diagnosis not present

## 2020-08-04 DIAGNOSIS — M9903 Segmental and somatic dysfunction of lumbar region: Secondary | ICD-10-CM | POA: Diagnosis not present

## 2020-08-10 ENCOUNTER — Other Ambulatory Visit: Payer: Self-pay

## 2020-08-10 ENCOUNTER — Inpatient Hospital Stay: Payer: Medicare Other | Attending: Hematology and Oncology

## 2020-08-10 DIAGNOSIS — D473 Essential (hemorrhagic) thrombocythemia: Secondary | ICD-10-CM | POA: Diagnosis not present

## 2020-08-10 LAB — CBC WITH DIFFERENTIAL (CANCER CENTER ONLY)
Abs Immature Granulocytes: 0.02 10*3/uL (ref 0.00–0.07)
Basophils Absolute: 0 10*3/uL (ref 0.0–0.1)
Basophils Relative: 0 %
Eosinophils Absolute: 0.2 10*3/uL (ref 0.0–0.5)
Eosinophils Relative: 4 %
HCT: 41.3 % (ref 36.0–46.0)
Hemoglobin: 13.3 g/dL (ref 12.0–15.0)
Immature Granulocytes: 0 %
Lymphocytes Relative: 28 %
Lymphs Abs: 1.8 10*3/uL (ref 0.7–4.0)
MCH: 30.2 pg (ref 26.0–34.0)
MCHC: 32.2 g/dL (ref 30.0–36.0)
MCV: 93.7 fL (ref 80.0–100.0)
Monocytes Absolute: 0.6 10*3/uL (ref 0.1–1.0)
Monocytes Relative: 10 %
Neutro Abs: 3.7 10*3/uL (ref 1.7–7.7)
Neutrophils Relative %: 58 %
Platelet Count: 698 10*3/uL — ABNORMAL HIGH (ref 150–400)
RBC: 4.41 MIL/uL (ref 3.87–5.11)
RDW: 17.4 % — ABNORMAL HIGH (ref 11.5–15.5)
WBC Count: 6.4 10*3/uL (ref 4.0–10.5)
nRBC: 0 % (ref 0.0–0.2)

## 2020-08-10 LAB — IRON AND TIBC
Iron: 121 ug/dL (ref 41–142)
Saturation Ratios: 49 % (ref 21–57)
TIBC: 249 ug/dL (ref 236–444)
UIBC: 128 ug/dL (ref 120–384)

## 2020-08-10 LAB — FERRITIN: Ferritin: 27 ng/mL (ref 11–307)

## 2020-08-11 NOTE — Assessment & Plan Note (Signed)
Genetic testing revealed homozygous C282Y mutation Current treatment:Previously underwent phlebotomy with the goal to keep ferritin less than 100 and saturation less than 50%,no phlebotomies done between August 2018toJanuary 2021. Received phlebotomy sinceJanuary 2021every 2 weeks  Lab review: 03/24/2017:Ferritin10, iron saturation10% with a hemoglobin of 13.4 07/12/2017: Ferritin 18, iron saturation 66% with a hemoglobin of 14.8 09/18/2017: Ferritin 20, iron saturation 38%, hemoglobin 15.1 05/09/2018: Ferritin: 35, iron saturation 68%, hemoglobin 15.6, platelets 899 (increased from 650) 12/18/2018: Ferritin 60, iron saturation 67%, hemoglobin 15, platelet count 993 05/02/2019: Ferritin 85, iron saturation 105%, hemoglobin 13.6, platelets 521 08/12/2019: Ferritin 21, iron saturation 56%, hemoglobin 14.1, platelets 484 09/27/2019: Ferritin 6, iron saturation 15%, hemoglobin 13.2, platelets 555 01/27/2020: Ferritin <4, Iron saturation: 4% Hb 10.7, platelets 350 04/28/2020: Hemoglobin 12.5, ferritin 5, iron saturation 18%, platelets 594  No phlebotomies planned at this time (last phlebotomy was in August 2021)  Essential thrombocytosis: Currently on Hydrea 500mg   3 times a week. Hydrea toxicities: Fatigue She is under a lot of stress taking care of her sister who has multiple medical issues. Her mother is doing well.

## 2020-08-11 NOTE — Progress Notes (Signed)
TELEPHONE VISIT  Patient Care Team: Patient, No Pcp Per (Inactive) as PCP - General (General Practice) Nicholas Lose, MD as Consulting Physician (Hematology and Oncology)  DIAGNOSIS:    ICD-10-CM   1. Essential thrombocytosis (HCC)  D47.3   2. Hereditary hemochromatosis ([place])  E83.110     CHIEF COMPLIANT: Follow-up ofthrombocytosis and hereditary hemochromatosis  INTERVAL HISTORY: Kara [name] is a 70 y.o. with above-mentioned history of thrombocytosis and hereditary hemochromatosis who is currently on 81mg  Aspirin, hydroxyurea,and has received phlebotomies(last 11/28/19).Labs on 08/10/20 showed: Hg 13.3, HCT 41.3, platelets 698, iron saturation 49%, ferritin 27. She presentsto the clinictoday for follow-up. She had back problems and therefore she could not come in to be seen today. She tells me that her mother is doing much better.  ALLERGIES:  is allergic to penicillins.  MEDICATIONS:  Current Outpatient Medications  Medication Sig Dispense Refill  . aspirin EC 81 MG tablet Take 1 tablet (81 mg total) by mouth daily.    . hydroxyurea (HYDREA) 500 MG capsule TAKE ONE CAPSULE BY MOUTH DAILY, MAY TAKE WITH FOOD TO MINIMIZE GI SIDE EFFECTS 30 capsule 3  . varenicline (CHANTIX CONTINUING MONTH PAK) 1 MG tablet Take 1 tablet (1 mg total) by mouth 2 (two) times daily. 60 tablet 1  . varenicline (CHANTIX) 0.5 MG tablet Chantix 0.5 mg tablet  Take 1 tablet twice a day by oral route for 3 days.     No current facility-administered medications for this visit.    PHYSICAL EXAMINATION: ECOG PERFORMANCE STATUS: 1 - Symptomatic but completely ambulatory  There were no vitals filed for this visit. There were no vitals filed for this visit.   LABORATORY DATA:  I have reviewed the data as listed CMP Latest Ref Rng & Units 11/04/2019 11/04/2019 09/27/2019  Glucose 70 - 99 mg/dL 121(H) 99 92  BUN 8 - 23 mg/dL 20 16 13   Creatinine 0.44 - 1.00 mg/dL 0.58 0.72 0.68  Sodium 135 - 145  mmol/L 137 139 139  Potassium 3.5 - 5.1 mmol/L 4.5 4.4 4.6  Chloride 98 - 111 mmol/L 104 106 106  CO2 22 - 32 mmol/L 25 23 22   Calcium 8.9 - 10.3 mg/dL 9.0 9.9 9.7  Total Protein 6.5 - 8.1 g/dL - 7.5 7.1  Total Bilirubin 0.3 - 1.2 mg/dL - 0.3 0.3  Alkaline Phos 38 - 126 U/L - 65 72  AST 15 - 41 U/L - 24 18  ALT 0 - 44 U/L - 10 11    Lab Results  Component Value Date   WBC 6.4 08/10/2020   HGB 13.3 08/10/2020   HCT 41.3 08/10/2020   MCV 93.7 08/10/2020   PLT 698 (H) 08/10/2020   NEUTROABS 3.7 08/10/2020    ASSESSMENT & PLAN:  Hemochromatosis Genetic testing revealed homozygous C282Y mutation Current treatment:Previously underwent phlebotomy with the goal to keep ferritin less than 100 and saturation less than 50%,no phlebotomies done between August 2018toJanuary 2021. Received phlebotomy sinceJanuary 2021every 2 weeks  Lab review: 03/24/2017:Ferritin10, iron saturation10% with a hemoglobin of 13.4 07/12/2017: Ferritin 18, iron saturation 66% with a hemoglobin of 14.8 09/18/2017: Ferritin 20, iron saturation 38%, hemoglobin 15.1 05/09/2018: Ferritin: 35, iron saturation 68%, hemoglobin 15.6, platelets 899 (increased from 650) 12/18/2018: Ferritin 60, iron saturation 67%, hemoglobin 15, platelet count 993 05/02/2019: Ferritin 85, iron saturation 105%, hemoglobin 13.6, platelets 521 08/12/2019: Ferritin 21, iron saturation 56%, hemoglobin 14.1, platelets 484 09/27/2019: Ferritin 6, iron saturation 15%, hemoglobin 13.2, platelets 555 01/27/2020: Ferritin <4, Iron saturation:  4% Hb 10.7, platelets 350 04/28/2020: Hemoglobin 12.5, ferritin 5, iron saturation 18%, platelets 594 08/10/2020: Hemoglobin 13.3, platelets 698, ferritin 27, iron saturation 49%  No phlebotomies planned at this time (last phlebotomy was in August 2021)  Essential thrombocytosis: Currently on Hydrea 500mg    increased to 4 times a week Hydrea toxicities: Fatigue She is under a lot of stress taking care of  her sister who has multiple medical issues. Her mother is doing well.  We decided to hold off on doing phlebotomy at this time.  When she comes back in 2 months we will reassess that.  We will request an appointment for phlebotomy at that time.  She tells me that she has not been eating properly and she will try to do that over the next 2 months and see if it makes a difference to her iron labs.  No orders of the defined types were placed in this encounter.  The patient has a good understanding of the overall plan. she agrees with it. she will call with any problems that may develop before the next visit here.  Total time spent: 12 mins including face to face time and time spent for planning, charting and coordination of care  Rulon Eisenmenger, MD, MPH 08/12/2020  I, Molly Dorshimer, am acting as scribe for Dr. Nicholas Lose.  I have reviewed the above documentation for accuracy and completeness, and I agree with the above.

## 2020-08-12 ENCOUNTER — Encounter: Payer: Self-pay | Admitting: *Deleted

## 2020-08-12 ENCOUNTER — Encounter: Payer: Self-pay | Admitting: Hematology and Oncology

## 2020-08-12 ENCOUNTER — Inpatient Hospital Stay (HOSPITAL_BASED_OUTPATIENT_CLINIC_OR_DEPARTMENT_OTHER): Payer: Medicare Other | Admitting: Hematology and Oncology

## 2020-08-12 DIAGNOSIS — D473 Essential (hemorrhagic) thrombocythemia: Secondary | ICD-10-CM | POA: Diagnosis not present

## 2020-08-12 MED ORDER — HYDROXYUREA 500 MG PO CAPS: ORAL_CAPSULE | ORAL | 3 refills | Status: DC

## 2020-08-18 ENCOUNTER — Telehealth: Payer: Self-pay | Admitting: Hematology and Oncology

## 2020-08-18 DIAGNOSIS — M9903 Segmental and somatic dysfunction of lumbar region: Secondary | ICD-10-CM | POA: Diagnosis not present

## 2020-08-18 DIAGNOSIS — M9905 Segmental and somatic dysfunction of pelvic region: Secondary | ICD-10-CM | POA: Diagnosis not present

## 2020-08-18 DIAGNOSIS — M9904 Segmental and somatic dysfunction of sacral region: Secondary | ICD-10-CM | POA: Diagnosis not present

## 2020-08-18 DIAGNOSIS — M5136 Other intervertebral disc degeneration, lumbar region: Secondary | ICD-10-CM | POA: Diagnosis not present

## 2020-08-18 NOTE — Telephone Encounter (Signed)
Sch per 04/27 los, Patient aware

## 2020-09-01 DIAGNOSIS — M9904 Segmental and somatic dysfunction of sacral region: Secondary | ICD-10-CM | POA: Diagnosis not present

## 2020-09-01 DIAGNOSIS — M9905 Segmental and somatic dysfunction of pelvic region: Secondary | ICD-10-CM | POA: Diagnosis not present

## 2020-09-01 DIAGNOSIS — M5136 Other intervertebral disc degeneration, lumbar region: Secondary | ICD-10-CM | POA: Diagnosis not present

## 2020-09-01 DIAGNOSIS — M9903 Segmental and somatic dysfunction of lumbar region: Secondary | ICD-10-CM | POA: Diagnosis not present

## 2020-09-07 DIAGNOSIS — R457 State of emotional shock and stress, unspecified: Secondary | ICD-10-CM | POA: Diagnosis not present

## 2020-09-07 DIAGNOSIS — R110 Nausea: Secondary | ICD-10-CM | POA: Diagnosis not present

## 2020-09-07 DIAGNOSIS — R0602 Shortness of breath: Secondary | ICD-10-CM | POA: Diagnosis not present

## 2020-09-07 DIAGNOSIS — R001 Bradycardia, unspecified: Secondary | ICD-10-CM | POA: Diagnosis not present

## 2020-09-08 ENCOUNTER — Other Ambulatory Visit: Payer: Self-pay

## 2020-09-08 ENCOUNTER — Ambulatory Visit (HOSPITAL_COMMUNITY)
Admission: EM | Admit: 2020-09-08 | Discharge: 2020-09-08 | Disposition: A | Discharge status: Home / Self Care | Payer: Medicare Other | Attending: Internal Medicine | Admitting: Internal Medicine

## 2020-09-08 ENCOUNTER — Telehealth: Payer: Self-pay | Admitting: *Deleted

## 2020-09-08 ENCOUNTER — Encounter (HOSPITAL_COMMUNITY): Payer: Self-pay | Admitting: Emergency Medicine

## 2020-09-08 ENCOUNTER — Other Ambulatory Visit: Payer: Self-pay | Admitting: *Deleted

## 2020-09-08 DIAGNOSIS — R1011 Right upper quadrant pain: Secondary | ICD-10-CM

## 2020-09-08 DIAGNOSIS — R109 Unspecified abdominal pain: Secondary | ICD-10-CM

## 2020-09-08 DIAGNOSIS — R0781 Pleurodynia: Secondary | ICD-10-CM | POA: Diagnosis not present

## 2020-09-08 DIAGNOSIS — D473 Essential (hemorrhagic) thrombocythemia: Secondary | ICD-10-CM

## 2020-09-08 LAB — POCT URINALYSIS DIPSTICK, ED / UC
Bilirubin Urine: NEGATIVE
Glucose, UA: NEGATIVE mg/dL
Hgb urine dipstick: NEGATIVE
Ketones, ur: NEGATIVE mg/dL
Leukocytes,Ua: NEGATIVE
Nitrite: NEGATIVE
Protein, ur: NEGATIVE mg/dL
Specific Gravity, Urine: 1.015 (ref 1.005–1.030)
Urobilinogen, UA: 0.2 mg/dL (ref 0.0–1.0)
pH: 6 (ref 5.0–8.0)

## 2020-09-08 MED ORDER — IBUPROFEN 800 MG PO TABS: 800.0000 mg | ORAL_TABLET | Freq: Two times a day (BID) | ORAL | 0 refills | Status: DC | PRN

## 2020-09-08 NOTE — ED Provider Notes (Signed)
[place]    CSN: [id number] Arrival date & time: 09/08/20  6948      History   Chief Complaint Chief Complaint  Patient presents with  . Arm Pain    Right  . Shortness of Breath    HPI Kara [name] is a 70 y.o. female.   Patient presenting today with sudden onset right flank pain and right arm pain that began yesterday evening.  She states it was associated with nausea and extreme fatigue yesterday.  She called EMS soon after onset worried about a heart attack, EKG was performed and she was told that the wait time at the hospital was significant and that she should go to urgent care in the morning for further evaluation.  She states she went directly to sleep after this that she was very tired and hopes to wake up feeling better but does not feel any better.  Deep breathing, hiccups, belching seem to exacerbate the pain.  Denies palpitations, chest pain, shortness of breath, dizziness, abdominal pain, nausea vomiting or diarrhea, urinary symptoms.  Has never had anything happen to her like this in the past.  Does not know of any sudden movements or exertional activities that could have created any muscular pain and stated does not feel like a muscular pain to her.  Past medical history significant for hemochromatosis, which she states she is currently on Plaquenil through her hematologist because she has been having high platelet counts lately.  Social history significant for past history of heavy alcohol use, currently sober and daily cigarette smoking.  Because of this, she is concerned also about a blood clot.  States so far all she is taken is several baby aspirin which did not help last night.  She was scared to take anything else because she did not want to mask the symptoms.     Past Medical History:  Diagnosis Date  . Hemochromatosis   . Migraine     Patient Active Problem List   Diagnosis Date Noted  . Essential thrombocytosis ([place]) 05/22/2018  . Chronic  migraine 11/21/2017  . Hemochromatosis 06/01/2016  . Benign paroxysmal positional vertigo 05/20/2016  . Orthostatic hypotension 05/20/2016  . Nasal obstruction 05/19/2016  . Light headedness 04/28/2016  . Screening for hyperlipidemia 04/28/2016    Past Surgical History:  Procedure Laterality Date  . NASAL SEPTUM SURGERY      OB History   No obstetric history on file.      Home Medications    Prior to Admission medications   Medication Sig Start Date End Date Taking? Authorizing Provider  ibuprofen (ADVIL) 800 MG tablet Take 1 tablet (800 mg total) by mouth 2 (two) times daily as needed. 09/08/20  Yes Volney American, PA-C  aspirin EC 81 MG tablet Take 1 tablet (81 mg total) by mouth daily. 12/20/18   Nicholas Lose, MD  hydroxyurea (HYDREA) 500 MG capsule TAKE ONE CAPSULE BY MOUTH DAILY, MAY TAKE WITH FOOD TO MINIMIZE GI SIDE EFFECTS 08/12/20   Nicholas Lose, MD    Family History Family History  Problem Relation Age of Onset  . Healthy Mother   . Acute myelogenous leukemia Father   . Bipolar disorder Sister   . Diabetes Neg Hx   . Cancer Neg Hx   . Heart failure Neg Hx   . Hyperlipidemia Neg Hx   . Hypertension Neg Hx     Social History Social History   Tobacco Use  . Smoking status: Current Every Day Smoker  Packs/day: 1.00  . Smokeless tobacco: Never Used  Substance Use Topics  . Alcohol use: No    Comment: former heavy drinker now sober  . Drug use: No     Allergies   Penicillins   Review of Systems Review of Systems Per HPI  Physical Exam Triage Vital Signs ED Triage Vitals  Enc Vitals Group     BP 09/08/20 0835 113/68     Pulse Rate 09/08/20 0835 80     Resp 09/08/20 0835 18     Temp 09/08/20 0835 98.1 F (36.7 C)     Temp Source 09/08/20 0835 Oral     SpO2 09/08/20 0835 97 %     Weight --      Height --      Head Circumference --      Peak Flow --      Pain Score 09/08/20 0833 6     Pain Loc --      Pain Edu? --      Excl. in  [place]? --    No data found.  Updated Vital Signs BP 113/68 (BP Location: Left Arm)   Pulse 80   Temp 98.1 F (36.7 C) (Oral)   Resp 18   SpO2 97%   Visual Acuity Right Eye Distance:   Left Eye Distance:   Bilateral Distance:    Right Eye Near:   Left Eye Near:    Bilateral Near:     Physical Exam Vitals and nursing note reviewed.  Constitutional:      Appearance: Normal appearance. She is not ill-appearing.  HENT:     Head: Atraumatic.     Mouth/Throat:     Mouth: Mucous membranes are moist.     Pharynx: Oropharynx is clear.  Eyes:     Extraocular Movements: Extraocular movements intact.     Conjunctiva/sclera: Conjunctivae normal.  Cardiovascular:     Rate and Rhythm: Normal rate and regular rhythm.     Heart sounds: Normal heart sounds.  Pulmonary:     Effort: Pulmonary effort is normal. No respiratory distress.     Breath sounds: Normal breath sounds. No wheezing or rales.  Abdominal:     General: Bowel sounds are normal. There is no distension.     Palpations: Abdomen is soft.     Tenderness: There is no abdominal tenderness. There is no guarding.  Musculoskeletal:        General: Tenderness present. No swelling, deformity or signs of injury. Normal range of motion.     Cervical back: Normal range of motion and neck supple.     Comments: Tenderness to deep palpation right flank, made worse with deep breathing  Skin:    General: Skin is warm and dry.     Findings: No bruising or erythema.  Neurological:     General: No focal deficit present.     Mental Status: She is alert and oriented to person, place, and time.     Motor: No weakness.     Gait: Gait normal.  Psychiatric:        Mood and Affect: Mood normal.        Thought Content: Thought content normal.        Judgment: Judgment normal.    UC Treatments / Results  Labs (all labs ordered are listed, but only abnormal results are displayed) Labs Reviewed  POCT URINALYSIS DIPSTICK, ED / UC     EKG   Radiology No results found.  Procedures Procedures (  including critical care time)  Medications Ordered in UC Medications - No data to display  Initial Impression / Assessment and Plan / UC Course  I have reviewed the triage vital signs and the nursing notes.  Pertinent labs & imaging results that were available during my care of the patient were reviewed by me and considered in my medical decision making (see chart for details).     Vital signs all benign and reassuring, very well-appearing on exam.  No obvious abnormalities on exam and EKG today without focal ST or T wave changes, arrhythmias.  Normal sinus rhythm at 69 bpm today.  Discussed possible chest x-ray for further evaluation with patient but this was deferred by patient as she is not having trouble breathing and her oxygen saturation is 97% today.  UA performed to rule out other causes such as UTI or kidney stone given her pain in the flank area, this was benign and unrevealing of possible causes.  Did discuss with her the importance of further evaluation given the concerning nature of her symptoms and her medical history, she states that she does not wish to go to the ER at this time but may go to a med center later today if the symptoms do not improve or her hematologist recommends that she do so.  Discussed the risks of waiting in case something emergent is causing her symptoms, she is aware of risks.  She is hemodynamically stable for discharge at this time with strict ED precautions for worsening symptoms.  Ibuprofen was given for pain and inflammation.  Final Clinical Impressions(s) / UC Diagnoses   Final diagnoses:  Right flank pain  Pleuritic chest pain   Discharge Instructions   None    ED Prescriptions    Medication Sig Dispense Auth. Provider   ibuprofen (ADVIL) 800 MG tablet Take 1 tablet (800 mg total) by mouth 2 (two) times daily as needed. 21 tablet Volney American, [place]     PDMP not  reviewed this encounter.   Volney American, [place] 09/08/20 1140

## 2020-09-08 NOTE — ED Triage Notes (Signed)
Pt presents with right side arm pain and right side pain with deep breaths. States this started yesterday evening. Called EMS and was evaluated but told wait at hospital was long enough that pt would be sent to lobby. Was told by EMS to be checked out in the morning.

## 2020-09-08 NOTE — Telephone Encounter (Signed)
Received call from pt with complaint of right upper quadrant abdominal pain.  Pt states pain is worse when pressing on the right side and when taking a deep breath. Per MD pt needing abd [place] to r/o gallstones or appendicitis.  Orders placed.

## 2020-09-09 ENCOUNTER — Telehealth: Payer: Self-pay | Admitting: Hematology and Oncology

## 2020-09-09 ENCOUNTER — Ambulatory Visit (HOSPITAL_COMMUNITY)
Admission: RE | Admit: 2020-09-09 | Discharge: 2020-09-09 | Disposition: A | Discharge status: Home / Self Care | Payer: Medicare Other | Source: Ambulatory Visit | Attending: Hematology and Oncology | Admitting: Hematology and Oncology

## 2020-09-09 DIAGNOSIS — R109 Unspecified abdominal pain: Secondary | ICD-10-CM | POA: Diagnosis not present

## 2020-09-09 DIAGNOSIS — D473 Essential (hemorrhagic) thrombocythemia: Secondary | ICD-10-CM | POA: Insufficient documentation

## 2020-09-09 DIAGNOSIS — R1011 Right upper quadrant pain: Secondary | ICD-10-CM | POA: Diagnosis not present

## 2020-09-09 NOTE — Telephone Encounter (Signed)
I called the patient to discuss the results of the abdominal ultrasound.  We performed abdominal ultrasound to evaluate the right flank pain. The ultrasound did not show any evidence of gallbladder issues and kidney problems or liver pancreas spleen etc. were normal. Her pain is also improved suddenly and therefore we decided to not perform any further investigations.

## 2020-09-14 ENCOUNTER — Encounter: Payer: Self-pay | Admitting: *Deleted

## 2020-09-15 DIAGNOSIS — M9903 Segmental and somatic dysfunction of lumbar region: Secondary | ICD-10-CM | POA: Diagnosis not present

## 2020-09-15 DIAGNOSIS — M5136 Other intervertebral disc degeneration, lumbar region: Secondary | ICD-10-CM | POA: Diagnosis not present

## 2020-09-15 DIAGNOSIS — M9904 Segmental and somatic dysfunction of sacral region: Secondary | ICD-10-CM | POA: Diagnosis not present

## 2020-09-15 DIAGNOSIS — M9905 Segmental and somatic dysfunction of pelvic region: Secondary | ICD-10-CM | POA: Diagnosis not present

## 2020-09-29 DIAGNOSIS — M9903 Segmental and somatic dysfunction of lumbar region: Secondary | ICD-10-CM | POA: Diagnosis not present

## 2020-09-29 DIAGNOSIS — M9905 Segmental and somatic dysfunction of pelvic region: Secondary | ICD-10-CM | POA: Diagnosis not present

## 2020-09-29 DIAGNOSIS — M5136 Other intervertebral disc degeneration, lumbar region: Secondary | ICD-10-CM | POA: Diagnosis not present

## 2020-09-29 DIAGNOSIS — M9904 Segmental and somatic dysfunction of sacral region: Secondary | ICD-10-CM | POA: Diagnosis not present

## 2020-10-12 DIAGNOSIS — J310 Chronic rhinitis: Secondary | ICD-10-CM | POA: Diagnosis not present

## 2020-10-12 DIAGNOSIS — J342 Deviated nasal septum: Secondary | ICD-10-CM | POA: Diagnosis not present

## 2020-10-12 DIAGNOSIS — J343 Hypertrophy of nasal turbinates: Secondary | ICD-10-CM | POA: Diagnosis not present

## 2020-10-12 DIAGNOSIS — J3489 Other specified disorders of nose and nasal sinuses: Secondary | ICD-10-CM | POA: Diagnosis not present

## 2020-10-13 ENCOUNTER — Inpatient Hospital Stay: Payer: Medicare Other | Attending: Hematology and Oncology

## 2020-10-13 ENCOUNTER — Other Ambulatory Visit: Payer: Self-pay

## 2020-10-13 DIAGNOSIS — Z79899 Other long term (current) drug therapy: Secondary | ICD-10-CM | POA: Insufficient documentation

## 2020-10-13 DIAGNOSIS — Z7982 Long term (current) use of aspirin: Secondary | ICD-10-CM | POA: Insufficient documentation

## 2020-10-13 DIAGNOSIS — D473 Essential (hemorrhagic) thrombocythemia: Secondary | ICD-10-CM | POA: Insufficient documentation

## 2020-10-13 LAB — CMP (CANCER CENTER ONLY)
ALT: 9 U/L (ref 0–44)
AST: 18 U/L (ref 15–41)
Albumin: 4.1 g/dL (ref 3.5–5.0)
Alkaline Phosphatase: 72 U/L (ref 38–126)
Anion gap: 9 (ref 5–15)
BUN: 15 mg/dL (ref 8–23)
CO2: 22 mmol/L (ref 22–32)
Calcium: 9.3 mg/dL (ref 8.9–10.3)
Chloride: 110 mmol/L (ref 98–111)
Creatinine: 0.63 mg/dL (ref 0.44–1.00)
GFR, Estimated: >60 mL/min (ref >60)
Glucose, Bld: 92 mg/dL (ref 70–99)
Potassium: 4.4 mmol/L (ref 3.5–5.1)
Sodium: 141 mmol/L (ref 135–145)
Total Bilirubin: 0.3 mg/dL (ref 0.3–1.2)
Total Protein: 6.6 g/dL (ref 6.5–8.1)

## 2020-10-13 LAB — CBC WITH DIFFERENTIAL (CANCER CENTER ONLY)
Abs Immature Granulocytes: 0.01 10*3/uL (ref 0.00–0.07)
Basophils Absolute: 0 10*3/uL (ref 0.0–0.1)
Basophils Relative: 1 %
Eosinophils Absolute: 0.2 10*3/uL (ref 0.0–0.5)
Eosinophils Relative: 3 %
HCT: 39.9 % (ref 36.0–46.0)
Hemoglobin: 13.2 g/dL (ref 12.0–15.0)
Immature Granulocytes: 0 %
Lymphocytes Relative: 26 %
Lymphs Abs: 1.9 10*3/uL (ref 0.7–4.0)
MCH: 32.3 pg (ref 26.0–34.0)
MCHC: 33.1 g/dL (ref 30.0–36.0)
MCV: 97.6 fL (ref 80.0–100.0)
Monocytes Absolute: 0.6 10*3/uL (ref 0.1–1.0)
Monocytes Relative: 9 %
Neutro Abs: 4.4 10*3/uL (ref 1.7–7.7)
Neutrophils Relative %: 61 %
Platelet Count: 580 10*3/uL — ABNORMAL HIGH (ref 150–400)
RBC: 4.09 MIL/uL (ref 3.87–5.11)
RDW: 15.3 % (ref 11.5–15.5)
WBC Count: 7.1 10*3/uL (ref 4.0–10.5)
nRBC: 0 % (ref 0.0–0.2)

## 2020-10-13 LAB — FERRITIN: Ferritin: 42 ng/mL (ref 11–307)

## 2020-10-13 LAB — IRON AND TIBC
Iron: 165 ug/dL — ABNORMAL HIGH (ref 41–142)
Saturation Ratios: 76 % — ABNORMAL HIGH (ref 21–57)
TIBC: 218 ug/dL — ABNORMAL LOW (ref 236–444)
UIBC: 52 ug/dL — ABNORMAL LOW (ref 120–384)

## 2020-10-14 NOTE — Progress Notes (Signed)
Patient Care Team: Patient, No Pcp Per (Inactive) as PCP - General (General Practice) Nicholas Lose, MD as Consulting Physician (Hematology and Oncology)  DIAGNOSIS:    ICD-10-CM   1. Essential thrombocytosis (HCC)  D47.3 CBC with Differential (Cancer Center Only)    Ferritin    Iron and TIBC    CMP ([facility] only)    2. Hereditary hemochromatosis ([place])  E83.110 CBC with Differential (Cancer Center Only)    Ferritin    Iron and TIBC    CMP ([facility] only)        CHIEF COMPLIANT: Follow-up of thrombocytosis and hereditary hemochromatosis  INTERVAL HISTORY: Kara [name] is a 70 y.o. with above-mentioned history of thrombocytosis and hereditary hemochromatosis who is currently on 81mg  Aspirin, hydroxyurea, and has received phlebotomies (last 11/28/19). Labs 06/28 showed Plt 580K, Hg 13.2, HCT 39.9%, Iron Sat 76% , Ferritin 42.  She has not been watching her diet lately and because of that she has gained a lot of weight as well.  She attributes increasing the iron saturation to her diet.  Therefore we would like to hold off on phlebotomy today and see if she can control her diet and manage the iron levels.  ALLERGIES:  is allergic to penicillins.  MEDICATIONS:  Current Outpatient Medications  Medication Sig Dispense Refill   aspirin EC 81 MG tablet Take 1 tablet (81 mg total) by mouth daily.     hydroxyurea (HYDREA) 500 MG capsule TAKE ONE CAPSULE BY MOUTH DAILY, MAY TAKE WITH FOOD TO MINIMIZE GI SIDE EFFECTS 30 capsule 3   ibuprofen (ADVIL) 800 MG tablet Take 1 tablet (800 mg total) by mouth 2 (two) times daily as needed. 21 tablet 0   No current facility-administered medications for this visit.    PHYSICAL EXAMINATION: ECOG PERFORMANCE STATUS: 1 - Symptomatic but completely ambulatory  Vitals:   10/15/20 0839  BP: 121/63  Pulse: 65  Resp: 17  Temp: 97.6 F (36.4 C)  SpO2: 98%   Filed Weights   10/15/20 0839  Weight: 129 lb 14.4 oz (58.9 kg)    LABORATORY  DATA:  I have reviewed the data as listed CMP Latest Ref Rng & Units 10/13/2020 11/04/2019 11/04/2019  Glucose 70 - 99 mg/dL 92 121(H) 99  BUN 8 - 23 mg/dL 15 20 16   Creatinine 0.44 - 1.00 mg/dL 0.63 0.58 0.72  Sodium 135 - 145 mmol/L 141 137 139  Potassium 3.5 - 5.1 mmol/L 4.4 4.5 4.4  Chloride 98 - 111 mmol/L 110 104 106  CO2 22 - 32 mmol/L 22 25 23   Calcium 8.9 - 10.3 mg/dL 9.3 9.0 9.9  Total Protein 6.5 - 8.1 g/dL 6.6 - 7.5  Total Bilirubin 0.3 - 1.2 mg/dL 0.3 - 0.3  Alkaline Phos 38 - 126 U/L 72 - 65  AST 15 - 41 U/L 18 - 24  ALT 0 - 44 U/L 9 - 10    Lab Results  Component Value Date   WBC 7.1 10/13/2020   HGB 13.2 10/13/2020   HCT 39.9 10/13/2020   MCV 97.6 10/13/2020   PLT 580 (H) 10/13/2020   NEUTROABS 4.4 10/13/2020    ASSESSMENT & PLAN:  Hemochromatosis Genetic testing revealed homozygous C282Y mutation Current treatment: Previously underwent phlebotomy with the goal to keep ferritin less than 100 and saturation less than 50%, no phlebotomies done between August 2018 to January 2021.  Received phlebotomy since January 2021 every 2 weeks   Lab review:  03/24/2017:Ferritin 10, iron saturation  10% with a hemoglobin of 13.4 07/12/2017: Ferritin 18, iron saturation 66% with a hemoglobin of 14.8 09/18/2017: Ferritin 20, iron saturation 38%, hemoglobin 15.1 05/09/2018: Ferritin: 35, iron saturation 68%, hemoglobin 15.6, platelets 899 (increased from 650) 12/18/2018: Ferritin 60, iron saturation 67%, hemoglobin 15, platelet count 993 05/02/2019: Ferritin 85, iron saturation 105%, hemoglobin 13.6, platelets 521 08/12/2019: Ferritin 21, iron saturation 56%, hemoglobin 14.1, platelets 484 09/27/2019: Ferritin 6, iron saturation 15%, hemoglobin 13.2, platelets 555 01/27/2020: Ferritin <4, Iron saturation: 4% Hb 10.7, platelets 350 04/28/2020: Hemoglobin 12.5, ferritin 5, iron saturation 18%, platelets 594 08/10/2020: Hemoglobin 13.3, platelets 698, ferritin 27, iron saturation  49% 10/13/2020: Hemoglobin 13.2, platelets 580, iron saturation 76%, ferritin 42  We decided to hold off on phlebotomy at this time.  (last phlebotomy was in August 2021).  She will change her diet and see if it makes a difference..   Essential thrombocytosis: Currently on Hydrea 500 mg   takes it every other day. Hydrea toxicities: Fatigue, dryness Return to clinic in 2 months with labs and follow-up  Orders Placed This Encounter  Procedures   CBC with Differential ([facility] Only)    Standing Status:   Future    Standing Expiration Date:   10/15/2021   Ferritin    Standing Status:   Future    Standing Expiration Date:   10/15/2021   Iron and TIBC    Standing Status:   Future    Standing Expiration Date:   10/15/2021   CMP ([facility] only)    Standing Status:   Future    Standing Expiration Date:   10/15/2021   The patient has a good understanding of the overall plan. she agrees with it. she will call with any problems that may develop before the next visit here.  Total time spent: 20 mins including face to face time and time spent for planning, charting and coordination of care  Rulon Eisenmenger, MD, MPH 10/15/2020  I, Reinaldo Raddle, am acting as scribe for Dr. Nicholas Lose, MD. I have reviewed the above documentation for accuracy and completeness, and I agree with the above.

## 2020-10-15 ENCOUNTER — Other Ambulatory Visit: Payer: Self-pay

## 2020-10-15 ENCOUNTER — Inpatient Hospital Stay (HOSPITAL_BASED_OUTPATIENT_CLINIC_OR_DEPARTMENT_OTHER): Payer: Medicare Other | Admitting: Hematology and Oncology

## 2020-10-15 VITALS — BP 121/63 | HR 65 | Temp 97.6°F | Resp 17 | Ht 61.0 in | Wt 129.9 lb

## 2020-10-15 DIAGNOSIS — D473 Essential (hemorrhagic) thrombocythemia: Secondary | ICD-10-CM | POA: Diagnosis not present

## 2020-10-15 DIAGNOSIS — Z79899 Other long term (current) drug therapy: Secondary | ICD-10-CM | POA: Diagnosis not present

## 2020-10-15 DIAGNOSIS — Z7982 Long term (current) use of aspirin: Secondary | ICD-10-CM | POA: Diagnosis not present

## 2020-10-15 NOTE — Assessment & Plan Note (Signed)
Genetic testing revealed homozygous C282Y mutation Current treatment:Previously underwent phlebotomy with the goal to keep ferritin less than 100 and saturation less than 50%,no phlebotomies done between August 2018toJanuary 2021. Received phlebotomy sinceJanuary 2021every 2 weeks  Lab review: 03/24/2017:Ferritin10, iron saturation10% with a hemoglobin of 13.4 07/12/2017: Ferritin 18, iron saturation 66% with a hemoglobin of 14.8 09/18/2017: Ferritin 20, iron saturation 38%, hemoglobin 15.1 05/09/2018: Ferritin: 35, iron saturation 68%, hemoglobin 15.6, platelets 899 (increased from 650) 12/18/2018: Ferritin 60, iron saturation 67%, hemoglobin 15, platelet count 993 05/02/2019: Ferritin 85, iron saturation 105%, hemoglobin 13.6, platelets 521 08/12/2019: Ferritin 21, iron saturation 56%, hemoglobin 14.1, platelets 484 09/27/2019: Ferritin 6, iron saturation 15%, hemoglobin 13.2, platelets 555 01/27/2020: Ferritin <4, Iron saturation: 4% Hb 10.7, platelets 350 04/28/2020: Hemoglobin 12.5, ferritin 5, iron saturation 18%, platelets 594 08/10/2020: Hemoglobin 13.3, platelets 698, ferritin 27, iron saturation 49% 10/13/2020: Hemoglobin 13.2, platelets 580, iron saturation 76%, ferritin 42  No phlebotomies planned at this time (last phlebotomy was in August 2021).  We will continue to watch and monitor.  Essential thrombocytosis: Currently on Hydrea 500mg   increased to 4 times a week Hydrea toxicities: Fatigue

## 2020-10-21 ENCOUNTER — Ambulatory Visit (HOSPITAL_COMMUNITY): Payer: Medicare Other

## 2020-10-22 DIAGNOSIS — M9903 Segmental and somatic dysfunction of lumbar region: Secondary | ICD-10-CM | POA: Diagnosis not present

## 2020-10-22 DIAGNOSIS — M5136 Other intervertebral disc degeneration, lumbar region: Secondary | ICD-10-CM | POA: Diagnosis not present

## 2020-10-22 DIAGNOSIS — M9904 Segmental and somatic dysfunction of sacral region: Secondary | ICD-10-CM | POA: Diagnosis not present

## 2020-10-22 DIAGNOSIS — M9905 Segmental and somatic dysfunction of pelvic region: Secondary | ICD-10-CM | POA: Diagnosis not present

## 2020-11-02 ENCOUNTER — Ambulatory Visit (INDEPENDENT_AMBULATORY_CARE_PROVIDER_SITE_OTHER): Payer: Medicare Other | Admitting: Orthopedic Surgery

## 2020-11-02 ENCOUNTER — Other Ambulatory Visit: Payer: Self-pay

## 2020-11-02 ENCOUNTER — Ambulatory Visit (INDEPENDENT_AMBULATORY_CARE_PROVIDER_SITE_OTHER): Payer: Medicare Other

## 2020-11-02 ENCOUNTER — Ambulatory Visit: Payer: Self-pay

## 2020-11-02 ENCOUNTER — Encounter: Payer: Self-pay | Admitting: Orthopedic Surgery

## 2020-11-02 DIAGNOSIS — M79672 Pain in left foot: Secondary | ICD-10-CM

## 2020-11-02 DIAGNOSIS — M79671 Pain in right foot: Secondary | ICD-10-CM | POA: Diagnosis not present

## 2020-11-02 DIAGNOSIS — M2022 Hallux rigidus, left foot: Secondary | ICD-10-CM | POA: Diagnosis not present

## 2020-11-02 NOTE — Progress Notes (Signed)
Office Visit Note   Patient: Kara [name]           Date of Birth: [birth year]           MRN: [id number] Visit Date: 11/02/2020              Requested by: No referring provider defined for this encounter. PCP: Patient, No Pcp Per (Inactive)  Chief Complaint  Patient presents with   Left Foot - Pain   Right Foot - Pain      HPI: Patient is a 70 year old woman who has pain over the medial eminence of the great toe MTP joint bilaterally left worse than right pain with regular shoewear.  She has tried shoewear modification without relief.  Assessment & Plan: Visit Diagnoses:  1. Pain in right foot   2. Pain in left foot   3. Hallux rigidus, left foot     Plan: Discussed with the patient we could proceed with a silver osteotomy to remove the prominent medial eminence as well as a cheilectomy to remove the bony spurs.  Risks and benefits of surgery were discussed including infection and persistent pain.  Patient states she would like to proceed with outpatient surgery.  Follow-Up Instructions: Return if symptoms worsen or fail to improve.   Ortho Exam  Patient is alert, oriented, no adenopathy, well-dressed, normal affect, normal respiratory effort. Examination patient has a good pulse bilaterally.  She does have a mild hallux valgus deformity bilaterally there is a bony prominence medially to the great toe MTP joint worse on the left than the right.  She has decreased range of motion of the left great toe with dorsiflexion only about 30 degrees with hallux rigidus right great toe has dorsiflexion of approximately 60 degrees.  There is no redness no cellulitis no signs of acute gout or infection.  Imaging: XR Foot 2 Views Left  Result Date: 11/02/2020 2 view radiographs of the left foot shows a large prominent eminence great toe MTP joint with joint space narrowing and dorsal osteophytic bone spurs.  XR Foot 2 Views Right  Result Date: 11/02/2020 2 view radio right graphs of the  right foot shows a prominent bony eminence medial aspect of the first metatarsal head with joint space narrowing of the MTP  No images are attached to the encounter.  Labs: Lab Results  Component Value Date   HGBA1C 5.4 04/28/2016   ESRSEDRATE 2 04/28/2016     Lab Results  Component Value Date   ALBUMIN 4.1 10/13/2020   ALBUMIN 4.6 11/04/2019   ALBUMIN 4.6 09/27/2019    No results found for: MG No results found for: VD25OH  No results found for: PREALBUMIN CBC EXTENDED Latest Ref Rng & Units 10/13/2020 08/10/2020 04/28/2020  WBC 4.0 - 10.5 K/uL 7.1 6.4 8.8  RBC 3.87 - 5.11 MIL/uL 4.09 4.41 4.60  HGB 12.0 - 15.0 g/dL 13.2 13.3 12.5  HCT 36.0 - 46.0 % 39.9 41.3 39.8  PLT 150 - 400 K/uL 580(H) 698(H) 594(H)  NEUTROABS 1.7 - 7.7 K/uL 4.4 3.7 5.6  LYMPHSABS 0.7 - 4.0 K/uL 1.9 1.8 2.3     There is no height or weight on file to calculate BMI.  Orders:  Orders Placed This Encounter  Procedures   XR Foot 2 Views Left   XR Foot 2 Views Right   No orders of the defined types were placed in this encounter.    Procedures: No procedures performed  Clinical Data: No additional findings.  ROS:  All other systems negative, except as noted in the HPI. Review of Systems  Objective: Vital Signs: There were no vitals taken for this visit.  Specialty Comments:  No specialty comments available.  PMFS History: Patient Active Problem List   Diagnosis Date Noted   Essential thrombocytosis ([place]) 05/22/2018   Chronic migraine 11/21/2017   Hemochromatosis 06/01/2016   Benign paroxysmal positional vertigo 05/20/2016   Orthostatic hypotension 05/20/2016   Nasal obstruction 05/19/2016   Light headedness 04/28/2016   Screening for hyperlipidemia 04/28/2016   Past Medical History:  Diagnosis Date   Hemochromatosis    Migraine     Family History  Problem Relation Age of Onset   Healthy Mother    Acute myelogenous leukemia Father    Bipolar disorder Sister    Diabetes Neg  Hx    Cancer Neg Hx    Heart failure Neg Hx    Hyperlipidemia Neg Hx    Hypertension Neg Hx     Past Surgical History:  Procedure Laterality Date   NASAL SEPTUM SURGERY     Social History   Occupational History   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

## 2020-11-05 DIAGNOSIS — M9904 Segmental and somatic dysfunction of sacral region: Secondary | ICD-10-CM | POA: Diagnosis not present

## 2020-11-05 DIAGNOSIS — M9905 Segmental and somatic dysfunction of pelvic region: Secondary | ICD-10-CM | POA: Diagnosis not present

## 2020-11-05 DIAGNOSIS — M5136 Other intervertebral disc degeneration, lumbar region: Secondary | ICD-10-CM | POA: Diagnosis not present

## 2020-11-05 DIAGNOSIS — M9903 Segmental and somatic dysfunction of lumbar region: Secondary | ICD-10-CM | POA: Diagnosis not present

## 2020-11-24 DIAGNOSIS — M5136 Other intervertebral disc degeneration, lumbar region: Secondary | ICD-10-CM | POA: Diagnosis not present

## 2020-11-24 DIAGNOSIS — M9905 Segmental and somatic dysfunction of pelvic region: Secondary | ICD-10-CM | POA: Diagnosis not present

## 2020-11-24 DIAGNOSIS — M9904 Segmental and somatic dysfunction of sacral region: Secondary | ICD-10-CM | POA: Diagnosis not present

## 2020-11-24 DIAGNOSIS — M9903 Segmental and somatic dysfunction of lumbar region: Secondary | ICD-10-CM | POA: Diagnosis not present

## 2020-12-11 ENCOUNTER — Inpatient Hospital Stay: Payer: Medicare Other | Admitting: Hematology and Oncology

## 2020-12-11 ENCOUNTER — Inpatient Hospital Stay: Payer: Medicare Other

## 2020-12-14 NOTE — Assessment & Plan Note (Signed)
03/24/2017:Ferritin10, iron saturation10% with a hemoglobin of 13.4 07/12/2017: Ferritin 18, iron saturation 66% with a hemoglobin of 14.8 09/18/2017: Ferritin 20, iron saturation 38%, hemoglobin 15.1 05/09/2018: Ferritin: 35, iron saturation 68%, hemoglobin 15.6, platelets 899 (increased from 650) 12/18/2018: Ferritin 60, iron saturation 67%, hemoglobin 15, platelet count 993 05/02/2019: Ferritin 85, iron saturation 105%, hemoglobin 13.6, platelets 521 08/12/2019: Ferritin 21, iron saturation 56%, hemoglobin 14.1, platelets 484 09/27/2019: Ferritin 6, iron saturation 15%, hemoglobin 13.2, platelets 555 01/27/2020: Ferritin <4, Iron saturation: 4% Hb 10.7, platelets 350 04/28/2020: Hemoglobin 12.5, ferritin 5, iron saturation 18%, platelets 594 08/10/2020: Hemoglobin 13.3, platelets 698, ferritin 27, iron saturation 49% 10/13/2020: Hemoglobin 13.2, platelets 580, iron saturation 76%, ferritin 42  We decided to hold off on phlebotomy at this time.  (last phlebotomy was in August 2021).  She will change her diet and see if it makes a difference..  Essential thrombocytosis: Currently on Hydrea '500mg'$ takes it every other day. Hydrea toxicities: Fatigue, dryness Return to clinic in 2 months with labs and follow-up

## 2020-12-14 NOTE — Progress Notes (Signed)
Patient Care Team: Patient, No Pcp Per (Inactive) as PCP - General (General Practice) Nicholas Lose, MD as Consulting Physician (Hematology and Oncology)  DIAGNOSIS:    ICD-10-CM   1. Hereditary hemochromatosis ([place])  E83.110       CHIEF COMPLIANT: Follow-up of thrombocytosis and hereditary hemochromatosis  INTERVAL HISTORY: Kara [name] is a 70 y.o. with above-mentioned history of thrombocytosis and hereditary hemochromatosis who is currently on '81mg'$  Aspirin, hydroxyurea, and has received phlebotomies (last 11/28/19). She presents to the clinic today for follow-up.  She reports no new problems or concerns.  She has gained some weight and is worrying about that.  Chronic fatigue.  ALLERGIES:  is allergic to penicillins.  MEDICATIONS:  Current Outpatient Medications  Medication Sig Dispense Refill   aspirin EC 81 MG tablet Take 1 tablet (81 mg total) by mouth daily.     hydroxyurea (HYDREA) 500 MG capsule TAKE ONE CAPSULE BY MOUTH DAILY, MAY TAKE WITH FOOD TO MINIMIZE GI SIDE EFFECTS 30 capsule 3   ibuprofen (ADVIL) 800 MG tablet Take 1 tablet (800 mg total) by mouth 2 (two) times daily as needed. 21 tablet 0   No current facility-administered medications for this visit.    PHYSICAL EXAMINATION: ECOG PERFORMANCE STATUS: 1 - Symptomatic but completely ambulatory  Vitals:   12/15/20 1427  BP: 123/74  Pulse: 63  Resp: 18  Temp: 97.6 F (36.4 C)  SpO2: 95%   Filed Weights   12/15/20 1427  Weight: 135 lb 8 oz (61.5 kg)     LABORATORY DATA:  I have reviewed the data as listed CMP Latest Ref Rng & Units 10/13/2020 11/04/2019 11/04/2019  Glucose 70 - 99 mg/dL 92 121(H) 99  BUN 8 - 23 mg/dL '15 20 16  '$ Creatinine 0.44 - 1.00 mg/dL 0.63 0.58 0.72  Sodium 135 - 145 mmol/L 141 137 139  Potassium 3.5 - 5.1 mmol/L 4.4 4.5 4.4  Chloride 98 - 111 mmol/L 110 104 106  CO2 22 - 32 mmol/L '22 25 23  '$ Calcium 8.9 - 10.3 mg/dL 9.3 9.0 9.9  Total Protein 6.5 - 8.1 g/dL 6.6 - 7.5  Total  Bilirubin 0.3 - 1.2 mg/dL 0.3 - 0.3  Alkaline Phos 38 - 126 U/L 72 - 65  AST 15 - 41 U/L 18 - 24  ALT 0 - 44 U/L 9 - 10    Lab Results  Component Value Date   WBC 8.3 12/15/2020   HGB 13.2 12/15/2020   HCT 39.6 12/15/2020   MCV 97.1 12/15/2020   PLT 551 (H) 12/15/2020   NEUTROABS 5.4 12/15/2020    ASSESSMENT & PLAN:  Hemochromatosis 03/24/2017:Ferritin 10, iron saturation 10% with a hemoglobin of 13.4 07/12/2017: Ferritin 18, iron saturation 66% with a hemoglobin of 14.8 09/18/2017: Ferritin 20, iron saturation 38%, hemoglobin 15.1 05/09/2018: Ferritin: 35, iron saturation 68%, hemoglobin 15.6, platelets 899 (increased from 650) 12/18/2018: Ferritin 60, iron saturation 67%, hemoglobin 15, platelet count 993 05/02/2019: Ferritin 85, iron saturation 105%, hemoglobin 13.6, platelets 521 08/12/2019: Ferritin 21, iron saturation 56%, hemoglobin 14.1, platelets 484 09/27/2019: Ferritin 6, iron saturation 15%, hemoglobin 13.2, platelets 555 01/27/2020: Ferritin <4, Iron saturation: 4% Hb 10.7, platelets 350 04/28/2020: Hemoglobin 12.5, ferritin 5, iron saturation 18%, platelets 594 08/10/2020: Hemoglobin 13.3, platelets 698, ferritin 27, iron saturation 49% 10/13/2020: Hemoglobin 13.2, platelets 580, iron saturation 76%, ferritin 42    (last phlebotomy was in August 2021).  Plan: Proceed to phlebotomy today.   Essential thrombocytosis: Currently on Hydrea 500 mg  takes it 3 times a week. Hydrea toxicities: Fatigue, dryness  Return to clinic in 3 months with labs and follow-up    No orders of the defined types were placed in this encounter.  The patient has a good understanding of the overall plan. she agrees with it. she will call with any problems that may develop before the next visit here.  Total time spent: 20 mins including face to face time and time spent for planning, charting and coordination of care  Rulon Eisenmenger, MD, MPH 12/15/2020  I, Thana Ates, am acting as scribe for  Dr. Nicholas Lose.  I have reviewed the above documentation for accuracy and completeness, and I agree with the above.

## 2020-12-15 ENCOUNTER — Inpatient Hospital Stay (HOSPITAL_BASED_OUTPATIENT_CLINIC_OR_DEPARTMENT_OTHER): Payer: Medicare Other | Admitting: Hematology and Oncology

## 2020-12-15 ENCOUNTER — Other Ambulatory Visit: Payer: Self-pay

## 2020-12-15 ENCOUNTER — Inpatient Hospital Stay: Payer: Medicare Other | Attending: Hematology and Oncology

## 2020-12-15 ENCOUNTER — Inpatient Hospital Stay: Payer: Medicare Other

## 2020-12-15 DIAGNOSIS — Z79899 Other long term (current) drug therapy: Secondary | ICD-10-CM | POA: Insufficient documentation

## 2020-12-15 DIAGNOSIS — D473 Essential (hemorrhagic) thrombocythemia: Secondary | ICD-10-CM | POA: Diagnosis not present

## 2020-12-15 DIAGNOSIS — D75839 Thrombocytosis, unspecified: Secondary | ICD-10-CM | POA: Diagnosis not present

## 2020-12-15 DIAGNOSIS — R5382 Chronic fatigue, unspecified: Secondary | ICD-10-CM | POA: Diagnosis not present

## 2020-12-15 DIAGNOSIS — Z880 Allergy status to penicillin: Secondary | ICD-10-CM | POA: Insufficient documentation

## 2020-12-15 LAB — CMP (CANCER CENTER ONLY)
ALT: 12 U/L (ref 0–44)
AST: 17 U/L (ref 15–41)
Albumin: 4.3 g/dL (ref 3.5–5.0)
Alkaline Phosphatase: 69 U/L (ref 38–126)
Anion gap: 9 (ref 5–15)
BUN: 21 mg/dL (ref 8–23)
CO2: 24 mmol/L (ref 22–32)
Calcium: 9.6 mg/dL (ref 8.9–10.3)
Chloride: 106 mmol/L (ref 98–111)
Creatinine: 0.81 mg/dL (ref 0.44–1.00)
GFR, Estimated: >60 mL/min (ref >60)
Glucose, Bld: 88 mg/dL (ref 70–99)
Potassium: 4.3 mmol/L (ref 3.5–5.1)
Sodium: 139 mmol/L (ref 135–145)
Total Bilirubin: 0.5 mg/dL (ref 0.3–1.2)
Total Protein: 7 g/dL (ref 6.5–8.1)

## 2020-12-15 LAB — CBC WITH DIFFERENTIAL (CANCER CENTER ONLY)
Abs Immature Granulocytes: 0.03 10*3/uL (ref 0.00–0.07)
Basophils Absolute: 0 10*3/uL (ref 0.0–0.1)
Basophils Relative: 0 %
Eosinophils Absolute: 0.2 10*3/uL (ref 0.0–0.5)
Eosinophils Relative: 2 %
HCT: 39.6 % (ref 36.0–46.0)
Hemoglobin: 13.2 g/dL (ref 12.0–15.0)
Immature Granulocytes: 0 %
Lymphocytes Relative: 24 %
Lymphs Abs: 2 10*3/uL (ref 0.7–4.0)
MCH: 32.4 pg (ref 26.0–34.0)
MCHC: 33.3 g/dL (ref 30.0–36.0)
MCV: 97.1 fL (ref 80.0–100.0)
Monocytes Absolute: 0.7 10*3/uL (ref 0.1–1.0)
Monocytes Relative: 9 %
Neutro Abs: 5.4 10*3/uL (ref 1.7–7.7)
Neutrophils Relative %: 65 %
Platelet Count: 551 10*3/uL — ABNORMAL HIGH (ref 150–400)
RBC: 4.08 MIL/uL (ref 3.87–5.11)
RDW: 14.6 % (ref 11.5–15.5)
WBC Count: 8.3 10*3/uL (ref 4.0–10.5)
nRBC: 0 % (ref 0.0–0.2)

## 2020-12-15 LAB — IRON AND TIBC
Iron: 240 ug/dL — ABNORMAL HIGH (ref 41–142)
Saturation Ratios: 103 % — ABNORMAL HIGH (ref 21–57)
TIBC: 233 ug/dL — ABNORMAL LOW (ref 236–444)
UIBC: UNDETERMINED ug/dL (ref 120–384)

## 2020-12-15 LAB — FERRITIN: Ferritin: 37 ng/mL (ref 11–307)

## 2020-12-15 MED ORDER — SODIUM CHLORIDE 0.9 % IV SOLN: Freq: Once | INTRAVENOUS | Status: AC

## 2020-12-15 NOTE — Progress Notes (Signed)
Pt here for phlebotomy.  IV stick L antecubital with 20 ga cath & obtained @ 36 grams of blood & stopped.  D/c'd intact & restarted in L lower forearm with 18 ga cath & obtained 500 grams of blood.  Pt a little weak & felt funny.  Feet elevated & head lowered.  BP dropped some.  IVF started @ 500 ml/h.  Oral fluids & snacks offered & pt felt better.  BP better at d/c.

## 2020-12-17 DIAGNOSIS — M9904 Segmental and somatic dysfunction of sacral region: Secondary | ICD-10-CM | POA: Diagnosis not present

## 2020-12-17 DIAGNOSIS — M5136 Other intervertebral disc degeneration, lumbar region: Secondary | ICD-10-CM | POA: Diagnosis not present

## 2020-12-17 DIAGNOSIS — M9905 Segmental and somatic dysfunction of pelvic region: Secondary | ICD-10-CM | POA: Diagnosis not present

## 2020-12-17 DIAGNOSIS — M9903 Segmental and somatic dysfunction of lumbar region: Secondary | ICD-10-CM | POA: Diagnosis not present

## 2020-12-22 ENCOUNTER — Telehealth: Payer: Self-pay | Admitting: Hematology and Oncology

## 2020-12-22 NOTE — Telephone Encounter (Signed)
Scheduled appt per sch msg. Called and spoke with patient. Confirmed appt  

## 2020-12-24 DIAGNOSIS — H0102B Squamous blepharitis left eye, upper and lower eyelids: Secondary | ICD-10-CM | POA: Diagnosis not present

## 2020-12-24 DIAGNOSIS — H2513 Age-related nuclear cataract, bilateral: Secondary | ICD-10-CM | POA: Diagnosis not present

## 2020-12-24 DIAGNOSIS — H43811 Vitreous degeneration, right eye: Secondary | ICD-10-CM | POA: Diagnosis not present

## 2020-12-24 DIAGNOSIS — H0102A Squamous blepharitis right eye, upper and lower eyelids: Secondary | ICD-10-CM | POA: Diagnosis not present

## 2020-12-24 DIAGNOSIS — D492 Neoplasm of unspecified behavior of bone, soft tissue, and skin: Secondary | ICD-10-CM | POA: Diagnosis not present

## 2020-12-24 DIAGNOSIS — H04123 Dry eye syndrome of bilateral lacrimal glands: Secondary | ICD-10-CM | POA: Diagnosis not present

## 2021-01-07 DIAGNOSIS — M9904 Segmental and somatic dysfunction of sacral region: Secondary | ICD-10-CM | POA: Diagnosis not present

## 2021-01-07 DIAGNOSIS — M9903 Segmental and somatic dysfunction of lumbar region: Secondary | ICD-10-CM | POA: Diagnosis not present

## 2021-01-07 DIAGNOSIS — M5136 Other intervertebral disc degeneration, lumbar region: Secondary | ICD-10-CM | POA: Diagnosis not present

## 2021-01-07 DIAGNOSIS — M9905 Segmental and somatic dysfunction of pelvic region: Secondary | ICD-10-CM | POA: Diagnosis not present

## 2021-01-08 ENCOUNTER — Other Ambulatory Visit: Payer: Self-pay

## 2021-01-08 DIAGNOSIS — D649 Anemia, unspecified: Secondary | ICD-10-CM

## 2021-01-08 NOTE — Progress Notes (Signed)
Pt called and asks if we can add TSH to her upcoming lab appt as she has gained weight. OK per Dr Lindi Adie. Orders entered. Pt verbalized thanks and understanding.

## 2021-01-11 ENCOUNTER — Other Ambulatory Visit: Payer: Self-pay | Admitting: Hematology and Oncology

## 2021-01-11 ENCOUNTER — Other Ambulatory Visit: Payer: Self-pay

## 2021-01-12 ENCOUNTER — Other Ambulatory Visit: Payer: Self-pay

## 2021-01-12 ENCOUNTER — Inpatient Hospital Stay: Payer: Medicare Other | Attending: Hematology and Oncology

## 2021-01-12 DIAGNOSIS — D649 Anemia, unspecified: Secondary | ICD-10-CM

## 2021-01-12 DIAGNOSIS — R635 Abnormal weight gain: Secondary | ICD-10-CM | POA: Insufficient documentation

## 2021-01-12 DIAGNOSIS — D473 Essential (hemorrhagic) thrombocythemia: Secondary | ICD-10-CM | POA: Insufficient documentation

## 2021-01-12 LAB — IRON AND TIBC
Iron: 173 ug/dL — ABNORMAL HIGH (ref 41–142)
Saturation Ratios: 71 % — ABNORMAL HIGH (ref 21–57)
TIBC: 244 ug/dL (ref 236–444)
UIBC: 71 ug/dL — ABNORMAL LOW (ref 120–384)

## 2021-01-12 LAB — CBC WITH DIFFERENTIAL (CANCER CENTER ONLY)
Abs Immature Granulocytes: 0.01 10*3/uL (ref 0.00–0.07)
Basophils Absolute: 0 10*3/uL (ref 0.0–0.1)
Basophils Relative: 1 %
Eosinophils Absolute: 0.2 10*3/uL (ref 0.0–0.5)
Eosinophils Relative: 3 %
HCT: 39.1 % (ref 36.0–46.0)
Hemoglobin: 13.1 g/dL (ref 12.0–15.0)
Immature Granulocytes: 0 %
Lymphocytes Relative: 22 %
Lymphs Abs: 1.5 10*3/uL (ref 0.7–4.0)
MCH: 33 pg (ref 26.0–34.0)
MCHC: 33.5 g/dL (ref 30.0–36.0)
MCV: 98.5 fL (ref 80.0–100.0)
Monocytes Absolute: 0.5 10*3/uL (ref 0.1–1.0)
Monocytes Relative: 8 %
Neutro Abs: 4.4 10*3/uL (ref 1.7–7.7)
Neutrophils Relative %: 66 %
Platelet Count: 485 10*3/uL — ABNORMAL HIGH (ref 150–400)
RBC: 3.97 MIL/uL (ref 3.87–5.11)
RDW: 14.6 % (ref 11.5–15.5)
WBC Count: 6.6 10*3/uL (ref 4.0–10.5)
nRBC: 0 % (ref 0.0–0.2)

## 2021-01-12 LAB — CMP (CANCER CENTER ONLY)
ALT: 10 U/L (ref 0–44)
AST: 16 U/L (ref 15–41)
Albumin: 4.4 g/dL (ref 3.5–5.0)
Alkaline Phosphatase: 64 U/L (ref 38–126)
Anion gap: 11 (ref 5–15)
BUN: 10 mg/dL (ref 8–23)
CO2: 21 mmol/L — ABNORMAL LOW (ref 22–32)
Calcium: 9.4 mg/dL (ref 8.9–10.3)
Chloride: 108 mmol/L (ref 98–111)
Creatinine: 0.68 mg/dL (ref 0.44–1.00)
GFR, Estimated: >60 mL/min (ref >60)
Glucose, Bld: 94 mg/dL (ref 70–99)
Potassium: 4.2 mmol/L (ref 3.5–5.1)
Sodium: 140 mmol/L (ref 135–145)
Total Bilirubin: 0.5 mg/dL (ref 0.3–1.2)
Total Protein: 7.1 g/dL (ref 6.5–8.1)

## 2021-01-12 LAB — FERRITIN: Ferritin: 21 ng/mL (ref 11–307)

## 2021-01-12 LAB — TSH: TSH: 1.155 u[IU]/mL (ref 0.308–3.960)

## 2021-01-13 NOTE — Progress Notes (Signed)
  HEMATOLOGY-ONCOLOGY TELEPHONE VISIT PROGRESS NOTE  I connected with Kara [name] on 01/14/2021 at  9:15 AM EDT by telephone and verified that I am speaking with the correct person using two identifiers.  I discussed the limitations, risks, security and privacy concerns of performing an evaluation and management service by telephone and the availability of in person appointments.  I also discussed with the patient that there may be a patient responsible charge related to this service. The patient expressed understanding and agreed to proceed.   History of Present Illness: Kara [name] is a 70 y.o. female with above-mentioned history of thrombocytosis and hereditary hemochromatosis who is currently on 81mg  Aspirin, hydroxyurea, and has received phlebotomies (last 12/16/20). She presents via telephone today for follow-up.  She has a lot of adverse effects to hydroxyurea including difficulties with urination and fatigue and some degree of GI issues as well.  She would like to switch to anagrelide.  Observations/Objective:     Assessment Plan:  Hemochromatosis 03/24/2017:Ferritin 10, iron saturation 10% with a hemoglobin of 13.4 07/12/2017: Ferritin 18, iron saturation 66% with a hemoglobin of 14.8 09/18/2017: Ferritin 20, iron saturation 38%, hemoglobin 15.1 05/09/2018: Ferritin: 35, iron saturation 68%, hemoglobin 15.6, platelets 899 (increased from 650) 12/18/2018: Ferritin 60, iron saturation 67%, hemoglobin 15, platelet count 993 05/02/2019: Ferritin 85, iron saturation 105%, hemoglobin 13.6, platelets 521 08/12/2019: Ferritin 21, iron saturation 56%, hemoglobin 14.1, platelets 484 09/27/2019: Ferritin 6, iron saturation 15%, hemoglobin 13.2, platelets 555 01/27/2020: Ferritin <4, Iron saturation: 4% Hb 10.7, platelets 350 04/28/2020: Hemoglobin 12.5, ferritin 5, iron saturation 18%, platelets 594 08/10/2020: Hemoglobin 13.3, platelets 698, ferritin 27, iron saturation 49% 10/13/2020: Hemoglobin 13.2,  platelets 580, iron saturation 76%, ferritin 42 01/12/2021: Hemoglobin 13.1, platelets 485, iron saturation 71% (decreased from 103%), ferritin 21   (Prior phlebotomy was in August 2021 and recently on 12/15/20).  Plan: Phlebotomy in Oct and November   Essential thrombocytosis: Currently on Hydrea 500 mg   takes it 3 times a week. We will switch her from Hydrea to anagrelide Hydrea toxicities: Fatigue, dryness  We will schedule her for phlebotomy in October and November and I will see her back to discuss further treatments.    I discussed the assessment and treatment plan with the patient. The patient was provided an opportunity to ask questions and all were answered. The patient agreed with the plan and demonstrated an understanding of the instructions. The patient was advised to call back or seek an in-person evaluation if the symptoms worsen or if the condition fails to improve as anticipated.   Total time spent: 12 mins including non-face to face time and time spent for planning, charting and coordination of care  Rulon Eisenmenger, MD 01/14/2021    I, Thana Ates, am acting as scribe for Nicholas Lose, MD.  I have reviewed the above documentation for accuracy and completeness, and I agree with the above.

## 2021-01-14 ENCOUNTER — Inpatient Hospital Stay (HOSPITAL_BASED_OUTPATIENT_CLINIC_OR_DEPARTMENT_OTHER): Payer: Medicare Other | Admitting: Hematology and Oncology

## 2021-01-14 ENCOUNTER — Inpatient Hospital Stay: Payer: Medicare Other | Admitting: Hematology and Oncology

## 2021-01-14 DIAGNOSIS — D473 Essential (hemorrhagic) thrombocythemia: Secondary | ICD-10-CM

## 2021-01-14 MED ORDER — ANAGRELIDE HCL 1 MG PO CAPS: 1.0000 mg | ORAL_CAPSULE | Freq: Every day | ORAL | 6 refills | Status: DC

## 2021-01-14 NOTE — Assessment & Plan Note (Signed)
03/24/2017:Ferritin10, iron saturation10% with a hemoglobin of 13.4 07/12/2017: Ferritin 18, iron saturation 66% with a hemoglobin of 14.8 09/18/2017: Ferritin 20, iron saturation 38%, hemoglobin 15.1 05/09/2018: Ferritin: 35, iron saturation 68%, hemoglobin 15.6, platelets 899 (increased from 650) 12/18/2018: Ferritin 60, iron saturation 67%, hemoglobin 15, platelet count 993 05/02/2019: Ferritin 85, iron saturation 105%, hemoglobin 13.6, platelets 521 08/12/2019: Ferritin 21, iron saturation 56%, hemoglobin 14.1, platelets 484 09/27/2019: Ferritin 6, iron saturation 15%, hemoglobin 13.2, platelets 555 01/27/2020: Ferritin <4, Iron saturation: 4% Hb 10.7, platelets 350 04/28/2020: Hemoglobin 12.5, ferritin 5, iron saturation 18%, platelets 594 08/10/2020: Hemoglobin 13.3, platelets 698, ferritin 27, iron saturation 49% 10/13/2020: Hemoglobin 13.2, platelets 580, iron saturation 76%, ferritin 42 01/12/2021: Hemoglobin 13.1, platelets 485, iron saturation 71% (decreased from 103%), ferritin 21  (Prior phlebotomy was in August 2021 and recently on 12/15/20). Plan: Phlebotomy every 3 months  Essential thrombocytosis: Currently on Hydrea 500mg takes it 3 times a week. Hydrea toxicities: Fatigue, dryness  Return to clinic in 3 months with labs and follow-up

## 2021-01-19 DIAGNOSIS — D492 Neoplasm of unspecified behavior of bone, soft tissue, and skin: Secondary | ICD-10-CM | POA: Diagnosis not present

## 2021-01-19 DIAGNOSIS — D23112 Other benign neoplasm of skin of right lower eyelid, including canthus: Secondary | ICD-10-CM | POA: Diagnosis not present

## 2021-01-21 DIAGNOSIS — M9905 Segmental and somatic dysfunction of pelvic region: Secondary | ICD-10-CM | POA: Diagnosis not present

## 2021-01-21 DIAGNOSIS — M9904 Segmental and somatic dysfunction of sacral region: Secondary | ICD-10-CM | POA: Diagnosis not present

## 2021-01-21 DIAGNOSIS — M5136 Other intervertebral disc degeneration, lumbar region: Secondary | ICD-10-CM | POA: Diagnosis not present

## 2021-01-21 DIAGNOSIS — M9903 Segmental and somatic dysfunction of lumbar region: Secondary | ICD-10-CM | POA: Diagnosis not present

## 2021-01-28 DIAGNOSIS — J3489 Other specified disorders of nose and nasal sinuses: Secondary | ICD-10-CM | POA: Diagnosis not present

## 2021-01-28 DIAGNOSIS — J342 Deviated nasal septum: Secondary | ICD-10-CM | POA: Diagnosis not present

## 2021-01-28 DIAGNOSIS — M950 Acquired deformity of nose: Secondary | ICD-10-CM | POA: Diagnosis not present

## 2021-02-04 DIAGNOSIS — M5136 Other intervertebral disc degeneration, lumbar region: Secondary | ICD-10-CM | POA: Diagnosis not present

## 2021-02-04 DIAGNOSIS — M9903 Segmental and somatic dysfunction of lumbar region: Secondary | ICD-10-CM | POA: Diagnosis not present

## 2021-02-04 DIAGNOSIS — M9904 Segmental and somatic dysfunction of sacral region: Secondary | ICD-10-CM | POA: Diagnosis not present

## 2021-02-04 DIAGNOSIS — M9905 Segmental and somatic dysfunction of pelvic region: Secondary | ICD-10-CM | POA: Diagnosis not present

## 2021-02-12 ENCOUNTER — Inpatient Hospital Stay: Payer: Medicare Other

## 2021-02-18 ENCOUNTER — Other Ambulatory Visit: Payer: Medicare Other

## 2021-02-18 DIAGNOSIS — M9904 Segmental and somatic dysfunction of sacral region: Secondary | ICD-10-CM | POA: Diagnosis not present

## 2021-02-18 DIAGNOSIS — M9905 Segmental and somatic dysfunction of pelvic region: Secondary | ICD-10-CM | POA: Diagnosis not present

## 2021-02-18 DIAGNOSIS — M5136 Other intervertebral disc degeneration, lumbar region: Secondary | ICD-10-CM | POA: Diagnosis not present

## 2021-02-18 DIAGNOSIS — M9903 Segmental and somatic dysfunction of lumbar region: Secondary | ICD-10-CM | POA: Diagnosis not present

## 2021-02-19 ENCOUNTER — Inpatient Hospital Stay: Payer: Medicare Other | Attending: Hematology and Oncology

## 2021-02-19 ENCOUNTER — Inpatient Hospital Stay: Payer: Medicare Other

## 2021-02-19 ENCOUNTER — Other Ambulatory Visit: Payer: Self-pay

## 2021-02-19 DIAGNOSIS — D473 Essential (hemorrhagic) thrombocythemia: Secondary | ICD-10-CM | POA: Insufficient documentation

## 2021-02-19 LAB — CBC WITH DIFFERENTIAL (CANCER CENTER ONLY)
Abs Immature Granulocytes: 0.02 10*3/uL (ref 0.00–0.07)
Basophils Absolute: 0 10*3/uL (ref 0.0–0.1)
Basophils Relative: 0 %
Eosinophils Absolute: 0.2 10*3/uL (ref 0.0–0.5)
Eosinophils Relative: 2 %
HCT: 39.5 % (ref 36.0–46.0)
Hemoglobin: 12.9 g/dL (ref 12.0–15.0)
Immature Granulocytes: 0 %
Lymphocytes Relative: 23 %
Lymphs Abs: 1.9 10*3/uL (ref 0.7–4.0)
MCH: 32.3 pg (ref 26.0–34.0)
MCHC: 32.7 g/dL (ref 30.0–36.0)
MCV: 98.8 fL (ref 80.0–100.0)
Monocytes Absolute: 0.6 10*3/uL (ref 0.1–1.0)
Monocytes Relative: 7 %
Neutro Abs: 5.3 10*3/uL (ref 1.7–7.7)
Neutrophils Relative %: 68 %
Platelet Count: 612 10*3/uL — ABNORMAL HIGH (ref 150–400)
RBC: 4 MIL/uL (ref 3.87–5.11)
RDW: 14 % (ref 11.5–15.5)
WBC Count: 8 10*3/uL (ref 4.0–10.5)
nRBC: 0 % (ref 0.0–0.2)

## 2021-02-19 LAB — IRON AND TIBC
Iron: 133 ug/dL (ref 41–142)
Saturation Ratios: 59 % — ABNORMAL HIGH (ref 21–57)
TIBC: 225 ug/dL — ABNORMAL LOW (ref 236–444)
UIBC: 92 ug/dL — ABNORMAL LOW (ref 120–384)

## 2021-02-19 LAB — FERRITIN: Ferritin: 24 ng/mL (ref 11–307)

## 2021-02-19 MED ORDER — SODIUM CHLORIDE 0.9 % IV SOLN: Freq: Once | INTRAVENOUS | Status: DC

## 2021-02-19 NOTE — Progress Notes (Signed)
Patient presenting for therapeutic phlebotomy per MD orders.  Phlebotomy procedure began at 1438 and ended at 11448 via 18G needle in R AC  removal of 518 g of blood. Patient received additional fluids and observed. Food and drinks provided. Vital signs remained stable. No signs of distress.

## 2021-02-20 ENCOUNTER — Encounter: Payer: Self-pay | Admitting: Hematology and Oncology

## 2021-02-22 ENCOUNTER — Encounter: Payer: Self-pay | Admitting: Hematology and Oncology

## 2021-02-22 NOTE — Progress Notes (Signed)
Called patient to discuss one-time $1000 [employer] grant to assist with medications and other personal expenses while going through treatment.  Based on verbal income, she qualifies for the grant. She will send me documentation to complete process.  Provided her my email and contact number for any additional financial questions or concerns.  Staff message sent to clinical team to advise to send needed medication prescription to [facility] to be filled.

## 2021-02-23 NOTE — Progress Notes (Signed)
  HEMATOLOGY-ONCOLOGY TELEPHONE VISIT PROGRESS NOTE  I connected with Kara [name] on 02/24/2021 at 12:00 PM EST by telephone and verified that I am speaking with the correct person using two identifiers.  I discussed the limitations, risks, security and privacy concerns of performing an evaluation and management service by telephone and the availability of in person appointments.  I also discussed with the patient that there may be a patient responsible charge related to this service. The patient expressed understanding and agreed to proceed.   History of Present Illness: Kara [name] is a 70 y.o. female with above-mentioned history of thrombocytosis and hereditary hemochromatosis who is currently on 81mg  Aspirin, hydroxyurea, and has received phlebotomies (last 12/16/20). She presents via telephone today for follow-up.   Observations/Objective:     Assessment Plan:  Hemochromatosis 03/24/2017:Ferritin 10, iron saturation 10% with a hemoglobin of 13.4 07/12/2017: Ferritin 18, iron saturation 66% with a hemoglobin of 14.8 05/09/2018: Ferritin: 35, iron saturation 68%, hemoglobin 15.6, platelets 899 (increased from 650) 12/18/2018: Ferritin 60, iron saturation 67%, hemoglobin 15, platelet count 993 08/12/2019: Ferritin 21, iron saturation 56%, hemoglobin 14.1, platelets 484 04/28/2020: Hemoglobin 12.5, ferritin 5, iron saturation 18%, platelets 594 10/13/2020: Hemoglobin 13.2, platelets 580, iron saturation 76%, ferritin 42 02/19/2021: Hemoglobin 12.9, platelets 612, iron saturation 59%, ferritin 24     Essential thrombocytosis: She used to be on Hydrea and we switched her to anagrelide.  Apparently insurance does not cover anagrelide.  Therefore we will switch her back to Hydrea  Hydrea toxicities: Fatigue, dryness   We will schedule her for phlebotomy in November and December and I will do a telephone visit 2 days after the December phlebotomy to discuss the results and come up with an extra  steps.    I discussed the assessment and treatment plan with the patient. The patient was provided an opportunity to ask questions and all were answered. The patient agreed with the plan and demonstrated an understanding of the instructions. The patient was advised to call back or seek an in-person evaluation if the symptoms worsen or if the condition fails to improve as anticipated.   Total time spent: 12 mins including non-face to face time and time spent for planning, charting and coordination of care  Rulon Eisenmenger, MD 02/24/2021    I, Thana Ates, am acting as scribe for Nicholas Lose, MD.  I have reviewed the above documentation for accuracy and completeness, and I agree with the above.

## 2021-02-24 ENCOUNTER — Inpatient Hospital Stay (HOSPITAL_BASED_OUTPATIENT_CLINIC_OR_DEPARTMENT_OTHER): Payer: Medicare Other | Admitting: Hematology and Oncology

## 2021-02-24 DIAGNOSIS — D473 Essential (hemorrhagic) thrombocythemia: Secondary | ICD-10-CM | POA: Diagnosis not present

## 2021-02-24 NOTE — Assessment & Plan Note (Addendum)
03/24/2017:Ferritin10, iron saturation10% with a hemoglobin of 13.4 07/12/2017: Ferritin 18, iron saturation 66% with a hemoglobin of 14.8 05/09/2018: Ferritin: 35, iron saturation 68%, hemoglobin 15.6, platelets 899 (increased from 650) 12/18/2018: Ferritin 60, iron saturation 67%, hemoglobin 15, platelet count 993 08/12/2019: Ferritin 21, iron saturation 56%, hemoglobin 14.1, platelets 484 04/28/2020: Hemoglobin 12.5, ferritin 5, iron saturation 18%, platelets 594 10/13/2020: Hemoglobin 13.2, platelets 580, iron saturation 76%, ferritin 42 02/19/2021: Hemoglobin 12.9, platelets 612, iron saturation 59%, ferritin 24  (Prior phlebotomy was in August 2021 and recently on 12/15/20). Plan: Phlebotomy in Oct and November  Essential thrombocytosis: She used to be on Hydrea and we switched her to anagrelide.  Apparently insurance does not cover anagrelide.  Therefore we will switch her back to Hydrea Hydrea toxicities: Fatigue, dryness  We will schedule her for phlebotomy in October and November and I will see her back to discuss further treatments.

## 2021-03-04 DIAGNOSIS — M9905 Segmental and somatic dysfunction of pelvic region: Secondary | ICD-10-CM | POA: Diagnosis not present

## 2021-03-04 DIAGNOSIS — M9904 Segmental and somatic dysfunction of sacral region: Secondary | ICD-10-CM | POA: Diagnosis not present

## 2021-03-04 DIAGNOSIS — M5136 Other intervertebral disc degeneration, lumbar region: Secondary | ICD-10-CM | POA: Diagnosis not present

## 2021-03-04 DIAGNOSIS — M9903 Segmental and somatic dysfunction of lumbar region: Secondary | ICD-10-CM | POA: Diagnosis not present

## 2021-03-04 NOTE — Progress Notes (Signed)
Faxed completed medical clearance form to [phone]

## 2021-03-12 ENCOUNTER — Inpatient Hospital Stay: Payer: Medicare Other

## 2021-03-15 ENCOUNTER — Inpatient Hospital Stay: Payer: Medicare Other

## 2021-03-18 ENCOUNTER — Telehealth: Payer: Medicare Other | Admitting: Hematology and Oncology

## 2021-03-26 ENCOUNTER — Inpatient Hospital Stay: Payer: Medicare Other

## 2021-03-30 ENCOUNTER — Inpatient Hospital Stay: Payer: Medicare Other | Attending: Hematology and Oncology

## 2021-03-30 ENCOUNTER — Inpatient Hospital Stay: Payer: Medicare Other

## 2021-03-30 DIAGNOSIS — Z79899 Other long term (current) drug therapy: Secondary | ICD-10-CM | POA: Insufficient documentation

## 2021-03-30 DIAGNOSIS — D473 Essential (hemorrhagic) thrombocythemia: Secondary | ICD-10-CM | POA: Insufficient documentation

## 2021-03-31 ENCOUNTER — Telehealth: Payer: Self-pay

## 2021-03-31 DIAGNOSIS — M9904 Segmental and somatic dysfunction of sacral region: Secondary | ICD-10-CM | POA: Diagnosis not present

## 2021-03-31 DIAGNOSIS — M9903 Segmental and somatic dysfunction of lumbar region: Secondary | ICD-10-CM | POA: Diagnosis not present

## 2021-03-31 DIAGNOSIS — M5136 Other intervertebral disc degeneration, lumbar region: Secondary | ICD-10-CM | POA: Diagnosis not present

## 2021-03-31 DIAGNOSIS — M9905 Segmental and somatic dysfunction of pelvic region: Secondary | ICD-10-CM | POA: Diagnosis not present

## 2021-03-31 NOTE — Telephone Encounter (Signed)
-----   Message from Teodoro Spray, RN sent at 03/30/2021  2:19 PM EST ----- Regarding: Patient no show Good afternoon,   Patient was scheduled for lab/phlebotomy at the [place] location today (12/13) and has not shown for her appointments (1pm lab, 2pm phlebotomy).  Not sure if she reached out to your office regarding her appts for today? Unfortunately we are unable to accomodate her today due to time.  Her appointments will need to be rescheduled.  Thank you,  Marita [place], RN

## 2021-03-31 NOTE — Telephone Encounter (Signed)
Pt was scheduled for labs & phlebotomy at [place] yesterday. I received a message from Wonda Olds, RN stating pt was [place]/[facility]. I called pt this morning to check on her and she states she called [facility] yesterday to r/s and whoever she spoke with said they would r/s her for this Friday, 12/16 but her schedule does not reflect this. Message sent to Lupita Raider, Nicholaus Corolla, and Lenox Ponds for f/u on this. Pt is aware and verbalized thanks\.

## 2021-04-01 ENCOUNTER — Other Ambulatory Visit: Payer: Self-pay | Admitting: *Deleted

## 2021-04-01 DIAGNOSIS — M950 Acquired deformity of nose: Secondary | ICD-10-CM | POA: Diagnosis not present

## 2021-04-01 DIAGNOSIS — J3489 Other specified disorders of nose and nasal sinuses: Secondary | ICD-10-CM | POA: Diagnosis not present

## 2021-04-01 DIAGNOSIS — J342 Deviated nasal septum: Secondary | ICD-10-CM | POA: Diagnosis not present

## 2021-04-01 NOTE — Progress Notes (Signed)
Order obtained for 500cc normal saline to infuse over 1 hour on 04/02/2021 from Dr. Lindi Adie.

## 2021-04-02 ENCOUNTER — Inpatient Hospital Stay: Payer: Medicare Other

## 2021-04-02 ENCOUNTER — Other Ambulatory Visit: Payer: Self-pay

## 2021-04-02 DIAGNOSIS — Z79899 Other long term (current) drug therapy: Secondary | ICD-10-CM | POA: Diagnosis not present

## 2021-04-02 DIAGNOSIS — D473 Essential (hemorrhagic) thrombocythemia: Secondary | ICD-10-CM | POA: Diagnosis not present

## 2021-04-02 LAB — CBC WITH DIFFERENTIAL (CANCER CENTER ONLY)
Abs Immature Granulocytes: 0.03 10*3/uL (ref 0.00–0.07)
Basophils Absolute: 0.1 10*3/uL (ref 0.0–0.1)
Basophils Relative: 1 %
Eosinophils Absolute: 0.2 10*3/uL (ref 0.0–0.5)
Eosinophils Relative: 2 %
HCT: 41 % (ref 36.0–46.0)
Hemoglobin: 13.6 g/dL (ref 12.0–15.0)
Immature Granulocytes: 0 %
Lymphocytes Relative: 22 %
Lymphs Abs: 2.2 10*3/uL (ref 0.7–4.0)
MCH: 32 pg (ref 26.0–34.0)
MCHC: 33.2 g/dL (ref 30.0–36.0)
MCV: 96.5 fL (ref 80.0–100.0)
Monocytes Absolute: 0.6 10*3/uL (ref 0.1–1.0)
Monocytes Relative: 6 %
Neutro Abs: 7 10*3/uL (ref 1.7–7.7)
Neutrophils Relative %: 69 %
Platelet Count: 739 10*3/uL — ABNORMAL HIGH (ref 150–400)
RBC: 4.25 MIL/uL (ref 3.87–5.11)
RDW: 14.1 % (ref 11.5–15.5)
WBC Count: 10 10*3/uL (ref 4.0–10.5)
nRBC: 0 % (ref 0.0–0.2)

## 2021-04-02 LAB — FERRITIN: Ferritin: 17 ng/mL (ref 11–307)

## 2021-04-02 LAB — IRON AND TIBC
Iron: 134 ug/dL (ref 28–170)
Saturation Ratios: 50 % — ABNORMAL HIGH (ref 10.4–31.8)
TIBC: 270 ug/dL (ref 250–450)
UIBC: 136 ug/dL

## 2021-04-02 MED ORDER — SODIUM CHLORIDE 0.9 % IV SOLN: INTRAVENOUS | Status: DC

## 2021-04-02 MED ORDER — SODIUM CHLORIDE 0.9 % IV SOLN: Freq: Once | INTRAVENOUS | Status: DC

## 2021-04-02 NOTE — Progress Notes (Signed)
Kara [name] presents today for phlebotomy per MD orders. Phlebotomy procedure started at 1440 and ended at 1450 with 507 grams removed. A 16 gauge phlebotomy kit in the L AC was used.  IV needle removed intact. Patient tolerated treatment well and received IV fluids for 1 hour after phlebotomy completion.

## 2021-04-02 NOTE — Patient Instructions (Addendum)
Therapeutic Phlebotomy, Care After The following information offers guidance on how to care for yourself after your procedure. Your health care provider may also give you more specific instructions. If you have problems or questions, contact your health care provider. What can I expect after the procedure? After therapeutic phlebotomy, it is common to have: Light-headedness or dizziness. You may feel faint. Nausea. Tiredness (fatigue). Follow these instructions at home: Eating and drinking Be sure to eat well-balanced meals for the next 24 hours. Drink enough fluid to keep your urine pale yellow. Avoid drinking alcohol on the day that you had the procedure. Activity  Return to your normal activities as told by your health care provider. Most people can go back to their normal activities right away. Avoid activities that take a lot of effort for about 5 hours after the procedure. Athletes should avoid strenuous exercise for at least 12 hours. Avoid heavy lifting or pulling for about 5 hours after the procedure. Do not lift anything that is heavier than 10 lb (4.5 kg). Change positions slowly for the remainder of the day, like from sitting to standing. This can help prevent light-headedness or fainting. If you feel light-headed, lie down until the feeling goes away. Needle insertion site care  Keep your bandage (dressing) dry. You can remove the bandage after about 5 hours or as told by your health care provider. If you have bleeding from the needle insertion site, raise (elevate) your arm and press firmly on the site until the bleeding stops. If you have bruising at the site, apply ice to the area. To do this: Put ice in a plastic bag. Place a towel between your skin and the bag. Leave the ice on for 20 minutes, 2-3 times a day for the first 24 hours. Remove the ice if your skin turns bright red so you do not damage the area. If the swelling does not go away after 24 hours, apply a warm,  moist cloth (warm compress) to the area for 20 minutes, 2-3 times a day. General instructions Do not use any products that contain nicotine or tobacco, like cigarettes, chewing tobacco, and vaping devices, such as e-cigarettes, for at least 30 minutes after the procedure. If you need help quitting, ask your health care provider. Keep all follow-up visits. You may need to continue having regular blood tests and therapeutic phlebotomy treatments as directed. Contact a health care provider if: You have redness, swelling, or pain at the needle insertion site. Fluid or blood is coming from the needle insertion site. Pus or a bad smell is coming from the needle insertion site. The needle insertion site feels warm to the touch. You feel light-headed, dizzy, or nauseous, and the feeling does not go away. You have new bruising at the needle insertion site. You feel weaker than normal. You have a fever or chills. Get help right away if: You have chest pain. You have trouble breathing. You have severe nausea or vomiting. Summary After the procedure, it is common to have some light-headedness, dizziness, nausea, or tiredness (fatigue). Be sure to eat well-balanced meals for the next 24 hours. Drink enough fluid to keep your urine pale yellow. Return to your normal activities as told by your health care provider. Keep all follow-up visits. You may need to continue having regular blood tests and therapeutic phlebotomy treatments as directed. This information is not intended to replace advice given to you by your health care provider. Make sure you discuss any questions you have  with your health care provider. Document Revised: 09/30/2020 Document Reviewed: 09/30/2020  Elsevier Patient Education  [place].  Rehydration, Adult Rehydration is the replacement of body fluids, salts, and minerals (electrolytes) that are lost during dehydration. Dehydration is when there is not enough water or other  fluids in the body. This happens when you lose more fluids than you take in. Common causes of dehydration include: Not drinking enough fluids. This can occur when you are ill or doing activities that require a lot of energy, especially in hot weather. Conditions that cause loss of water or other fluids, such as diarrhea, vomiting, sweating, or urinating a lot. Other illnesses, such as fever or infection. Certain medicines, such as those that remove excess fluid from the body (diuretics). Symptoms of mild or moderate dehydration may include thirst, dry lips and mouth, and dizziness. Symptoms of severe dehydration may include increased heart rate, confusion, fainting, and not urinating. For severe dehydration, you may need to get fluids through an IV at the hospital. For mild or moderate dehydration, you can usually rehydrate at home by drinking certain fluids as told by your health care provider. What are the risks? Generally, rehydration is safe. However, taking in too much fluid (overhydration) can be a problem. This is rare. Overhydration can cause an electrolyte imbalance, kidney failure, or a decrease in salt (sodium) levels in the body. Supplies needed You will need an oral rehydration solution (ORS) if your health care provider tells you to use one. This is a drink to treat dehydration. It can be found in pharmacies and retail stores. How to rehydrate Fluids Follow instructions from your health care provider for rehydration. The kind of fluid and the amount you should drink depend on your condition. In general, you should choose drinks that you prefer. If told by your health care provider, drink an ORS. Make an ORS by following instructions on the package. Start by drinking small amounts, about  cup (120 mL) every 5-10 minutes. Slowly increase how much you drink until you have taken the amount recommended by your health care provider. Drink enough clear fluids to keep your urine pale yellow.  If you were told to drink an ORS, finish it first, then start slowly drinking other clear fluids. Drink fluids such as: Water. This includes sparkling water and flavored water. Drinking only water can lead to having too little sodium in your body (hyponatremia). Follow the advice of your health care provider. Water from ice chips you suck on. Fruit juice with water you add to it (diluted). Sports drinks. Hot or cold herbal teas. Broth-based soups. Milk or milk products. Food Follow instructions from your health care provider about what to eat while you rehydrate. Your health care provider may recommend that you slowly begin eating regular foods in small amounts. Eat foods that contain a healthy balance of electrolytes, such as bananas, oranges, potatoes, tomatoes, and spinach. Avoid foods that are greasy or contain a lot of sugar. In some cases, you may get nutrition through a feeding tube that is passed through your nose and into your stomach (nasogastric tube, or NG tube). This may be done if you have uncontrolled vomiting or diarrhea. Beverages to avoid Certain beverages may make dehydration worse. While you rehydrate, avoid drinking alcohol. How to tell if you are recovering from dehydration You may be recovering from dehydration if: You are urinating more often than before you started rehydrating. Your urine is pale yellow. Your energy level improves. You vomit less  frequently. You have diarrhea less frequently. Your appetite improves or returns to normal. You feel less dizzy or less light-headed. Your skin tone and color start to look more normal. Follow these instructions at home: Take over-the-counter and prescription medicines only as told by your health care provider. Do not take sodium tablets. Doing this can lead to having too much sodium in your body (hypernatremia). Contact a health care provider if: You continue to have symptoms of mild or moderate dehydration, such  as: Thirst. Dry lips. Slightly dry mouth. Dizziness. Dark urine or less urine than normal. Muscle cramps. You continue to vomit or have diarrhea. Get help right away if you: Have symptoms of dehydration that get worse. Have a fever. Have a severe headache. Have been vomiting and the following happens: Your vomiting gets worse or does not go away. Your vomit includes blood or green matter (bile). You cannot eat or drink without vomiting. Have problems with urination or bowel movements, such as: Diarrhea that gets worse or does not go away. Blood in your stool (feces). This may cause stool to look black and tarry. Not urinating, or urinating only a small amount of very dark urine, within 6-8 hours. Have trouble breathing. Have symptoms that get worse with treatment. These symptoms may represent a serious problem that is an emergency. Do not wait to see if the symptoms will go away. Get medical help right away. Call your local emergency services (911 in the [place] Do not drive yourself to the hospital. Summary Rehydration is the replacement of body fluids and minerals (electrolytes) that are lost during dehydration. Follow instructions from your health care provider for rehydration. The kind of fluid and amount you should drink depend on your condition. Slowly increase how much you drink until you have taken the amount recommended by your health care provider. Contact your health care provider if you continue to show signs of mild or moderate dehydration. This information is not intended to replace advice given to you by your health care provider. Make sure you discuss any questions you have with your health care provider. Document Revised: 06/05/2019 Document Reviewed: 04/15/2019 Elsevier Patient Education  2022 [employer].

## 2021-04-07 ENCOUNTER — Other Ambulatory Visit: Payer: Medicare Other

## 2021-04-08 ENCOUNTER — Telehealth: Payer: Medicare Other | Admitting: Hematology and Oncology

## 2021-04-14 ENCOUNTER — Telehealth: Payer: Self-pay

## 2021-04-14 NOTE — Telephone Encounter (Signed)
Attempt to return call to pt regarding upcoming appointment and labs.  No answer or VM

## 2021-04-15 ENCOUNTER — Other Ambulatory Visit: Payer: Self-pay | Admitting: *Deleted

## 2021-04-15 DIAGNOSIS — R5381 Other malaise: Secondary | ICD-10-CM

## 2021-04-15 DIAGNOSIS — D473 Essential (hemorrhagic) thrombocythemia: Secondary | ICD-10-CM

## 2021-04-16 ENCOUNTER — Inpatient Hospital Stay: Payer: Medicare Other

## 2021-04-16 ENCOUNTER — Other Ambulatory Visit: Payer: Self-pay

## 2021-04-16 DIAGNOSIS — D473 Essential (hemorrhagic) thrombocythemia: Secondary | ICD-10-CM | POA: Diagnosis not present

## 2021-04-16 DIAGNOSIS — R5383 Other fatigue: Secondary | ICD-10-CM

## 2021-04-16 DIAGNOSIS — Z79899 Other long term (current) drug therapy: Secondary | ICD-10-CM | POA: Diagnosis not present

## 2021-04-16 LAB — CBC WITH DIFFERENTIAL (CANCER CENTER ONLY)
Abs Immature Granulocytes: 0.02 10*3/uL (ref 0.00–0.07)
Basophils Absolute: 0 10*3/uL (ref 0.0–0.1)
Basophils Relative: 0 %
Eosinophils Absolute: 0.3 10*3/uL (ref 0.0–0.5)
Eosinophils Relative: 3 %
HCT: 37.2 % (ref 36.0–46.0)
Hemoglobin: 12.3 g/dL (ref 12.0–15.0)
Immature Granulocytes: 0 %
Lymphocytes Relative: 25 %
Lymphs Abs: 2.1 10*3/uL (ref 0.7–4.0)
MCH: 32.2 pg (ref 26.0–34.0)
MCHC: 33.1 g/dL (ref 30.0–36.0)
MCV: 97.4 fL (ref 80.0–100.0)
Monocytes Absolute: 0.6 10*3/uL (ref 0.1–1.0)
Monocytes Relative: 8 %
Neutro Abs: 5.5 10*3/uL (ref 1.7–7.7)
Neutrophils Relative %: 64 %
Platelet Count: 708 10*3/uL — ABNORMAL HIGH (ref 150–400)
RBC: 3.82 MIL/uL — ABNORMAL LOW (ref 3.87–5.11)
RDW: 14.4 % (ref 11.5–15.5)
WBC Count: 8.5 10*3/uL (ref 4.0–10.5)
nRBC: 0 % (ref 0.0–0.2)

## 2021-04-16 LAB — IRON AND IRON BINDING CAPACITY (CC-WL,HP ONLY)
Iron: 60 ug/dL (ref 28–170)
Saturation Ratios: 20 % (ref 10.4–31.8)
TIBC: 299 ug/dL (ref 250–450)
UIBC: 239 ug/dL

## 2021-04-16 LAB — FERRITIN: Ferritin: 11 ng/mL (ref 11–307)

## 2021-04-16 LAB — TSH: TSH: 0.843 u[IU]/mL (ref 0.308–3.960)

## 2021-04-16 MED ORDER — SODIUM CHLORIDE 0.9 % IV SOLN: Freq: Once | INTRAVENOUS | Status: AC

## 2021-04-16 NOTE — Patient Instructions (Signed)
Therapeutic Phlebotomy °Therapeutic phlebotomy is the planned removal of blood from a person's body for the purpose of treating a medical condition. The procedure is lot like donating blood. Usually, about a pint (470 mL, or 0.47 L) of blood is removed. The average adult has 9-12 pints (4.3-5.7 L) of blood in his or her body. °Therapeutic phlebotomy may be used to treat the following medical conditions: °Hemochromatosis. This is a condition in which the blood contains too much iron. °Polycythemia vera. This is a condition in which the blood contains too many red blood cells. °Porphyria cutanea tarda. This is a disease in which an important part of hemoglobin is not made properly. It results in the buildup of abnormal amounts of porphyrins in the body. °Sickle cell disease. This is a condition in which the red blood cells form an abnormal crescent shape rather than a round shape. °Tell a health care provider about: °Any allergies you have. °All medicines you are taking, including vitamins, herbs, eye drops, creams, and over-the-counter medicines. °Any bleeding problems you have. °Any surgeries you have had. °Any medical conditions you have. °Whether you are pregnant or may be pregnant. °What are the risks? °Generally, this is a safe procedure. However, problems may occur, including: °Nausea or light-headedness. °Low blood pressure (hypotension). °Soreness, bleeding, swelling, or bruising at the needle insertion site. °Infection. °What happens before the procedure? °Ask your health care provider about: °Changing or stopping your regular medicines. This is especially important if you are taking diabetes medicines or blood thinners. °Taking medicines such as aspirin and ibuprofen. These medicines can thin your blood. Do not take these medicines unless your health care provider tells you to take them. °Taking over-the-counter medicines, vitamins, herbs, and supplements. °Wear clothing with sleeves that can be raised  above the elbow. °You may have a blood sample taken. °Your blood pressure, pulse rate, and breathing rate will be measured. °What happens during the procedure? ° °You may be given a medicine to numb the area (local anesthetic). °A tourniquet will be placed on your arm. °A needle will be put into one of your veins. °Tubing and a collection bag will be attached to the needle. °Blood will flow through the needle and tubing into the collection bag. °The collection bag will be placed lower than your arm so gravity can help the blood flow into the bag. °You may be asked to open and close your hand slowly and continually during the entire collection. °After the specified amount of blood has been removed from your body, the collection bag and tubing will be clamped. °The needle will be removed from your vein. °Pressure will be held on the needle site to stop the bleeding. °A bandage (dressing) will be placed over the needle insertion site. °The procedure may vary among health care providers and hospitals. °What happens after the procedure? °Your blood pressure, pulse rate, and breathing rate will be measured after the procedure. °You will be encouraged to drink fluids. °You will be encouraged to eat a snack to prevent a low blood sugar level. °Your recovery will be assessed and monitored. °Return to your normal activities as told by your health care provider. °Summary °Therapeutic phlebotomy is the planned removal of blood from a person's body for the purpose of treating a medical condition. °Therapeutic phlebotomy may be used to treat hemochromatosis, polycythemia vera, porphyria cutanea tarda, or sickle cell disease. °In the procedure, a needle is inserted and about a pint (470 mL, or 0.47 L) of blood is   removed. The average adult has 9-12 pints (4.3-5.7 L) of blood in the body. °This is generally a safe procedure, but it can sometimes cause problems such as nausea, light-headedness, or low blood pressure  (hypotension). °This information is not intended to replace advice given to you by your health care provider. Make sure you discuss any questions you have with your health care provider. °Document Revised: 09/30/2020 Document Reviewed: 09/30/2020 °Elsevier Patient Education © 2022 [employer]vier Inc. ° °

## 2021-04-16 NOTE — Progress Notes (Signed)
Kara [name] presents today for phlebotomy per MD orders. Phlebotomy procedure started at 1513 and ended at 1525. 506 grams removed. Patient observed for 30 minutes after procedure without any incident. Patient tolerated procedure well. Fluids given per order post phlebotomy. IV needle removed intact.

## 2021-04-21 NOTE — Progress Notes (Signed)
°  HEMATOLOGY-ONCOLOGY TELEPHONE VISIT PROGRESS NOTE  I connected with [name] on 04/22/2021 at  8:45 AM EST by telephone and verified that I am speaking with the correct person using two identifiers.  I discussed the limitations, risks, security and privacy concerns of performing an evaluation and management service by telephone and the availability of in person appointments.  I also discussed with the patient that there may be a patient responsible charge related to this service. The patient expressed understanding and agreed to proceed.   History of Present Illness: Kara [name] is a 71 y.o. female with above-mentioned history of thrombocytosis and hereditary hemochromatosis who is currently on 81mg  Aspirin, hydroxyurea, and has received phlebotomies (last 04/17/21). She presents via telephone today for follow-up. She complains of continued problems with fatigue.  She has been having a hard time tolerating Hydrea because it causes drooling and the dryness symptoms.  We have tried to get her anagrelide but it was too expensive through her insurance.  Observations/Objective:     Assessment Plan:  Hemochromatosis 03/24/2017:Ferritin 10, iron saturation 10% with a hemoglobin of 13.4 07/12/2017: Ferritin 18, iron saturation 66% with a hemoglobin of 14.8 05/09/2018: Ferritin: 35, iron saturation 68%, hemoglobin 15.6, platelets 899 (increased from 650) 12/18/2018: Ferritin 60, iron saturation 67%, hemoglobin 15, platelet count 993 08/12/2019: Ferritin 21, iron saturation 56%, hemoglobin 14.1, platelets 484 04/28/2020: Hemoglobin 12.5, ferritin 5, iron saturation 18%, platelets 594 10/13/2020: Hemoglobin 13.2, platelets 580, iron saturation 76%, ferritin 42 02/19/2021: Hemoglobin 12.9, platelets 612, iron saturation 59%, ferritin 24 04/16/2021: Hemoglobin 12.3, platelets 708, iron saturation 20%, ferritin 11   It appears that the phlebotomy had worked in reducing the iron saturation. We will perform 1  more phlebotomy and repeat the blood work.   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 She feels miserable taking hydroxyurea. We were able to find good Rx coupon that would get anagrelide price and significantly.  I sent a new prescription to [facility].   I discussed the assessment and treatment plan with the patient. The patient was provided an opportunity to ask questions and all were answered. The patient agreed with the plan and demonstrated an understanding of the instructions. The patient was advised to call back or seek an in-person evaluation if the symptoms worsen or if the condition fails to improve as anticipated.   Total time spent: 22 mins including non-face to face time and time spent for planning, charting and coordination of care  Rulon Eisenmenger, MD 04/22/2021    I, Thana Ates, am acting as scribe for Nicholas Lose, MD.  I have reviewed the above documentation for accuracy and completeness, and I agree with the above.

## 2021-04-22 ENCOUNTER — Inpatient Hospital Stay: Payer: Medicare Other | Attending: Hematology and Oncology | Admitting: Hematology and Oncology

## 2021-04-22 DIAGNOSIS — D75839 Thrombocytosis, unspecified: Secondary | ICD-10-CM | POA: Insufficient documentation

## 2021-04-22 DIAGNOSIS — D473 Essential (hemorrhagic) thrombocythemia: Secondary | ICD-10-CM | POA: Diagnosis not present

## 2021-04-22 MED ORDER — ANAGRELIDE HCL 1 MG PO CAPS: 1.0000 mg | ORAL_CAPSULE | Freq: Two times a day (BID) | ORAL | 3 refills | Status: DC

## 2021-04-22 NOTE — Assessment & Plan Note (Signed)
03/24/2017:Ferritin10, iron saturation10% with a hemoglobin of 13.4 07/12/2017: Ferritin 18, iron saturation 66% with a hemoglobin of 14.8 05/09/2018: Ferritin: 35, iron saturation 68%, hemoglobin 15.6, platelets 899 (increased from 650) 12/18/2018: Ferritin 60, iron saturation 67%, hemoglobin 15, platelet count 993 08/12/2019: Ferritin 21, iron saturation 56%, hemoglobin 14.1, platelets 484 04/28/2020: Hemoglobin 12.5, ferritin 5, iron saturation 18%, platelets 594 10/13/2020: Hemoglobin 13.2, platelets 580, iron saturation 76%, ferritin 42 02/19/2021: Hemoglobin 12.9, platelets 612, iron saturation 59%, ferritin 24 04/16/2021: Hemoglobin 12.3, platelets 708, iron saturation 20%, ferritin 11   It appears that the phlebotomy had worked in reducing the iron saturation. Therefore we can now watch with recheck of labs in 3 months.

## 2021-04-22 NOTE — Assessment & Plan Note (Signed)
Lab review: Platelet count 708: Currently on hydroxyurea 500 mg daily. Platelet count has remained stable.  Because she gets fatigued and complains of dryness, we decided to keep the dosage the same.

## 2021-04-23 ENCOUNTER — Telehealth: Payer: Self-pay | Admitting: Hematology and Oncology

## 2021-04-23 NOTE — Telephone Encounter (Signed)
Scheduled appointment per 01/05 los. Patient aware. °

## 2021-04-28 ENCOUNTER — Telehealth: Payer: Self-pay | Admitting: *Deleted

## 2021-04-28 NOTE — Assessment & Plan Note (Signed)
Lab review: Platelet count 708: Currently on hydroxyurea 500 mg daily. She feels miserable taking hydroxyurea. We were able to find good Rx coupon that would get anagrelide price and significantly.  I sent a new prescription to [facility].

## 2021-04-28 NOTE — Assessment & Plan Note (Signed)
03/24/2017:Ferritin10, iron saturation10% with a hemoglobin of 13.4 07/12/2017: Ferritin 18, iron saturation 66% with a hemoglobin of 14.8 05/09/2018: Ferritin: 35, iron saturation 68%, hemoglobin 15.6, platelets 899 (increased from 650) 12/18/2018: Ferritin 60, iron saturation 67%, hemoglobin 15, platelet count 993 08/12/2019: Ferritin 21, iron saturation 56%, hemoglobin 14.1, platelets 484 04/28/2020: Hemoglobin 12.5, ferritin 5, iron saturation 18%, platelets 594 10/13/2020: Hemoglobin 13.2, platelets 580, iron saturation 76%, ferritin 42 02/19/2021: Hemoglobin 12.9, platelets 612, iron saturation 59%, ferritin 24 04/16/2021: Hemoglobin 12.3, platelets 708, iron saturation 20%, ferritin 11   It appears that the phlebotomy had worked in reducing the iron saturation. We will perform 1 more phlebotomy and repeat the blood work.

## 2021-04-28 NOTE — Telephone Encounter (Signed)
Received call from pt requesting telephone visit with MD regarding excessive saliva production.  Appt scheduled and pt verbalized understanding of date and time.

## 2021-04-29 ENCOUNTER — Inpatient Hospital Stay (HOSPITAL_BASED_OUTPATIENT_CLINIC_OR_DEPARTMENT_OTHER): Payer: Medicare Other | Admitting: Hematology and Oncology

## 2021-04-29 DIAGNOSIS — D473 Essential (hemorrhagic) thrombocythemia: Secondary | ICD-10-CM | POA: Diagnosis not present

## 2021-04-29 NOTE — Progress Notes (Signed)
HEMATOLOGY-ONCOLOGY TELEPHONE VISIT PROGRESS NOTE  I connected with @PTNAME @ on 04/29/21 at  9:15 AM EST by telephone and verified that I am speaking with the correct person using two identifiers.  I discussed the limitations, risks, security and privacy concerns of performing an evaluation and management service by telephone and the availability of in person appointments.  I also discussed with the patient that there may be a patient responsible charge related to this service. The patient expressed understanding and agreed to proceed.   History of Present Illness: Stopped Hydrea. Unhappy that the dryness and drooling haven't stopped.    REVIEW OF SYSTEMS:   Constitutional: Denies fevers, chills or abnormal weight loss Eyes: Denies blurriness of vision Ears, nose, mouth, throat, and face: Drooling Respiratory: Denies cough, dyspnea or wheezes Cardiovascular: Denies palpitation, chest discomfort Gastrointestinal:  Denies nausea, heartburn or change in bowel habits Skin: Denies abnormal skin rashes Lymphatics: Denies new lymphadenopathy or easy bruising Neurological:Denies numbness, tingling or new weaknesses Behavioral/Psych: Mood is stable, no new changes  Extremities: No lower extremity edema   All other systems were reviewed with the patient and are negative. Observations/Objective:     Assessment Plan:  Essential thrombocytosis ([place]) Lab review: Platelet count 708: Stopped Hydrea, switched to Anagrelide Jan 2023 She felt miserable taking hydroxyurea. She will be off for another week and then take anagrelide.  She has a rhinoplasty surgery coming up.    Hemochromatosis 03/24/2017:Ferritin 10, iron saturation 10% with a hemoglobin of 13.4 07/12/2017: Ferritin 18, iron saturation 66% with a hemoglobin of 14.8 05/09/2018: Ferritin: 35, iron saturation 68%, hemoglobin 15.6, platelets 899 (increased from 650) 12/18/2018: Ferritin 60, iron saturation 67%, hemoglobin 15, platelet count  993 08/12/2019: Ferritin 21, iron saturation 56%, hemoglobin 14.1, platelets 484 04/28/2020: Hemoglobin 12.5, ferritin 5, iron saturation 18%, platelets 594 10/13/2020: Hemoglobin 13.2, platelets 580, iron saturation 76%, ferritin 42 02/19/2021: Hemoglobin 12.9, platelets 612, iron saturation 59%, ferritin 24 04/16/2021: Hemoglobin 12.3, platelets 708, iron saturation 20%, ferritin 11   It appears that the phlebotomy had worked in reducing the iron saturation. We will perform 1 more phlebotomy and repeat the blood work.   I discussed the assessment and treatment plan with the patient. The patient was provided an opportunity to ask questions and all were answered. The patient agreed with the plan and demonstrated an understanding of the instructions. The patient was advised to call back or seek an in-person evaluation if the symptoms worsen or if the condition fails to improve as anticipated.   I provided 15 minutes of non-face-to-face time during this encounter. Harriette Ohara, MD

## 2021-04-30 ENCOUNTER — Telehealth: Payer: Self-pay | Admitting: *Deleted

## 2021-04-30 NOTE — Telephone Encounter (Signed)
Received call from pt stating she is experiencing symptoms of facial contact dermatitis related to excessive usage of facial masks with the Covid 19 pandemic.  Pt alerted our office that she is going to follow up with a dermatologist at [facility] for further evaluation and treatment.

## 2021-05-07 ENCOUNTER — Inpatient Hospital Stay: Payer: Medicare Other

## 2021-05-11 ENCOUNTER — Inpatient Hospital Stay: Payer: Medicare Other

## 2021-05-11 ENCOUNTER — Other Ambulatory Visit: Payer: Self-pay | Admitting: *Deleted

## 2021-05-11 ENCOUNTER — Other Ambulatory Visit: Payer: Self-pay

## 2021-05-11 DIAGNOSIS — D473 Essential (hemorrhagic) thrombocythemia: Secondary | ICD-10-CM

## 2021-05-11 DIAGNOSIS — D75839 Thrombocytosis, unspecified: Secondary | ICD-10-CM | POA: Diagnosis not present

## 2021-05-11 LAB — CBC WITH DIFFERENTIAL (CANCER CENTER ONLY)
Abs Immature Granulocytes: 0.03 10*3/uL (ref 0.00–0.07)
Basophils Absolute: 0.1 10*3/uL (ref 0.0–0.1)
Basophils Relative: 1 %
Eosinophils Absolute: 0.3 10*3/uL (ref 0.0–0.5)
Eosinophils Relative: 3 %
HCT: 36.2 % (ref 36.0–46.0)
Hemoglobin: 11.8 g/dL — ABNORMAL LOW (ref 12.0–15.0)
Immature Granulocytes: 0 %
Lymphocytes Relative: 24 %
Lymphs Abs: 2.2 10*3/uL (ref 0.7–4.0)
MCH: 30.9 pg (ref 26.0–34.0)
MCHC: 32.6 g/dL (ref 30.0–36.0)
MCV: 94.8 fL (ref 80.0–100.0)
Monocytes Absolute: 0.6 10*3/uL (ref 0.1–1.0)
Monocytes Relative: 7 %
Neutro Abs: 6 10*3/uL (ref 1.7–7.7)
Neutrophils Relative %: 65 %
Platelet Count: 731 10*3/uL — ABNORMAL HIGH (ref 150–400)
RBC: 3.82 MIL/uL — ABNORMAL LOW (ref 3.87–5.11)
RDW: 13.7 % (ref 11.5–15.5)
WBC Count: 9.2 10*3/uL (ref 4.0–10.5)
nRBC: 0 % (ref 0.0–0.2)

## 2021-05-11 LAB — IRON AND IRON BINDING CAPACITY (CC-WL,HP ONLY)
Iron: 46 ug/dL (ref 28–170)
Saturation Ratios: 13 % (ref 10.4–31.8)
TIBC: 346 ug/dL (ref 250–450)
UIBC: 300 ug/dL (ref 148–442)

## 2021-05-11 MED ORDER — SODIUM CHLORIDE 0.9 % IV SOLN: Freq: Once | INTRAVENOUS | Status: AC

## 2021-05-11 NOTE — Patient Instructions (Signed)
Therapeutic Phlebotomy, Care After The following information offers guidance on how to care for yourself after your procedure. Your health care provider may also give you more specific instructions. If you have problems or questions, contact your health care provider. What can I expect after the procedure? After therapeutic phlebotomy, it is common to have: Light-headedness or dizziness. You may feel faint. Nausea. Tiredness (fatigue). Follow these instructions at home: Eating and drinking Be sure to eat well-balanced meals for the next 24 hours. Drink enough fluid to keep your urine pale yellow. Avoid drinking alcohol on the day that you had the procedure. Activity  Return to your normal activities as told by your health care provider. Most people can go back to their normal activities right away. Avoid activities that take a lot of effort for about 5 hours after the procedure. Athletes should avoid strenuous exercise for at least 12 hours. Avoid heavy lifting or pulling for about 5 hours after the procedure. Do not lift anything that is heavier than 10 lb (4.5 kg). Change positions slowly for the remainder of the day, like from sitting to standing. This can help prevent light-headedness or fainting. If you feel light-headed, lie down until the feeling goes away. Needle insertion site care  Keep your bandage (dressing) dry. You can remove the bandage after about 5 hours or as told by your health care provider. If you have bleeding from the needle insertion site, raise (elevate) your arm and press firmly on the site until the bleeding stops. If you have bruising at the site, apply ice to the area. To do this: Put ice in a plastic bag. Place a towel between your skin and the bag. Leave the ice on for 20 minutes, 2-3 times a day for the first 24 hours. Remove the ice if your skin turns bright red so you do not damage the area. If the swelling does not go away after 24 hours, apply a warm,  moist cloth (warm compress) to the area for 20 minutes, 2-3 times a day. General instructions Do not use any products that contain nicotine or tobacco, like cigarettes, chewing tobacco, and vaping devices, such as e-cigarettes, for at least 30 minutes after the procedure. If you need help quitting, ask your health care provider. Keep all follow-up visits. You may need to continue having regular blood tests and therapeutic phlebotomy treatments as directed. Contact a health care provider if: You have redness, swelling, or pain at the needle insertion site. Fluid or blood is coming from the needle insertion site. Pus or a bad smell is coming from the needle insertion site. The needle insertion site feels warm to the touch. You feel light-headed, dizzy, or nauseous, and the feeling does not go away. You have new bruising at the needle insertion site. You feel weaker than normal. You have a fever or chills. Get help right away if: You have chest pain. You have trouble breathing. You have severe nausea or vomiting. Summary After the procedure, it is common to have some light-headedness, dizziness, nausea, or tiredness (fatigue). Be sure to eat well-balanced meals for the next 24 hours. Drink enough fluid to keep your urine pale yellow. Return to your normal activities as told by your health care provider. Keep all follow-up visits. You may need to continue having regular blood tests and therapeutic phlebotomy treatments as directed. This information is not intended to replace advice given to you by your health care provider. Make sure you discuss any questions you have  with your health care provider. Rehydration, Adult Rehydration is the replacement of body fluids, salts, and minerals (electrolytes) that are lost during dehydration. Dehydration is when there is not enough water or other fluids in the body. This happens when you lose more fluids than you take in. Common causes of dehydration  include: Not drinking enough fluids. This can occur when you are ill or doing activities that require a lot of energy, especially in hot weather. Conditions that cause loss of water or other fluids, such as diarrhea, vomiting, sweating, or urinating a lot. Other illnesses, such as fever or infection. Certain medicines, such as those that remove excess fluid from the body (diuretics). Symptoms of mild or moderate dehydration may include thirst, dry lips and mouth, and dizziness. Symptoms of severe dehydration may include increased heart rate, confusion, fainting, and not urinating. For severe dehydration, you may need to get fluids through an IV at the hospital. For mild or moderate dehydration, you can usually rehydrate at home by drinking certain fluids as told by your health care provider. What are the risks? Generally, rehydration is safe. However, taking in too much fluid (overhydration) can be a problem. This is rare. Overhydration can cause an electrolyte imbalance, kidney failure, or a decrease in salt (sodium) levels in the body. Supplies needed You will need an oral rehydration solution (ORS) if your health care provider tells you to use one. This is a drink to treat dehydration. It can be found in pharmacies and retail stores. How to rehydrate Fluids Follow instructions from your health care provider for rehydration. The kind of fluid and the amount you should drink depend on your condition. In general, you should choose drinks that you prefer. If told by your health care provider, drink an ORS. Make an ORS by following instructions on the package. Start by drinking small amounts, about  cup (120 mL) every 5-10 minutes. Slowly increase how much you drink until you have taken the amount recommended by your health care provider. Drink enough clear fluids to keep your urine pale yellow. If you were told to drink an ORS, finish it first, then start slowly drinking other clear fluids. Drink  fluids such as: Water. This includes sparkling water and flavored water. Drinking only water can lead to having too little sodium in your body (hyponatremia). Follow the advice of your health care provider. Water from ice chips you suck on. Fruit juice with water you add to it (diluted). Sports drinks. Hot or cold herbal teas. Broth-based soups. Milk or milk products. Food Follow instructions from your health care provider about what to eat while you rehydrate. Your health care provider may recommend that you slowly begin eating regular foods in small amounts. Eat foods that contain a healthy balance of electrolytes, such as bananas, oranges, potatoes, tomatoes, and spinach. Avoid foods that are greasy or contain a lot of sugar. In some cases, you may get nutrition through a feeding tube that is passed through your nose and into your stomach (nasogastric tube, or NG tube). This may be done if you have uncontrolled vomiting or diarrhea. Beverages to avoid Certain beverages may make dehydration worse. While you rehydrate, avoid drinking alcohol. How to tell if you are recovering from dehydration You may be recovering from dehydration if: You are urinating more often than before you started rehydrating. Your urine is pale yellow. Your energy level improves. You vomit less frequently. You have diarrhea less frequently. Your appetite improves or returns to normal. You feel  less dizzy or less light-headed. Your skin tone and color start to look more normal. Follow these instructions at home: Take over-the-counter and prescription medicines only as told by your health care provider. Do not take sodium tablets. Doing this can lead to having too much sodium in your body (hypernatremia). Contact a health care provider if: You continue to have symptoms of mild or moderate dehydration, such as: Thirst. Dry lips. Slightly dry mouth. Dizziness. Dark urine or less urine than normal. Muscle  cramps. You continue to vomit or have diarrhea. Get help right away if you: Have symptoms of dehydration that get worse. Have a fever. Have a severe headache. Have been vomiting and the following happens: Your vomiting gets worse or does not go away. Your vomit includes blood or green matter (bile). You cannot eat or drink without vomiting. Have problems with urination or bowel movements, such as: Diarrhea that gets worse or does not go away. Blood in your stool (feces). This may cause stool to look black and tarry. Not urinating, or urinating only a small amount of very dark urine, within 6-8 hours. Have trouble breathing. Have symptoms that get worse with treatment. These symptoms may represent a serious problem that is an emergency. Do not wait to see if the symptoms will go away. Get medical help right away. Call your local emergency services (911 in the [place] Do not drive yourself to the hospital. Summary Rehydration is the replacement of body fluids and minerals (electrolytes) that are lost during dehydration. Follow instructions from your health care provider for rehydration. The kind of fluid and amount you should drink depend on your condition. Slowly increase how much you drink until you have taken the amount recommended by your health care provider. Contact your health care provider if you continue to show signs of mild or moderate dehydration. This information is not intended to replace advice given to you by your health care provider. Make sure you discuss any questions you have with your health care provider. Document Revised: 06/05/2019 Document Reviewed: 04/15/2019 Elsevier Patient Education  2022 [place] Revised: 09/30/2020 Document Reviewed: 09/30/2020 Elsevier Patient Education  2022 [employer].

## 2021-05-11 NOTE — Progress Notes (Signed)
Per Dr. Lindi Adie, ok for Phlebotomy today with HCT 36.2 and Ferritin and Iron panel labs pending.   Kara [name] presents today for phlebotomy per MD orders. Phlebotomy procedure started at: 1558 and ended at 1610, 500 grams removed. Pt. complained of nausea and pt. was diaphoretic at the end of the treatment. IV fluids started per orders. Food and fluids offered and fluids taken.  Patient observed for 30 minutes after procedure without any incident. Patient tolerated procedure well. IV needle removed intact.

## 2021-05-12 LAB — FERRITIN: Ferritin: 7 ng/mL — ABNORMAL LOW (ref 11–307)

## 2021-05-14 ENCOUNTER — Inpatient Hospital Stay: Payer: Medicare Other

## 2021-05-14 ENCOUNTER — Encounter: Payer: Self-pay | Admitting: Hematology and Oncology

## 2021-05-17 ENCOUNTER — Inpatient Hospital Stay: Payer: Medicare Other | Admitting: Hematology and Oncology

## 2021-05-18 ENCOUNTER — Encounter: Payer: Self-pay | Admitting: *Deleted

## 2021-05-18 NOTE — Progress Notes (Signed)
Received surgical clearance authorization from Dr. Jacques Navy.  Signed by MD and successfully faxed back [phone]).

## 2021-05-19 ENCOUNTER — Inpatient Hospital Stay: Payer: Medicare Other | Attending: Hematology and Oncology

## 2021-05-19 DIAGNOSIS — D473 Essential (hemorrhagic) thrombocythemia: Secondary | ICD-10-CM | POA: Insufficient documentation

## 2021-05-20 ENCOUNTER — Other Ambulatory Visit: Payer: Self-pay

## 2021-05-20 ENCOUNTER — Inpatient Hospital Stay: Payer: Medicare Other

## 2021-05-20 DIAGNOSIS — D473 Essential (hemorrhagic) thrombocythemia: Secondary | ICD-10-CM | POA: Diagnosis not present

## 2021-05-20 DIAGNOSIS — L308 Other specified dermatitis: Secondary | ICD-10-CM | POA: Diagnosis not present

## 2021-05-20 LAB — CBC WITH DIFFERENTIAL (CANCER CENTER ONLY)
Abs Immature Granulocytes: 0.03 K/uL (ref 0.00–0.07)
Basophils Absolute: 0.1 K/uL (ref 0.0–0.1)
Basophils Relative: 1 %
Eosinophils Absolute: 0.4 K/uL (ref 0.0–0.5)
Eosinophils Relative: 4 %
HCT: 32.6 % — ABNORMAL LOW (ref 36.0–46.0)
Hemoglobin: 10.5 g/dL — ABNORMAL LOW (ref 12.0–15.0)
Immature Granulocytes: 0 %
Lymphocytes Relative: 25 %
Lymphs Abs: 2.5 K/uL (ref 0.7–4.0)
MCH: 30.4 pg (ref 26.0–34.0)
MCHC: 32.2 g/dL (ref 30.0–36.0)
MCV: 94.5 fL (ref 80.0–100.0)
Monocytes Absolute: 0.8 K/uL (ref 0.1–1.0)
Monocytes Relative: 8 %
Neutro Abs: 6.3 K/uL (ref 1.7–7.7)
Neutrophils Relative %: 62 %
Platelet Count: 778 K/uL — ABNORMAL HIGH (ref 150–400)
RBC: 3.45 MIL/uL — ABNORMAL LOW (ref 3.87–5.11)
RDW: 13.6 % (ref 11.5–15.5)
WBC Count: 10 K/uL (ref 4.0–10.5)
nRBC: 0 % (ref 0.0–0.2)

## 2021-05-20 LAB — IRON AND IRON BINDING CAPACITY (CC-WL,HP ONLY)
Iron: 26 ug/dL — ABNORMAL LOW (ref 28–170)
Saturation Ratios: 8 % — ABNORMAL LOW (ref 10.4–31.8)
TIBC: 333 ug/dL (ref 250–450)
UIBC: 307 ug/dL (ref 148–442)

## 2021-05-21 LAB — FERRITIN: Ferritin: 5 ng/mL — ABNORMAL LOW (ref 11–307)

## 2021-05-22 NOTE — Progress Notes (Signed)
°  HEMATOLOGY-ONCOLOGY TELEPHONE VISIT PROGRESS NOTE  I connected with Kara [name] on 05/24/2021 at  8:15 AM EST by telephone and verified that I am speaking with the correct person using two identifiers.  I discussed the limitations, risks, security and privacy concerns of performing an evaluation and management service by telephone and the availability of in person appointments.  I also discussed with the patient that there may be a patient responsible charge related to this service. The patient expressed understanding and agreed to proceed.   History of Present Illness: Kara [name] is a 71 y.o. female with above-mentioned history of thrombocytosis and hereditary hemochromatosis who is currently on 81mg  Aspirin, Anagrelide, and has received phlebotomies (last 05/12/21). She presents via telephone today for follow-up.  She does feel that the appointments with dermatology and plastic surgery being done and it appears that she has a fungal infection which is going to need to be treated with Diflucan.  However because anagrelide and Diflucan have an interaction they want [place] to see what can be done about it.  She just started the anagrelide on 05/06/2021.  Observations/Objective:     Assessment Plan:  Hemochromatosis 03/24/2017:Ferritin 10, iron saturation 10% with a hemoglobin of 13.4 07/12/2017: Ferritin 18, iron saturation 66% with a hemoglobin of 14.8 05/09/2018: Ferritin: 35, iron saturation 68%, hemoglobin 15.6, platelets 899 (increased from 650) 12/18/2018: Ferritin 60, iron saturation 67%, hemoglobin 15, platelet count 993 08/12/2019: Ferritin 21, iron saturation 56%, hemoglobin 14.1, platelets 484 04/28/2020: Hemoglobin 12.5, ferritin 5, iron saturation 18%, platelets 594 10/13/2020: Hemoglobin 13.2, platelets 580, iron saturation 76%, ferritin 42 02/19/2021: Hemoglobin 12.9, platelets 612, iron saturation 59%, ferritin 24 04/16/2021: Hemoglobin 12.3, platelets 708, iron saturation 20%, ferritin  11 05/20/2020: Hemoglobin 10.5, platelets 778, iron saturation 8%, ferritin 5  No plans to do any further phlebotomies.   Essential thrombocytosis ([name]) Current treatment: Anagrelide started 05/06/21 (Prior treatment hydroxyurea: Discontinued because of side effects including drooling around the mouth) Part of the increase in the platelet count could also be a reactive thrombocytosis from the profound iron deficiency be caused by the phlebotomies that we had done on her.  In spite of switching her from Hydrea to anagrelide, somehow her symptoms did not improve. According to dermatology: Could be yeast infection. She is going to be treated with diflucan. Holding anagrelide until diflucan will be done.  (Prolonged QT interval is adverse effects)  Labs and follow-up every 2 months.  I discussed the assessment and treatment plan with the patient. The patient was provided an opportunity to ask questions and all were answered. The patient agreed with the plan and demonstrated an understanding of the instructions. The patient was advised to call back or seek an in-person evaluation if the symptoms worsen or if the condition fails to improve as anticipated.   Total time spent: 12 mins including non-face to face time and time spent for planning, charting and coordination of care  Rulon Eisenmenger, MD 05/24/2021    I, Thana Ates, am acting as scribe for Nicholas Lose, MD.  I have reviewed the above documentation for accuracy and completeness, and I agree with the above.

## 2021-05-24 ENCOUNTER — Inpatient Hospital Stay (HOSPITAL_BASED_OUTPATIENT_CLINIC_OR_DEPARTMENT_OTHER): Payer: Medicare Other | Admitting: Hematology and Oncology

## 2021-05-24 DIAGNOSIS — D473 Essential (hemorrhagic) thrombocythemia: Secondary | ICD-10-CM | POA: Diagnosis not present

## 2021-05-24 NOTE — Assessment & Plan Note (Signed)
Current treatment: Anagrelide (Prior treatment hydroxyurea: Discontinued because of side effects including drooling around the mouth) We may have to increase the dosage of anagrelide

## 2021-05-24 NOTE — Assessment & Plan Note (Signed)
03/24/2017:Ferritin10, iron saturation10% with a hemoglobin of 13.4 07/12/2017: Ferritin 18, iron saturation 66% with a hemoglobin of 14.8 05/09/2018: Ferritin: 35, iron saturation 68%, hemoglobin 15.6, platelets 899 (increased from 650) 12/18/2018: Ferritin 60, iron saturation 67%, hemoglobin 15, platelet count 993 08/12/2019: Ferritin 21, iron saturation 56%, hemoglobin 14.1, platelets 484 04/28/2020: Hemoglobin 12.5, ferritin 5, iron saturation 18%, platelets 594 10/13/2020: Hemoglobin 13.2, platelets 580, iron saturation 76%, ferritin 42 02/19/2021: Hemoglobin 12.9, platelets 612, iron saturation 59%, ferritin 24 04/16/2021: Hemoglobin 12.3, platelets 708, iron saturation 20%, ferritin 11 05/20/2020: Hemoglobin 10.5, platelets 778, iron saturation 8%, ferritin 5  No plans to do any further phlebotomies. Labs and follow-up every 2 months.

## 2021-05-25 ENCOUNTER — Telehealth: Payer: Self-pay | Admitting: Hematology and Oncology

## 2021-05-25 NOTE — Telephone Encounter (Signed)
Scheduled appointment per 2/6 los. Patient is aware. Patient will be mailed an updated calendar.

## 2021-06-03 ENCOUNTER — Ambulatory Visit (INDEPENDENT_AMBULATORY_CARE_PROVIDER_SITE_OTHER): Payer: Medicare Other | Admitting: Orthopedic Surgery

## 2021-06-03 ENCOUNTER — Ambulatory Visit: Payer: Medicare Other | Admitting: Orthopedic Surgery

## 2021-06-03 ENCOUNTER — Other Ambulatory Visit: Payer: Self-pay

## 2021-06-03 DIAGNOSIS — M2022 Hallux rigidus, left foot: Secondary | ICD-10-CM | POA: Diagnosis not present

## 2021-06-06 ENCOUNTER — Encounter: Payer: Self-pay | Admitting: Orthopedic Surgery

## 2021-06-06 NOTE — Progress Notes (Signed)
Office Visit Note   Patient: Kara [name]           Date of Birth: 11/08/1950           MRN: [id number] Visit Date: 06/03/2021              Requested by: No referring provider defined for this encounter. PCP: Patient, No Pcp Per (Inactive)  Chief Complaint  Patient presents with   Right Foot - Pain   Left Foot - Pain      HPI: Patient is a 71 year old woman with a history of bilateral foot pain with hallux rigidus on the left.  She states she was considering surgery in July of last year.  Assessment & Plan: Visit Diagnoses:  1. Hallux rigidus, left foot     Plan: Patient currently has no pain with her hallux rigidus of the great toes bilaterally.  Recommended arch support orthotics stiff soled sneakers follow-up with the foot pain reoccurs.  Follow-Up Instructions: Return if symptoms worsen or fail to improve.   Ortho Exam  Patient is alert, oriented, no adenopathy, well-dressed, normal affect, normal respiratory effort. Examination patient has good pulses bilaterally.  She has a bony spur dorsally over the MTP joint of both great toes worse on the left than the right.  There is no pain with range of motion she has good dorsiflexion of the ankle past neutral.  The plantar fascia is nontender to palpation the posterior tibial tendon functions well.  No pain over the peroneal tendons.  Imaging: No results found. No images are attached to the encounter.  Labs: Lab Results  Component Value Date   HGBA1C 5.4 04/28/2016   ESRSEDRATE 2 04/28/2016     Lab Results  Component Value Date   ALBUMIN 4.4 01/12/2021   ALBUMIN 4.3 12/15/2020   ALBUMIN 4.1 10/13/2020    No results found for: MG No results found for: VD25OH  No results found for: PREALBUMIN CBC EXTENDED Latest Ref Rng & Units 05/20/2021 05/11/2021 04/16/2021  WBC 4.0 - 10.5 K/uL 10.0 9.2 8.5  RBC 3.87 - 5.11 MIL/uL 3.45(L) 3.82(L) 3.82(L)  HGB 12.0 - 15.0 g/dL 10.5(L) 11.8(L) 12.3  HCT 36.0 - 46.0 % 32.6(L)  36.2 37.2  PLT 150 - 400 K/uL 778(H) 731(H) 708(H)  NEUTROABS 1.7 - 7.7 K/uL 6.3 6.0 5.5  LYMPHSABS 0.7 - 4.0 K/uL 2.5 2.2 2.1     There is no height or weight on file to calculate BMI.  Orders:  No orders of the defined types were placed in this encounter.  No orders of the defined types were placed in this encounter.    Procedures: No procedures performed  Clinical Data: No additional findings.  ROS:  All other systems negative, except as noted in the HPI. Review of Systems  Objective: Vital Signs: There were no vitals taken for this visit.  Specialty Comments:  No specialty comments available.  PMFS History: Patient Active Problem List   Diagnosis Date Noted   Essential thrombocytosis ([place]) 05/22/2018   Chronic migraine 11/21/2017   Hemochromatosis 06/01/2016   Benign paroxysmal positional vertigo 05/20/2016   Orthostatic hypotension 05/20/2016   Nasal obstruction 05/19/2016   Light headedness 04/28/2016   Screening for hyperlipidemia 04/28/2016   Past Medical History:  Diagnosis Date   Hemochromatosis    Migraine     Family History  Problem Relation Age of Onset   Healthy Mother    Acute myelogenous leukemia Father    Bipolar disorder Sister  Diabetes Neg Hx    Cancer Neg Hx    Heart failure Neg Hx    Hyperlipidemia Neg Hx    Hypertension Neg Hx     Past Surgical History:  Procedure Laterality Date   NASAL SEPTUM SURGERY     Social History   Occupational History   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

## 2021-06-10 DIAGNOSIS — M3500 Sicca syndrome, unspecified: Secondary | ICD-10-CM | POA: Diagnosis not present

## 2021-06-10 DIAGNOSIS — M950 Acquired deformity of nose: Secondary | ICD-10-CM | POA: Diagnosis not present

## 2021-06-10 DIAGNOSIS — J329 Chronic sinusitis, unspecified: Secondary | ICD-10-CM | POA: Diagnosis not present

## 2021-06-10 DIAGNOSIS — J310 Chronic rhinitis: Secondary | ICD-10-CM | POA: Diagnosis not present

## 2021-06-15 NOTE — Progress Notes (Signed)
?  HEMATOLOGY-ONCOLOGY TELEPHONE VISIT PROGRESS NOTE ? ?I connected with Kara [name] on 06/16/2021 at  2:30 PM EST by telephone and verified that I am speaking with the correct person using two identifiers.  ?I discussed the limitations, risks, security and privacy concerns of performing an evaluation and management service by telephone and the availability of in person appointments.  ?I also discussed with the patient that there may be a patient responsible charge related to this service. The patient expressed understanding and agreed to proceed.  ? ?History of Present Illness: Kara [name] is a 71 y.o. female with above-mentioned history of thrombocytosis and hereditary hemochromatosis who is currently on 81mg  Aspirin, hydroxyurea, and has received phlebotomies (last 05/12/21). She presents via telephone today for follow-up. ? ?Observations/Objective:  ? ?Assessment Plan:  ?Essential thrombocytosis ([place]) ?Current treatment: Anagrelide started 05/06/21 ?(Prior treatment hydroxyurea: Discontinued because of side effects including drooling around the mouth) ?Part of the increase in the platelet count could also be a reactive thrombocytosis from the profound iron deficiency be caused by the phlebotomies that we had done on her. ?  ?In spite of switching her from Hydrea to anagrelide, somehow her symptoms did not improve ( excessive saliva and cracks at corner of mouth) ? ?According to dermatology: Could be yeast infection. ?She is going to be treated with diflucan. ?Holding anagrelide until diflucan will be done. (Prolonged QT interval is adverse effects) ?  ?Plastic Surgery: Awaiting surgery (plastic surgeons are concerned that she may have Sjogren's syndrome and therefore they want to rule that out before planning any plastic surgery) ?They want to rule out Sjogrens Disease (dry mouth at night to sleeping with mouth open), most often its excessive saliva. ?We will obtain this blood work on Friday. ? ?Hemochromatosis:  holding phlebotomies for now ?F/U in 1 month ? ? ?I discussed the assessment and treatment plan with the patient. The patient was provided an opportunity to ask questions and all were answered. The patient agreed with the plan and demonstrated an understanding of the instructions. The patient was advised to call back or seek an in-person evaluation if the symptoms worsen or if the condition fails to improve as anticipated.  ? ?Total time spent: 12 mins including non-face to face time and time spent for planning, charting and coordination of care ? ?Rulon Eisenmenger, MD ?06/16/2021  ? ? I, Thana Ates, am acting as scribe for Nicholas Lose, MD. ? ?I have reviewed the above documentation for accuracy and completeness, and I agree with the above. ?  ? ?

## 2021-06-16 ENCOUNTER — Inpatient Hospital Stay: Payer: Medicare Other | Attending: Hematology and Oncology | Admitting: Hematology and Oncology

## 2021-06-16 DIAGNOSIS — D473 Essential (hemorrhagic) thrombocythemia: Secondary | ICD-10-CM | POA: Insufficient documentation

## 2021-06-16 DIAGNOSIS — D75839 Thrombocytosis, unspecified: Secondary | ICD-10-CM | POA: Insufficient documentation

## 2021-06-16 NOTE — Assessment & Plan Note (Signed)
Current treatment: Anagrelide started 05/06/21 ?(Prior treatment hydroxyurea: Discontinued because of side effects including drooling around the mouth) ?Part of the increase in the platelet count could also be a reactive thrombocytosis from the profound iron deficiency be caused by the phlebotomies that we had done on her. ?? ?In spite of switching her from Hydrea to anagrelide, somehow her symptoms did not improve. ?According to dermatology: Could be yeast infection. ?She is going to be treated with diflucan. ?Holding anagrelide until diflucan will be done.  (Prolonged QT interval is adverse effects) ?? ?Labs and follow-up every 2 months. ?

## 2021-06-17 ENCOUNTER — Other Ambulatory Visit: Payer: Self-pay

## 2021-06-17 ENCOUNTER — Telehealth: Payer: Self-pay | Admitting: Hematology and Oncology

## 2021-06-17 DIAGNOSIS — R5381 Other malaise: Secondary | ICD-10-CM

## 2021-06-17 DIAGNOSIS — R5383 Other fatigue: Secondary | ICD-10-CM

## 2021-06-17 NOTE — Telephone Encounter (Signed)
Scheduled appointment per 3/1 los. Patient is aware of tomorrow's lab only appointment.  ?

## 2021-06-18 ENCOUNTER — Inpatient Hospital Stay: Payer: Medicare Other

## 2021-06-18 DIAGNOSIS — D473 Essential (hemorrhagic) thrombocythemia: Secondary | ICD-10-CM

## 2021-06-18 DIAGNOSIS — R5381 Other malaise: Secondary | ICD-10-CM

## 2021-06-18 DIAGNOSIS — D75839 Thrombocytosis, unspecified: Secondary | ICD-10-CM | POA: Diagnosis not present

## 2021-06-18 DIAGNOSIS — R5383 Other fatigue: Secondary | ICD-10-CM

## 2021-06-18 LAB — CBC WITH DIFFERENTIAL (CANCER CENTER ONLY)
Abs Immature Granulocytes: 0.04 10*3/uL (ref 0.00–0.07)
Basophils Absolute: 0.1 10*3/uL (ref 0.0–0.1)
Basophils Relative: 1 %
Eosinophils Absolute: 0.3 10*3/uL (ref 0.0–0.5)
Eosinophils Relative: 3 %
HCT: 35.5 % — ABNORMAL LOW (ref 36.0–46.0)
Hemoglobin: 11.2 g/dL — ABNORMAL LOW (ref 12.0–15.0)
Immature Granulocytes: 0 %
Lymphocytes Relative: 22 %
Lymphs Abs: 2.1 10*3/uL (ref 0.7–4.0)
MCH: 28 pg (ref 26.0–34.0)
MCHC: 31.5 g/dL (ref 30.0–36.0)
MCV: 88.8 fL (ref 80.0–100.0)
Monocytes Absolute: 0.7 10*3/uL (ref 0.1–1.0)
Monocytes Relative: 7 %
Neutro Abs: 6.5 10*3/uL (ref 1.7–7.7)
Neutrophils Relative %: 67 %
Platelet Count: 778 10*3/uL — ABNORMAL HIGH (ref 150–400)
RBC: 4 MIL/uL (ref 3.87–5.11)
RDW: 14.1 % (ref 11.5–15.5)
WBC Count: 9.7 10*3/uL (ref 4.0–10.5)
nRBC: 0 % (ref 0.0–0.2)

## 2021-06-18 LAB — CMP (CANCER CENTER ONLY)
ALT: 10 U/L (ref 0–44)
AST: 16 U/L (ref 15–41)
Albumin: 4.6 g/dL (ref 3.5–5.0)
Alkaline Phosphatase: 57 U/L (ref 38–126)
Anion gap: 8 (ref 5–15)
BUN: 16 mg/dL (ref 8–23)
CO2: 25 mmol/L (ref 22–32)
Calcium: 9.5 mg/dL (ref 8.9–10.3)
Chloride: 107 mmol/L (ref 98–111)
Creatinine: 0.53 mg/dL (ref 0.44–1.00)
GFR, Estimated: >60 mL/min (ref >60)
Glucose, Bld: 93 mg/dL (ref 70–99)
Potassium: 4 mmol/L (ref 3.5–5.1)
Sodium: 140 mmol/L (ref 135–145)
Total Bilirubin: 0.3 mg/dL (ref 0.3–1.2)
Total Protein: 7.1 g/dL (ref 6.5–8.1)

## 2021-06-18 LAB — IRON AND IRON BINDING CAPACITY (CC-WL,HP ONLY)
Iron: 25 ug/dL — ABNORMAL LOW (ref 28–170)
Saturation Ratios: 7 % — ABNORMAL LOW (ref 10.4–31.8)
TIBC: 346 ug/dL (ref 250–450)
UIBC: 321 ug/dL (ref 148–442)

## 2021-06-19 LAB — SJOGREN'S SYNDROME ANTIBODS(SSA + SSB)
SSA (Ro) (ENA) Antibody, IgG: <0.2 AI (ref 0.0–0.9)
SSB (La) (ENA) Antibody, IgG: <0.2 AI (ref 0.0–0.9)

## 2021-06-21 LAB — FERRITIN: Ferritin: 4 ng/mL — ABNORMAL LOW (ref 11–307)

## 2021-06-23 ENCOUNTER — Encounter: Payer: Self-pay | Admitting: Hematology and Oncology

## 2021-06-23 NOTE — Assessment & Plan Note (Signed)
Current treatment: Anagrelide?started 05/06/21 ?(Prior treatment hydroxyurea: Discontinued because of side effects including drooling around the mouth) ?Part of the increase in the platelet count could also be a reactive thrombocytosis from the profound iron deficiency be caused by the phlebotomies that we had done on her. ?? ?In spite of switching her from Hydrea to anagrelide, somehow her symptoms did not improve ( excessive saliva and cracks at corner of mouth) ? ?Plastic Surgery: Awaiting surgery (plastic surgeons are concerned that she may have Sjogren's syndrome and therefore they want to rule that out before planning any plastic surgery) ?They want to rule out Sjogrens Disease (dry mouth at night to sleeping with mouth open), most often its excessive saliva. ?We will obtain this blood work on Friday. ?? ?Hemochromatosis: holding phlebotomies for now ?F/U in 1 month ?

## 2021-06-23 NOTE — Progress Notes (Signed)
HEMATOLOGY-ONCOLOGY TELEPHONE VISIT PROGRESS NOTE ? ?I connected with Kara [name] on 06/24/2021 at  8:45 AM EST by telephone and verified that I am speaking with the correct person using two identifiers.  ?I discussed the limitations, risks, security and privacy concerns of performing an evaluation and management service by telephone and the availability of in person appointments.  ?I also discussed with the patient that there may be a patient responsible charge related to this service. The patient expressed understanding and agreed to proceed.  ? ?History of Present Illness: Kara [name] is a 71 y.o. female with above-mentioned history of and hereditary hemochromatosis who is currently on '81mg'$  Aspirin, hydroxyurea, and has received phlebotomies (last 05/12/21). She presents via telephone today for follow-up. ?Fluconazole seems to have helped the thrush from face and anti-fungal ointment ?No improvement in excessive saliva ? ?Observations/Objective:  ?SSA And SSB: Neg ?  ?Assessment Plan:  ?Essential thrombocytosis ([place]) ?Current treatment: Anagrelide started 05/06/21 ?(Prior treatment hydroxyurea: Discontinued because of side effects including drooling around the mouth) ?Part of the increase in the platelet count could also be a reactive thrombocytosis from the profound iron deficiency be caused by the phlebotomies that we had done on her. ?Lab:06/18/21:  Hb 11.2, Platelet 778 ?  ?In spite of switching her from Hyd[place] to anagrelide, somehow her symptoms did not improve ( excessive saliva and cracks at corner of mouth) ? ?Plastic Surgery: Awaiting surgery (plastic surgeons are concerned that she may have Sjogren's syndrome and therefore they want to rule that out before planning any plastic surgery) ?They want to rule out Sjogrens Disease (dry mouth at night to sleeping with mouth open), most often its excessive saliva. ? ?Hemochromatosis: holding phlebotomies for now ?Excessive Saliva: recommended Scopolamine path (other  options include Glycopyrrolate or botox) ? ?F/U in 2 months with labs ? ?I discussed the assessment and treatment plan with the patient. The patient was provided an opportunity to ask questions and all were answered. The patient agreed with the plan and demonstrated an understanding of the instructions. The patient was advised to call back or seek an in-person evaluation if the symptoms worsen or if the condition fails to improve as anticipated.  ? ?Total time spent: 15 mins including non-face to face time and time spent for planning, charting and coordination of care ? ?Rulon Eisenmenger, MD ?06/24/2021  ? ? I, Thana Ates, am acting as scribe for Nicholas Lose, MD. ? ?I have reviewed the above documentation for accuracy and completeness, and I agree with the above. ?  ? ?

## 2021-06-24 ENCOUNTER — Inpatient Hospital Stay (HOSPITAL_BASED_OUTPATIENT_CLINIC_OR_DEPARTMENT_OTHER): Payer: Medicare Other | Admitting: Hematology and Oncology

## 2021-06-24 DIAGNOSIS — D473 Essential (hemorrhagic) thrombocythemia: Secondary | ICD-10-CM | POA: Diagnosis not present

## 2021-06-24 DIAGNOSIS — L308 Other specified dermatitis: Secondary | ICD-10-CM | POA: Diagnosis not present

## 2021-07-06 ENCOUNTER — Telehealth: Payer: Medicare Other | Admitting: Hematology and Oncology

## 2021-07-20 DIAGNOSIS — H10022 Other mucopurulent conjunctivitis, left eye: Secondary | ICD-10-CM | POA: Diagnosis not present

## 2021-07-21 NOTE — Progress Notes (Signed)
HEMATOLOGY-ONCOLOGY TELEPHONE VISIT PROGRESS NOTE ? ?I connected with Kara [name] on 08/02/21 at  8:45 AM EDT by telephone and verified that I am speaking with the correct person using two identifiers.  ?I discussed the limitations, risks, security and privacy concerns of performing an evaluation and management service by telephone and the availability of in person appointments.  ?I also discussed with the patient that there may be a patient responsible charge related to this service. The patient expressed understanding and agreed to proceed.  ? ?CHIEF COMPLAINT thrombocytosis and hereditary hemochromatosis  ? ?History of Present Illness: Kara [name] is a 71 y.o. female with above-mentioned history of thrombocytosis and hereditary hemochromatosis who is currently on '81mg'$  Aspirin, hydroxyurea, and has received phlebotomies (last 05/12/21). She presents via telephone today for follow-up. ? ?REVIEW OF SYSTEMS:  energy better ? ?All other systems were reviewed with the patient and are negative. ?Observations/Objective:  ?  ?Assessment Plan:  ?Essential thrombocytosis ([place]) ?Current treatment: Anagrelide started 05/06/21 ?(Prior treatment hydroxyurea: Discontinued because of side effects including drooling around the mouth) ?Part of the increase in the platelet count could also be a reactive thrombocytosis from the profound iron deficiency be caused by the phlebotomies that we had done on her. ?Lab: ?06/18/21:  Hb 11.2, Platelet 778 ?07/30/2021: Hemoglobin 12.9, platelets 783, iron saturation 13%, ferritin pending ? ?Plastic surgery plan: (Blood work ruled out Sjogren's syndrome): July 30th Dr.Miller (Face lift), She also needs nose surgery at some point. ? ?Hemochromatosis: holding phlebotomies for now ?Excessive Saliva: recommended Scopolamine patch (other options include Glycopyrrolate or botox) ?Stopped claritin ?Wants to see ENT (Recommended Dr.Bates) ?   ?F/U in 2 months with labs ? ? ? ?I discussed the assessment and  treatment plan with the patient. The patient was provided an opportunity to ask questions and all were answered. The patient agreed with the plan and demonstrated an understanding of the instructions. The patient was advised to call back or seek an in-person evaluation if the symptoms worsen or if the condition fails to improve as anticipated.  ? ?I provided 20 minutes of non-face-to-face time during this encounter. Harriette Ohara, MD   ?Earlie Server am scribing for Dr. Lindi Adie ? ?I have reviewed the above documentation for accuracy and completeness, and I agree with the above. ?  ?

## 2021-07-27 ENCOUNTER — Other Ambulatory Visit: Payer: Self-pay | Admitting: *Deleted

## 2021-07-27 DIAGNOSIS — D473 Essential (hemorrhagic) thrombocythemia: Secondary | ICD-10-CM

## 2021-07-29 ENCOUNTER — Inpatient Hospital Stay: Payer: Medicare Other

## 2021-07-30 ENCOUNTER — Inpatient Hospital Stay: Payer: Medicare Other | Attending: Hematology and Oncology

## 2021-07-30 ENCOUNTER — Other Ambulatory Visit: Payer: Self-pay

## 2021-07-30 DIAGNOSIS — D473 Essential (hemorrhagic) thrombocythemia: Secondary | ICD-10-CM | POA: Insufficient documentation

## 2021-07-30 LAB — IRON AND IRON BINDING CAPACITY (CC-WL,HP ONLY)
Iron: 43 ug/dL (ref 28–170)
Saturation Ratios: 13 % (ref 10.4–31.8)
TIBC: 325 ug/dL (ref 250–450)
UIBC: 282 ug/dL (ref 148–442)

## 2021-07-30 LAB — CBC WITH DIFFERENTIAL (CANCER CENTER ONLY)
Abs Immature Granulocytes: 0.04 10*3/uL (ref 0.00–0.07)
Basophils Absolute: 0 10*3/uL (ref 0.0–0.1)
Basophils Relative: 0 %
Eosinophils Absolute: 0.4 10*3/uL (ref 0.0–0.5)
Eosinophils Relative: 4 %
HCT: 39 % (ref 36.0–46.0)
Hemoglobin: 12.9 g/dL (ref 12.0–15.0)
Immature Granulocytes: 0 %
Lymphocytes Relative: 23 %
Lymphs Abs: 2.4 10*3/uL (ref 0.7–4.0)
MCH: 27.5 pg (ref 26.0–34.0)
MCHC: 33.1 g/dL (ref 30.0–36.0)
MCV: 83.2 fL (ref 80.0–100.0)
Monocytes Absolute: 0.8 10*3/uL (ref 0.1–1.0)
Monocytes Relative: 7 %
Neutro Abs: 6.9 10*3/uL (ref 1.7–7.7)
Neutrophils Relative %: 66 %
Platelet Count: 783 10*3/uL — ABNORMAL HIGH (ref 150–400)
RBC: 4.69 MIL/uL (ref 3.87–5.11)
RDW: 16.2 % — ABNORMAL HIGH (ref 11.5–15.5)
WBC Count: 10.6 10*3/uL — ABNORMAL HIGH (ref 4.0–10.5)
nRBC: 0 % (ref 0.0–0.2)

## 2021-07-30 LAB — CMP (CANCER CENTER ONLY)
ALT: 10 U/L (ref 0–44)
AST: 16 U/L (ref 15–41)
Albumin: 4.6 g/dL (ref 3.5–5.0)
Alkaline Phosphatase: 61 U/L (ref 38–126)
Anion gap: 7 (ref 5–15)
BUN: 17 mg/dL (ref 8–23)
CO2: 23 mmol/L (ref 22–32)
Calcium: 9.3 mg/dL (ref 8.9–10.3)
Chloride: 108 mmol/L (ref 98–111)
Creatinine: 0.73 mg/dL (ref 0.44–1.00)
GFR, Estimated: >60 mL/min
Glucose, Bld: 83 mg/dL (ref 70–99)
Potassium: 4.2 mmol/L (ref 3.5–5.1)
Sodium: 138 mmol/L (ref 135–145)
Total Bilirubin: 0.3 mg/dL (ref 0.3–1.2)
Total Protein: 7.2 g/dL (ref 6.5–8.1)

## 2021-08-02 ENCOUNTER — Inpatient Hospital Stay (HOSPITAL_BASED_OUTPATIENT_CLINIC_OR_DEPARTMENT_OTHER): Payer: Medicare Other | Admitting: Hematology and Oncology

## 2021-08-02 DIAGNOSIS — D473 Essential (hemorrhagic) thrombocythemia: Secondary | ICD-10-CM | POA: Diagnosis not present

## 2021-08-02 LAB — FERRITIN: Ferritin: 7 ng/mL — ABNORMAL LOW (ref 11–307)

## 2021-08-02 NOTE — Assessment & Plan Note (Addendum)
Current treatment: Anagrelide?started 05/06/21 ?(Prior treatment hydroxyurea: Discontinued because of side effects including drooling around the mouth) ?Part of the increase in the platelet count could also be a reactive thrombocytosis from the profound iron deficiency be caused by the phlebotomies that we had done on her. ?Lab: ?06/18/21:  Hb 11.2, Platelet 778 ?07/30/2021: Hemoglobin 12.9, platelets 783, iron saturation 13%, ferritin pending ? ?Plastic surgery plan: (Blood work ruled out Sjogren's syndrome): July 30th Dr.Miller (Face lift), She also needs nose surgery at some point. ? ?Hemochromatosis: holding phlebotomies for now ?Excessive Saliva: recommended Scopolamine patch (other options include Glycopyrrolate or botox) ?Stopped claritin ?Wants to see ENT (Recommended Dr.Bates) ??  ?F/U in 2 months with labs ? ?

## 2021-08-03 ENCOUNTER — Telehealth: Payer: Self-pay | Admitting: Hematology and Oncology

## 2021-08-03 NOTE — Telephone Encounter (Signed)
Scheduled appointment per 04/17 los. Patient aware.  ?

## 2021-08-05 DIAGNOSIS — K130 Diseases of lips: Secondary | ICD-10-CM | POA: Diagnosis not present

## 2021-08-05 DIAGNOSIS — L308 Other specified dermatitis: Secondary | ICD-10-CM | POA: Diagnosis not present

## 2021-08-05 DIAGNOSIS — L821 Other seborrheic keratosis: Secondary | ICD-10-CM | POA: Diagnosis not present

## 2021-08-05 DIAGNOSIS — L578 Other skin changes due to chronic exposure to nonionizing radiation: Secondary | ICD-10-CM | POA: Diagnosis not present

## 2021-08-06 DIAGNOSIS — H02835 Dermatochalasis of left lower eyelid: Secondary | ICD-10-CM | POA: Diagnosis not present

## 2021-08-06 DIAGNOSIS — H0279 Other degenerative disorders of eyelid and periocular area: Secondary | ICD-10-CM | POA: Diagnosis not present

## 2021-08-06 DIAGNOSIS — H02413 Mechanical ptosis of bilateral eyelids: Secondary | ICD-10-CM | POA: Diagnosis not present

## 2021-08-06 DIAGNOSIS — H53483 Generalized contraction of visual field, bilateral: Secondary | ICD-10-CM | POA: Diagnosis not present

## 2021-08-06 DIAGNOSIS — H02834 Dermatochalasis of left upper eyelid: Secondary | ICD-10-CM | POA: Diagnosis not present

## 2021-08-06 DIAGNOSIS — H02423 Myogenic ptosis of bilateral eyelids: Secondary | ICD-10-CM | POA: Diagnosis not present

## 2021-08-06 DIAGNOSIS — H57813 Brow ptosis, bilateral: Secondary | ICD-10-CM | POA: Diagnosis not present

## 2021-08-06 DIAGNOSIS — H02832 Dermatochalasis of right lower eyelid: Secondary | ICD-10-CM | POA: Diagnosis not present

## 2021-08-06 DIAGNOSIS — H02831 Dermatochalasis of right upper eyelid: Secondary | ICD-10-CM | POA: Diagnosis not present

## 2021-09-20 NOTE — Progress Notes (Signed)
HEMATOLOGY-ONCOLOGY TELEPHONE VISIT PROGRESS NOTE  I connected with [name] on 10/04/21 at  8:30 AM EDT by telephone and verified that I am speaking with the correct person using two identifiers.  I discussed the limitations, risks, security and privacy concerns of performing an evaluation and management service by telephone and the availability of in person appointments.  I also discussed with the patient that there may be a patient responsible charge related to this service. The patient expressed understanding and agreed to proceed.   History of Present Illness: Kara [name] is a 71 y.o. female with above-mentioned history of thrombocytosis and hereditary hemochromatosis. She presents via telephone today for follow-up.   REVIEW OF SYSTEMS:   Constitutional: Really sad that her cat disappeared Intermittent headaches since Friday, Left eye lid feels like a droop, slight change in pupil sizes Sinuses also active Improved with resting.  All other systems were reviewed with the patient and are negative. Observations/Objective:    Assessment Plan:   Essential thrombocytosis ([place]) Current treatment: Anagrelide started 05/06/21 (Prior treatment hydroxyurea: Discontinued because of side effects including drooling around the mouth)  Lab: 06/18/21:  Hb 11.2, Platelet 778 07/30/2021: Hemoglobin 12.9, platelets 783, iron saturation 13%, ferritin 7  10/01/2021: Hemoglobin 14.1, platelets 828, ferritin 22   10/04/21: Increased anagrelide to Bid  Plastic surgery plan: (Blood work ruled out Sjogren's syndrome): July 30th Dr.Miller (Face lift), She also needs nose surgery at some point.   Hemochromatosis: holding phlebotomies for now Headaches and eye lid droop: Encouraged to increase anagrelide BID Dizziness: couple of episodes    F/U in 1 months with labs    I discussed the assessment and treatment plan with the patient. The patient was provided an opportunity to ask questions and all were answered.  The patient agreed with the plan and demonstrated an understanding of the instructions. The patient was advised to call back or seek an in-person evaluation if the symptoms worsen or if the condition fails to improve as anticipated.   I provided 15 minutes of non-face-to-face time during this encounter. Harriette Ohara, MD  I Gardiner Coins am scribing for Dr. Lindi Adie  I have reviewed the above documentation for accuracy and completeness, and I agree with the above.

## 2021-09-24 ENCOUNTER — Other Ambulatory Visit: Payer: Self-pay | Admitting: *Deleted

## 2021-09-24 ENCOUNTER — Telehealth: Payer: Self-pay

## 2021-09-24 NOTE — Telephone Encounter (Signed)
Return call to pt, she states she is 'having spells' again and describes them  as 'I just feel different' pt denies chest pain, SOB, no loss of consciousness or memory, no loss of sensation in extremities, no fever, no dizziness, BP stable.  Pt states multiple times the only way she knows to explain this to anyone is that she just 'feels different'    I reviewed warning signs for when emergent care is needed (based on the above symptoms previously discussed) pt states she knows when she needs to seek emergency care and lives close to an ED.  Pt verbalized understanding of the above and is perfectly fine keeping her appt's scheduled as is currently.

## 2021-09-28 DIAGNOSIS — M9904 Segmental and somatic dysfunction of sacral region: Secondary | ICD-10-CM | POA: Diagnosis not present

## 2021-09-28 DIAGNOSIS — M9903 Segmental and somatic dysfunction of lumbar region: Secondary | ICD-10-CM | POA: Diagnosis not present

## 2021-09-28 DIAGNOSIS — M5136 Other intervertebral disc degeneration, lumbar region: Secondary | ICD-10-CM | POA: Diagnosis not present

## 2021-09-28 DIAGNOSIS — M9905 Segmental and somatic dysfunction of pelvic region: Secondary | ICD-10-CM | POA: Diagnosis not present

## 2021-10-01 ENCOUNTER — Other Ambulatory Visit: Payer: Self-pay

## 2021-10-01 ENCOUNTER — Inpatient Hospital Stay: Payer: Medicare Other | Attending: Hematology and Oncology

## 2021-10-01 DIAGNOSIS — D473 Essential (hemorrhagic) thrombocythemia: Secondary | ICD-10-CM

## 2021-10-01 LAB — CBC WITH DIFFERENTIAL (CANCER CENTER ONLY)
Abs Immature Granulocytes: 0.03 10*3/uL (ref 0.00–0.07)
Basophils Absolute: 0.1 10*3/uL (ref 0.0–0.1)
Basophils Relative: 1 %
Eosinophils Absolute: 0.3 10*3/uL (ref 0.0–0.5)
Eosinophils Relative: 3 %
HCT: 41.9 % (ref 36.0–46.0)
Hemoglobin: 14.1 g/dL (ref 12.0–15.0)
Immature Granulocytes: 0 %
Lymphocytes Relative: 20 %
Lymphs Abs: 2.1 10*3/uL (ref 0.7–4.0)
MCH: 27.1 pg (ref 26.0–34.0)
MCHC: 33.7 g/dL (ref 30.0–36.0)
MCV: 80.6 fL (ref 80.0–100.0)
Monocytes Absolute: 0.8 10*3/uL (ref 0.1–1.0)
Monocytes Relative: 8 %
Neutro Abs: 7.2 10*3/uL (ref 1.7–7.7)
Neutrophils Relative %: 68 %
Platelet Count: 828 10*3/uL — ABNORMAL HIGH (ref 150–400)
RBC: 5.2 MIL/uL — ABNORMAL HIGH (ref 3.87–5.11)
RDW: 19.9 % — ABNORMAL HIGH (ref 11.5–15.5)
WBC Count: 10.5 10*3/uL (ref 4.0–10.5)
nRBC: 0 % (ref 0.0–0.2)

## 2021-10-01 LAB — CMP (CANCER CENTER ONLY)
ALT: 10 U/L (ref 0–44)
AST: 19 U/L (ref 15–41)
Albumin: 4.7 g/dL (ref 3.5–5.0)
Alkaline Phosphatase: 52 U/L (ref 38–126)
Anion gap: 8 (ref 5–15)
BUN: 15 mg/dL (ref 8–23)
CO2: 23 mmol/L (ref 22–32)
Calcium: 10 mg/dL (ref 8.9–10.3)
Chloride: 103 mmol/L (ref 98–111)
Creatinine: 0.61 mg/dL (ref 0.44–1.00)
GFR, Estimated: >60 mL/min (ref >60)
Glucose, Bld: 99 mg/dL (ref 70–99)
Potassium: 4.3 mmol/L (ref 3.5–5.1)
Sodium: 134 mmol/L — ABNORMAL LOW (ref 135–145)
Total Bilirubin: 0.4 mg/dL (ref 0.3–1.2)
Total Protein: 7.1 g/dL (ref 6.5–8.1)

## 2021-10-01 LAB — FERRITIN: Ferritin: 22 ng/mL (ref 11–307)

## 2021-10-04 ENCOUNTER — Inpatient Hospital Stay (HOSPITAL_BASED_OUTPATIENT_CLINIC_OR_DEPARTMENT_OTHER): Payer: Medicare Other | Admitting: Hematology and Oncology

## 2021-10-04 DIAGNOSIS — D473 Essential (hemorrhagic) thrombocythemia: Secondary | ICD-10-CM | POA: Diagnosis not present

## 2021-10-04 NOTE — Assessment & Plan Note (Signed)
Current treatment: Anagrelidestarted 05/06/21 (Prior treatment hydroxyurea: Discontinued because of side effects including drooling around the mouth) Part of the increase in the platelet count could also be a reactive thrombocytosis from the profound iron deficiency be caused by the phlebotomies that we had done on her. Lab: 06/18/21: Hb 11.2, Platelet 778 07/30/2021: Hemoglobin 12.9, platelets 783, iron saturation 13%, ferritin 7  10/01/2021: Hemoglobin 14.1, platelets 828, ferritin 22 We will discuss with the patient about increasing the dosage of anagrelide.  Plastic surgery plan: (Blood work ruled out Sjogren's syndrome): July 30th Dr.Miller (Face lift), She also needs nose surgery at some point.  Hemochromatosis: holding phlebotomies for now Excessive Saliva: Referral to ENT (Recommended Dr.Bates)   F/U in78month with labs

## 2021-10-05 ENCOUNTER — Telehealth: Payer: Self-pay | Admitting: Hematology and Oncology

## 2021-10-05 NOTE — Telephone Encounter (Signed)
Scheduled appointment per 6/19 los. Patient is aware.

## 2021-10-12 DIAGNOSIS — H0102A Squamous blepharitis right eye, upper and lower eyelids: Secondary | ICD-10-CM | POA: Diagnosis not present

## 2021-10-12 DIAGNOSIS — H04123 Dry eye syndrome of bilateral lacrimal glands: Secondary | ICD-10-CM | POA: Diagnosis not present

## 2021-10-12 DIAGNOSIS — H2513 Age-related nuclear cataract, bilateral: Secondary | ICD-10-CM | POA: Diagnosis not present

## 2021-10-12 DIAGNOSIS — H43811 Vitreous degeneration, right eye: Secondary | ICD-10-CM | POA: Diagnosis not present

## 2021-10-12 DIAGNOSIS — H0102B Squamous blepharitis left eye, upper and lower eyelids: Secondary | ICD-10-CM | POA: Diagnosis not present

## 2021-10-18 ENCOUNTER — Telehealth: Payer: Self-pay | Admitting: *Deleted

## 2021-10-18 NOTE — Progress Notes (Signed)
HEMATOLOGY-ONCOLOGY TELEPHONE VISIT PROGRESS NOTE  I connected with Kara [name] on 10/20/21 at 11:45 AM EDT by telephone and verified that I am speaking with the correct person using two identifiers.  I discussed the limitations, risks, security and privacy concerns of performing an evaluation and management service by telephone and the availability of in person appointments.  I also discussed with the patient that there may be a patient responsible charge related to this service. The patient expressed understanding and agreed to proceed.   History of Present Illness:  Kara [name] is a 71 y.o. female with above-mentioned history of thrombocytosis and hereditary hemochromatosis. She presents via telephone today for follow-up. She was having diarrhea due to anagrelide. She has lots of mucus in bowels.    REVIEW OF SYSTEMS:   Constitutional: Denies fevers, chills or abnormal weight loss Eyes: Denies blurriness of vision Ears, nose, mouth, throat, and face: Denies mucositis or sore throat Respiratory: Denies cough, dyspnea or wheezes Cardiovascular: Denies palpitation, chest discomfort Gastrointestinal:  Diarrhea Skin: Denies abnormal skin rashes Lymphatics: Denies new lymphadenopathy or easy bruising Neurological:Denies numbness, tingling or new weaknesses Behavioral/Psych: Mood is stable, no new changes  Extremities: No lower extremity edema   All other systems were reviewed with the patient and are negative. Observations/Objective:     Assessment Plan:  Essential thrombocytosis ([place]) Current treatment: Anagrelide started 05/06/21 (Prior treatment hydroxyurea: Discontinued because of side effects including drooling around the mouth)   Lab: 06/18/21:  Hb 11.2, Platelet 778 07/30/2021: Hemoglobin 12.9, platelets 783, iron saturation 13%, ferritin 7  10/01/2021: Hemoglobin 14.1, platelets 828, ferritin 22    10/04/21: Increased anagrelide to Bid   Plastic surgery plan: (Blood work ruled out  Sjogren's syndrome): July 30th Dr.Miller (Face lift), She also needs nose surgery at some point.   Hemochromatosis: holding phlebotomies for now Headaches and eye lid droop: Increased anagrelide BID 10/04/2021 Developed diarrhea  She will try to take it very regularly and regimented Dizziness: couple of episodes She lost her cat and is very sad about that    I discussed the assessment and treatment plan with the patient. The patient was provided an opportunity to ask questions and all were answered. The patient agreed with the plan and demonstrated an understanding of the instructions. The patient was advised to call back or seek an in-person evaluation if the symptoms worsen or if the condition fails to improve as anticipated.   I provided 12 minutes of non-face-to-face time during this encounter. Harriette Ohara, MD    I Gardiner Coins am scribing for Dr. Lindi Adie  I have reviewed the above documentation for accuracy and completeness, and I agree with the above.

## 2021-10-18 NOTE — Telephone Encounter (Signed)
Received call from pt with complaint of continuous dark rectal drainage since increasing Agrylin to BID. Pt denies abdominal pain or distention at this time.  Pt concerned for vaginal infection if drainage continues. Pt requesting MD visit to discuss medication and recommendations.  Appts scheduled, pt verbalized understanding of appt date and time.

## 2021-10-20 ENCOUNTER — Inpatient Hospital Stay: Payer: Medicare Other | Attending: Hematology and Oncology | Admitting: Hematology and Oncology

## 2021-10-20 DIAGNOSIS — D473 Essential (hemorrhagic) thrombocythemia: Secondary | ICD-10-CM | POA: Diagnosis not present

## 2021-10-20 NOTE — Assessment & Plan Note (Signed)
Current treatment: Anagrelidestarted 05/06/21 (Prior treatment hydroxyurea: Discontinued because of side effects including drooling around the mouth)  Lab: 06/18/21: Hb 11.2, Platelet 778 07/30/2021: Hemoglobin 12.9, platelets 783, iron saturation 13%, ferritin 7  10/01/2021: Hemoglobin 14.1, platelets 828, ferritin 22   10/04/21: Increased anagrelide to Bid  Plastic surgery plan: (Blood work ruled out Sjogren's syndrome): July 30th Dr.Miller (Face lift), She also needs nose surgery at some point.  Hemochromatosis: holding phlebotomies for now Headaches and eye lid droop: Increased anagrelide BID 10/04/2021 Dizziness: couple of episodes

## 2021-10-21 NOTE — Progress Notes (Signed)
HEMATOLOGY-ONCOLOGY TELEPHONE VISIT PROGRESS NOTE  I connected with our patient on 11/04/21 at  8:15 AM EDT by telephone and verified that I am speaking with the correct person using two identifiers.  I discussed the limitations, risks, security and privacy concerns of performing an evaluation and management service by telephone and the availability of in person appointments.  I also discussed with the patient that there may be a patient responsible charge related to this service. The patient expressed understanding and agreed to proceed.   History of Present Illness: : Kara [name] [name] is a 71 y.o. female with above-mentioned history of thrombocytosis and hereditary hemochromatosis. She presents via telephone today for follow-up. C/O palpitations. Intestinal disturbance from taking Anagrelide   REVIEW OF SYSTEMS:   Constitutional: Denies fevers, chills or abnormal weight loss All other systems were reviewed with the patient and are negative.  Observations/Objective:    Assessment Plan:  Essential thrombocytosis ([place]) Current treatment: Anagrelide started 05/06/21 (Prior treatment hydroxyurea: Discontinued because of side effects including drooling around the mouth)   Lab: 06/18/21:  Hb 11.2, Platelet 778 07/30/2021: Hemoglobin 12.9, platelets 783, iron saturation 13%, ferritin 7  10/01/2021: Hemoglobin 14.1, platelets 828, ferritin 22 11/02/2021: Hemoglobin 12.9, platelets 435, ferritin 22, iron saturation 54%   10/04/21: Increased anagrelide to Bid Continue with the current dosage of anagrelide.  Hemochromatosis: holding phlebotomies for now Headaches and eye lid droop: Encouraged to increase anagrelide BID Dizziness: couple of episodes Palpitations: concerning. Will request [name] to see her. Still grieving the loss of her cat.    F/U in 1 months with labs and in 2 months    I discussed the assessment and treatment plan with the patient. The patient was provided an opportunity to  ask questions and all were answered. The patient agreed with the plan and demonstrated an understanding of the instructions. The patient was advised to call back or seek an in-person evaluation if the symptoms worsen or if the condition fails to improve as anticipated.   I provided 12 minutes of non-face-to-face time during this encounter.  This includes time for charting and coordination of care   Harriette Ohara, MD   I Gardiner Coins am scribing for Dr. Lindi Adie  I have reviewed the above documentation for accuracy and completeness, and I agree with the above.

## 2021-11-02 ENCOUNTER — Other Ambulatory Visit: Payer: Self-pay

## 2021-11-02 ENCOUNTER — Inpatient Hospital Stay: Payer: Medicare Other

## 2021-11-02 DIAGNOSIS — D473 Essential (hemorrhagic) thrombocythemia: Secondary | ICD-10-CM

## 2021-11-02 LAB — CBC WITH DIFFERENTIAL (CANCER CENTER ONLY)
Abs Immature Granulocytes: 0.02 10*3/uL (ref 0.00–0.07)
Basophils Absolute: 0.1 10*3/uL (ref 0.0–0.1)
Basophils Relative: 1 %
Eosinophils Absolute: 0.2 10*3/uL (ref 0.0–0.5)
Eosinophils Relative: 2 %
HCT: 38.8 % (ref 36.0–46.0)
Hemoglobin: 12.9 g/dL (ref 12.0–15.0)
Immature Granulocytes: 0 %
Lymphocytes Relative: 18 %
Lymphs Abs: 1.9 10*3/uL (ref 0.7–4.0)
MCH: 28 pg (ref 26.0–34.0)
MCHC: 33.2 g/dL (ref 30.0–36.0)
MCV: 84.2 fL (ref 80.0–100.0)
Monocytes Absolute: 0.8 10*3/uL (ref 0.1–1.0)
Monocytes Relative: 7 %
Neutro Abs: 7.4 10*3/uL (ref 1.7–7.7)
Neutrophils Relative %: 72 %
Platelet Count: 435 10*3/uL — ABNORMAL HIGH (ref 150–400)
RBC: 4.61 MIL/uL (ref 3.87–5.11)
RDW: 19.8 % — ABNORMAL HIGH (ref 11.5–15.5)
Smear Review: NORMAL
WBC Count: 10.4 10*3/uL (ref 4.0–10.5)
nRBC: 0 % (ref 0.0–0.2)

## 2021-11-02 LAB — CMP (CANCER CENTER ONLY)
ALT: 12 U/L (ref 0–44)
AST: 16 U/L (ref 15–41)
Albumin: 4.5 g/dL (ref 3.5–5.0)
Alkaline Phosphatase: 63 U/L (ref 38–126)
Anion gap: 9 (ref 5–15)
BUN: 15 mg/dL (ref 8–23)
CO2: 21 mmol/L — ABNORMAL LOW (ref 22–32)
Calcium: 9.1 mg/dL (ref 8.9–10.3)
Chloride: 110 mmol/L (ref 98–111)
Creatinine: 0.44 mg/dL (ref 0.44–1.00)
GFR, Estimated: >60 mL/min (ref >60)
Glucose, Bld: 85 mg/dL (ref 70–99)
Potassium: 4.2 mmol/L (ref 3.5–5.1)
Sodium: 140 mmol/L (ref 135–145)
Total Bilirubin: 0.4 mg/dL (ref 0.3–1.2)
Total Protein: 7.2 g/dL (ref 6.5–8.1)

## 2021-11-03 LAB — IRON AND IRON BINDING CAPACITY (CC-WL,HP ONLY)
Iron: 129 ug/dL (ref 28–170)
Saturation Ratios: 54 % — ABNORMAL HIGH (ref 10.4–31.8)
TIBC: 238 ug/dL — ABNORMAL LOW (ref 250–450)
UIBC: 109 ug/dL — ABNORMAL LOW (ref 148–442)

## 2021-11-03 LAB — FERRITIN: Ferritin: 22 ng/mL (ref 11–307)

## 2021-11-04 ENCOUNTER — Inpatient Hospital Stay (HOSPITAL_BASED_OUTPATIENT_CLINIC_OR_DEPARTMENT_OTHER): Payer: Medicare Other | Admitting: Hematology and Oncology

## 2021-11-04 DIAGNOSIS — D473 Essential (hemorrhagic) thrombocythemia: Secondary | ICD-10-CM

## 2021-11-04 NOTE — Assessment & Plan Note (Addendum)
Current treatment: Anagrelidestarted 05/06/21 (Prior treatment hydroxyurea: Discontinued because of side effects including drooling around the mouth)  Lab: 06/18/21: Hb 11.2, Platelet 778 07/30/2021: Hemoglobin 12.9, platelets 783, iron saturation 13%, ferritin 7  10/01/2021: Hemoglobin 14.1, platelets 828, ferritin 22 11/02/2021: Hemoglobin 12.9, platelets 435, ferritin 22, iron saturation 54%  10/04/21: Increased anagrelide to Bid Continue with the current dosage of anagrelide.  Hemochromatosis: holding phlebotomies for now Headaches and eye lid droop: Encouraged to increase anagrelide BID Dizziness: couple of episodes  F/U in40month with labs

## 2021-11-05 ENCOUNTER — Telehealth: Payer: Self-pay | Admitting: Hematology and Oncology

## 2021-11-05 NOTE — Telephone Encounter (Signed)
Scheduled appointment per 7/20 los. Patient is aware.

## 2021-11-09 ENCOUNTER — Telehealth (HOSPITAL_COMMUNITY): Payer: Self-pay | Admitting: Cardiology

## 2021-11-09 NOTE — Telephone Encounter (Signed)
-----   Message from Scarlette Calico, RN sent at 11/09/2021  3:23 PM EDT ----- Regarding: FW: Palpitations Anyone have time to handle this please, thanks ----- Message ----- From: Jolaine Artist, MD Sent: 11/05/2021   2:31 PM EDT To: Scarlette Calico, RN; Nicholas Lose, MD Subject: RE: Palpitations                               Nira Conn - can you get a zio on her and bring her in for a visit?  Thanks'  ----- Message ----- From: Nicholas Lose, MD Sent: 11/04/2021   8:33 AM EDT To: Jolaine Artist, MD Subject: Palpitations                                   Dan, This is a patient with a myeloproliferative disease on Anagrelide. It has a side effect of palpitations (26%). Can you please see her and suggest treatments? She saw Dr.Berry before and she doesn't want to see him. Thanks for your help. Vinay

## 2021-11-09 NOTE — Telephone Encounter (Signed)
Pt aware and scheduled for zio placement 7/26 @ 2pm

## 2021-11-10 ENCOUNTER — Ambulatory Visit (HOSPITAL_COMMUNITY): Payer: Medicare Other | Admitting: Internal Medicine

## 2021-11-10 ENCOUNTER — Ambulatory Visit (HOSPITAL_COMMUNITY)
Admission: EM | Admit: 2021-11-10 | Discharge: 2021-11-10 | Disposition: A | Discharge status: Home / Self Care | Payer: Medicare Other | Attending: Emergency Medicine | Admitting: Emergency Medicine

## 2021-11-10 ENCOUNTER — Encounter (HOSPITAL_COMMUNITY): Payer: Self-pay | Admitting: Emergency Medicine

## 2021-11-10 ENCOUNTER — Encounter (HOSPITAL_COMMUNITY): Payer: Medicare Other

## 2021-11-10 DIAGNOSIS — J0100 Acute maxillary sinusitis, unspecified: Secondary | ICD-10-CM | POA: Diagnosis not present

## 2021-11-10 MED ORDER — AZITHROMYCIN 250 MG PO TABS: 250.0000 mg | ORAL_TABLET | Freq: Every day | ORAL | 0 refills | Status: DC

## 2021-11-10 NOTE — ED Provider Notes (Signed)
[facility]    CSN: [id number] Arrival date & time: 11/10/21  1442      History   Chief Complaint Chief Complaint  Patient presents with   Headache   Nasal Congestion    HPI Kara [name] is a 71 y.o. female.   Patient presents with intermittent frontal and parietal headaches for 4 to 5 days.  Symptoms began after a dry nasal mucosa with associated congestion and sinus pressure predominantly on the left side.  Generally feeling unwell.  Endorses headaches feel like pressure and having fullness to them as if they the head will explode.  Has been been managing with ibuprofen.  Endorses sneezing attack overnight that she felt better once resolved.  Denies fever, chills, body aches, rhinorrhea, postnasal drip, ear pain, sore throat, shortness of breath or wheezing, coughing.  Endorses history of hemochromatosis in which she was out of medication for 4 to 5 days.  Unsure if related.  History of migraines.  Past Medical History:  Diagnosis Date   Hemochromatosis    Migraine     Patient Active Problem List   Diagnosis Date Noted   Essential thrombocytosis ([place]) 05/22/2018   Chronic migraine 11/21/2017   Hemochromatosis 06/01/2016   Benign paroxysmal positional vertigo 05/20/2016   Orthostatic hypotension 05/20/2016   Nasal obstruction 05/19/2016   Light headedness 04/28/2016   Screening for hyperlipidemia 04/28/2016    Past Surgical History:  Procedure Laterality Date   NASAL SEPTUM SURGERY      OB History   No obstetric history on file.      Home Medications    Prior to Admission medications   Medication Sig Start Date End Date Taking? Authorizing Provider  anagrelide (AGRYLIN) 1 MG capsule Take 1 capsule (1 mg total) by mouth 2 (two) times daily. 04/22/21   Nicholas Lose, MD  aspirin EC 81 MG tablet Take 1 tablet (81 mg total) by mouth daily. 12/20/18   Nicholas Lose, MD    Family History Family History  Problem Relation Age of Onset   Healthy Mother     Acute myelogenous leukemia Father    Bipolar disorder Sister    Diabetes Neg Hx    Cancer Neg Hx    Heart failure Neg Hx    Hyperlipidemia Neg Hx    Hypertension Neg Hx     Social History Social History   Tobacco Use   Smoking status: Every Day    Packs/day: 1.00    Types: Cigarettes   Smokeless tobacco: Never  Substance Use Topics   Alcohol use: No    Comment: former heavy drinker now sober   Drug use: No     Allergies   Penicillins   Review of Systems Review of Systems  Constitutional: Negative.   HENT:  Positive for congestion and sinus pressure. Negative for dental problem, drooling, ear discharge, ear pain, facial swelling, hearing loss, mouth sores, nosebleeds, postnasal drip, rhinorrhea, sinus pain, sneezing, sore throat, tinnitus, trouble swallowing and voice change.   Respiratory: Negative.    Cardiovascular: Negative.   Skin: Negative.   Neurological:  Positive for headaches. Negative for dizziness, tremors, seizures, syncope, facial asymmetry, speech difficulty, weakness, light-headedness and numbness.     Physical Exam Triage Vital Signs ED Triage Vitals  Enc Vitals Group     BP 11/10/21 1534 (!) 151/83     Pulse Rate 11/10/21 1534 79     Resp 11/10/21 1534 18     Temp 11/10/21 1534 97.7 F (36.5 C)  Temp Source 11/10/21 1534 Oral     SpO2 11/10/21 1534 95 %     Weight --      Height --      Head Circumference --      Peak Flow --      Pain Score 11/10/21 1530 6     Pain Loc --      Pain Edu? --      Excl. in [place]? --    No data found.  Updated Vital Signs BP (!) 151/83 (BP Location: Right Arm)   Pulse 79   Temp 97.7 F (36.5 C) (Oral)   Resp 18   SpO2 95%   Visual Acuity Right Eye Distance:   Left Eye Distance:   Bilateral Distance:    Right Eye Near:   Left Eye Near:    Bilateral Near:     Physical Exam Constitutional:      Appearance: Normal appearance. She is well-developed.  HENT:     Head: Normocephalic.     Right  Ear: Tympanic membrane, ear canal and external ear normal.     Left Ear: Tympanic membrane, ear canal and external ear normal.     Nose: Congestion present.     Right Turbinates: Swollen.     Left Turbinates: Swollen.     Right Sinus: No maxillary sinus tenderness or frontal sinus tenderness.     Left Sinus: No maxillary sinus tenderness or frontal sinus tenderness.     Mouth/Throat:     Mouth: Mucous membranes are moist.     Pharynx: Oropharynx is clear.  Eyes:     Extraocular Movements: Extraocular movements intact.  Cardiovascular:     Rate and Rhythm: Normal rate and regular rhythm.     Pulses: Normal pulses.     Heart sounds: Normal heart sounds.  Pulmonary:     Effort: Pulmonary effort is normal.     Breath sounds: Normal breath sounds.  Musculoskeletal:     Cervical back: Normal range of motion and neck supple.  Skin:    General: Skin is warm and dry.  Neurological:     Mental Status: She is alert and oriented to person, place, and time. Mental status is at baseline.  Psychiatric:        Mood and Affect: Mood normal.        Behavior: Behavior normal.      UC Treatments / Results  Labs (all labs ordered are listed, but only abnormal results are displayed) Labs Reviewed - No data to display  EKG   Radiology No results found.  Procedures Procedures (including critical care time)  Medications Ordered in UC Medications - No data to display  Initial Impression / Assessment and Plan / UC Course  I have reviewed the triage vital signs and the nursing notes.  Pertinent labs & imaging results that were available during my care of the patient were reviewed by me and considered in my medical decision making (see chart for details).  Acute nonrecurrent maxillary sinusitis  Vital signs are stable and patient is in no signs of distress, no abnormalities neurologically and well unable to produce tenderness to the sinuses patient's description is of sinus pressure which  most likely has resulted in headache, discussed, Z-Pak prescribed and patient would like to attempt treatment with saline irrigation, unable to tolerate Flonase, given strict precautions to which she uses.  Is the worst headache of her life, she is to go to the nearest emergency department for further  evaluation and management, may follow-up with this urgent care or PCP if symptoms persist but do not worsen Final Clinical Impressions(s) / UC Diagnoses   Final diagnoses:  None   Discharge Instructions   None    ED Prescriptions   None    PDMP not reviewed this encounter.   Hans Eden, [place] 11/10/21 1656

## 2021-11-10 NOTE — Discharge Instructions (Addendum)
Today you are being treated for a sinus infection  Take azithromycin as directed on packaging  You may use saline irrigation to help manage congestion  May Continue use of ibuprofen as well as Tylenol for management of your discomfort  You may also use humidifier at bedtime if you have 1 to help keep the airways moistened, if you do not you make steam up your bathroom and sit inside for 10 to 15 minutes prior to bed and in the mornings  If your headaches continue to persist but do not worsen you may follow-up with your primary doctor for reevaluation  At any point if you begin to have the worst headache of your life please go to the nearest emergency department for further evaluation

## 2021-11-10 NOTE — ED Triage Notes (Signed)
Hemochromatosis hx. Has genetic mutation and storing platelets. Reports had run out of medications for it for 4 days then started it back. Reports few days ago inside of nose was packed with dry mucous and headaches.  Took ibuprofen 800 mg which helped headaches. Had sneezing attack last night and lots of mucous. Reports headache has been ongoing for 4-5 days now.

## 2021-11-12 ENCOUNTER — Telehealth: Payer: Self-pay | Admitting: Hematology and Oncology

## 2021-11-12 ENCOUNTER — Inpatient Hospital Stay: Payer: Medicare Other

## 2021-11-12 ENCOUNTER — Encounter (HOSPITAL_COMMUNITY): Payer: Medicare Other

## 2021-11-12 NOTE — Telephone Encounter (Signed)
Per 7/28 phone line pt called to r/s appointment.  R/s appointment per pt request

## 2021-11-15 IMAGING — US US ABDOMEN COMPLETE
1 series · 15 of 25 positions shown · non-contrast
Comparison: None.

CLINICAL DATA: Right-sided abdominal pain

EXAM:
ABDOMEN ULTRASOUND COMPLETE

[Series 1: us abdomen complete mc & wl · 15 of 116 slices shown]
[im 1/116]
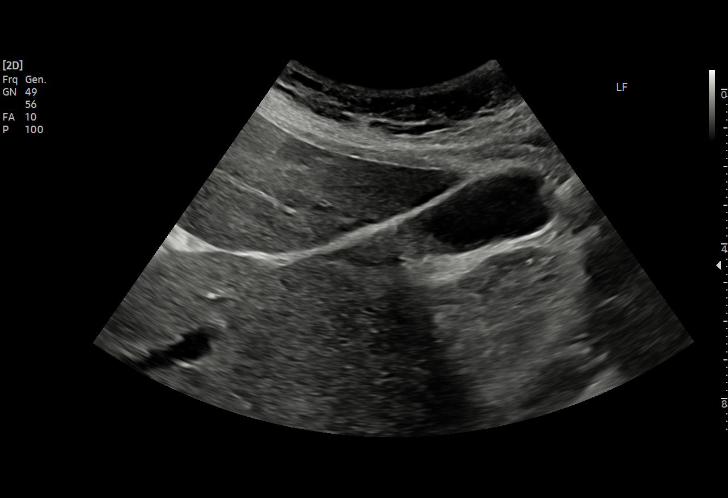
[im 10/116]
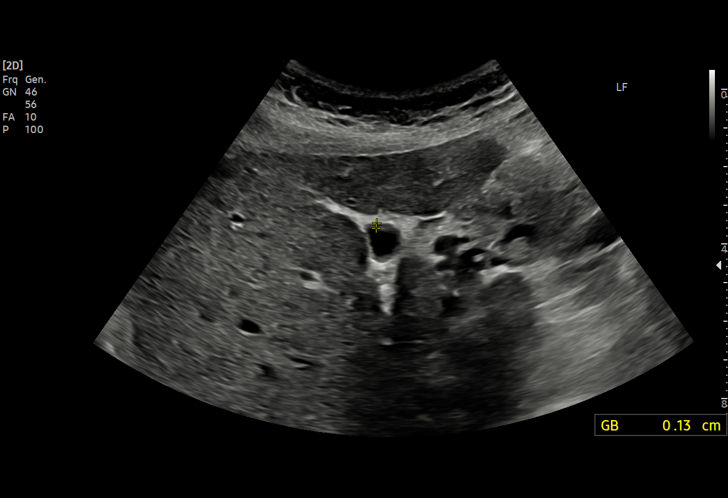
[im 20/116]
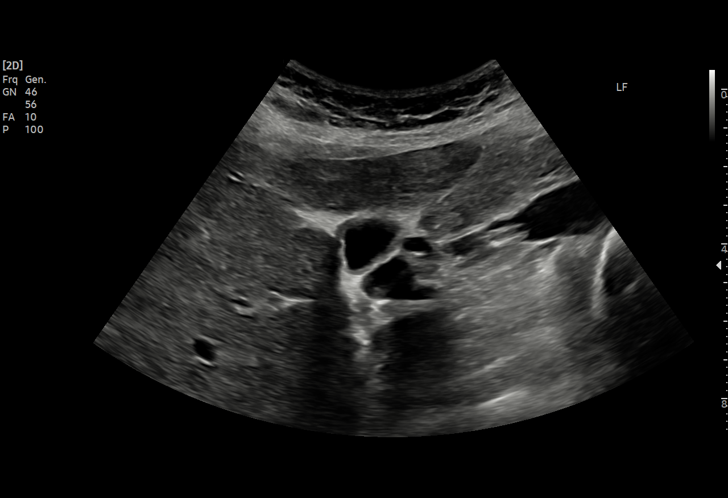
[im 24/116]
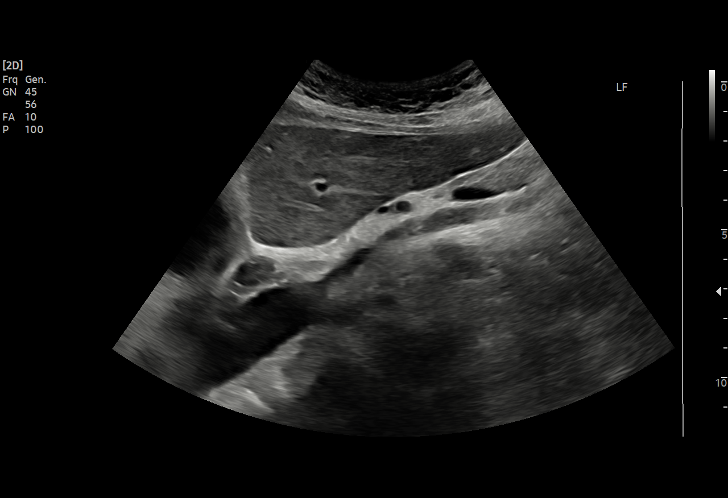
[im 34/116]
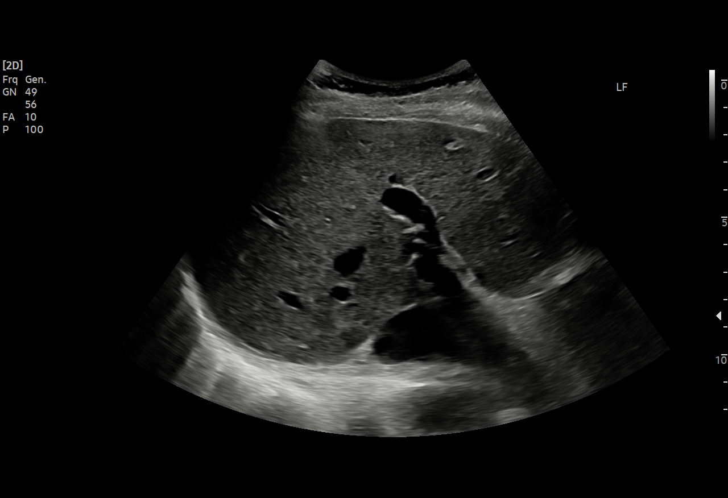
[im 44/116]
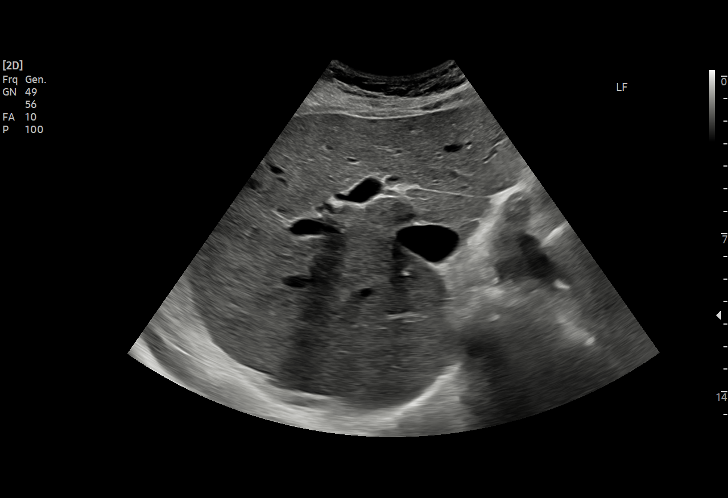
[im 48/116]
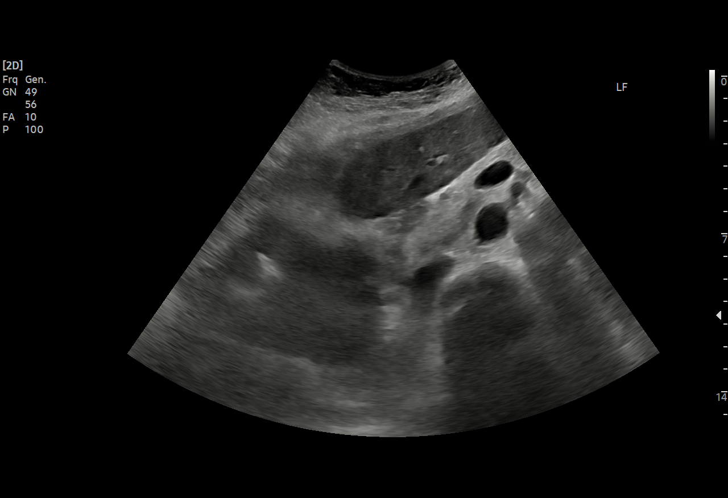
[im 58/116]
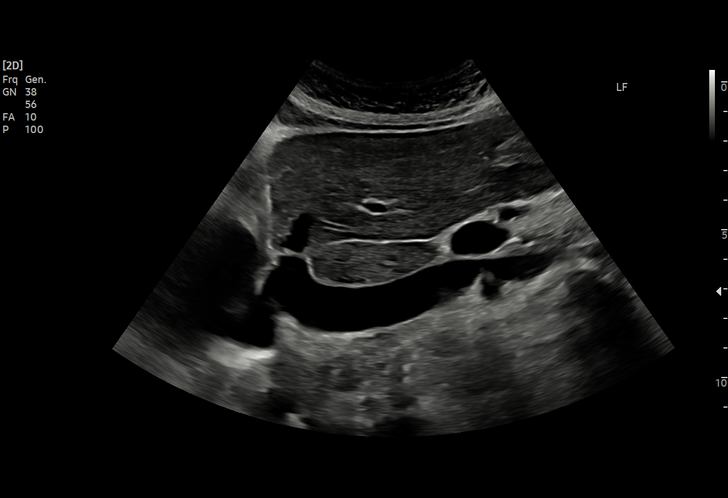
[im 68/116]
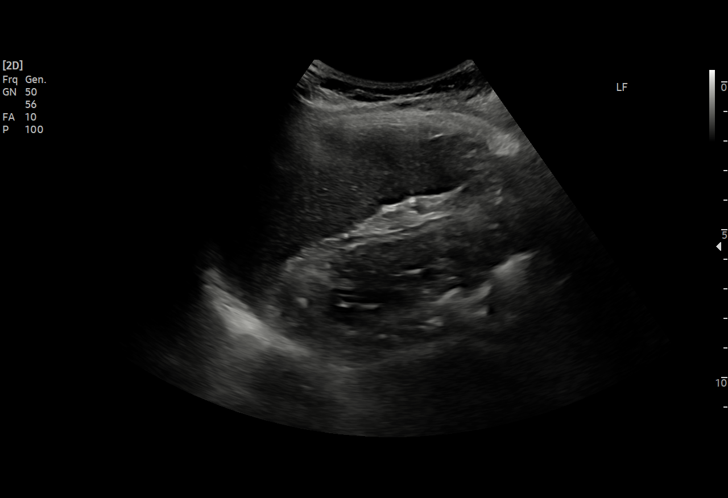
[im 72/116]
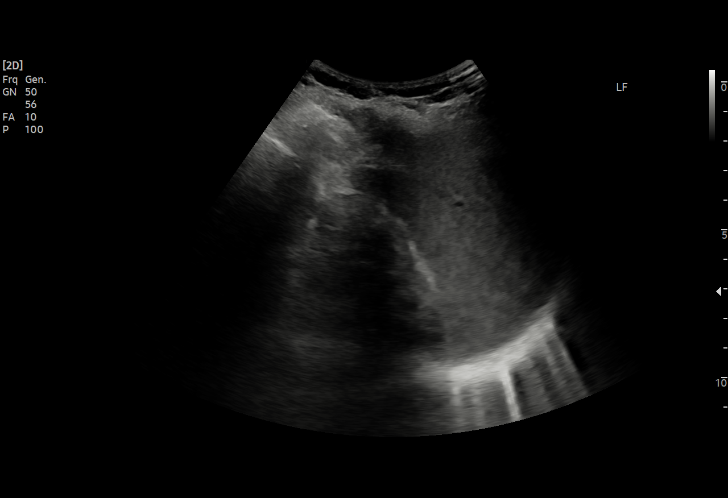
[im 82/116]
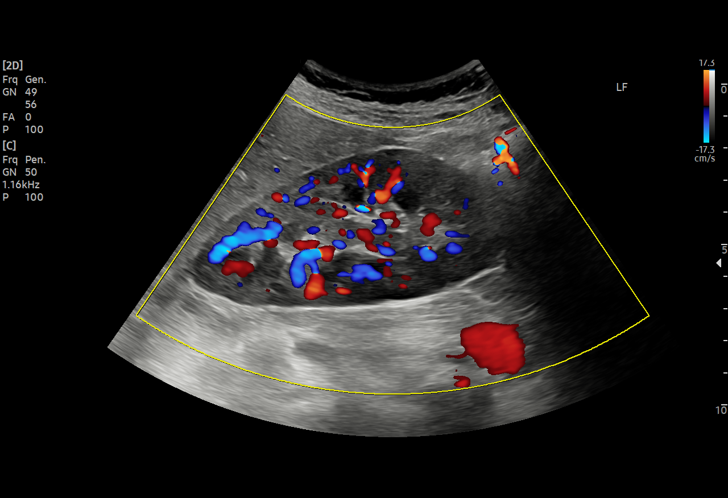
[im 92/116]
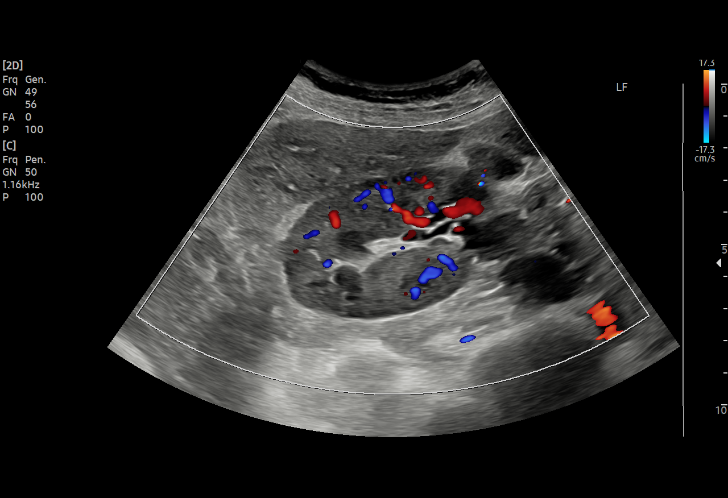
[im 96/116]
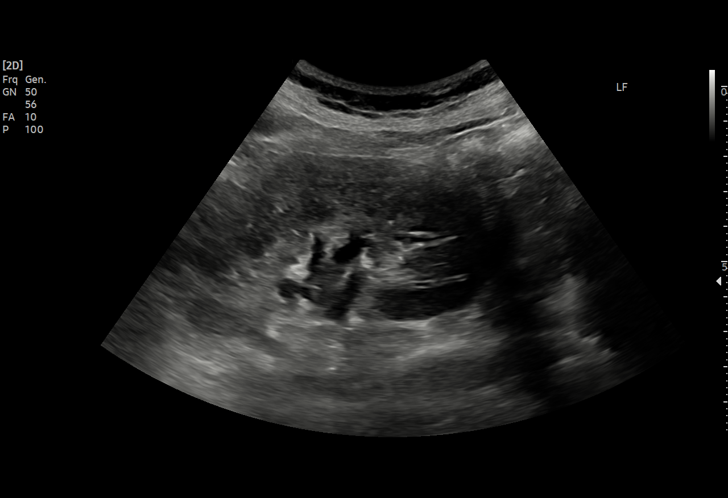
[im 106/116]
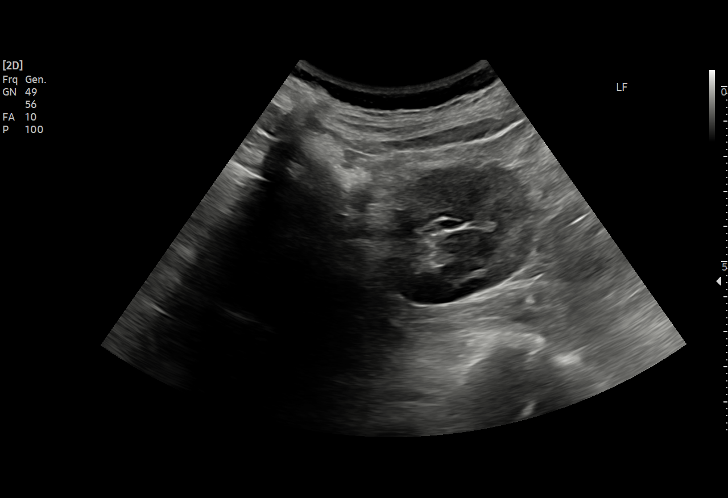
[im 116/116]
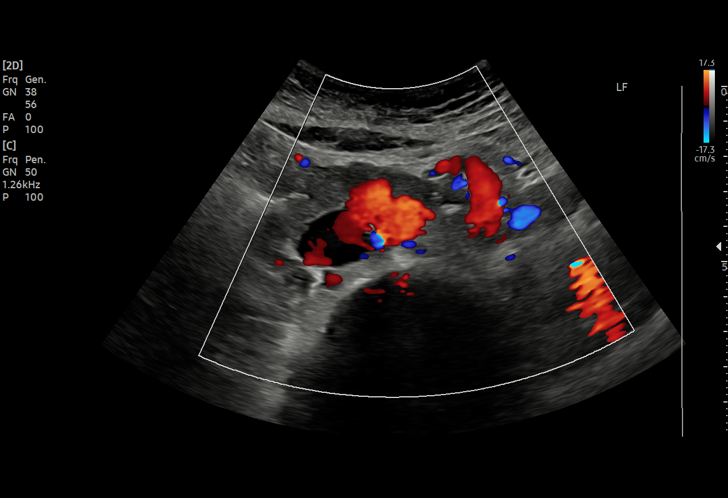

[15 of 25 positions shown; findings below may reference images not displayed]

FINDINGS: Gallbladder: No gallstones or wall thickening visualized. No
sonographic Murphy sign noted by sonographer.

Common bile duct: Diameter: 4.1 mm

Liver: No focal lesion identified. Within normal limits in
parenchymal echogenicity. Portal vein is patent on color Doppler
imaging with normal direction of blood flow towards the liver.

IVC: No abnormality visualized.

Pancreas: Visualized portion unremarkable.

Spleen: Size and appearance within normal limits.

Right Kidney: Length: 10.8 cm. Echogenicity within normal limits. No
mass or hydronephrosis visualized.

Left Kidney: Length: 10.9 cm. Echogenicity within normal limits. No
mass or hydronephrosis visualized.

Abdominal aorta: No aneurysm visualized.

Other findings: None.
IMPRESSION: Negative ultrasound of the abdomen.

## 2021-11-16 ENCOUNTER — Encounter: Payer: Self-pay | Admitting: Hematology and Oncology

## 2021-11-17 ENCOUNTER — Inpatient Hospital Stay: Payer: Medicare Other

## 2021-11-17 ENCOUNTER — Telehealth: Payer: Self-pay | Admitting: Hematology and Oncology

## 2021-11-17 ENCOUNTER — Ambulatory Visit (HOSPITAL_COMMUNITY)
Admission: RE | Admit: 2021-11-17 | Discharge: 2021-11-17 | Disposition: A | Discharge status: Home / Self Care | Payer: Medicare Other | Source: Ambulatory Visit | Attending: Internal Medicine | Admitting: Internal Medicine

## 2021-11-17 ENCOUNTER — Ambulatory Visit (HOSPITAL_COMMUNITY)
Admission: RE | Admit: 2021-11-17 | Discharge: 2021-11-17 | Disposition: A | Discharge status: Home / Self Care | Payer: Medicare Other | Source: Ambulatory Visit | Attending: Cardiology | Admitting: Cardiology

## 2021-11-17 DIAGNOSIS — R002 Palpitations: Secondary | ICD-10-CM

## 2021-11-17 NOTE — Telephone Encounter (Signed)
Per 8/2 phone line pt called to r/s appointment   r/s per pt request

## 2021-11-19 ENCOUNTER — Encounter: Payer: Self-pay | Admitting: Hematology and Oncology

## 2021-11-19 ENCOUNTER — Inpatient Hospital Stay: Payer: Medicare Other | Attending: Hematology and Oncology

## 2021-11-19 ENCOUNTER — Inpatient Hospital Stay: Payer: Medicare Other

## 2021-11-19 DIAGNOSIS — D473 Essential (hemorrhagic) thrombocythemia: Secondary | ICD-10-CM | POA: Insufficient documentation

## 2021-11-19 DIAGNOSIS — D75839 Thrombocytosis, unspecified: Secondary | ICD-10-CM | POA: Insufficient documentation

## 2021-11-19 DIAGNOSIS — R002 Palpitations: Secondary | ICD-10-CM | POA: Insufficient documentation

## 2021-11-19 LAB — CBC WITH DIFFERENTIAL (CANCER CENTER ONLY)
Abs Immature Granulocytes: 0.02 10*3/uL (ref 0.00–0.07)
Basophils Absolute: 0.1 10*3/uL (ref 0.0–0.1)
Basophils Relative: 1 %
Eosinophils Absolute: 0.3 10*3/uL (ref 0.0–0.5)
Eosinophils Relative: 3 %
HCT: 39.6 % (ref 36.0–46.0)
Hemoglobin: 13.3 g/dL (ref 12.0–15.0)
Immature Granulocytes: 0 %
Lymphocytes Relative: 25 %
Lymphs Abs: 2.5 10*3/uL (ref 0.7–4.0)
MCH: 28.7 pg (ref 26.0–34.0)
MCHC: 33.6 g/dL (ref 30.0–36.0)
MCV: 85.3 fL (ref 80.0–100.0)
Monocytes Absolute: 0.7 10*3/uL (ref 0.1–1.0)
Monocytes Relative: 7 %
Neutro Abs: 6.6 10*3/uL (ref 1.7–7.7)
Neutrophils Relative %: 64 %
Platelet Count: 703 10*3/uL — ABNORMAL HIGH (ref 150–400)
RBC: 4.64 MIL/uL (ref 3.87–5.11)
RDW: 18.8 % — ABNORMAL HIGH (ref 11.5–15.5)
WBC Count: 10.2 10*3/uL (ref 4.0–10.5)
nRBC: 0 % (ref 0.0–0.2)

## 2021-11-19 LAB — IRON AND IRON BINDING CAPACITY (CC-WL,HP ONLY)
Iron: 180 ug/dL — ABNORMAL HIGH (ref 28–170)
Saturation Ratios: 73 % — ABNORMAL HIGH (ref 10.4–31.8)
TIBC: 248 ug/dL — ABNORMAL LOW (ref 250–450)
UIBC: 68 ug/dL — ABNORMAL LOW (ref 148–442)

## 2021-11-19 LAB — FERRITIN: Ferritin: 24 ng/mL (ref 11–307)

## 2021-11-19 MED ORDER — SODIUM CHLORIDE 0.9 % IV SOLN: Freq: Once | INTRAVENOUS | Status: AC

## 2021-11-19 NOTE — Patient Instructions (Signed)

## 2021-11-19 NOTE — Progress Notes (Signed)
Patient does not have a qualifying diagnosis for [employer].

## 2021-11-19 NOTE — Progress Notes (Signed)
Kara [name] presents today for phlebotomy per MD orders. Phlebotomy procedure started at 1643 and ended at 1655. 519 cc removed. Patient tolerated procedure well. Food and drink offered and accepted. IV needle removed intact.

## 2021-11-25 ENCOUNTER — Encounter: Payer: Self-pay | Admitting: Hematology and Oncology

## 2021-11-26 ENCOUNTER — Inpatient Hospital Stay (HOSPITAL_BASED_OUTPATIENT_CLINIC_OR_DEPARTMENT_OTHER): Payer: Medicare Other | Admitting: Hematology and Oncology

## 2021-11-26 DIAGNOSIS — D473 Essential (hemorrhagic) thrombocythemia: Secondary | ICD-10-CM

## 2021-11-26 MED ORDER — HYDROXYUREA 500 MG PO CAPS: 500.0000 mg | ORAL_CAPSULE | ORAL | 2 refills | Status: DC

## 2021-11-26 MED ORDER — ANAGRELIDE HCL 1 MG PO CAPS: 1.0000 mg | ORAL_CAPSULE | ORAL | 3 refills | Status: DC

## 2021-11-26 NOTE — Progress Notes (Signed)
HEMATOLOGY-ONCOLOGY TELEPHONE VISIT PROGRESS NOTE  I connected with our patient on 11/26/21 at  8:15 AM EDT by telephone and verified that I am speaking with the correct person using two identifiers.  I discussed the limitations, risks, security and privacy concerns of performing an evaluation and management service by telephone and the availability of in person appointments.  I also discussed with the patient that there may be a patient responsible charge related to this service. The patient expressed understanding and agreed to proceed.   History of Present Illness: Severe palpitations from anagrelide resulting in urgent care visit    REVIEW OF SYSTEMS:   Constitutional: Denies fevers, chills or abnormal weight loss All other systems were reviewed with the patient and are negative. Observations/Objective:     Assessment Plan:  Essential thrombocytosis ([place]) Current treatment: Anagrelide started 05/06/21 (Prior treatment hydroxyurea: Discontinued because of side effects including drooling around the mouth)   Lab: 06/18/21:  Hb 11.2, Platelet 778 07/30/2021: Hemoglobin 12.9, platelets 783, iron saturation 13%, ferritin 7  10/01/2021: Hemoglobin 14.1, platelets 828, ferritin 22 11/02/2021: Hemoglobin 12.9, platelets 435, ferritin 22, iron saturation 54%  10/04/21: Increased anagrelide to Bid 11/26/21: Started alternating Anagrelide with Hydrea  Severe palpitations: It started about an hour after taking anagrelide and lasts for 2 to 3 hours afterwards.  She did not have this when she was on 1 tablet of anagrelide.  Plan: 1.  Reduce anagrelide to 1 tablet every other day 2. alternate with hydroxyurea 1 tablet every other day  She has lab appointment on 12/02/2021. We can add an MD visit to that appointment.  However she will inform us of when another appointment that she has at [facility] is taking place.    I discussed the assessment and treatment plan with the patient. The patient was provided  an opportunity to ask questions and all were answered. The patient agreed with the plan and demonstrated an understanding of the instructions. The patient was advised to call back or seek an in-person evaluation if the symptoms worsen or if the condition fails to improve as anticipated.   I provided 12 minutes of non-face-to-face time during this encounter.  This includes time for charting and coordination of care   Harriette Ohara, MD

## 2021-11-26 NOTE — Assessment & Plan Note (Signed)
Current treatment: Anagrelidestarted 05/06/21 (Prior treatment hydroxyurea: Discontinued because of side effects including drooling around the mouth)  Lab: 06/18/21: Hb 11.2, Platelet 778 07/30/2021: Hemoglobin 12.9, platelets 783, iron saturation 13%, ferritin7 10/01/2021: Hemoglobin 14.1, platelets 828, ferritin 22 11/02/2021: Hemoglobin 12.9, platelets 435, ferritin 22, iron saturation 54% 10/04/21: Increased anagrelide to Bid 11/26/21: Started alternating Anagrelide with Hydrea  Severe palpitations: It started about an hour after taking anagrelide and lasts for 2 to 3 hours afterwards.  She did not have this when she was on 1 tablet of anagrelide.  Plan: 1.  Reduce anagrelide to 1 tablet every other day 2. alternate with hydroxyurea 1 tablet every other day  She has lab appointment on 12/02/2021. We can add an MD visit to that appointment.  However she will inform us of when another appointment that she has at [facility] is taking place.

## 2021-12-02 ENCOUNTER — Inpatient Hospital Stay: Payer: Medicare Other

## 2021-12-02 ENCOUNTER — Other Ambulatory Visit: Payer: Self-pay

## 2021-12-02 DIAGNOSIS — D75839 Thrombocytosis, unspecified: Secondary | ICD-10-CM | POA: Diagnosis not present

## 2021-12-02 DIAGNOSIS — D473 Essential (hemorrhagic) thrombocythemia: Secondary | ICD-10-CM | POA: Diagnosis not present

## 2021-12-02 DIAGNOSIS — R002 Palpitations: Secondary | ICD-10-CM | POA: Diagnosis not present

## 2021-12-02 LAB — CBC WITH DIFFERENTIAL (CANCER CENTER ONLY)
Abs Immature Granulocytes: 0.03 10*3/uL (ref 0.00–0.07)
Basophils Absolute: 0.1 10*3/uL (ref 0.0–0.1)
Basophils Relative: 1 %
Eosinophils Absolute: 0.3 10*3/uL (ref 0.0–0.5)
Eosinophils Relative: 3 %
HCT: 35.5 % — ABNORMAL LOW (ref 36.0–46.0)
Hemoglobin: 11.9 g/dL — ABNORMAL LOW (ref 12.0–15.0)
Immature Granulocytes: 0 %
Lymphocytes Relative: 23 %
Lymphs Abs: 2.5 10*3/uL (ref 0.7–4.0)
MCH: 29.2 pg (ref 26.0–34.0)
MCHC: 33.5 g/dL (ref 30.0–36.0)
MCV: 87.2 fL (ref 80.0–100.0)
Monocytes Absolute: 0.7 10*3/uL (ref 0.1–1.0)
Monocytes Relative: 7 %
Neutro Abs: 7 10*3/uL (ref 1.7–7.7)
Neutrophils Relative %: 66 %
Platelet Count: 816 10*3/uL — ABNORMAL HIGH (ref 150–400)
RBC: 4.07 MIL/uL (ref 3.87–5.11)
RDW: 18.7 % — ABNORMAL HIGH (ref 11.5–15.5)
WBC Count: 10.5 10*3/uL (ref 4.0–10.5)
nRBC: 0 % (ref 0.0–0.2)

## 2021-12-02 LAB — IRON AND IRON BINDING CAPACITY (CC-WL,HP ONLY)
Iron: 142 ug/dL (ref 28–170)
Saturation Ratios: 48 % — ABNORMAL HIGH (ref 10.4–31.8)
TIBC: 298 ug/dL (ref 250–450)
UIBC: 156 ug/dL

## 2021-12-02 NOTE — Progress Notes (Signed)
HEMATOLOGY-ONCOLOGY TELEPHONE VISIT PROGRESS NOTE  I connected with our patient on 12/03/21 at  8:00 AM EDT by telephone and verified that I am speaking with the correct person using two identifiers.  I discussed the limitations, risks, security and privacy concerns of performing an evaluation and management service by telephone and the availability of in person appointments.  I also discussed with the patient that there may be a patient responsible charge related to this service. The patient expressed understanding and agreed to proceed.   History of Present Illness: Follow-up Severe palpitations from anagrelide. She presents to the clinic today for a telephone follow-up.   REVIEW OF SYSTEMS:   Constitutional: Denies fevers, chills or abnormal weight loss All other systems were reviewed with the patient and are negative. Observations/Objective:     Assessment Plan:  Essential thrombocytosis ([place]) Current treatment: Anagrelide started 05/06/21 (Prior treatment hydroxyurea: Discontinued because of side effects including drooling around the mouth)   Lab: 06/18/21:  Hb 11.2, Platelet 778 07/30/2021: Hemoglobin 12.9, platelets 783, iron saturation 13%, ferritin 7  10/01/2021: Hemoglobin 14.1, platelets 828, ferritin 22 11/02/2021: Hemoglobin 12.9, platelets 435, ferritin 22, iron saturation 54%  10/04/21: Increased anagrelide to Bid 11/26/21: Started alternating Anagrelide with Hydrea  12/02/2021: Hemoglobin 11.9, platelets 816, iron saturation 48%, ferritin pending   Severe palpitations: It started about an hour after taking anagrelide and lasts for 2 to 3 hours afterwards.  She did not have this when she was on 1 tablet of anagrelide.   Plan: She will now start taking anagrelide in the morning and Hydrea in the evening. Awaiting the results of the heart monitoring Labs in 2 weeks.    I discussed the assessment and treatment plan with the patient. The patient was provided an opportunity to  ask questions and all were answered. The patient agreed with the plan and demonstrated an understanding of the instructions. The patient was advised to call back or seek an in-person evaluation if the symptoms worsen or if the condition fails to improve as anticipated.   I provided 12 minutes of non-face-to-face time during this encounter.  This includes time for charting and coordination of care   Harriette Ohara, MD   I Gardiner Coins am scribing for Dr. Lindi Adie  I have reviewed the above documentation for accuracy and completeness, and I agree with the above.

## 2021-12-03 ENCOUNTER — Inpatient Hospital Stay (HOSPITAL_BASED_OUTPATIENT_CLINIC_OR_DEPARTMENT_OTHER): Payer: Medicare Other | Admitting: Hematology and Oncology

## 2021-12-03 DIAGNOSIS — D473 Essential (hemorrhagic) thrombocythemia: Secondary | ICD-10-CM | POA: Diagnosis not present

## 2021-12-03 LAB — FERRITIN: Ferritin: 18 ng/mL (ref 11–307)

## 2021-12-03 NOTE — Assessment & Plan Note (Signed)
Current treatment: Anagrelidestarted 05/06/21 (Prior treatment hydroxyurea: Discontinued because of side effects including drooling around the mouth)  Lab: 06/18/21: Hb 11.2, Platelet 778 07/30/2021: Hemoglobin 12.9, platelets 783, iron saturation 13%, ferritin7 10/01/2021: Hemoglobin 14.1, platelets 828, ferritin 22 11/02/2021: Hemoglobin 12.9, platelets 435, ferritin 22, iron saturation 54% 10/04/21: Increased anagrelide to Bid 11/26/21: Started alternating Anagrelide with Hydrea  12/02/2021: Hemoglobin 11.9, platelets 816, iron saturation 48%, ferritin pending  Severe palpitations: It started about an hour after taking anagrelide and lasts for 2 to 3 hours afterwards.  She did not have this when she was on 1 tablet of anagrelide.  Plan: 1.  Reduce anagrelide to 1 tablet every other day 2. alternate with hydroxyurea 1 tablet every other day

## 2021-12-06 ENCOUNTER — Telehealth: Payer: Self-pay | Admitting: Hematology and Oncology

## 2021-12-06 NOTE — Telephone Encounter (Signed)
Scheduled appointment per 8/18 los. Patient is aware. 

## 2021-12-08 DIAGNOSIS — R002 Palpitations: Secondary | ICD-10-CM | POA: Diagnosis not present

## 2021-12-10 NOTE — Addendum Note (Signed)
Encounter addended by: Micki Riley, RN on: 12/10/2021 10:10 AM  Actions taken: Imaging Exam ended

## 2021-12-15 ENCOUNTER — Telehealth: Payer: Self-pay | Admitting: Hematology and Oncology

## 2021-12-15 NOTE — Progress Notes (Incomplete)
ADVANCED [facility] CLINIC CONSULT NOTE  Referring Physician:Dr. Lindi Adie Primary Care: Pcp, No Primary Cardiologist:New  HPI:  71 y/o woman with essential thrombocytosis and hereditary hemochromatosis referred by Dr. Lindi Adie for severe palpitations that started after initiation of angralide   Severe palpitations: It started about an hour after taking anagrelide and lasts for 2 to 3 hours afterwards.  She did not have this when she was on 1 tablet of anagrelide.  Echo 5/22/122 EF 60-65% RVSP 27mHg   Zio 8/23 - Sinus rhythm. 1 six-beat run NSVT, 180 runs of SVT longest 11.5 seconds. Rare PACs and PVCs   Review of Systems: [y] = yes, '[ ]'$  = no   General: Weight gain '[ ]'$ ; Weight loss '[ ]'$ ; Anorexia '[ ]'$ ; Fatigue '[ ]'$ ; Fever '[ ]'$ ; Chills '[ ]'$ ; Weakness '[ ]'$   Cardiac: Chest pain/pressure '[ ]'$ ; Resting SOB '[ ]'$ ; Exertional SOB '[ ]'$ ; Orthopnea '[ ]'$ ; Pedal Edema '[ ]'$ ; Palpitations '[ ]'$ ; Syncope '[ ]'$ ; Presyncope '[ ]'$ ; Paroxysmal nocturnal dyspnea'[ ]'$   Pulmonary: Cough '[ ]'$ ; Wheezing'[ ]'$ ; Hemoptysis'[ ]'$ ; Sputum '[ ]'$ ; Snoring '[ ]'$   GI: Vomiting'[ ]'$ ; Dysphagia'[ ]'$ ; Melena'[ ]'$ ; Hematochezia '[ ]'$ ; Heartburn'[ ]'$ ; Abdominal pain '[ ]'$ ; Constipation '[ ]'$ ; Diarrhea '[ ]'$ ; BRBPR '[ ]'$   GU: Hematuria'[ ]'$ ; Dysuria '[ ]'$ ; Nocturia'[ ]'$   Vascular: Pain in legs with walking '[ ]'$ ; Pain in feet with lying flat '[ ]'$ ; Non-healing sores '[ ]'$ ; Stroke '[ ]'$ ; TIA '[ ]'$ ; Slurred speech '[ ]'$ ;  Neuro: Headaches'[ ]'$ ; Vertigo'[ ]'$ ; Seizures'[ ]'$ ; Paresthesias'[ ]'$ ;Blurred vision '[ ]'$ ; Diplopia '[ ]'$ ; Vision changes '[ ]'$   Ortho/Skin: Arthritis '[ ]'$ ; Joint pain '[ ]'$ ; Muscle pain '[ ]'$ ; Joint swelling '[ ]'$ ; Back Pain '[ ]'$ ; Rash '[ ]'$   Psych: Depression'[ ]'$ ; Anxiety'[ ]'$   Heme: Bleeding problems '[ ]'$ ; Clotting disorders '[ ]'$ ; Anemia '[ ]'$   Endocrine: Diabetes '[ ]'$ ; Thyroid dysfunction'[ ]'$    Past Medical History:  Diagnosis Date   Hemochromatosis    Migraine     Current Outpatient Medications  Medication Sig Dispense Refill   anagrelide (AGRYLIN) 1 MG capsule Take 1 capsule (1 mg total) by mouth  every other day. 60 capsule 3   aspirin EC 81 MG tablet Take 1 tablet (81 mg total) by mouth daily.     hydroxyurea (HYDREA) 500 MG capsule Take 1 capsule (500 mg total) by mouth every other day. May take with food to minimize GI side effects. 60 capsule 2   No current facility-administered medications for this encounter.    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   Caffeine use: 4 cups per day.   Social Determinants of Health   Financial Resource Strain: Not on file  Food Insecurity: Not on file  Transportation Needs: Not on file  Physical Activity: Not on file  Stress: Not on file  Social Connections: Not on file  Intimate Partner Violence: Not on file      Family History  Problem Relation Age of Onset   Healthy Mother    Acute myelogenous leukemia Father    Bipolar disorder Sister    Diabetes Neg Hx    Cancer Neg Hx    Heart failure Neg Hx    Hyperlipidemia Neg Hx    Hypertension Neg Hx     There were no vitals filed for this visit.  PHYSICAL EXAM: General:  Well appearing. No respiratory difficulty HEENT: normal Neck: supple. no JVD. Carotids 2+ bilat; no bruits. No lymphadenopathy or thryomegaly  appreciated. Cor: PMI nondisplaced. Regular rate & rhythm. No rubs, gallops or murmurs. Lungs: clear Abdomen: soft, nontender, nondistended. No hepatosplenomegaly. No bruits or masses. Good bowel sounds. Extremities: no cyanosis, clubbing, rash, edema Neuro: alert & oriented x 3, cranial nerves grossly intact. moves all 4 extremities w/o difficulty. Affect pleasant.  ECG:   ASSESSMENT & PLAN: 1. Palpitations - Echo 5/22/122 EF 60-65% RVSP 98mHg  -Zio 8/23 - Sinus rhythm. 1 six-beat run NSVT, 180 runs of SVT longest 11.5 seconds. Rare PACs and PVCs

## 2021-12-15 NOTE — Telephone Encounter (Signed)
Per 8/30 phone line pt called to r/s lab appointment

## 2021-12-16 ENCOUNTER — Encounter (HOSPITAL_COMMUNITY): Payer: Self-pay | Admitting: Internal Medicine

## 2021-12-16 ENCOUNTER — Other Ambulatory Visit (HOSPITAL_COMMUNITY): Payer: Self-pay | Admitting: Internal Medicine

## 2021-12-16 ENCOUNTER — Ambulatory Visit (HOSPITAL_COMMUNITY)
Admission: RE | Admit: 2021-12-16 | Discharge: 2021-12-16 | Disposition: A | Discharge status: Home / Self Care | Payer: Medicare Other | Source: Ambulatory Visit | Attending: Internal Medicine | Admitting: Internal Medicine

## 2021-12-16 ENCOUNTER — Inpatient Hospital Stay: Payer: Medicare Other

## 2021-12-16 VITALS — BP 120/70 | HR 72 | Wt 132.8 lb

## 2021-12-16 DIAGNOSIS — I471 Supraventricular tachycardia: Secondary | ICD-10-CM | POA: Diagnosis not present

## 2021-12-16 DIAGNOSIS — D75839 Thrombocytosis, unspecified: Secondary | ICD-10-CM | POA: Diagnosis not present

## 2021-12-16 DIAGNOSIS — F419 Anxiety disorder, unspecified: Secondary | ICD-10-CM | POA: Insufficient documentation

## 2021-12-16 DIAGNOSIS — G4719 Other hypersomnia: Secondary | ICD-10-CM | POA: Diagnosis not present

## 2021-12-16 DIAGNOSIS — R0683 Snoring: Secondary | ICD-10-CM | POA: Diagnosis not present

## 2021-12-16 DIAGNOSIS — Z87891 Personal history of nicotine dependence: Secondary | ICD-10-CM | POA: Diagnosis not present

## 2021-12-16 DIAGNOSIS — I493 Ventricular premature depolarization: Secondary | ICD-10-CM | POA: Insufficient documentation

## 2021-12-16 DIAGNOSIS — R002 Palpitations: Secondary | ICD-10-CM | POA: Insufficient documentation

## 2021-12-16 DIAGNOSIS — D473 Essential (hemorrhagic) thrombocythemia: Secondary | ICD-10-CM | POA: Insufficient documentation

## 2021-12-16 LAB — CBC WITH DIFFERENTIAL (CANCER CENTER ONLY)
Abs Immature Granulocytes: 0.02 10*3/uL (ref 0.00–0.07)
Basophils Absolute: 0 10*3/uL (ref 0.0–0.1)
Basophils Relative: 0 %
Eosinophils Absolute: 0.3 10*3/uL (ref 0.0–0.5)
Eosinophils Relative: 3 %
HCT: 37.8 % (ref 36.0–46.0)
Hemoglobin: 12.9 g/dL (ref 12.0–15.0)
Immature Granulocytes: 0 %
Lymphocytes Relative: 21 %
Lymphs Abs: 1.9 10*3/uL (ref 0.7–4.0)
MCH: 30.6 pg (ref 26.0–34.0)
MCHC: 34.1 g/dL (ref 30.0–36.0)
MCV: 89.8 fL (ref 80.0–100.0)
Monocytes Absolute: 0.5 10*3/uL (ref 0.1–1.0)
Monocytes Relative: 6 %
Neutro Abs: 6.4 10*3/uL (ref 1.7–7.7)
Neutrophils Relative %: 70 %
Platelet Count: 632 10*3/uL — ABNORMAL HIGH (ref 150–400)
RBC: 4.21 MIL/uL (ref 3.87–5.11)
RDW: 19.2 % — ABNORMAL HIGH (ref 11.5–15.5)
WBC Count: 9.1 10*3/uL (ref 4.0–10.5)
nRBC: 0 % (ref 0.0–0.2)

## 2021-12-16 LAB — FERRITIN: Ferritin: 17 ng/mL (ref 11–307)

## 2021-12-16 NOTE — Addendum Note (Signed)
Encounter addended by: Scarlette Calico, RN on: 12/16/2021 12:33 PM  Actions taken: Clinical Note Signed

## 2021-12-16 NOTE — Patient Instructions (Signed)
STOP Angrelide  Your physician recommends that you return for lab work in: today at [place] provider has recommended that  you wear a Zio Patch for 14 days.  This monitor will record your heart rhythm for our review.  IF you have any symptoms while wearing the monitor please press the button.  If you have any issues with the patch or you notice a red or orange light on it please call the company at [phone].  Once you remove the patch please mail it back to the company as soon as possible so we can get the results.  Your provider has recommended that you have a home sleep study.  We have provided you with the equipment in our office today. Please download the app and follow the instructions. YOUR PIN NUMBER IS: [id number]. Once you have completed the test you just dispose of the equipment, the information is automatically uploaded to [place] via blue-tooth technology. If your test is positive for sleep apnea and you need a home CPAP machine you will be contacted by Dr Theodosia Blender office [facility]) to set this up.  Your physician has requested that you have a cardiac MRI. Cardiac MRI uses a computer to create images of your heart as its beating, producing both still and moving pictures of your heart and major blood vessels. For further information please visit [email or web address] Please follow the instruction sheet given to you today for more information.  Your physician recommends that you schedule a follow-up appointment in: 4 months

## 2021-12-16 NOTE — Progress Notes (Signed)
Height:  5'1"    Weight: 132 lbs BMI: 25  Today's Date: 12/16/21  STOP BANG RISK ASSESSMENT S (snore) Have you been told that you snore?     YES   T (tired) Are you often tired, fatigued, or sleepy during the day?   YES  O (obstruction) Do you stop breathing, choke, or gasp during sleep? NO   P (pressure) Do you have or are you being treated for high blood pressure? NO   B (BMI) Is your body index greater than 35 kg/m? NO   A (age) Are you 71 years old or older? YES   N (neck) Do you have a neck circumference greater than 16 inches?      G (gender) Are you a female? NO   TOTAL STOP/BANG "YES" ANSWERS 3                                                                       For Office Use Only              Procedure Order Form    YES to 3+ Stop Bang questions OR two clinical symptoms - patient qualifies for Watch[employer] (CPT 95800)      Clinical Notes: Will consult Sleep Specialist and refer for management of therapy due to patient increased risk of Sleep Apnea. Ordering a sleep study due to the following two clinical symptoms: Excessive daytime sleepiness G47.10 /  Loud snoring R06.83

## 2021-12-17 ENCOUNTER — Other Ambulatory Visit: Payer: Medicare Other

## 2021-12-17 ENCOUNTER — Telehealth: Payer: Self-pay | Admitting: *Deleted

## 2021-12-17 LAB — COMPREHENSIVE METABOLIC PANEL
ALT: 10 IU/L (ref 0–32)
AST: 17 IU/L (ref 0–40)
Albumin/Globulin Ratio: 2.3 — ABNORMAL HIGH (ref 1.2–2.2)
Albumin: 4.8 g/dL (ref 3.9–4.9)
Alkaline Phosphatase: 69 IU/L (ref 44–121)
BUN/Creatinine Ratio: 23 (ref 12–28)
BUN: 13 mg/dL (ref 8–27)
Bilirubin Total: 0.2 mg/dL (ref 0.0–1.2)
CO2: 21 mmol/L (ref 20–29)
Calcium: 9.6 mg/dL (ref 8.7–10.3)
Chloride: 106 mmol/L (ref 96–106)
Creatinine, Ser: 0.57 mg/dL (ref 0.57–1.00)
Globulin, Total: 2.1 g/dL (ref 1.5–4.5)
Glucose: 100 mg/dL — ABNORMAL HIGH (ref 70–99)
Potassium: 4.9 mmol/L (ref 3.5–5.2)
Sodium: 142 mmol/L (ref 134–144)
Total Protein: 6.9 g/dL (ref 6.0–8.5)
eGFR: 98 mL/min/{1.73_m2} (ref >59)

## 2021-12-17 LAB — TSH: TSH: 0.824 u[IU]/mL (ref 0.450–4.500)

## 2021-12-17 LAB — MAGNESIUM: Magnesium: 2.3 mg/dL (ref 1.6–2.3)

## 2021-12-17 LAB — T3, FREE: T3, Free: 3.1 pg/mL (ref 2.0–4.4)

## 2021-12-17 LAB — T4, FREE: Free T4: 1.25 ng/dL (ref 0.82–1.77)

## 2021-12-17 NOTE — Telephone Encounter (Signed)
Received call from pt regarding telephone visit with Dr. Lindi Adie to discuss recent office visit with cardiology.  Pt states cardiology requesting pt to stop Agrylin.  Pt wanting to discuss Hydrea medication dose and frequency.  Telephone visit scheduled, pt verbalized understanding of appt date and time.

## 2021-12-20 NOTE — Progress Notes (Signed)
HEMATOLOGY-ONCOLOGY TELEPHONE VISIT PROGRESS NOTE  I connected with our patient on 12/21/21 at 12:00 PM EDT by telephone and verified that I am speaking with the correct person using two identifiers.  I discussed the limitations, risks, security and privacy concerns of performing an evaluation and management service by telephone and the availability of in person appointments.  I also discussed with the patient that there may be a patient responsible charge related to this service. The patient expressed understanding and agreed to proceed.   History of Present Illness: Kara [name] is a 71 y.o. female with above-mentioned history of thrombocytosis and hereditary hemochromatosis. She presents via telephone today for follow-up to discuss appointment.  REVIEW OF SYSTEMS:   Constitutional: Denies fevers, chills or abnormal weight loss All other systems were reviewed with the patient and are negative.  Assessment Plan:  Essential thrombocytosis ([place]) Current treatment: Anagrelide started 05/06/21 (Prior treatment hydroxyurea: Discontinued because of side effects including drooling around the mouth)   Lab: 06/18/21:  Hb 11.2, Platelet 778 07/30/2021: Hemoglobin 12.9, platelets 783, iron saturation 13%, ferritin 7  10/01/2021: Hemoglobin 14.1, platelets 828, ferritin 22 11/02/2021: Hemoglobin 12.9, platelets 435, ferritin 22, iron saturation 54%  10/04/21: Increased anagrelide to Bid 11/26/21: Started alternating Anagrelide with Hydrea  12/16/2021: Hemoglobin 12.9, platelets 632    Severe palpitations: Related to anagrelide. D/C anagrelide and her palpitations have mostly resolved.   Plan: Hydroxyurea 1 tablet po bid Recheck labs on 9/14 Telephone visit on 9/19 to discuss results    I discussed the assessment and treatment plan with the patient. The patient was provided an opportunity to ask questions and all were answered. The patient agreed with the plan and demonstrated an understanding of the  instructions. The patient was advised to call back or seek an in-person evaluation if the symptoms worsen or if the condition fails to improve as anticipated.   I provided 12 minutes of non-face-to-face time during this encounter.  This includes time for charting and coordination of care   Harriette Ohara, MD  I Gardiner Coins am scribing for Dr. Lindi Adie  I have reviewed the above documentation for accuracy and completeness, and I agree with the above.

## 2021-12-21 ENCOUNTER — Inpatient Hospital Stay: Payer: Medicare Other | Attending: Hematology and Oncology | Admitting: Hematology and Oncology

## 2021-12-21 DIAGNOSIS — D473 Essential (hemorrhagic) thrombocythemia: Secondary | ICD-10-CM | POA: Diagnosis not present

## 2021-12-21 NOTE — Assessment & Plan Note (Signed)
Current treatment: Anagrelidestarted 05/06/21 (Prior treatment hydroxyurea: Discontinued because of side effects including drooling around the mouth)  Lab: 06/18/21: Hb 11.2, Platelet 778 07/30/2021: Hemoglobin 12.9, platelets 783, iron saturation 13%, ferritin7 10/01/2021: Hemoglobin 14.1, platelets 828, ferritin 22 11/02/2021: Hemoglobin 12.9, platelets 435, ferritin 22, iron saturation 54% 10/04/21: Increased anagrelide to Bid 11/26/21: Started alternating Anagrelide with Hydrea  12/16/2021: Hemoglobin 12.9, platelets 632   Severe palpitations: Related to anagrelide. D/C anagrelide and her palpitations have mostly resolved.  Plan: Hydroxyurea 1 tablet po bid Recheck labs on 9/14 Telephone visit on 9/19 to discuss results

## 2021-12-22 ENCOUNTER — Telehealth (HOSPITAL_COMMUNITY): Payer: Self-pay | Admitting: Surgery

## 2021-12-22 NOTE — Telephone Encounter (Signed)
I called patient to remind her to complete the ordered home sleep study.  She plans to complete the study on Friday of this week.

## 2021-12-23 DIAGNOSIS — M9903 Segmental and somatic dysfunction of lumbar region: Secondary | ICD-10-CM | POA: Diagnosis not present

## 2021-12-23 DIAGNOSIS — M9905 Segmental and somatic dysfunction of pelvic region: Secondary | ICD-10-CM | POA: Diagnosis not present

## 2021-12-23 DIAGNOSIS — M9904 Segmental and somatic dysfunction of sacral region: Secondary | ICD-10-CM | POA: Diagnosis not present

## 2021-12-23 DIAGNOSIS — M5136 Other intervertebral disc degeneration, lumbar region: Secondary | ICD-10-CM | POA: Diagnosis not present

## 2021-12-29 NOTE — Progress Notes (Signed)
HEMATOLOGY-ONCOLOGY TELEPHONE VISIT PROGRESS NOTE  I connected with our patient on 01/04/22 at  8:15 AM EDT by telephone and verified that I am speaking with the correct person using two identifiers.  I discussed the limitations, risks, security and privacy concerns of performing an evaluation and management service by telephone and the availability of in person appointments.  I also discussed with the patient that there may be a patient responsible charge related to this service. The patient expressed understanding and agreed to proceed.   History of Present Illness: Kara [name] is a 71 y.o. female with above-mentioned history of thrombocytosis and hereditary hemochromatosis. She presents via telephone today for follow-up to discuss the results. She reports that the palpitations have reduced significantly.  She is still waiting for the results of the heart monitoring.  She does have a few side effects from the Hydrea.  She gets headaches intermittently.  She also has been noticing more hair loss.  She is concerned about her risk of blood clots with essential thrombocytosis.  REVIEW OF SYSTEMS:   Constitutional: Denies fevers, chills or abnormal weight loss All other systems were reviewed with the patient and are negative. Observations/Objective:     Assessment Plan:  Essential thrombocytosis ([place]) Lab: 06/18/21:  Hb 11.2, Platelet 778 07/30/2021: Hemoglobin 12.9, platelets 783, iron saturation 13%, ferritin 7  10/01/2021: Hemoglobin 14.1, platelets 828, ferritin 22 11/02/2021: Hemoglobin 12.9, platelets 435, ferritin 22, iron saturation 54%  10/04/21: Increased anagrelide to Bid 11/26/21: Started alternating Anagrelide with Hydrea  12/16/2021: Hemoglobin 12.9, platelets 632  (discontinued anagrelide, Hydrea 500 mg twice daily) 12/31/2021: Hemoglobin 12.9, platelets 641  Severe palpitations: Related to anagrelide. D/C anagrelide and her palpitations have mostly resolved. Waiting for heart monitor  results.   Plan: Hydroxyurea 1 tablet po bid Adverse Effects: Hair loss, headache For the time being we will keep the same dose. If it further improves, we can reduce the dosage.    I discussed the assessment and treatment plan with the patient. The patient was provided an opportunity to ask questions and all were answered. The patient agreed with the plan and demonstrated an understanding of the instructions. The patient was advised to call back or seek an in-person evaluation if the symptoms worsen or if the condition fails to improve as anticipated.   I provided 12 minutes of non-face-to-face time during this encounter.  This includes time for charting and coordination of care   Harriette Ohara, MD  I Gardiner Coins am scribing for Dr. Lindi Adie  I have reviewed the above documentation for accuracy and completeness, and I agree with the above.

## 2021-12-30 ENCOUNTER — Inpatient Hospital Stay: Payer: Medicare Other

## 2021-12-31 ENCOUNTER — Inpatient Hospital Stay: Payer: Medicare Other

## 2021-12-31 ENCOUNTER — Other Ambulatory Visit: Payer: Self-pay

## 2021-12-31 DIAGNOSIS — D473 Essential (hemorrhagic) thrombocythemia: Secondary | ICD-10-CM | POA: Diagnosis not present

## 2021-12-31 LAB — CBC WITH DIFFERENTIAL (CANCER CENTER ONLY)
Abs Immature Granulocytes: 0.02 10*3/uL (ref 0.00–0.07)
Basophils Absolute: 0 10*3/uL (ref 0.0–0.1)
Basophils Relative: 0 %
Eosinophils Absolute: 0.2 10*3/uL (ref 0.0–0.5)
Eosinophils Relative: 2 %
HCT: 38.5 % (ref 36.0–46.0)
Hemoglobin: 12.9 g/dL (ref 12.0–15.0)
Immature Granulocytes: 0 %
Lymphocytes Relative: 23 %
Lymphs Abs: 2.3 10*3/uL (ref 0.7–4.0)
MCH: 31.6 pg (ref 26.0–34.0)
MCHC: 33.5 g/dL (ref 30.0–36.0)
MCV: 94.4 fL (ref 80.0–100.0)
Monocytes Absolute: 0.6 10*3/uL (ref 0.1–1.0)
Monocytes Relative: 6 %
Neutro Abs: 6.6 10*3/uL (ref 1.7–7.7)
Neutrophils Relative %: 69 %
Platelet Count: 641 10*3/uL — ABNORMAL HIGH (ref 150–400)
RBC: 4.08 MIL/uL (ref 3.87–5.11)
RDW: 20 % — ABNORMAL HIGH (ref 11.5–15.5)
WBC Count: 9.8 10*3/uL (ref 4.0–10.5)
nRBC: 0 % (ref 0.0–0.2)

## 2021-12-31 LAB — FERRITIN: Ferritin: 19 ng/mL (ref 11–307)

## 2022-01-04 ENCOUNTER — Inpatient Hospital Stay (HOSPITAL_BASED_OUTPATIENT_CLINIC_OR_DEPARTMENT_OTHER): Payer: Medicare Other | Admitting: Hematology and Oncology

## 2022-01-04 DIAGNOSIS — D473 Essential (hemorrhagic) thrombocythemia: Secondary | ICD-10-CM | POA: Diagnosis not present

## 2022-01-04 NOTE — Assessment & Plan Note (Addendum)
Lab: 06/18/21: Hb 11.2, Platelet 778 07/30/2021: Hemoglobin 12.9, platelets 783, iron saturation 13%, ferritin7 10/01/2021: Hemoglobin 14.1, platelets 828, ferritin 22 11/02/2021: Hemoglobin 12.9, platelets 435, ferritin 22, iron saturation 54% 10/04/21: Increased anagrelide to Bid 11/26/21: Started alternating Anagrelide with Hydrea  12/16/2021: Hemoglobin 12.9, platelets 632  (discontinued anagrelide, Hydrea 500 mg twice daily) 12/31/2021: Hemoglobin 12.9, platelets 641  Severe palpitations: Related to anagrelide. D/C anagrelide and her palpitations have mostly resolved. Waiting for heart monitor results.  Plan: Hydroxyurea 1 tablet po bid Adverse Effects: Hair loss, headache For the time being we will keep the same dose. If it further improves, we can reduce the dosage.

## 2022-01-05 ENCOUNTER — Telehealth: Payer: Self-pay | Admitting: Hematology and Oncology

## 2022-01-05 NOTE — Telephone Encounter (Signed)
Scheduled appointment per 9/19 los. Patient is aware.

## 2022-01-06 DIAGNOSIS — M9905 Segmental and somatic dysfunction of pelvic region: Secondary | ICD-10-CM | POA: Diagnosis not present

## 2022-01-06 DIAGNOSIS — M9904 Segmental and somatic dysfunction of sacral region: Secondary | ICD-10-CM | POA: Diagnosis not present

## 2022-01-06 DIAGNOSIS — M5136 Other intervertebral disc degeneration, lumbar region: Secondary | ICD-10-CM | POA: Diagnosis not present

## 2022-01-06 DIAGNOSIS — M9903 Segmental and somatic dysfunction of lumbar region: Secondary | ICD-10-CM | POA: Diagnosis not present

## 2022-01-20 DIAGNOSIS — M9904 Segmental and somatic dysfunction of sacral region: Secondary | ICD-10-CM | POA: Diagnosis not present

## 2022-01-20 DIAGNOSIS — M9905 Segmental and somatic dysfunction of pelvic region: Secondary | ICD-10-CM | POA: Diagnosis not present

## 2022-01-20 DIAGNOSIS — M9903 Segmental and somatic dysfunction of lumbar region: Secondary | ICD-10-CM | POA: Diagnosis not present

## 2022-01-20 DIAGNOSIS — M5136 Other intervertebral disc degeneration, lumbar region: Secondary | ICD-10-CM | POA: Diagnosis not present

## 2022-01-27 ENCOUNTER — Other Ambulatory Visit: Payer: Self-pay | Admitting: *Deleted

## 2022-01-27 MED ORDER — HYDROXYUREA 500 MG PO CAPS: 500.0000 mg | ORAL_CAPSULE | Freq: Two times a day (BID) | ORAL | 0 refills | Status: DC

## 2022-01-27 NOTE — Telephone Encounter (Signed)
Received call from pt requesting a 5 day prescription for hydrea 500 incase MD changes prescription during visit next week. Prescription sent to pharmacy on file.

## 2022-01-28 ENCOUNTER — Other Ambulatory Visit: Payer: Self-pay

## 2022-01-28 ENCOUNTER — Inpatient Hospital Stay: Payer: Medicare Other | Attending: Hematology and Oncology

## 2022-01-28 DIAGNOSIS — D473 Essential (hemorrhagic) thrombocythemia: Secondary | ICD-10-CM | POA: Diagnosis not present

## 2022-01-28 LAB — CBC WITH DIFFERENTIAL (CANCER CENTER ONLY)
Abs Immature Granulocytes: 0.02 10*3/uL (ref 0.00–0.07)
Basophils Absolute: 0 10*3/uL (ref 0.0–0.1)
Basophils Relative: 1 %
Eosinophils Absolute: 0.2 10*3/uL (ref 0.0–0.5)
Eosinophils Relative: 3 %
HCT: 36.9 % (ref 36.0–46.0)
Hemoglobin: 12.7 g/dL (ref 12.0–15.0)
Immature Granulocytes: 0 %
Lymphocytes Relative: 31 %
Lymphs Abs: 2.2 10*3/uL (ref 0.7–4.0)
MCH: 33.9 pg (ref 26.0–34.0)
MCHC: 34.4 g/dL (ref 30.0–36.0)
MCV: 98.4 fL (ref 80.0–100.0)
Monocytes Absolute: 0.4 10*3/uL (ref 0.1–1.0)
Monocytes Relative: 5 %
Neutro Abs: 4.2 10*3/uL (ref 1.7–7.7)
Neutrophils Relative %: 60 %
Platelet Count: 399 10*3/uL (ref 150–400)
RBC: 3.75 MIL/uL — ABNORMAL LOW (ref 3.87–5.11)
RDW: 19.6 % — ABNORMAL HIGH (ref 11.5–15.5)
WBC Count: 7 10*3/uL (ref 4.0–10.5)
nRBC: 0 % (ref 0.0–0.2)

## 2022-01-28 LAB — IRON AND IRON BINDING CAPACITY (CC-WL,HP ONLY)
Iron: 166 ug/dL (ref 28–170)
Saturation Ratios: 74 % — ABNORMAL HIGH (ref 10.4–31.8)
TIBC: 224 ug/dL — ABNORMAL LOW (ref 250–450)
UIBC: 58 ug/dL — ABNORMAL LOW (ref 148–442)

## 2022-01-28 LAB — FERRITIN: Ferritin: 31 ng/mL (ref 11–307)

## 2022-01-31 DIAGNOSIS — M9903 Segmental and somatic dysfunction of lumbar region: Secondary | ICD-10-CM | POA: Diagnosis not present

## 2022-01-31 DIAGNOSIS — M9904 Segmental and somatic dysfunction of sacral region: Secondary | ICD-10-CM | POA: Diagnosis not present

## 2022-01-31 DIAGNOSIS — M9905 Segmental and somatic dysfunction of pelvic region: Secondary | ICD-10-CM | POA: Diagnosis not present

## 2022-01-31 DIAGNOSIS — M5136 Other intervertebral disc degeneration, lumbar region: Secondary | ICD-10-CM | POA: Diagnosis not present

## 2022-01-31 NOTE — Progress Notes (Signed)
HEMATOLOGY-ONCOLOGY TELEPHONE VISIT PROGRESS NOTE  I connected with our patient on 02/01/22 at  9:45 AM EDT by telephone and verified that I am speaking with the correct person using two identifiers.  I discussed the limitations, risks, security and privacy concerns of performing an evaluation and management service by telephone and the availability of in person appointments.  I also discussed with the patient that there may be a patient responsible charge related to this service. The patient expressed understanding and agreed to proceed.   History of Present Illness: Kara [name] is a 71 y.o. female with above-mentioned history of thrombocytosis and hereditary hemochromatosis. She presents via telephone today for follow-up to discuss the results.  REVIEW OF SYSTEMS:   Constitutional: fluid retention All other systems were reviewed with the patient and are negative.  Observations/Objective:  Swelling feet   Assessment Plan:  Essential thrombocytosis ([place]) Lab: 06/18/21:  Hb 11.2, Platelet 778 07/30/2021: Hemoglobin 12.9, platelets 783, iron saturation 13%, ferritin 7  10/01/2021: Hemoglobin 14.1, platelets 828, ferritin 22 11/02/2021: Hemoglobin 12.9, platelets 435, ferritin 22, iron saturation 54%  10/04/21: Increased anagrelide to Bid 11/26/21: Started alternating Anagrelide with Hydrea  12/16/2021: Hemoglobin 12.9, platelets 632  (discontinued anagrelide, Hydrea 500 mg twice daily) 12/31/2021: Hemoglobin 12.9, platelets 641 01/28/2022: Hemoglobin 12.7, platelets 399, ferritin 31   Severe palpitations: Related to anagrelide. D/C anagrelide and her palpitations have mostly resolved. Waiting for heart monitor results.   Plan: Hydroxyurea 1 tablet po daily Adverse Effects: Hair loss, headache, swelling of feet  Based on the excellent response to Hydrea 1 mg p.o. twice daily we will reduce the dosage of Hydrea to 1 tablet a day. She's going to eat a better diet.  Recheck labs and follow up  in 1 month   I discussed the assessment and treatment plan with the patient. The patient was provided an opportunity to ask questions and all were answered. The patient agreed with the plan and demonstrated an understanding of the instructions. The patient was advised to call back or seek an in-person evaluation if the symptoms worsen or if the condition fails to improve as anticipated.   I provided 12 minutes of non-face-to-face time during this encounter.  This includes time for charting and coordination of care   Harriette Ohara, MD  I Gardiner Coins am scribing for Dr. Lindi Adie  I have reviewed the above documentation for accuracy and completeness, and I agree with the above.

## 2022-02-01 ENCOUNTER — Inpatient Hospital Stay (HOSPITAL_BASED_OUTPATIENT_CLINIC_OR_DEPARTMENT_OTHER): Payer: Medicare Other | Admitting: Hematology and Oncology

## 2022-02-01 ENCOUNTER — Other Ambulatory Visit: Payer: Self-pay | Admitting: *Deleted

## 2022-02-01 DIAGNOSIS — D473 Essential (hemorrhagic) thrombocythemia: Secondary | ICD-10-CM | POA: Diagnosis not present

## 2022-02-01 MED ORDER — HYDROXYUREA 500 MG PO CAPS: 500.0000 mg | ORAL_CAPSULE | Freq: Every day | ORAL | 3 refills | Status: DC

## 2022-02-01 NOTE — Assessment & Plan Note (Addendum)
Lab: 06/18/21: Hb 11.2, Platelet 778 07/30/2021: Hemoglobin 12.9, platelets 783, iron saturation 13%, ferritin7 10/01/2021: Hemoglobin 14.1, platelets 828, ferritin 22 11/02/2021: Hemoglobin 12.9, platelets 435, ferritin 22, iron saturation 54% 10/04/21: Increased anagrelide to Bid 11/26/21: Started alternating Anagrelide with Hydrea  12/16/2021: Hemoglobin 12.9, platelets632 (discontinued anagrelide, Hydrea 500 mg twice daily) 12/31/2021: Hemoglobin 12.9, platelets 641 01/28/2022: Hemoglobin 12.7, platelets 399  Severe palpitations:Related toanagrelide. D/C anagrelide and her palpitations have mostly resolved. Waiting for heart monitor results.  Plan:Hydroxyurea 1 tabletpo daily Adverse Effects: Hair loss, headache, swelling of feet  Based on the excellent response to Hydrea 1 mg p.o. twice daily we will reduce the dosage of Hydrea to 1 tablet a day.  Recheck labs and follow up in 1 month

## 2022-02-01 NOTE — Addendum Note (Signed)
Addended by: Nicholas Lose on: 02/01/2022 10:10 AM   Modules accepted: Orders

## 2022-02-03 ENCOUNTER — Other Ambulatory Visit: Payer: Medicare Other

## 2022-02-04 ENCOUNTER — Telehealth: Payer: Self-pay | Admitting: Hematology and Oncology

## 2022-02-04 NOTE — Telephone Encounter (Signed)
Scheduled appointment per 10/17 los. Patient is aware.

## 2022-02-07 ENCOUNTER — Telehealth: Payer: Medicare Other | Admitting: Hematology and Oncology

## 2022-02-10 DIAGNOSIS — J329 Chronic sinusitis, unspecified: Secondary | ICD-10-CM | POA: Diagnosis not present

## 2022-02-10 DIAGNOSIS — J310 Chronic rhinitis: Secondary | ICD-10-CM | POA: Diagnosis not present

## 2022-02-17 ENCOUNTER — Ambulatory Visit: Payer: Medicare Other | Admitting: Family

## 2022-02-17 DIAGNOSIS — M9905 Segmental and somatic dysfunction of pelvic region: Secondary | ICD-10-CM | POA: Diagnosis not present

## 2022-02-17 DIAGNOSIS — M5136 Other intervertebral disc degeneration, lumbar region: Secondary | ICD-10-CM | POA: Diagnosis not present

## 2022-02-17 DIAGNOSIS — M9904 Segmental and somatic dysfunction of sacral region: Secondary | ICD-10-CM | POA: Diagnosis not present

## 2022-02-17 DIAGNOSIS — M9903 Segmental and somatic dysfunction of lumbar region: Secondary | ICD-10-CM | POA: Diagnosis not present

## 2022-02-18 ENCOUNTER — Encounter: Payer: Self-pay | Admitting: Hematology and Oncology

## 2022-02-18 ENCOUNTER — Telehealth (HOSPITAL_COMMUNITY): Payer: Self-pay | Admitting: Emergency Medicine

## 2022-02-18 NOTE — Telephone Encounter (Signed)
Reaching out to patient to offer assistance regarding upcoming cardiac imaging study; pt verbalizes understanding of appt date/time, parking situation and where to check in, pre-test NPO status and medications ordered, and verified current allergies; name and call back number provided for further questions should they arise Alyvia Derk RN Navigator Cardiac Imaging  [facility] [phone] office [phone] cell 

## 2022-02-21 ENCOUNTER — Other Ambulatory Visit (HOSPITAL_COMMUNITY): Payer: Self-pay | Admitting: Internal Medicine

## 2022-02-21 ENCOUNTER — Ambulatory Visit (HOSPITAL_COMMUNITY)
Admission: RE | Admit: 2022-02-21 | Discharge: 2022-02-21 | Disposition: A | Discharge status: Home / Self Care | Payer: Medicare Other | Source: Ambulatory Visit | Attending: Internal Medicine | Admitting: Internal Medicine

## 2022-02-21 ENCOUNTER — Ambulatory Visit (HOSPITAL_COMMUNITY): Payer: Medicare Other

## 2022-02-21 DIAGNOSIS — I471 Supraventricular tachycardia, unspecified: Secondary | ICD-10-CM

## 2022-02-21 MED ORDER — GADOBUTROL 1 MMOL/ML IV SOLN
7.0000 mL | Freq: Once | INTRAVENOUS | Status: AC | PRN
  Administered 2022-02-21: 7 mL via INTRAVENOUS

## 2022-02-22 ENCOUNTER — Ambulatory Visit: Payer: Medicare Other | Admitting: Family

## 2022-02-23 ENCOUNTER — Ambulatory Visit: Payer: Self-pay

## 2022-02-23 ENCOUNTER — Ambulatory Visit (INDEPENDENT_AMBULATORY_CARE_PROVIDER_SITE_OTHER): Payer: Medicare Other | Admitting: Family

## 2022-02-23 DIAGNOSIS — M79671 Pain in right foot: Secondary | ICD-10-CM | POA: Diagnosis not present

## 2022-02-23 NOTE — Progress Notes (Signed)
Office Visit Note   Patient: Kara [name]           Date of Birth: 1951/03/22           MRN: [id number] Visit Date: 02/23/2022              Requested by: No referring provider defined for this encounter. PCP: Pcp, No  Chief Complaint  Patient presents with   Right Foot - Pain      HPI: The patient is a 71 year old woman who presents today complaining of right forefoot pain pain over the dorsum of her foot particularly over the metatarsal heads there is some associated swelling this pain has come on over the last 4 weeks she has been to a chiropractor and had manipulation which did not relieve her pain  She doubts it could be a stress fracture has not been very active.  She notes that she does have some relief of her symptoms when she wears a tightly tied tennis shoe.  She also has relief of her symptoms when she goes up on her toes doing a single limb heel raise on the right.  She has not had any recent injury no insect bite no erythema or warmth  Does occasionally have pain at rest  Assessment & Plan: Visit Diagnoses:  1. Pain in right foot     Plan: Metatarsalgia.  Reassurance provided.  Discussed using anti-inflammatories by mouth supportive shoewear working on heel cord stretching.  Reassurance provided she may proceed with her elective surgery next week.  Follow-Up Instructions: No follow-ups on file.   Ortho Exam  Patient is alert, oriented, no adenopathy, well-dressed, normal affect, normal respiratory effort. On examination of the right foot there is some soft tissue swelling over the metatarsal heads dorsum of the right foot there is no erythema no ecchymosis no warmth this is generalized tenderness.  No plantar pain.  She does have heel cord tightness with dorsiflexion to neutral there is no pain with lateral compression of the metatarsal heads  Imaging: No results found. No images are attached to the encounter.  Labs: Lab Results  Component Value Date   HGBA1C  5.4 04/28/2016   ESRSEDRATE 2 04/28/2016     Lab Results  Component Value Date   ALBUMIN 4.8 12/16/2021   ALBUMIN 4.5 11/02/2021   ALBUMIN 4.7 10/01/2021    Lab Results  Component Value Date   MG 2.3 12/16/2021   No results found for: "VD25OH"  No results found for: "PREALBUMIN"    Latest Ref Rng & Units 01/28/2022    3:08 PM 12/31/2021    3:39 PM 12/16/2021    2:31 PM  CBC EXTENDED  WBC 4.0 - 10.5 K/uL 7.0  9.8  9.1   RBC 3.87 - 5.11 MIL/uL 3.75  4.08  4.21   Hemoglobin 12.0 - 15.0 g/dL 12.7  12.9  12.9   HCT 36.0 - 46.0 % 36.9  38.5  37.8   Platelets 150 - 400 K/uL 399  641  632   NEUT# 1.7 - 7.7 K/uL 4.2  6.6  6.4   Lymph# 0.7 - 4.0 K/uL 2.2  2.3  1.9      There is no height or weight on file to calculate BMI.  Orders:  Orders Placed This Encounter  Procedures   XR Foot Complete Right   No orders of the defined types were placed in this encounter.    Procedures: No procedures performed  Clinical Data: No additional findings.  ROS:  All other systems negative, except as noted in the HPI. Review of Systems  Objective: Vital Signs: There were no vitals taken for this visit.  Specialty Comments:  No specialty comments available.  PMFS History: Patient Active Problem List   Diagnosis Date Noted   Essential thrombocytosis ([place]) 05/22/2018   Chronic migraine 11/21/2017   Hemochromatosis 06/01/2016   Benign paroxysmal positional vertigo 05/20/2016   Orthostatic hypotension 05/20/2016   Nasal obstruction 05/19/2016   Light headedness 04/28/2016   Screening for hyperlipidemia 04/28/2016   Past Medical History:  Diagnosis Date   Hemochromatosis    Migraine     Family History  Problem Relation Age of Onset   Healthy Mother    Acute myelogenous leukemia Father    Bipolar disorder Sister    Diabetes Neg Hx    Cancer Neg Hx    Heart failure Neg Hx    Hyperlipidemia Neg Hx    Hypertension Neg Hx     Past Surgical History:  Procedure  Laterality Date   NASAL SEPTUM SURGERY     Social History   Occupational History   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

## 2022-02-26 NOTE — Progress Notes (Signed)
HEMATOLOGY-ONCOLOGY TELEPHONE VISIT PROGRESS NOTE  I connected with our patient on 02/28/22 at  1:30 PM EST by telephone and verified that I am speaking with the correct person using two identifiers.  I discussed the limitations, risks, security and privacy concerns of performing an evaluation and management service by telephone and the availability of in person appointments.  I also discussed with the patient that there may be a patient responsible charge related to this service. The patient expressed understanding and agreed to proceed.   History of Present Illness:  Kara [name] is a 71 y.o. female with above-mentioned history of thrombocytosis and hereditary hemochromatosis. She presents via telephone today for follow-up to discuss the plan.  She is going to undergo facelift surgery and Dr. Sabra Heck will be performing this and she was interested to know if she can hold the [facility] for a few days prior to that.  She has been tolerating the 1 tablet today Hydrea fairly well her major complaint is loss of hair.     REVIEW OF SYSTEMS:   Constitutional: Denies fevers, chills or abnormal weight loss All other systems were reviewed with the patient and are negative. Observations/Objective:     Assessment Plan:  Essential thrombocytosis ([place]) Lab: 06/18/21:  Hb 11.2, Platelet 778 07/30/2021: Hemoglobin 12.9, platelets 783, iron saturation 13%, ferritin 7  10/01/2021: Hemoglobin 14.1, platelets 828, ferritin 22 11/02/2021: Hemoglobin 12.9, platelets 435, ferritin 22, iron saturation 54%  10/04/21: Increased anagrelide to Bid 11/26/21: Started alternating Anagrelide with Hydrea  12/16/2021: Hemoglobin 12.9, platelets 632  (discontinued anagrelide, Hydrea 500 mg twice daily) 12/31/2021: Hemoglobin 12.9, platelets 641 01/28/2022: Hemoglobin 12.7, platelets 399, ferritin 31   Severe palpitations: Related to anagrelide. D/C anagrelide and her palpitations have mostly resolved. Waiting for heart monitor  results.   Plan: Hydroxyurea 500 mg po daily Adverse Effects: Hair loss  Patient is going to undergo facelift. Dr. Sabra Heck will be performing the surgery.  It is absolutely fine to hold the Hydrea for a week before surgery and resume it 2 days later.  We will recheck labs this week and week after the surgery     I discussed the assessment and treatment plan with the patient. The patient was provided an opportunity to ask questions and all were answered. The patient agreed with the plan and demonstrated an understanding of the instructions. The patient was advised to call back or seek an in-person evaluation if the symptoms worsen or if the condition fails to improve as anticipated.   I provided 12 minutes of non-face-to-face time during this encounter.  This includes time for charting and coordination of care   Harriette Ohara, MD  I Gardiner Coins am scribing for Dr. Lindi Adie  I have reviewed the above documentation for accuracy and completeness, and I agree with the above.

## 2022-02-28 ENCOUNTER — Inpatient Hospital Stay: Payer: Medicare Other | Attending: Hematology and Oncology | Admitting: Hematology and Oncology

## 2022-02-28 DIAGNOSIS — R002 Palpitations: Secondary | ICD-10-CM | POA: Diagnosis not present

## 2022-02-28 DIAGNOSIS — D473 Essential (hemorrhagic) thrombocythemia: Secondary | ICD-10-CM | POA: Diagnosis not present

## 2022-02-28 NOTE — Assessment & Plan Note (Signed)
Lab: 06/18/21:  Hb 11.2, Platelet 778 07/30/2021: Hemoglobin 12.9, platelets 783, iron saturation 13%, ferritin 7  10/01/2021: Hemoglobin 14.1, platelets 828, ferritin 22 11/02/2021: Hemoglobin 12.9, platelets 435, ferritin 22, iron saturation 54%  10/04/21: Increased anagrelide to Bid 11/26/21: Started alternating Anagrelide with Hydrea  12/16/2021: Hemoglobin 12.9, platelets 632  (discontinued anagrelide, Hydrea 500 mg twice daily) 12/31/2021: Hemoglobin 12.9, platelets 641 01/28/2022: Hemoglobin 12.7, platelets 399, ferritin 31   Severe palpitations: Related to anagrelide. D/C anagrelide and her palpitations have mostly resolved. Waiting for heart monitor results.   Plan: Hydroxyurea 1 tablet po daily Adverse Effects: Hair loss, headache, swelling of feet   Based on the excellent response to Hydrea 1 mg p.o. twice daily we will reduce the dosage of Hydrea to 1 tablet a day. She's going to eat a better diet.   Recheck labs and follow up in 2 months

## 2022-03-03 ENCOUNTER — Inpatient Hospital Stay: Payer: Medicare Other

## 2022-03-03 ENCOUNTER — Other Ambulatory Visit: Payer: Self-pay

## 2022-03-03 DIAGNOSIS — M9905 Segmental and somatic dysfunction of pelvic region: Secondary | ICD-10-CM | POA: Diagnosis not present

## 2022-03-03 DIAGNOSIS — D473 Essential (hemorrhagic) thrombocythemia: Secondary | ICD-10-CM

## 2022-03-03 DIAGNOSIS — M9904 Segmental and somatic dysfunction of sacral region: Secondary | ICD-10-CM | POA: Diagnosis not present

## 2022-03-03 DIAGNOSIS — M9903 Segmental and somatic dysfunction of lumbar region: Secondary | ICD-10-CM | POA: Diagnosis not present

## 2022-03-03 DIAGNOSIS — M5136 Other intervertebral disc degeneration, lumbar region: Secondary | ICD-10-CM | POA: Diagnosis not present

## 2022-03-03 LAB — CBC WITH DIFFERENTIAL (CANCER CENTER ONLY)
Abs Immature Granulocytes: 0.02 10*3/uL (ref 0.00–0.07)
Basophils Absolute: 0 10*3/uL (ref 0.0–0.1)
Basophils Relative: 0 %
Eosinophils Absolute: 0.1 10*3/uL (ref 0.0–0.5)
Eosinophils Relative: 2 %
HCT: 40.3 % (ref 36.0–46.0)
Hemoglobin: 13.6 g/dL (ref 12.0–15.0)
Immature Granulocytes: 0 %
Lymphocytes Relative: 25 %
Lymphs Abs: 1.3 10*3/uL (ref 0.7–4.0)
MCH: 34.3 pg — ABNORMAL HIGH (ref 26.0–34.0)
MCHC: 33.7 g/dL (ref 30.0–36.0)
MCV: 101.8 fL — ABNORMAL HIGH (ref 80.0–100.0)
Monocytes Absolute: 0.4 10*3/uL (ref 0.1–1.0)
Monocytes Relative: 7 %
Neutro Abs: 3.4 10*3/uL (ref 1.7–7.7)
Neutrophils Relative %: 66 %
Platelet Count: 388 10*3/uL (ref 150–400)
RBC: 3.96 MIL/uL (ref 3.87–5.11)
RDW: 17.8 % — ABNORMAL HIGH (ref 11.5–15.5)
WBC Count: 5.2 10*3/uL (ref 4.0–10.5)
nRBC: 0 % (ref 0.0–0.2)

## 2022-03-03 LAB — IRON AND IRON BINDING CAPACITY (CC-WL,HP ONLY)
Iron: 168 ug/dL (ref 28–170)
Saturation Ratios: 73 % — ABNORMAL HIGH (ref 10.4–31.8)
TIBC: 230 ug/dL — ABNORMAL LOW (ref 250–450)
UIBC: 62 ug/dL — ABNORMAL LOW (ref 148–442)

## 2022-03-03 LAB — FERRITIN: Ferritin: 48 ng/mL (ref 11–307)

## 2022-03-04 ENCOUNTER — Inpatient Hospital Stay: Payer: Medicare Other

## 2022-03-06 NOTE — Progress Notes (Signed)
I left a voicemail for the patient that the blood work appears to be holding steady and that she can proceed with her plastic surgery as planned. This encounter was created in error - please disregard.

## 2022-03-07 ENCOUNTER — Other Ambulatory Visit (HOSPITAL_COMMUNITY): Payer: Medicare Other

## 2022-03-07 ENCOUNTER — Inpatient Hospital Stay: Payer: Medicare Other | Admitting: Hematology and Oncology

## 2022-03-07 DIAGNOSIS — D473 Essential (hemorrhagic) thrombocythemia: Secondary | ICD-10-CM

## 2022-03-07 NOTE — Assessment & Plan Note (Signed)
Lab: 06/18/21:  Hb 11.2, Platelet 778 07/30/2021: Hemoglobin 12.9, platelets 783, iron saturation 13%, ferritin 7  10/01/2021: Hemoglobin 14.1, platelets 828, ferritin 22 11/02/2021: Hemoglobin 12.9, platelets 435, ferritin 22, iron saturation 54%  10/04/21: Increased anagrelide to Bid 11/26/21: Started alternating Anagrelide with Hydrea  12/16/2021: Hemoglobin 12.9, platelets 632  (discontinued anagrelide, Hydrea 500 mg twice daily) 12/31/2021: Hemoglobin 12.9, platelets 641 01/28/2022: Hemoglobin 12.7, platelets 399, ferritin 31 03/03/2022: Hemoglobin 13.6, platelets 388, iron saturation 73%, ferritin 48   Severe palpitations: Related to anagrelide. D/C anagrelide and her palpitations have mostly resolved.   Plan: Hydroxyurea 500 mg po daily Adverse Effects: Hair loss  Holding hydroxyurea for plastic surgery 1 week before and after 2 days later.

## 2022-03-08 ENCOUNTER — Telehealth (HOSPITAL_COMMUNITY): Payer: Self-pay | Admitting: Surgery

## 2022-03-08 NOTE — Telephone Encounter (Signed)
I called patient to remind her to perform ordered home sleep study.  She tells me that she just had surgery and requests a call back at a later time.

## 2022-03-15 ENCOUNTER — Inpatient Hospital Stay: Payer: Medicare Other

## 2022-03-15 ENCOUNTER — Other Ambulatory Visit: Payer: Self-pay | Admitting: *Deleted

## 2022-03-15 ENCOUNTER — Other Ambulatory Visit: Payer: Self-pay

## 2022-03-15 DIAGNOSIS — D473 Essential (hemorrhagic) thrombocythemia: Secondary | ICD-10-CM

## 2022-03-15 LAB — CBC WITH DIFFERENTIAL (CANCER CENTER ONLY)
Abs Immature Granulocytes: 0.04 10*3/uL (ref 0.00–0.07)
Basophils Absolute: 0 10*3/uL (ref 0.0–0.1)
Basophils Relative: 0 %
Eosinophils Absolute: 0.2 10*3/uL (ref 0.0–0.5)
Eosinophils Relative: 3 %
HCT: 36.7 % (ref 36.0–46.0)
Hemoglobin: 12.6 g/dL (ref 12.0–15.0)
Immature Granulocytes: 1 %
Lymphocytes Relative: 19 %
Lymphs Abs: 1.5 10*3/uL (ref 0.7–4.0)
MCH: 34.8 pg — ABNORMAL HIGH (ref 26.0–34.0)
MCHC: 34.3 g/dL (ref 30.0–36.0)
MCV: 101.4 fL — ABNORMAL HIGH (ref 80.0–100.0)
Monocytes Absolute: 0.5 10*3/uL (ref 0.1–1.0)
Monocytes Relative: 6 %
Neutro Abs: 5.8 10*3/uL (ref 1.7–7.7)
Neutrophils Relative %: 71 %
Platelet Count: 707 10*3/uL — ABNORMAL HIGH (ref 150–400)
RBC: 3.62 MIL/uL — ABNORMAL LOW (ref 3.87–5.11)
RDW: 16.8 % — ABNORMAL HIGH (ref 11.5–15.5)
WBC Count: 8.1 10*3/uL (ref 4.0–10.5)
nRBC: 0 % (ref 0.0–0.2)

## 2022-03-15 LAB — IRON AND IRON BINDING CAPACITY (CC-WL,HP ONLY)
Iron: 158 ug/dL (ref 28–170)
Saturation Ratios: 61 % — ABNORMAL HIGH (ref 10.4–31.8)
TIBC: 260 ug/dL (ref 250–450)
UIBC: 102 ug/dL — ABNORMAL LOW (ref 148–442)

## 2022-03-15 LAB — FERRITIN: Ferritin: 45 ng/mL (ref 11–307)

## 2022-03-15 NOTE — Progress Notes (Signed)
HEMATOLOGY-ONCOLOGY TELEPHONE VISIT PROGRESS NOTE  I connected with our patient on 03/17/22 at  2:15 PM EST by telephone and verified that I am speaking with the correct person using two identifiers.  I discussed the limitations, risks, security and privacy concerns of performing an evaluation and management service by telephone and the availability of in person appointments.  I also discussed with the patient that there may be a patient responsible charge related to this service. The patient expressed understanding and agreed to proceed.   History of Present Illness: Kara [name] is a 71 y.o. female with above-mentioned history of thrombocytosis and hereditary hemochromatosis. She presents via telephone today for follow-up    REVIEW OF SYSTEMS:   Constitutional: Denies fevers, chills or abnormal weight loss All other systems were reviewed with the patient and are negative. Observations/Objective:     Assessment Plan:  Essential thrombocytosis ([place]) Lab: 06/18/21:  Hb 11.2, Platelet 778 07/30/2021: Hemoglobin 12.9, platelets 783, iron saturation 13%, ferritin 7  10/01/2021: Hemoglobin 14.1, platelets 828, ferritin 22 11/02/2021: Hemoglobin 12.9, platelets 435, ferritin 22, iron saturation 54%  10/04/21: Increased anagrelide to Bid 11/26/21: Started alternating Anagrelide with Hydrea  12/16/2021: Hemoglobin 12.9, platelets 632  (discontinued anagrelide, Hydrea 500 mg twice daily) 12/31/2021: Hemoglobin 12.9, platelets 641 01/28/2022: Hemoglobin 12.7, platelets 399, ferritin 31 03/15/2022: Hemoglobin 12.6, platelets 707, ferritin 45, iron saturation 61%   Severe palpitations: Related to anagrelide. D/C anagrelide and her palpitations have mostly resolved. Waiting for heart monitor results.   Plan:  Hydroxyurea 500 mg   The sudden rise in the platelet count is because she was off hydroxyurea.   She would like to recheck the platelet count in a week next Tuesday and then decide.  She wants to  know if there are any alternatives to anagrelide and Hydrea.   Adverse Effects: Hair loss: The hair loss is so profound that she is not willing to continue Hydrea.  I recommended a second opinion consultation at [facility].  I request Dr. Elmo Putt to see her for consultation and help design her treatment plan.   Patient did facelift. Dr. Sabra Heck performed the surgery.  Telephone visit next week to discuss results    I discussed the assessment and treatment plan with the patient. The patient was provided an opportunity to ask questions and all were answered. The patient agreed with the plan and demonstrated an understanding of the instructions. The patient was advised to call back or seek an in-person evaluation if the symptoms worsen or if the condition fails to improve as anticipated.   I provided 12 minutes of non-face-to-face time during this encounter.  This includes time for charting and coordination of care   Harriette Ohara, MD  I Gardiner Coins am scribing for Dr. Lindi Adie  I have reviewed the above documentation for accuracy and completeness, and I agree with the above.

## 2022-03-17 ENCOUNTER — Other Ambulatory Visit: Payer: Self-pay | Admitting: *Deleted

## 2022-03-17 ENCOUNTER — Inpatient Hospital Stay (HOSPITAL_BASED_OUTPATIENT_CLINIC_OR_DEPARTMENT_OTHER): Payer: Medicare Other | Admitting: Hematology and Oncology

## 2022-03-17 DIAGNOSIS — D473 Essential (hemorrhagic) thrombocythemia: Secondary | ICD-10-CM | POA: Diagnosis not present

## 2022-03-17 NOTE — Assessment & Plan Note (Addendum)
Lab: 06/18/21:  Hb 11.2, Platelet 778 07/30/2021: Hemoglobin 12.9, platelets 783, iron saturation 13%, ferritin 7  10/01/2021: Hemoglobin 14.1, platelets 828, ferritin 22 11/02/2021: Hemoglobin 12.9, platelets 435, ferritin 22, iron saturation 54%  10/04/21: Increased anagrelide to Bid 11/26/21: Started alternating Anagrelide with Hydrea  12/16/2021: Hemoglobin 12.9, platelets 632  (discontinued anagrelide, Hydrea 500 mg twice daily) 12/31/2021: Hemoglobin 12.9, platelets 641 01/28/2022: Hemoglobin 12.7, platelets 399, ferritin 31 03/15/2022: Hemoglobin 12.6, platelets 707, ferritin 45, iron saturation 61%   Severe palpitations: Related to anagrelide. D/C anagrelide and her palpitations have mostly resolved. Waiting for heart monitor results.   Plan:  Hydroxyurea 500 mg   The sudden rise in the platelet count is because she was off hydroxyurea.   She would like to recheck the platelet count in a week next Tuesday and then decide.  She wants to know if there are any alternatives to anagrelide and Hydrea.   Adverse Effects: Hair loss: The hair loss is so profound that she is not willing to continue Hydrea.  I recommended a second opinion consultation at [facility].  I request Dr. Elmo Putt to see her for consultation and help design her treatment plan.   Patient did facelift. Dr. Sabra Heck performed the surgery.  Telephone visit next week to discuss results

## 2022-03-17 NOTE — Progress Notes (Signed)
Per MD request referral to Dr. Lysle Dingwall with [facility] for evaluation and treatment of essential thrombocytosis intolerant to medications successfully faxed [phone]).

## 2022-03-18 ENCOUNTER — Telehealth: Payer: Self-pay | Admitting: Hematology and Oncology

## 2022-03-18 NOTE — Telephone Encounter (Signed)
Scheduled appointment per 11/30 los. Patient is aware.

## 2022-03-19 NOTE — Progress Notes (Signed)
HEMATOLOGY-ONCOLOGY TELEPHONE VISIT PROGRESS NOTE  I connected with our patient on 03/23/22 at  8:00 AM EST by telephone and verified that I am speaking with the correct person using two identifiers.  I discussed the limitations, risks, security and privacy concerns of performing an evaluation and management service by telephone and the availability of in person appointments.  I also discussed with the patient that there may be a patient responsible charge related to this service. The patient expressed understanding and agreed to proceed.   History of Present Illness: Kara [name] is a 71 y.o. female with above-mentioned history of thrombocytosis and hereditary hemochromatosis. She presents via telephone today for follow-up . Still recovering from her facial plastic surgery. Throat starting to feel better (from surgery)  REVIEW OF SYSTEMS:   Constitutional: Denies fevers, chills or abnormal weight loss All other systems were reviewed with the patient and are negative. Observations/Objective:     Assessment Plan:  Essential thrombocytosis ([place]) Lab: 06/18/21:  Hb 11.2, Platelet 778 07/30/2021: Hemoglobin 12.9, platelets 783, iron saturation 13%, ferritin 7  10/01/2021: Hemoglobin 14.1, platelets 828, ferritin 22 11/02/2021: Hemoglobin 12.9, platelets 435, ferritin 22, iron saturation 54%  10/04/21: Increased anagrelide to Bid 11/26/21: Started alternating Anagrelide with Hydrea  12/16/2021: Hemoglobin 12.9, platelets 632  (discontinued anagrelide, Hydrea 500 mg twice daily) 12/31/2021: Hemoglobin 12.9, platelets 641 01/28/2022: Hemoglobin 12.7, platelets 399, ferritin 31 03/15/2022: Hemoglobin 12.6, platelets 707, ferritin 45, iron saturation 61% 03/22/22: Platelets 552   Severe palpitations: Related to anagrelide. D/C anagrelide and her palpitations have mostly resolved. Waiting for heart monitor results.   Plan:  Hydroxyurea 500 mg   The sudden rise in the platelet count is because she was  off hydroxyurea.   She would like to recheck the platelet count in a week next Tuesday and then decide.  She wants to know if there are any alternatives to anagrelide and Hydrea.     Adverse Effects: Hair loss: The hair loss is so profound that she is not willing to continue Hydrea. Patient did facelift. Dr. Sabra Heck performed the surgery.  Starting a new diet FAST 800 diet   I discussed the assessment and treatment plan with the patient. The patient was provided an opportunity to ask questions and all were answered. The patient agreed with the plan and demonstrated an understanding of the instructions. The patient was advised to call back or seek an in-person evaluation if the symptoms worsen or if the condition fails to improve as anticipated.   I provided 12 minutes of non-face-to-face time during this encounter.  This includes time for charting and coordination of care   Harriette Ohara, MD  I Gardiner Coins am scribing for Dr. Lindi Adie  I have reviewed the above documentation for accuracy and completeness, and I agree with the above.

## 2022-03-22 ENCOUNTER — Other Ambulatory Visit: Payer: Self-pay | Admitting: *Deleted

## 2022-03-22 ENCOUNTER — Inpatient Hospital Stay: Payer: Medicare Other | Attending: Hematology and Oncology

## 2022-03-22 DIAGNOSIS — D473 Essential (hemorrhagic) thrombocythemia: Secondary | ICD-10-CM | POA: Diagnosis not present

## 2022-03-22 LAB — CBC WITH DIFFERENTIAL (CANCER CENTER ONLY)
Abs Immature Granulocytes: 0.02 10*3/uL (ref 0.00–0.07)
Basophils Absolute: 0 10*3/uL (ref 0.0–0.1)
Basophils Relative: 0 %
Eosinophils Absolute: 0.2 10*3/uL (ref 0.0–0.5)
Eosinophils Relative: 2 %
HCT: 37.4 % (ref 36.0–46.0)
Hemoglobin: 12.5 g/dL (ref 12.0–15.0)
Immature Granulocytes: 0 %
Lymphocytes Relative: 19 %
Lymphs Abs: 1.7 10*3/uL (ref 0.7–4.0)
MCH: 34.5 pg — ABNORMAL HIGH (ref 26.0–34.0)
MCHC: 33.4 g/dL (ref 30.0–36.0)
MCV: 103.3 fL — ABNORMAL HIGH (ref 80.0–100.0)
Monocytes Absolute: 0.6 10*3/uL (ref 0.1–1.0)
Monocytes Relative: 7 %
Neutro Abs: 6.2 10*3/uL (ref 1.7–7.7)
Neutrophils Relative %: 72 %
Platelet Count: 552 10*3/uL — ABNORMAL HIGH (ref 150–400)
RBC: 3.62 MIL/uL — ABNORMAL LOW (ref 3.87–5.11)
RDW: 15.8 % — ABNORMAL HIGH (ref 11.5–15.5)
WBC Count: 8.7 10*3/uL (ref 4.0–10.5)
nRBC: 0 % (ref 0.0–0.2)

## 2022-03-23 ENCOUNTER — Inpatient Hospital Stay (HOSPITAL_BASED_OUTPATIENT_CLINIC_OR_DEPARTMENT_OTHER): Payer: Medicare Other | Admitting: Hematology and Oncology

## 2022-03-23 DIAGNOSIS — R002 Palpitations: Secondary | ICD-10-CM | POA: Diagnosis not present

## 2022-03-23 DIAGNOSIS — D473 Essential (hemorrhagic) thrombocythemia: Secondary | ICD-10-CM | POA: Diagnosis not present

## 2022-03-23 NOTE — Assessment & Plan Note (Addendum)
Lab: 06/18/21:  Hb 11.2, Platelet 778 07/30/2021: Hemoglobin 12.9, platelets 783, iron saturation 13%, ferritin 7  10/01/2021: Hemoglobin 14.1, platelets 828, ferritin 22 11/02/2021: Hemoglobin 12.9, platelets 435, ferritin 22, iron saturation 54%  10/04/21: Increased anagrelide to Bid 11/26/21: Started alternating Anagrelide with Hydrea  12/16/2021: Hemoglobin 12.9, platelets 632  (discontinued anagrelide, Hydrea 500 mg twice daily) 12/31/2021: Hemoglobin 12.9, platelets 641 01/28/2022: Hemoglobin 12.7, platelets 399, ferritin 31 03/15/2022: Hemoglobin 12.6, platelets 707, ferritin 45, iron saturation 61% 03/22/22: Platelets 552   Severe palpitations: Related to anagrelide. D/C anagrelide and her palpitations have mostly resolved. Waiting for heart monitor results.   Plan:  Hydroxyurea 500 mg   The sudden rise in the platelet count is because she was off hydroxyurea.   She would like to recheck the platelet count in a week next Tuesday and then decide.  She wants to know if there are any alternatives to anagrelide and Hydrea.     Adverse Effects: Hair loss: The hair loss is so profound that she is not willing to continue Hydrea. Patient did facelift. Dr. Sabra Heck performed the surgery.

## 2022-03-25 ENCOUNTER — Telehealth: Payer: Self-pay | Admitting: Hematology and Oncology

## 2022-03-25 NOTE — Telephone Encounter (Signed)
Per 12/6 los called and spoke to pt about appointments

## 2022-04-04 DIAGNOSIS — D7589 Other specified diseases of blood and blood-forming organs: Secondary | ICD-10-CM | POA: Diagnosis not present

## 2022-04-04 DIAGNOSIS — D751 Secondary polycythemia: Secondary | ICD-10-CM | POA: Diagnosis not present

## 2022-04-04 DIAGNOSIS — D473 Essential (hemorrhagic) thrombocythemia: Secondary | ICD-10-CM | POA: Diagnosis not present

## 2022-04-04 DIAGNOSIS — D75839 Thrombocytosis, unspecified: Secondary | ICD-10-CM | POA: Diagnosis not present

## 2022-04-04 DIAGNOSIS — C946 Myelodysplastic disease, not classified: Secondary | ICD-10-CM | POA: Diagnosis not present

## 2022-04-05 ENCOUNTER — Inpatient Hospital Stay: Payer: Medicare Other

## 2022-04-05 NOTE — Progress Notes (Signed)
HEMATOLOGY-ONCOLOGY TELEPHONE VISIT PROGRESS NOTE  I connected with our patient on 04/06/22 at  3:45 PM EST by telephone and verified that I am speaking with the correct person using two identifiers.  I discussed the limitations, risks, security and privacy concerns of performing an evaluation and management service by telephone and the availability of in person appointments.  I also discussed with the patient that there may be a patient responsible charge related to this service. The patient expressed understanding and agreed to proceed.   History of Present Illness: Kara [name] is a 71 y.o. female with above-mentioned history of thrombocytosis and hereditary hemochromatosis. She presents via telephone today for follow-up . She's been off Hydrea for hair loss. She wants to see if her platelets can come down with diet  REVIEW OF SYSTEMS:   Constitutional: Denies fevers, chills or abnormal weight loss All other systems were reviewed with the patient and are negative.  Observations/Objective:    Assessment Plan:  Essential thrombocytosis ([place]) Lab: 06/18/21:  Hb 11.2, Platelet 778 07/30/2021: Hemoglobin 12.9, platelets 783, iron saturation 13%, ferritin 7  10/01/2021: Hemoglobin 14.1, platelets 828, ferritin 22 11/02/2021: Hemoglobin 12.9, platelets 435, ferritin 22, iron saturation 54%  10/04/21: Increased anagrelide to Bid 11/26/21: Started alternating Anagrelide with Hydrea  12/16/2021: Hemoglobin 12.9, platelets 632  (discontinued anagrelide, Hydrea 500 mg twice daily) 12/31/2021: Hemoglobin 12.9, platelets 641 01/28/2022: Hemoglobin 12.7, platelets 399, ferritin 31 03/15/2022: Hemoglobin 12.6, platelets 707, ferritin 45, iron saturation 61% 03/22/22: Platelets 552 04/06/2022: Platelets 763   Severe palpitations: Related to anagrelide. D/C anagrelide and her palpitations have mostly resolved. Waiting for heart monitor results.   Plan:  Hydroxyurea 500 mg   The sudden rise in the platelet  count is because she was off hydroxyurea.   She would like to recheck the platelet count in a week next Tuesday and then decide.  She wants to know if there are any alternatives to anagrelide and Hydrea.     Adverse Effects: Hair loss: The hair loss is so profound that she is not willing to continue Hydrea.  Patient did facelift. Dr. Sabra Heck performed the surgery.  Starting a new diet FAST 800 diet Recheck labs in 2 weeks to see if the platelets come down  I discussed the assessment and treatment plan with the patient. The patient was provided an opportunity to ask questions and all were answered. The patient agreed with the plan and demonstrated an understanding of the instructions. The patient was advised to call back or seek an in-person evaluation if the symptoms worsen or if the condition fails to improve as anticipated.   I provided 12 minutes of non-face-to-face time during this encounter.  This includes time for charting and coordination of care   Harriette Ohara, MD

## 2022-04-06 ENCOUNTER — Inpatient Hospital Stay: Payer: Medicare Other

## 2022-04-06 ENCOUNTER — Inpatient Hospital Stay (HOSPITAL_BASED_OUTPATIENT_CLINIC_OR_DEPARTMENT_OTHER): Payer: Medicare Other | Admitting: Hematology and Oncology

## 2022-04-06 DIAGNOSIS — D473 Essential (hemorrhagic) thrombocythemia: Secondary | ICD-10-CM

## 2022-04-06 LAB — CBC WITH DIFFERENTIAL (CANCER CENTER ONLY)
Abs Immature Granulocytes: 0.03 10*3/uL (ref 0.00–0.07)
Basophils Absolute: 0.1 10*3/uL (ref 0.0–0.1)
Basophils Relative: 1 %
Eosinophils Absolute: 0.5 10*3/uL (ref 0.0–0.5)
Eosinophils Relative: 6 %
HCT: 39 % (ref 36.0–46.0)
Hemoglobin: 13.2 g/dL (ref 12.0–15.0)
Immature Granulocytes: 0 %
Lymphocytes Relative: 21 %
Lymphs Abs: 1.8 10*3/uL (ref 0.7–4.0)
MCH: 34.6 pg — ABNORMAL HIGH (ref 26.0–34.0)
MCHC: 33.8 g/dL (ref 30.0–36.0)
MCV: 102.4 fL — ABNORMAL HIGH (ref 80.0–100.0)
Monocytes Absolute: 0.7 10*3/uL (ref 0.1–1.0)
Monocytes Relative: 8 %
Neutro Abs: 5.4 10*3/uL (ref 1.7–7.7)
Neutrophils Relative %: 64 %
Platelet Count: 763 10*3/uL — ABNORMAL HIGH (ref 150–400)
RBC: 3.81 MIL/uL — ABNORMAL LOW (ref 3.87–5.11)
RDW: 14.4 % (ref 11.5–15.5)
WBC Count: 8.5 10*3/uL (ref 4.0–10.5)
nRBC: 0 % (ref 0.0–0.2)

## 2022-04-06 NOTE — Assessment & Plan Note (Signed)
Lab: 06/18/21:  Hb 11.2, Platelet 778 07/30/2021: Hemoglobin 12.9, platelets 783, iron saturation 13%, ferritin 7  10/01/2021: Hemoglobin 14.1, platelets 828, ferritin 22 11/02/2021: Hemoglobin 12.9, platelets 435, ferritin 22, iron saturation 54%  10/04/21: Increased anagrelide to Bid 11/26/21: Started alternating Anagrelide with Hydrea  12/16/2021: Hemoglobin 12.9, platelets 632  (discontinued anagrelide, Hydrea 500 mg twice daily) 12/31/2021: Hemoglobin 12.9, platelets 641 01/28/2022: Hemoglobin 12.7, platelets 399, ferritin 31 03/15/2022: Hemoglobin 12.6, platelets 707, ferritin 45, iron saturation 61% 03/22/22: Platelets 552 04/06/2022: Platelets 763   Severe palpitations: Related to anagrelide. D/C anagrelide and her palpitations have mostly resolved. Waiting for heart monitor results.   Plan:  Hydroxyurea 500 mg   The sudden rise in the platelet count is because she was off hydroxyurea.   She would like to recheck the platelet count in a week next Tuesday and then decide.  She wants to know if there are any alternatives to anagrelide and Hydrea.     Adverse Effects: Hair loss: The hair loss is so profound that she is not willing to continue Hydrea.  Patient did facelift. Dr. Sabra Heck performed the surgery.  Starting a new diet FAST 800 diet

## 2022-04-08 ENCOUNTER — Encounter (HOSPITAL_COMMUNITY): Payer: Self-pay | Admitting: Internal Medicine

## 2022-04-08 ENCOUNTER — Ambulatory Visit (HOSPITAL_COMMUNITY)
Admission: RE | Admit: 2022-04-08 | Discharge: 2022-04-08 | Disposition: A | Discharge status: Home / Self Care | Payer: Medicare Other | Source: Ambulatory Visit | Attending: Internal Medicine | Admitting: Internal Medicine

## 2022-04-08 VITALS — BP 110/70 | HR 80 | Wt 123.4 lb

## 2022-04-08 DIAGNOSIS — R002 Palpitations: Secondary | ICD-10-CM | POA: Insufficient documentation

## 2022-04-08 DIAGNOSIS — R0683 Snoring: Secondary | ICD-10-CM | POA: Diagnosis not present

## 2022-04-08 DIAGNOSIS — Z79899 Other long term (current) drug therapy: Secondary | ICD-10-CM | POA: Diagnosis not present

## 2022-04-08 DIAGNOSIS — I471 Supraventricular tachycardia, unspecified: Secondary | ICD-10-CM | POA: Insufficient documentation

## 2022-04-08 DIAGNOSIS — I493 Ventricular premature depolarization: Secondary | ICD-10-CM | POA: Diagnosis not present

## 2022-04-08 DIAGNOSIS — F17210 Nicotine dependence, cigarettes, uncomplicated: Secondary | ICD-10-CM | POA: Diagnosis not present

## 2022-04-08 DIAGNOSIS — D473 Essential (hemorrhagic) thrombocythemia: Secondary | ICD-10-CM | POA: Diagnosis not present

## 2022-04-08 NOTE — Patient Instructions (Signed)
Follow up AS NEEDED

## 2022-04-08 NOTE — Progress Notes (Signed)
[facility]ED [facility] CLINIC NOTE  Referring Physician:Dr. Lindi Adie Primary Care: Pcp, No Primary Cardiologist:New  HPI: [name] is a 71 y/o woman with essential thrombocytosis and hereditary hemochromatosis referred by Dr. Lindi Adie for severe palpitations that started after initiation of angralide   Denies h/o known heart disease. Was started on anagrelide recently and developed palpitations:   Says she has a history of deviated septum and snores a lot. Has never had a sleep study.   Echo 09/06/20 EF 60-65% RVSP 73mHg   Zio 8/23 - Sinus rhythm. 1 six-beat run NSVT, 180 runs of SVT longest 11.5 seconds. Rare PACs and PVCs  cMRI 02/21/22 EF 61% no LGE. No evidence of hemochromatosis.   Here for f/u. Says palpitations much improved after stopping angralide. Now wants to stop hydroxyurea. Gets palpitations 1-2x/week. Particularly when sitting. Very brief episodes. No syncope or presyncope. Has not completed sleep study yet. Drinks 4 cups of coffee per day.    Past Medical History:  Diagnosis Date   Hemochromatosis    Migraine     Current Outpatient Medications  Medication Sig Dispense Refill   aspirin EC 81 MG tablet Take 1 tablet (81 mg total) by mouth daily.     ibuprofen (ADVIL) 800 MG tablet Take 800 mg by mouth as needed.     No current facility-administered medications for this encounter.    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   Caffeine use: 4 cups per day.   Social Determinants of Health   Financial Resource Strain: Not on file  Food Insecurity: Not on file  Transportation Needs: Not on file  Physical Activity: Not on file  Stress: Not on file  Social Connections: Not on file  Intimate Partner Violence: Not on file      Family History  Problem Relation Age of Onset   Healthy Mother    Acute myelogenous leukemia Father    Bipolar disorder Sister    Diabetes Neg Hx    Cancer Neg Hx    Heart failure Neg Hx    Hyperlipidemia Neg Hx    Hypertension Neg Hx     Vitals:   04/08/22 1020  BP: 110/70  Pulse: 80  SpO2: 97%  Weight: 56 kg (123 lb 6.4 oz)     PHYSICAL EXAM: General:  Well appearing. No resp difficulty HEENT: normal Neck: supple. no JVD. Carotids 2+ bilat; no bruits. No lymphadenopathy or thryomegaly appreciated. Cor: PMI nondisplaced. Regular rate & rhythm. No rubs, gallops or murmurs. Lungs: clear Abdomen: soft, nontender, nondistended. No hepatosplenomegaly. No  bruits or masses. Good bowel sounds. Extremities: no cyanosis, clubbing, rash, edema Neuro: alert & orientedx3, cranial nerves grossly intact. moves all 4 extremities w/o difficulty. Affect pleasant   ECG: NSR 73 No ST-T wave abnormalities. Personally reviewed   ASSESSMENT & PLAN:  1. Palpitations with frequent SVT on the ambulatory monitoring - Echo 5/22/122 EF 60-65% RVSP 35mHg  - Zio 8/23 - Sinus rhythm. 1 six-beat run NSVT, 180 runs of SVT longest 11.5 seconds (mostly 3-7 beats). Rare PACs and PVCs - cMRI 02/21/22 EF 61% no LGE. No evidence of hemochromatosis.  - Angrelide can cause multiple cardiac side effects but I am not certain that angrelide causing this degree of SVT - Palpitations much  improved after stopping angralide  - TSH normal - Suggested cutting back caffeine intake.  - She will complete sleep study  - Can use metoprolol prn  - F/u prn   2. Hemochromatosis - cMRI no evidence of hemochromatosis  3. Snoring  - check [place] sleep study  DGlori Bickers MD  10:53 AM

## 2022-04-12 ENCOUNTER — Telehealth: Payer: Self-pay | Admitting: Hematology and Oncology

## 2022-04-12 NOTE — Telephone Encounter (Signed)
Scheduled appointments per 12/20 los. Patient is aware.

## 2022-04-18 NOTE — Progress Notes (Signed)
HEMATOLOGY-ONCOLOGY TELEPHONE VISIT PROGRESS NOTE  I connected with our patient on 04/22/22 at  8:45 AM EST by telephone and verified that I am speaking with the correct person using two identifiers.  I discussed the limitations, risks, security and privacy concerns of performing an evaluation and management service by telephone and the availability of in person appointments.  I also discussed with the patient that there may be a patient responsible charge related to this service. The patient expressed understanding and agreed to proceed.   History of Present Illness: Kara [name] is a 72 y.o. female with above-mentioned history of thrombocytosis and hereditary hemochromatosis. She presents via telephone today for follow-up .      REVIEW OF SYSTEMS:   Constitutional: Denies fevers, chills or abnormal weight loss All other systems were reviewed with the patient and are negative. Observations/Objective:     Assessment Plan:  Essential thrombocytosis ([place]) Lab: 06/18/21:  Hb 11.2, Platelet 778 07/30/2021: Hemoglobin 12.9, platelets 783, iron saturation 13%, ferritin 7  10/01/2021: Hemoglobin 14.1, platelets 828, ferritin 22 11/02/2021: Hemoglobin 12.9, platelets 435, ferritin 22, iron saturation 54%  10/04/21: Increased anagrelide to Bid 11/26/21: Started alternating Anagrelide with Hydrea  12/16/2021: Hemoglobin 12.9, platelets 632  (discontinued anagrelide, Hydrea 500 mg twice daily) 12/31/2021: Hemoglobin 12.9, platelets 641 01/28/2022: Hemoglobin 12.7, platelets 399, ferritin 31 03/15/2022: Hemoglobin 12.6, platelets 707, ferritin 45, iron saturation 61% 03/22/22: Platelets 552 04/06/2022: Platelets 763 04/20/2022: Platelets 749, iron saturation 54%, ferritin 33, hemoglobin 13.3   Severe palpitations: Related to anagrelide. D/C anagrelide and her palpitations have mostly resolved. Waiting for heart monitor results.   Plan:  Hydroxyurea 500 mg (being held because of hair loss) The sudden rise  in the platelet count is because she was off hydroxyurea.     Adverse Effects: Hair loss: The hair loss is so profound that she is not willing to continue Hydrea.   Patient did facelift. Dr. Sabra Heck performed the surgery.  Starting a new diet FAST 800 diet. So far the platelet count has stabilized. Recheck labs in 4 weeks with labs and follow-up  I discussed the assessment and treatment plan with the patient. The patient was provided an opportunity to ask questions and all were answered. The patient agreed with the plan and demonstrated an understanding of the instructions. The patient was advised to call back or seek an in-person evaluation if the symptoms worsen or if the condition fails to improve as anticipated.   I provided 12 minutes of non-face-to-face time during this encounter.  This includes time for charting and coordination of care   Harriette Ohara, MD  I Gardiner Coins am acting as a scribe for Dr.Avneet Ashmore  I have reviewed the above documentation for accuracy and completeness, and I agree with the above.

## 2022-04-19 ENCOUNTER — Other Ambulatory Visit: Payer: Self-pay | Admitting: *Deleted

## 2022-04-19 ENCOUNTER — Inpatient Hospital Stay: Payer: Medicare Other

## 2022-04-19 DIAGNOSIS — D473 Essential (hemorrhagic) thrombocythemia: Secondary | ICD-10-CM

## 2022-04-20 ENCOUNTER — Inpatient Hospital Stay: Payer: Medicare Other | Attending: Hematology and Oncology

## 2022-04-20 ENCOUNTER — Inpatient Hospital Stay: Payer: Medicare Other

## 2022-04-20 DIAGNOSIS — D473 Essential (hemorrhagic) thrombocythemia: Secondary | ICD-10-CM

## 2022-04-20 LAB — CBC WITH DIFFERENTIAL (CANCER CENTER ONLY)
Abs Immature Granulocytes: 0.04 10*3/uL (ref 0.00–0.07)
Basophils Absolute: 0.1 10*3/uL (ref 0.0–0.1)
Basophils Relative: 1 %
Eosinophils Absolute: 0.5 10*3/uL (ref 0.0–0.5)
Eosinophils Relative: 5 %
HCT: 39.6 % (ref 36.0–46.0)
Hemoglobin: 13.3 g/dL (ref 12.0–15.0)
Immature Granulocytes: 0 %
Lymphocytes Relative: 21 %
Lymphs Abs: 2.1 10*3/uL (ref 0.7–4.0)
MCH: 34.5 pg — ABNORMAL HIGH (ref 26.0–34.0)
MCHC: 33.6 g/dL (ref 30.0–36.0)
MCV: 102.6 fL — ABNORMAL HIGH (ref 80.0–100.0)
Monocytes Absolute: 0.7 10*3/uL (ref 0.1–1.0)
Monocytes Relative: 7 %
Neutro Abs: 6.8 10*3/uL (ref 1.7–7.7)
Neutrophils Relative %: 66 %
Platelet Count: 749 10*3/uL — ABNORMAL HIGH (ref 150–400)
RBC: 3.86 MIL/uL — ABNORMAL LOW (ref 3.87–5.11)
RDW: 13.3 % (ref 11.5–15.5)
WBC Count: 10.1 10*3/uL (ref 4.0–10.5)
nRBC: 0 % (ref 0.0–0.2)

## 2022-04-20 LAB — IRON AND IRON BINDING CAPACITY (CC-WL,HP ONLY)
Iron: 153 ug/dL (ref 28–170)
Saturation Ratios: 54 % — ABNORMAL HIGH (ref 10.4–31.8)
TIBC: 284 ug/dL (ref 250–450)
UIBC: 131 ug/dL

## 2022-04-21 LAB — FERRITIN: Ferritin: 33 ng/mL (ref 11–307)

## 2022-04-22 ENCOUNTER — Inpatient Hospital Stay (HOSPITAL_BASED_OUTPATIENT_CLINIC_OR_DEPARTMENT_OTHER): Payer: Medicare Other | Admitting: Hematology and Oncology

## 2022-04-22 DIAGNOSIS — D473 Essential (hemorrhagic) thrombocythemia: Secondary | ICD-10-CM | POA: Diagnosis not present

## 2022-04-22 NOTE — Assessment & Plan Note (Signed)
Lab: 06/18/21:  Hb 11.2, Platelet 778 07/30/2021: Hemoglobin 12.9, platelets 783, iron saturation 13%, ferritin 7  10/01/2021: Hemoglobin 14.1, platelets 828, ferritin 22 11/02/2021: Hemoglobin 12.9, platelets 435, ferritin 22, iron saturation 54%  10/04/21: Increased anagrelide to Bid 11/26/21: Started alternating Anagrelide with Hydrea  12/16/2021: Hemoglobin 12.9, platelets 632  (discontinued anagrelide, Hydrea 500 mg twice daily) 12/31/2021: Hemoglobin 12.9, platelets 641 01/28/2022: Hemoglobin 12.7, platelets 399, ferritin 31 03/15/2022: Hemoglobin 12.6, platelets 707, ferritin 45, iron saturation 61% 03/22/22: Platelets 552 04/06/2022: Platelets 763 04/20/2022: Platelets 749, iron saturation 54%, ferritin 33, hemoglobin 13.3   Severe palpitations: Related to anagrelide. D/C anagrelide and her palpitations have mostly resolved. Waiting for heart monitor results.   Plan:  Hydroxyurea 500 mg (being held because of hair loss) The sudden rise in the platelet count is because she was off hydroxyurea.     Adverse Effects: Hair loss: The hair loss is so profound that she is not willing to continue Hydrea.   Patient did facelift. Dr. Sabra Heck performed the surgery.  Starting a new diet FAST 800 diet. So far the platelet count has stabilized. Recheck labs in 4 weeks with labs and follow-up

## 2022-04-22 NOTE — Addendum Note (Signed)
Addended by: Nicholas Lose on: 04/22/2022 09:23 AM   Modules accepted: Orders

## 2022-04-26 ENCOUNTER — Telehealth: Payer: Self-pay | Admitting: Hematology and Oncology

## 2022-04-26 NOTE — Telephone Encounter (Signed)
Scheduled appointment per los. The call would not go through at this time. Scheduler will try reaching out to patient again.

## 2022-04-28 ENCOUNTER — Telehealth: Payer: Self-pay | Admitting: Hematology and Oncology

## 2022-04-28 NOTE — Telephone Encounter (Signed)
Scheduled appointments per los. Patient is aware.

## 2022-05-08 ENCOUNTER — Emergency Department (HOSPITAL_BASED_OUTPATIENT_CLINIC_OR_DEPARTMENT_OTHER)
Admission: EM | Admit: 2022-05-08 | Discharge: 2022-05-08 | Disposition: A | Discharge status: Home / Self Care | Payer: Medicare Other | Attending: Medical | Admitting: Medical

## 2022-05-08 ENCOUNTER — Other Ambulatory Visit: Payer: Self-pay

## 2022-05-08 ENCOUNTER — Emergency Department (HOSPITAL_BASED_OUTPATIENT_CLINIC_OR_DEPARTMENT_OTHER): Payer: Medicare Other

## 2022-05-08 ENCOUNTER — Encounter (HOSPITAL_BASED_OUTPATIENT_CLINIC_OR_DEPARTMENT_OTHER): Payer: Self-pay | Admitting: Emergency Medicine

## 2022-05-08 DIAGNOSIS — R059 Cough, unspecified: Secondary | ICD-10-CM | POA: Diagnosis present

## 2022-05-08 DIAGNOSIS — Z1152 Encounter for screening for COVID-19: Secondary | ICD-10-CM | POA: Insufficient documentation

## 2022-05-08 DIAGNOSIS — R519 Headache, unspecified: Secondary | ICD-10-CM | POA: Insufficient documentation

## 2022-05-08 DIAGNOSIS — R051 Acute cough: Secondary | ICD-10-CM | POA: Insufficient documentation

## 2022-05-08 DIAGNOSIS — Z7982 Long term (current) use of aspirin: Secondary | ICD-10-CM | POA: Insufficient documentation

## 2022-05-08 LAB — RESP PANEL BY RT-PCR (RSV, FLU A&B, COVID)  RVPGX2
Influenza A by PCR: NEGATIVE
Influenza B by PCR: NEGATIVE
Resp Syncytial Virus by PCR: NEGATIVE
SARS Coronavirus 2 by RT PCR: NEGATIVE

## 2022-05-08 NOTE — ED Triage Notes (Signed)
Late night Friday, dry cough nonstop and has not gotten better, headache. Covid tested negative,pt has tx with mucinex and tessalon pearls. Headache has gotten worse, coughing more and now has mucus.

## 2022-05-08 NOTE — Discharge Instructions (Signed)
  Please return to the Emergency Department immediately for any new or worsening symptoms . Please be sure to follow up with your Primary Care Provider within one week regarding your visit today; please call their office to schedule an appointment even if you are feeling better for a follow-up visit.    Please read the additional information packets attached to your discharge summary.  Go to the nearest Emergency Department immediately if: You have fever or chills Your headache: Gets very bad quickly. Gets worse after a lot of physical activity. You have any of these symptoms: You continue to vomit. A stiff neck. Trouble seeing. Your eye or ear hurts. Trouble speaking. Weak muscles or you lose muscle control. You lose your balance or have trouble walking. You feel like you will pass out (faint) or you pass out. You are mixed up (confused). You have chest pain You have trouble breathing You cough up yellow or red sputum or blood You have a seizure You have any new/concerning or worsening of symptoms.  Do not take your medicine if  develop an itchy rash, swelling in your mouth or lips, or difficulty breathing; call 911 and seek immediate emergency medical attention if this occurs.  You may review your lab tests and imaging results in their entirety on your MyChart account.  Please discuss all results of fully with your primary care provider and other specialist at your follow-up visit.  Note: Portions of this text may have been transcribed using voice recognition software. Every effort was made to ensure accuracy; however, inadvertent computerized transcription errors may still be present.

## 2022-05-08 NOTE — ED Provider Notes (Signed)
[facility] Provider Note   CSN: [id number] Arrival date & time: 05/08/22  1052     History  No chief complaint on file.   Kara [name] is a 71 y.o. female reports a history of hemochromatosis otherwise healthy no daily medication use.  Patient presents to the emergency department today originally for evaluation of a nonproductive cough that has been going on for 2-3 days along with a mild headache.  She took a COVID test yesterday which was negative.  This morning her cough and headache were somewhat worse and she decided to present to the emergency department.  She is not sure if this is due to a virus or from changing the cat litter at home which was very dusty.  She reports her cough is significantly improved since arrival to the ER.  She also reports her headache has completely resolved.  She reported her headache was diffuse and aching worse with cough improved with rest.  Patient denies numbness, tingling, vision changes, dizziness, loss of balance, nausea, vomiting, neck pain, sore throat, chest pain, shortness of breath, hemoptysis, abdominal pain, weakness or any additional concerns.  HPI     Home Medications Prior to Admission medications   Medication Sig Start Date End Date Taking? Authorizing Provider  aspirin EC 81 MG tablet Take 1 tablet (81 mg total) by mouth daily. 12/20/18   Nicholas Lose, MD  ibuprofen (ADVIL) 800 MG tablet Take 800 mg by mouth as needed.    [provider]      Allergies    Penicillins    Review of Systems   Review of Systems Ten systems are reviewed and are negative for acute change except as noted in the HPI  Physical Exam Updated Vital Signs BP (!) 145/77 (BP Location: Left Arm)   Pulse 80   Temp 98 F (36.7 C) (Oral)   Resp 16   SpO2 97%  Physical Exam Constitutional:      General: She is not in acute distress.    Appearance: Normal appearance. She is well-developed. She is not  ill-appearing or diaphoretic.  HENT:     Head: Normocephalic and atraumatic.     Right Ear: External ear normal.     Left Ear: External ear normal.  Eyes:     General: Vision grossly intact. Gaze aligned appropriately.     Pupils: Pupils are equal, round, and reactive to light.  Neck:     Trachea: Trachea and phonation normal.  Cardiovascular:     Rate and Rhythm: Normal rate and regular rhythm.     Pulses: Normal pulses.     Heart sounds: Normal heart sounds.  Pulmonary:     Effort: Pulmonary effort is normal. No respiratory distress.     Breath sounds: Normal breath sounds.  Abdominal:     General: There is no distension.     Palpations: Abdomen is soft.     Tenderness: There is no abdominal tenderness. There is no guarding or rebound.  Musculoskeletal:        General: Normal range of motion.     Cervical back: Normal range of motion.  Skin:    General: Skin is warm and dry.  Neurological:     Mental Status: She is alert.     GCS: GCS eye subscore is 4. GCS verbal subscore is 5. GCS motor subscore is 6.     Comments: Speech is clear and goal oriented, follows commands Major Cranial nerves without  deficit, no facial droop Moves extremities without ataxia, coordination intact  Psychiatric:        Behavior: Behavior normal.     ED Results / Procedures / Treatments   Labs (all labs ordered are listed, but only abnormal results are displayed) Labs Reviewed  RESP PANEL BY RT-PCR (RSV, FLU A&B, COVID)  [id number]    EKG None  Radiology No results found.  Procedures Procedures    Medications Ordered in ED Medications - No data to display  ED Course/ Medical Decision Making/ A&P                             Medical Decision Making 72 year old female presented for 2-day history of nonproductive cough and headache.  On evaluation she is well-appearing and in no acute distress.  Both her cough and headache have resolved since arrival to the ER, patient is strongly  attributing her symptoms to dusty cat litter at home.  COVID/influenza and RSV tests were negative.  I ordered chest x-ray to rule out pneumonia or other causes of her cough, patient refused.  I also discussed obtaining CT of her head to evaluate her headache as well as basic labs to evaluate for electrolyte derangements or signs of infection patient again declined.  I do long discussion with patient today she is requesting to be discharged at this time she has no concerns reports that she is feeling back to normal.  On exam patient is well-appearing in no acute distress, no obvious cranial nerve deficits, no meningismus, airway clear, cardiopulmonary exam unremarkable, abdomen soft and nontender.  Vital signs stable on room air.  I asked patient to check in with her primary care provider sometime this week and I discussed strict return precautions with patient she stated understanding.  Patient was given multiple opportunities for further treatment here today she refused.     At this time there does not appear to be any evidence of an acute emergency medical condition and the patient appears stable for discharge with appropriate outpatient follow up. Diagnosis was discussed with patient who verbalizes understanding of care plan and is agreeable to discharge. I have discussed return precautions with patient who verbalizes understanding. Patient encouraged to follow-up with their PCP. All questions answered.   Note: Portions of this report may have been transcribed using voice recognition software. Every effort was made to ensure accuracy; however, inadvertent computerized transcription errors may still be present.         Final Clinical Impression(s) / ED Diagnoses Final diagnoses:  Acute cough  Acute nonintractable headache, unspecified headache type    Rx / DC Orders ED Discharge Orders     None         Gari Crown 05/08/22 1449    Fredia Sorrow, MD 05/11/22  [phone]

## 2022-05-08 NOTE — ED Triage Notes (Signed)
Last ibuprofen and tylenol at 0900

## 2022-05-12 ENCOUNTER — Telehealth: Payer: Self-pay

## 2022-05-12 NOTE — Telephone Encounter (Signed)
        Patient  visited [facility] on 1/21  Telephone encounter attempt :  1st  Unable to [place], [facility]  [phone] [address], [place], [place] [address] Phone: [phone] Email: [email or web address]

## 2022-05-13 ENCOUNTER — Telehealth: Payer: Self-pay

## 2022-05-13 NOTE — Telephone Encounter (Signed)
        Patient  visited [facility] on 1/21    Telephone encounter attempt :  2nd  Unable to [facility], [facility]  [phone] [address], [place], [place] [address] Phone: [phone] Email: [email or web address]

## 2022-05-20 ENCOUNTER — Other Ambulatory Visit: Payer: Self-pay

## 2022-05-20 ENCOUNTER — Inpatient Hospital Stay: Payer: Medicare Other | Attending: Hematology and Oncology

## 2022-05-20 DIAGNOSIS — F17210 Nicotine dependence, cigarettes, uncomplicated: Secondary | ICD-10-CM | POA: Insufficient documentation

## 2022-05-20 DIAGNOSIS — D75839 Thrombocytosis, unspecified: Secondary | ICD-10-CM | POA: Insufficient documentation

## 2022-05-20 DIAGNOSIS — D473 Essential (hemorrhagic) thrombocythemia: Secondary | ICD-10-CM | POA: Insufficient documentation

## 2022-05-20 LAB — CBC WITH DIFFERENTIAL (CANCER CENTER ONLY)
Abs Immature Granulocytes: 0.04 10*3/uL (ref 0.00–0.07)
Basophils Absolute: 0.1 10*3/uL (ref 0.0–0.1)
Basophils Relative: 1 %
Eosinophils Absolute: 0.4 10*3/uL (ref 0.0–0.5)
Eosinophils Relative: 4 %
HCT: 39.6 % (ref 36.0–46.0)
Hemoglobin: 13.2 g/dL (ref 12.0–15.0)
Immature Granulocytes: 0 %
Lymphocytes Relative: 20 %
Lymphs Abs: 2 10*3/uL (ref 0.7–4.0)
MCH: 32.8 pg (ref 26.0–34.0)
MCHC: 33.3 g/dL (ref 30.0–36.0)
MCV: 98.5 fL (ref 80.0–100.0)
Monocytes Absolute: 0.7 10*3/uL (ref 0.1–1.0)
Monocytes Relative: 7 %
Neutro Abs: 6.4 10*3/uL (ref 1.7–7.7)
Neutrophils Relative %: 68 %
Platelet Count: 782 10*3/uL — ABNORMAL HIGH (ref 150–400)
RBC: 4.02 MIL/uL (ref 3.87–5.11)
RDW: 12.9 % (ref 11.5–15.5)
WBC Count: 9.5 10*3/uL (ref 4.0–10.5)
nRBC: 0 % (ref 0.0–0.2)

## 2022-05-20 LAB — IRON AND IRON BINDING CAPACITY (CC-WL,HP ONLY)
Iron: 162 ug/dL (ref 28–170)
Saturation Ratios: 64 % — ABNORMAL HIGH (ref 10.4–31.8)
TIBC: 253 ug/dL (ref 250–450)
UIBC: 91 ug/dL — ABNORMAL LOW (ref 148–442)

## 2022-05-20 LAB — FERRITIN: Ferritin: 42 ng/mL (ref 11–307)

## 2022-05-20 LAB — FOLATE: Folate: 13.3 ng/mL (ref >5.9)

## 2022-05-20 LAB — VITAMIN B12: Vitamin B-12: 222 pg/mL (ref 180–914)

## 2022-05-23 ENCOUNTER — Inpatient Hospital Stay (HOSPITAL_BASED_OUTPATIENT_CLINIC_OR_DEPARTMENT_OTHER): Payer: Medicare Other | Admitting: Hematology and Oncology

## 2022-05-23 ENCOUNTER — Telehealth: Payer: Self-pay | Admitting: Hematology and Oncology

## 2022-05-23 DIAGNOSIS — D473 Essential (hemorrhagic) thrombocythemia: Secondary | ICD-10-CM | POA: Diagnosis not present

## 2022-05-23 NOTE — Telephone Encounter (Signed)
Scheduled appointment per 2/5 los. Patient is aware.

## 2022-05-23 NOTE — Assessment & Plan Note (Signed)
Lab: 06/18/21:  Hb 11.2, Platelet 778 07/30/2021: Hemoglobin 12.9, platelets 783, iron saturation 13%, ferritin 7  10/01/2021: Hemoglobin 14.1, platelets 828, ferritin 22 11/02/2021: Hemoglobin 12.9, platelets 435, ferritin 22, iron saturation 54%  10/04/21: Increased anagrelide to Bid 11/26/21: Started alternating Anagrelide with Hydrea  12/16/2021: Hemoglobin 12.9, platelets 632  (discontinued anagrelide, Hydrea 500 mg twice daily) 12/31/2021: Hemoglobin 12.9, platelets 641 01/28/2022: Hemoglobin 12.7, platelets 399, ferritin 31 03/15/2022: Hemoglobin 12.6, platelets 707, ferritin 45, iron saturation 61% 03/22/22: Platelets 552 04/06/2022: Platelets 763 04/20/2022: Platelets 749, iron saturation 54%, ferritin 33, hemoglobin 13.3   Severe palpitations: Related to anagrelide. D/C anagrelide and her palpitations have mostly resolved. Waiting for heart monitor results.   Plan:  Hydroxyurea 500 mg (being held because of hair loss) The sudden rise in the platelet count is because she was off hydroxyurea.     Adverse Effects: Hair loss: The hair loss is so profound that she is not willing to continue Hydrea.   Patient did facelift. Dr. Sabra Heck performed the surgery.  Starting a new diet FAST 800 diet. So far the platelet count has stabilized. Recheck labs in 4 weeks with labs and follow-up

## 2022-05-23 NOTE — Progress Notes (Signed)
HEMATOLOGY-ONCOLOGY TELEPHONE VISIT PROGRESS NOTE  I connected with our patient on 05/23/22 at  3:15 PM EST by telephone and verified that I am speaking with the correct person using two identifiers.  I discussed the limitations, risks, security and privacy concerns of performing an evaluation and management service by telephone and the availability of in person appointments.  I also discussed with the patient that there may be a patient responsible charge related to this service. The patient expressed understanding and agreed to proceed.   History of Present Illness: Follow-up to discuss the results of recently performed blood work.  She reports that there is been a lot of stress related to her work because of extensive losses from her employer and that her pay has been cut down markedly.  Otherwise she started herself on a diet and is sticking with it and doing well.    REVIEW OF SYSTEMS:   Constitutional: Denies fevers, chills or abnormal weight loss All other systems were reviewed with the patient and are negative. Observations/Objective:     Assessment Plan:  Essential thrombocytosis ([place]) Lab: 06/18/21:  Hb 11.2, Platelet 778 07/30/2021: Hemoglobin 12.9, platelets 783, iron saturation 13%, ferritin 7  10/01/2021: Hemoglobin 14.1, platelets 828, ferritin 22 11/02/2021: Hemoglobin 12.9, platelets 435, ferritin 22, iron saturation 54%  10/04/21: Increased anagrelide to Bid 11/26/21: Started alternating Anagrelide with Hydrea  12/16/2021: Hemoglobin 12.9, platelets 632  (discontinued anagrelide, Hydrea 500 mg twice daily) 12/31/2021: Hemoglobin 12.9, platelets 641 01/28/2022: Hemoglobin 12.7, platelets 399, ferritin 31 03/15/2022: Hemoglobin 12.6, platelets 707, ferritin 45, iron saturation 61% 03/22/22: Platelets 552 04/06/2022: Platelets 763 05/20/2022: Platelets 782, hemoglobin 13.2, iron saturation 64%, ferritin 42, folate 13.3, B12 222   Severe palpitations: Related to anagrelide. D/C  anagrelide and her palpitations have mostly resolved. Waiting for heart monitor results.   Plan:  Hydroxyurea 500 mg (being held because of hair loss) The sudden rise in the platelet count is because she was off hydroxyurea.     Adverse Effects: Hair loss: The hair loss is so profound that she is not willing to continue Hydrea.   Patient did facelift. Dr. Sabra Heck performed the surgery.  Starting a new diet FAST 800 diet. So far the platelet count has stabilized. Recheck labs in 4 weeks with labs and follow-up   I discussed the assessment and treatment plan with the patient. The patient was provided an opportunity to ask questions and all were answered. The patient agreed with the plan and demonstrated an understanding of the instructions. The patient was advised to call back or seek an in-person evaluation if the symptoms worsen or if the condition fails to improve as anticipated.   I provided 12 minutes of non-face-to-face time during this encounter.  This includes time for charting and coordination of care   Harriette Ohara, MD

## 2022-05-29 ENCOUNTER — Other Ambulatory Visit: Payer: Self-pay

## 2022-05-29 ENCOUNTER — Emergency Department (HOSPITAL_BASED_OUTPATIENT_CLINIC_OR_DEPARTMENT_OTHER)
Admission: EM | Admit: 2022-05-29 | Discharge: 2022-05-29 | Disposition: A | Discharge status: Home / Self Care | Payer: Medicare Other | Attending: Emergency Medicine | Admitting: Emergency Medicine

## 2022-05-29 DIAGNOSIS — B029 Zoster without complications: Secondary | ICD-10-CM | POA: Diagnosis not present

## 2022-05-29 DIAGNOSIS — R21 Rash and other nonspecific skin eruption: Secondary | ICD-10-CM | POA: Diagnosis present

## 2022-05-29 MED ORDER — ACYCLOVIR 800 MG PO TABS: 800.0000 mg | ORAL_TABLET | Freq: Every day | ORAL | 0 refills | Status: AC

## 2022-05-29 NOTE — ED Provider Notes (Signed)
Emergency Department Provider Note   I have reviewed the triage vital signs and the nursing notes.   HISTORY  Chief Complaint Rash   HPI Kara [name] is a 72 y.o. female presents to the ED with painful rash developing over the right chest. She notes perioral rash what has also been present for some time but seems improved with ointment from her dermatologist but the chest rash seems more severe. Notes mild pain with rash asa well. No fever.     Past Medical History:  Diagnosis Date   Hemochromatosis     Review of Systems  Constitutional: No fever/chills Cardiovascular: Denies chest pain. Respiratory: Denies shortness of breath. Gastrointestinal: No abdominal pain.  No nausea, no vomiting.  No diarrhea.  No constipation. Genitourinary: Negative for dysuria. Musculoskeletal: Negative for back pain. Skin: Positive for rash. Neurological: Negative for headaches.  ____________________________________________   PHYSICAL EXAM:  VITAL SIGNS: ED Triage Vitals  Enc Vitals Group     BP 05/29/22 1038 (!) 172/99     Pulse Rate 05/29/22 1038 76     Resp 05/29/22 1038 16     Temp 05/29/22 1038 98.9 F (37.2 C)     Temp Source 05/29/22 1130 Oral     SpO2 05/29/22 1038 96 %     Weight 05/29/22 1039 123 lb 7.3 oz (56 kg)     Height 05/29/22 1039 5' 1"$  (1.549 m)   Constitutional: Alert and oriented. Well appearing and in no acute distress. Eyes: Conjunctivae are normal. Head: Atraumatic. Nose: No congestion/rhinnorhea. Mouth/Throat: Mucous membranes are moist.  Neck: No stridor.  Cardiovascular: Normal rate, regular rhythm. Good peripheral circulation. Grossly normal heart sounds.   Respiratory: Normal respiratory effort.  No retractions. Lungs CTAB. Gastrointestinal: No distention.  Musculoskeletal: No lower extremity tenderness nor edema. No gross deformities of extremities. Neurologic:  Normal speech and language. No gross focal neurologic deficits are appreciated.   Skin: Blistering rash on erythematous base to the right chest and extending to a lesser extent to the back in a dermatomal distribution.    ____________________________________________   PROCEDURES  Procedure(s) performed:   Procedures  None ____________________________________________   INITIAL IMPRESSION / ASSESSMENT AND PLAN / ED COURSE  Pertinent labs & imaging results that were available during my care of the patient were reviewed by me and considered in my medical decision making (see chart for details).   This patient is Presenting for Evaluation of rash, which does require a range of treatment options, and is a complaint that involves a moderate risk of morbidity and mortality.  The Differential Diagnoses include shingles, allergic rash, cellulitis, TEN/SJS, etc.   Medical Decision Making: Summary:  Patient with dermatomal rash to the right chest seems most consistent with shingles. Mild perioral rash seems different and is improving with cream provided by dermatology. Pain fairly minimal at this time. Discussed mgmt if pain worsens and will start anti-viral medications.    Patient's presentation is most consistent with acute, uncomplicated illness.   Disposition: discharge  ____________________________________________  FINAL CLINICAL IMPRESSION(S) / ED DIAGNOSES  Final diagnoses:  Herpes zoster without complication     NEW OUTPATIENT MEDICATIONS STARTED DURING THIS VISIT:  Discharge Medication List as of 05/29/2022 12:13 PM     START taking these medications   Details  acyclovir (ZOVIRAX) 800 MG tablet Take 1 tablet (800 mg total) by mouth 5 (five) times daily for 10 days., Starting Sun 05/29/2022, Until Wed 06/08/2022, Normal        Note:  This document was prepared using Dragon voice recognition software and may include unintentional dictation errors.  Nanda Quinton, MD, [facility]    Lysa Livengood, [facility], MD 06/01/22 1346

## 2022-05-29 NOTE — ED Notes (Signed)
Discharge instructions, follow up care, and prescription reviewed and explained, pt verbalized understanding and had no further questions on d/c. Pt caox4, ambulatory, NAD on d/c.

## 2022-05-29 NOTE — ED Triage Notes (Signed)
Patient arrives with complaints of new onset rash to chest x1 day. Patient states that she gets laser treatments to face and occasionally gets rashes.   She tried some ointments with minimal relief. Reports 3/10 pain.

## 2022-05-29 NOTE — Discharge Instructions (Signed)
Please take medication as prescribed.

## 2022-06-02 ENCOUNTER — Other Ambulatory Visit: Payer: Medicare Other

## 2022-06-03 ENCOUNTER — Telehealth: Payer: Self-pay

## 2022-06-03 NOTE — Telephone Encounter (Signed)
Returned Pts call regarding event for tomorrow. Pt states she has gift cards for the raffle tomorrow tonight. Instructed Pt that she could bring gift cards to the cancer center and they would be taken to event.

## 2022-06-08 ENCOUNTER — Telehealth: Payer: Self-pay | Admitting: *Deleted

## 2022-06-08 NOTE — Telephone Encounter (Signed)
Received call from pt with complaint of overall not feeling well and fatigue.  Pt states she was recently diagnosed with shingles and has completed her course of acyclovir. Pt requesting lab appt because she feels her symptoms could be related to her hemochromatosis.  Appt scheduled with telephone visit the following day.  Pt educated and verbalized understanding.

## 2022-06-09 ENCOUNTER — Other Ambulatory Visit: Payer: Self-pay

## 2022-06-09 ENCOUNTER — Inpatient Hospital Stay: Payer: Medicare Other

## 2022-06-09 DIAGNOSIS — D75839 Thrombocytosis, unspecified: Secondary | ICD-10-CM | POA: Diagnosis not present

## 2022-06-09 DIAGNOSIS — F17210 Nicotine dependence, cigarettes, uncomplicated: Secondary | ICD-10-CM | POA: Diagnosis not present

## 2022-06-09 DIAGNOSIS — D473 Essential (hemorrhagic) thrombocythemia: Secondary | ICD-10-CM

## 2022-06-09 LAB — CBC WITH DIFFERENTIAL (CANCER CENTER ONLY)
Abs Immature Granulocytes: 0.03 10*3/uL (ref 0.00–0.07)
Basophils Absolute: 0 10*3/uL (ref 0.0–0.1)
Basophils Relative: 0 %
Eosinophils Absolute: 0.3 10*3/uL (ref 0.0–0.5)
Eosinophils Relative: 4 %
HCT: 42.8 % (ref 36.0–46.0)
Hemoglobin: 14.4 g/dL (ref 12.0–15.0)
Immature Granulocytes: 0 %
Lymphocytes Relative: 21 %
Lymphs Abs: 2 10*3/uL (ref 0.7–4.0)
MCH: 32.3 pg (ref 26.0–34.0)
MCHC: 33.6 g/dL (ref 30.0–36.0)
MCV: 96 fL (ref 80.0–100.0)
Monocytes Absolute: 0.7 10*3/uL (ref 0.1–1.0)
Monocytes Relative: 7 %
Neutro Abs: 6.4 10*3/uL (ref 1.7–7.7)
Neutrophils Relative %: 68 %
Platelet Count: 826 10*3/uL — ABNORMAL HIGH (ref 150–400)
RBC: 4.46 MIL/uL (ref 3.87–5.11)
RDW: 13.4 % (ref 11.5–15.5)
WBC Count: 9.5 10*3/uL (ref 4.0–10.5)
nRBC: 0 % (ref 0.0–0.2)

## 2022-06-09 LAB — IRON AND IRON BINDING CAPACITY (CC-WL,HP ONLY)
Iron: 151 ug/dL (ref 28–170)
Saturation Ratios: 53 % — ABNORMAL HIGH (ref 10.4–31.8)
TIBC: 287 ug/dL (ref 250–450)
UIBC: 136 ug/dL — ABNORMAL LOW (ref 148–442)

## 2022-06-09 LAB — FERRITIN: Ferritin: 38 ng/mL (ref 11–307)

## 2022-06-10 ENCOUNTER — Inpatient Hospital Stay (HOSPITAL_BASED_OUTPATIENT_CLINIC_OR_DEPARTMENT_OTHER): Payer: Medicare Other | Admitting: Adult Health

## 2022-06-10 ENCOUNTER — Encounter: Payer: Self-pay | Admitting: Adult Health

## 2022-06-10 DIAGNOSIS — D473 Essential (hemorrhagic) thrombocythemia: Secondary | ICD-10-CM | POA: Diagnosis not present

## 2022-06-10 MED ORDER — HYDROXYUREA 500 MG PO CAPS: 500.0000 mg | ORAL_CAPSULE | Freq: Every day | ORAL | 0 refills | Status: DC

## 2022-06-10 NOTE — Assessment & Plan Note (Addendum)
Wound is a 72 year old woman with history of essential thrombocytosis.  She has been off of her treatment since November 2023.  With her recent shingles infection she likely has increased inflammation and her platelets are up to 826 today.  Considering this along with her head fullness symptomatology I recommended that she take Hydrea 500 mg daily for 3 days until her appointment at [facility] to discuss alternative options since she has palpitations with anagrelide and hair loss with Hydrea.  She will return in about 6 days for lab testing and we will see her back virtually in a week to review her results and discuss what she learned at her consultation at [facility].  I reviewed this with Dr. Lindi Adie via telephone who is in agreement with this plan.

## 2022-06-10 NOTE — Assessment & Plan Note (Signed)
She does not require phlebotomy today.  We will continue to monitor her lab testing.

## 2022-06-10 NOTE — Progress Notes (Signed)
[facility] Follow up:    Pcp, No No address on file  I connected with Kara [name] on 06/10/22 at  8:30 AM EST by telephone and verified that I am speaking with the correct person using two identifiers.  I discussed the limitations, risks, security and privacy concerns of performing an evaluation and management service by telephone and the availability of in person appointments.  I also discussed with the patient that there may be a patient responsible charge related to this service. The patient expressed understanding and agreed to proceed.  Patient location: home Provider location: [facility] office  DIAGNOSIS: Essential thrombocythemia; Hereditary Hemochromatosis  SUMMARY OF HEMATOLOGIC HISTORY: Hereditary hemachromatosis dates back to 2000.  She established with [facility] 05/2016 and underwent genetic testing revealing Hereditary hemachromatosis homozygous [id number] mutation. 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   JAK-2 [id number] POSITIVE Began ASA '81mg'$  daily on 05/22/2018. Began Anagrelide '1mg'$  daily 09/14/2018 Changed to Hydrea 12/20/2018 + aspirin '81mg'$  Discontinued Hydrea, started Anagrelide in 04/2021 daily Anagrelide increased to BID on 10/04/2021 11/26/2021 Anagrelide alternating with Hydrea due to palpitations 12/21/2021 Anagrelide discontinued due to palpitations, Hydrea restarted 03/07/2022--Hydrea discontinued related to profound hair loss, she wants to explore alternative options to Anagrelide and Hydrea)  CURRENT THERAPY: observation  INTERVAL HISTORY: Kara [name] 72 y.o. female returns for f/u to discuss her recent lab results.  She recently had shingles and struggled with this on 05/29/2022.  She has been  taking Acyclovir.  She noted that acyclovir can increase RBC and plts.  She recently stopped acyclovir a couple of days ago and believes her plt count is increased from yesterday because of this.  She feels swollen and is having pain on her right side from the shingles.    She feels some head fullness and headaches.  She has previously taken hydrea that caused significant hair loss, and Anagrelide caused significant palpitations.  She has a new patient evaluation at [facility] on Tuesday to evaluate the potential for other treatments for the essential thrombocythemia.     Patient Active Problem List   Diagnosis Date Noted   Essential thrombocytosis ([place]) 05/22/2018   Chronic migraine 11/21/2017   Hereditary hemochromatosis ([place]) 06/01/2016   Benign paroxysmal positional vertigo 05/20/2016   Orthostatic hypotension 05/20/2016   Nasal obstruction 05/19/2016   Light headedness 04/28/2016   Screening for hyperlipidemia 04/28/2016    is allergic to penicillins and tramadol.  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   Highest level education:  Masters    Right-handed.   Caffeine use: 4 cups per day.   Social Determinants of Health   Financial Resource Strain: Not on file  Food Insecurity: Not on file  Transportation Needs: Not on file  Physical Activity: Not on file  Stress: Not on file  Social Connections: Not on file  Intimate Partner Violence: Not on file    FAMILY HISTORY: Family History  Problem Relation Age of Onset   Healthy Mother    Acute myelogenous leukemia Father    Bipolar disorder Sister    Diabetes Neg Hx    Cancer Neg Hx    Heart failure Neg Hx    Hyperlipidemia Neg Hx    Hypertension Neg Hx     Review of Systems  Constitutional:  Positive for fatigue. Negative for appetite change, chills, fever and unexpected weight change.  HENT:   Negative for hearing loss, lump/mass and trouble swallowing.   Eyes:  Negative for eye problems and icterus.  Respiratory:  Negative for chest tightness, cough and shortness of breath.   Cardiovascular:  Negative for chest pain, leg swelling and palpitations.  Gastrointestinal:  Negative for abdominal distention, abdominal pain, constipation, diarrhea, nausea and vomiting.  Endocrine: Negative for hot flashes.  Genitourinary:  Negative for difficulty urinating.   Musculoskeletal:  Negative for arthralgias.  Skin:  Negative for itching and rash.  Neurological:  Positive for headaches. Negative for dizziness, extremity weakness and numbness.  Hematological:  Negative for adenopathy. Does not bruise/bleed easily.  Psychiatric/Behavioral:  Negative for depression. The patient is not nervous/anxious.       PHYSICAL EXAMINATION Patient sounds well she is in no apparent distress mood and behavior are normal speech is normal.  LABORATORY DATA:  CBC    Component Value Date/Time   WBC 9.5 06/09/2022 1041   WBC 15.4 (H) 11/04/2019 1325   RBC 4.46 06/09/2022 1041   HGB 14.4 06/09/2022 1041   HGB 13.4 03/23/2017 1439   HCT 42.8 06/09/2022 1041   HCT 40.7 03/23/2017 1439   PLT  826 (H) 06/09/2022 1041   PLT 606 (H) 03/23/2017 1439   MCV 96.0 06/09/2022 1041   MCV 77.8 (L) 03/23/2017 1439   MCH 32.3 06/09/2022 1041   MCHC 33.6 06/09/2022 1041   RDW 13.4 06/09/2022 1041   RDW 23.1 (H) 03/23/2017 1439   LYMPHSABS 2.0 06/09/2022 1041   LYMPHSABS 2.9 03/23/2017 1439   MONOABS 0.7 06/09/2022 1041   MONOABS 0.8 03/23/2017 1439   EOSABS 0.3 06/09/2022 1041   EOSABS 0.3 03/23/2017 1439   BASOSABS 0.0 06/09/2022 1041   BASOSABS 0.1 03/23/2017 1439    CMP     Component Value Date/Time   NA 142 12/16/2021 1430   K 4.9 12/16/2021 1430   CL 106 12/16/2021 1430   CO2 21 12/16/2021 1430   GLUCOSE 100 (H) 12/16/2021 1430   GLUCOSE 85 11/02/2021 1548   BUN 13 12/16/2021 1430   CREATININE 0.57 12/16/2021 1430   CREATININE 0.44 11/02/2021 1548   CALCIUM 9.6 12/16/2021 1430   PROT 6.9 12/16/2021 1430   ALBUMIN 4.8 12/16/2021 1430   AST 17 12/16/2021 1430   AST 16 11/02/2021 1548   ALT 10 12/16/2021 1430   ALT 12 11/02/2021 1548   ALKPHOS 69 12/16/2021  1430   BILITOT 0.2 12/16/2021 1430   BILITOT 0.4 11/02/2021 1548   GFRNONAA >60 11/02/2021 1548   GFRAA >60 11/04/2019 1325   GFRAA >60 11/04/2019 0816      ASSESSMENT and THERAPY PLAN:   Essential thrombocytosis ([place]) Wound is a 72 year old woman with history of essential thrombocytosis.  She has been off of her treatment since November 2023.  With her recent shingles infection she likely has increased inflammation and her platelets are up to 826 today.  Considering this along with her head fullness symptomatology I recommended that she take Hydrea 500 mg daily for 3 days until her appointment at [facility] to discuss alternative options since she has palpitations with anagrelide and hair loss with Hydrea.  She will return in about 6 days for lab testing and we will see her back virtually in a week to review her results and discuss what she learned at her consultation at [facility]  I reviewed this with Dr. Lindi Adie via  telephone who is in agreement with this plan.  Hereditary hemochromatosis ([place]) She does not require phlebotomy today.  We will continue to monitor her lab testing.    All questions were answered. The patient knows to call the clinic with any problems, questions or concerns. We can certainly see the patient much sooner if necessary.  The patient was provided an opportunity to ask questions and all were answered. The patient agreed with the plan and demonstrated an understanding of the instructions.   The patient was advised to call back or seek an in-person evaluation if the symptoms worsen or if the condition fails to improve as anticipated.   I provided 25 minutes of non face-to-face telephone visit time during this encounter, and > 50% was spent counseling as documented under my assessment & plan.  Wilber Bihari, NP 06/10/22 4:15 PM Medical Oncology and Hematology [facility], [place] [address] Tel. [phone]    Fax. [phone]

## 2022-06-14 DIAGNOSIS — D473 Essential (hemorrhagic) thrombocythemia: Secondary | ICD-10-CM | POA: Diagnosis not present

## 2022-06-14 DIAGNOSIS — D471 Chronic myeloproliferative disease: Secondary | ICD-10-CM | POA: Diagnosis not present

## 2022-06-16 NOTE — Progress Notes (Signed)
HEMATOLOGY-ONCOLOGY TELEPHONE VISIT PROGRESS NOTE  I connected with our patient on 06/22/22 at  8:00 AM EST by telephone and verified that I am speaking with the correct person using two identifiers.  I discussed the limitations, risks, security and privacy concerns of performing an evaluation and management service by telephone and the availability of in person appointments.  I also discussed with the patient that there may be a patient responsible charge related to this service. The patient expressed understanding and agreed to proceed.   History of Present Illness: na Kara [name] is a 72 y.o. female with above-mentioned history of thrombocytosis and hereditary hemochromatosis. She presents via telephone today for follow-up to discuss the results of labs. She's still having issues with Shingles.    REVIEW OF SYSTEMS:   Constitutional: Denies fevers, chills or abnormal weight loss All other systems were reviewed with the patient and are negative. Observations/Objective:     Assessment Plan:  Essential thrombocytosis ([place]) Lab: 06/18/21:  Hb 11.2, Platelet 778 07/30/2021: Hemoglobin 12.9, platelets 783, iron saturation 13%, ferritin 7  10/01/2021: Hemoglobin 14.1, platelets 828, ferritin 22 11/02/2021: Hemoglobin 12.9, platelets 435, ferritin 22, iron saturation 54%  10/04/21: Increased anagrelide to Bid 11/26/21: Started alternating Anagrelide with Hydrea  12/16/2021: Hemoglobin 12.9, platelets 632  (discontinued anagrelide, Hydrea 500 mg twice daily) 12/31/2021: Hemoglobin 12.9, platelets 641 01/28/2022: Hemoglobin 12.7, platelets 399, ferritin 31 03/15/2022: Hemoglobin 12.6, platelets 707, ferritin 45, iron saturation 61% 03/22/22: Platelets 552 04/06/2022: Platelets 763 05/20/2022: Platelets 782, hemoglobin 13.2, iron saturation 64%, ferritin 42, folate 13.3, B12 222 06/09/22: platelets 826 (shingles) 06/21/2022: Platelets 701, iron saturation 66%, ferritin 31   Severe palpitations: Related to  anagrelide. D/C anagrelide and her palpitations have mostly resolved. Waiting for heart monitor results.   Plan:  Hydroxyurea 500 mg (being held because of hair loss)  Facial Plastic surgery (Dr.Miller): Nov 20th 2023 Shingles: treated with acyclovir  Feb 2024 Patient saw Dr. Almyra Free at [facility] who recommended considering treatment with Jakafi 5 mg p.o. twice daily. With the platelet count coming down spontaneously, we decided to watch and monitor.  Recheck labs in 1 month  I discussed the assessment and treatment plan with the patient. The patient was provided an opportunity to ask questions and all were answered. The patient agreed with the plan and demonstrated an understanding of the instructions. The patient was advised to call back or seek an in-person evaluation if the symptoms worsen or if the condition fails to improve as anticipated.   I provided 12 minutes of non-face-to-face time during this encounter.  This includes time for charting and coordination of care   Harriette Ohara, MD  I Gardiner Coins am acting as a scribe for Dr.Vinay Gudena  I have reviewed the above documentation for accuracy and completeness, and I agree with the above.

## 2022-06-17 ENCOUNTER — Other Ambulatory Visit: Payer: Self-pay | Admitting: *Deleted

## 2022-06-17 ENCOUNTER — Inpatient Hospital Stay: Payer: Medicare Other

## 2022-06-17 DIAGNOSIS — D473 Essential (hemorrhagic) thrombocythemia: Secondary | ICD-10-CM

## 2022-06-20 ENCOUNTER — Inpatient Hospital Stay: Payer: Medicare Other | Admitting: Hematology and Oncology

## 2022-06-21 ENCOUNTER — Inpatient Hospital Stay: Payer: Medicare Other | Attending: Hematology and Oncology

## 2022-06-21 ENCOUNTER — Other Ambulatory Visit: Payer: Self-pay

## 2022-06-21 DIAGNOSIS — R21 Rash and other nonspecific skin eruption: Secondary | ICD-10-CM | POA: Insufficient documentation

## 2022-06-21 DIAGNOSIS — D473 Essential (hemorrhagic) thrombocythemia: Secondary | ICD-10-CM | POA: Diagnosis not present

## 2022-06-21 LAB — CBC WITH DIFFERENTIAL (CANCER CENTER ONLY)
Abs Immature Granulocytes: 0.02 10*3/uL (ref 0.00–0.07)
Basophils Absolute: 0 10*3/uL (ref 0.0–0.1)
Basophils Relative: 1 %
Eosinophils Absolute: 0.2 10*3/uL (ref 0.0–0.5)
Eosinophils Relative: 2 %
HCT: 38.9 % (ref 36.0–46.0)
Hemoglobin: 13.4 g/dL (ref 12.0–15.0)
Immature Granulocytes: 0 %
Lymphocytes Relative: 21 %
Lymphs Abs: 1.7 10*3/uL (ref 0.7–4.0)
MCH: 32.3 pg (ref 26.0–34.0)
MCHC: 34.4 g/dL (ref 30.0–36.0)
MCV: 93.7 fL (ref 80.0–100.0)
Monocytes Absolute: 0.5 10*3/uL (ref 0.1–1.0)
Monocytes Relative: 6 %
Neutro Abs: 5.7 10*3/uL (ref 1.7–7.7)
Neutrophils Relative %: 70 %
Platelet Count: 701 10*3/uL — ABNORMAL HIGH (ref 150–400)
RBC: 4.15 MIL/uL (ref 3.87–5.11)
RDW: 13.9 % (ref 11.5–15.5)
WBC Count: 8.1 10*3/uL (ref 4.0–10.5)
nRBC: 0 % (ref 0.0–0.2)

## 2022-06-21 LAB — IRON AND IRON BINDING CAPACITY (CC-WL,HP ONLY)
Iron: 185 ug/dL — ABNORMAL HIGH (ref 28–170)
Saturation Ratios: 66 % — ABNORMAL HIGH (ref 10.4–31.8)
TIBC: 281 ug/dL (ref 250–450)
UIBC: 96 ug/dL — ABNORMAL LOW (ref 148–442)

## 2022-06-21 LAB — FERRITIN: Ferritin: 31 ng/mL (ref 11–307)

## 2022-06-22 ENCOUNTER — Inpatient Hospital Stay (HOSPITAL_BASED_OUTPATIENT_CLINIC_OR_DEPARTMENT_OTHER): Payer: Medicare Other | Admitting: Hematology and Oncology

## 2022-06-22 DIAGNOSIS — D473 Essential (hemorrhagic) thrombocythemia: Secondary | ICD-10-CM

## 2022-06-22 NOTE — Assessment & Plan Note (Signed)
Lab: 06/18/21:  Hb 11.2, Platelet 778 07/30/2021: Hemoglobin 12.9, platelets 783, iron saturation 13%, ferritin 7  10/01/2021: Hemoglobin 14.1, platelets 828, ferritin 22 11/02/2021: Hemoglobin 12.9, platelets 435, ferritin 22, iron saturation 54%  10/04/21: Increased anagrelide to Bid 11/26/21: Started alternating Anagrelide with Hydrea  12/16/2021: Hemoglobin 12.9, platelets 632  (discontinued anagrelide, Hydrea 500 mg twice daily) 12/31/2021: Hemoglobin 12.9, platelets 641 01/28/2022: Hemoglobin 12.7, platelets 399, ferritin 31 03/15/2022: Hemoglobin 12.6, platelets 707, ferritin 45, iron saturation 61% 03/22/22: Platelets 552 04/06/2022: Platelets 763 05/20/2022: Platelets 782, hemoglobin 13.2, iron saturation 64%, ferritin 42, folate 13.3, B12 222 06/21/2022: Platelets 701, iron saturation 66%, ferritin 31   Severe palpitations: Related to anagrelide. D/C anagrelide and her palpitations have mostly resolved. Waiting for heart monitor results.   Plan:  Hydroxyurea 500 mg (being held because of hair loss) The sudden rise in the platelet count is because she was off hydroxyurea.   Patient saw Dr. Almyra Free at [facility] who recommended considering treatment with Jakafi 5 mg p.o. twice daily.

## 2022-06-29 DIAGNOSIS — M9905 Segmental and somatic dysfunction of pelvic region: Secondary | ICD-10-CM | POA: Diagnosis not present

## 2022-06-29 DIAGNOSIS — M9903 Segmental and somatic dysfunction of lumbar region: Secondary | ICD-10-CM | POA: Diagnosis not present

## 2022-06-29 DIAGNOSIS — M5136 Other intervertebral disc degeneration, lumbar region: Secondary | ICD-10-CM | POA: Diagnosis not present

## 2022-06-29 DIAGNOSIS — M9904 Segmental and somatic dysfunction of sacral region: Secondary | ICD-10-CM | POA: Diagnosis not present

## 2022-06-30 ENCOUNTER — Telehealth: Payer: Self-pay

## 2022-06-30 NOTE — Telephone Encounter (Signed)
Pt called to ask about recommendation for acupuncturist in the area. Gave information for [facility]. She verbalized thanks and understanding.

## 2022-07-05 ENCOUNTER — Telehealth: Payer: Self-pay

## 2022-07-05 NOTE — Telephone Encounter (Signed)
Pt called with concerns of swelling to her incision site per plastic surgery. She is concerned the shingles she had to her chest has travelled. Denies herpetic lesions to the site and only reports swelling, no erythema or heat.  Advised pt to contact her surgeon for further eval. She states she would like phone visit with Dr Lindi Adie to discuss because her surgeon is in [place], and she does not have a PCP. Pt verbalizes she only trusts Dr Lindi Adie.  Advised pt since this is not a hematological issue, she would need to establish a PCP. Pt asks if she can have Infectious Disease referral. Advised pt she would need failed therapy indication for ID consult, and again, would need this via PCP. She was given a list of [place] PCPs and she states she will call.

## 2022-07-06 ENCOUNTER — Encounter: Payer: Self-pay | Admitting: Adult Health

## 2022-07-06 ENCOUNTER — Telehealth: Payer: Self-pay

## 2022-07-06 NOTE — Telephone Encounter (Signed)
Returned Pt's call regarding lab and provider appts. Pt requesting sooner lab/MD appt than 07/22/22. Pt scheduled for lab appt tomorrow and telephone visit with Wilber Bihari, NP on 07/08/22. Pt verbalized understanding.

## 2022-07-07 ENCOUNTER — Telehealth: Payer: Self-pay

## 2022-07-07 ENCOUNTER — Inpatient Hospital Stay: Payer: Medicare Other

## 2022-07-07 ENCOUNTER — Other Ambulatory Visit: Payer: Self-pay

## 2022-07-07 DIAGNOSIS — R21 Rash and other nonspecific skin eruption: Secondary | ICD-10-CM | POA: Diagnosis not present

## 2022-07-07 DIAGNOSIS — D473 Essential (hemorrhagic) thrombocythemia: Secondary | ICD-10-CM | POA: Diagnosis not present

## 2022-07-07 LAB — CBC WITH DIFFERENTIAL (CANCER CENTER ONLY)
Abs Immature Granulocytes: 0.02 10*3/uL (ref 0.00–0.07)
Basophils Absolute: 0 10*3/uL (ref 0.0–0.1)
Basophils Relative: 0 %
Eosinophils Absolute: 0.3 10*3/uL (ref 0.0–0.5)
Eosinophils Relative: 3 %
HCT: 43.5 % (ref 36.0–46.0)
Hemoglobin: 14.6 g/dL (ref 12.0–15.0)
Immature Granulocytes: 0 %
Lymphocytes Relative: 20 %
Lymphs Abs: 1.9 10*3/uL (ref 0.7–4.0)
MCH: 31.5 pg (ref 26.0–34.0)
MCHC: 33.6 g/dL (ref 30.0–36.0)
MCV: 93.8 fL (ref 80.0–100.0)
Monocytes Absolute: 0.5 10*3/uL (ref 0.1–1.0)
Monocytes Relative: 6 %
Neutro Abs: 6.6 10*3/uL (ref 1.7–7.7)
Neutrophils Relative %: 71 %
Platelet Count: 864 10*3/uL — ABNORMAL HIGH (ref 150–400)
RBC: 4.64 MIL/uL (ref 3.87–5.11)
RDW: 14.1 % (ref 11.5–15.5)
WBC Count: 9.3 10*3/uL (ref 4.0–10.5)
nRBC: 0 % (ref 0.0–0.2)

## 2022-07-07 LAB — IRON AND IRON BINDING CAPACITY (CC-WL,HP ONLY)
Iron: 170 ug/dL (ref 28–170)
Saturation Ratios: 56 % — ABNORMAL HIGH (ref 10.4–31.8)
TIBC: 302 ug/dL (ref 250–450)
UIBC: 132 ug/dL — ABNORMAL LOW (ref 148–442)

## 2022-07-07 LAB — FERRITIN: Ferritin: 34 ng/mL (ref 11–307)

## 2022-07-07 NOTE — Telephone Encounter (Signed)
Returned Pt's call regarding plt count and shingles. Pt concerned about elevated plt count and stated she fears she "will go blind" due to worsening shingles. Advised Pt that she has a phone visit with NP in the AM to discuss concerns, but if Pt is emergently worried she should seek assistance from Urgent Care. Pt verbalized understanding.

## 2022-07-08 ENCOUNTER — Inpatient Hospital Stay (HOSPITAL_BASED_OUTPATIENT_CLINIC_OR_DEPARTMENT_OTHER): Payer: Medicare Other | Admitting: Adult Health

## 2022-07-08 ENCOUNTER — Other Ambulatory Visit: Payer: Self-pay

## 2022-07-08 ENCOUNTER — Encounter: Payer: Self-pay | Admitting: *Deleted

## 2022-07-08 VITALS — BP 165/91 | HR 77 | Temp 97.8°F | Resp 16 | Ht 61.0 in | Wt 122.1 lb

## 2022-07-08 DIAGNOSIS — D473 Essential (hemorrhagic) thrombocythemia: Secondary | ICD-10-CM

## 2022-07-08 DIAGNOSIS — L03313 Cellulitis of chest wall: Secondary | ICD-10-CM

## 2022-07-08 DIAGNOSIS — B028 Zoster with other complications: Secondary | ICD-10-CM

## 2022-07-08 DIAGNOSIS — R21 Rash and other nonspecific skin eruption: Secondary | ICD-10-CM | POA: Diagnosis not present

## 2022-07-08 MED ORDER — GABAPENTIN 100 MG PO CAPS: 100.0000 mg | ORAL_CAPSULE | Freq: Every day | ORAL | 0 refills | Status: DC

## 2022-07-08 MED ORDER — DOXYCYCLINE HYCLATE 100 MG PO TABS: 100.0000 mg | ORAL_TABLET | Freq: Two times a day (BID) | ORAL | 0 refills | Status: DC

## 2022-07-08 NOTE — Progress Notes (Signed)
Per NP request, second opinion referral placed through Epic for [facility] for STAT referral as well as faxed STAT referral to [phone]) and [facility] [phone]) in hopes that one of the three locations will be able to see pt ASAP.  Pt notified of facilities and nurse provided pt with the numbers for pt to f/u on Monday to request STAT appts.  Per NP if these locations are unable to see pt, pt will need to f/u with the dermatologist she is already established with. Pt verbalized understanding.

## 2022-07-08 NOTE — Assessment & Plan Note (Addendum)
She is seen today for follow-up of her platelet count of 854.  She is concerned that her skin rashes potentially increasing her platelet level.  Wants to hold off on starting to [facility].  The skin rash is questionably infectious.  She needs a dermatology referral.  She met with Dr. Lindi Adie who also examined her.  We prescribed doxycycline twice daily for 1 week.  I also sent in gabapentin 100 mg nightly as needed if her nerve pain increases.  She will return in 1 week for labs and follow-up.

## 2022-07-08 NOTE — Progress Notes (Signed)
Referral orders have been put in for dermatology and patient is aware that the referral will be faxed to [facility] and [facility]. Also [place] [facility] has a referral as well. Phone numbers were given to patient to follow up on Monday about referrals. This LPN told patient that its possible they may not be able to fit her in soon and that she may have to set up an appointment with her current dermatologist. Patient verbalized understanding.

## 2022-07-08 NOTE — Progress Notes (Signed)
[facility] Follow up:    Pcp, No No address on file   DIAGNOSIS: Essential thrombocythemia; Hereditary Hemochromatosis   SUMMARY OF HEMATOLOGIC HISTORY: Hereditary hemachromatosis dates back to 2000.  She established with [facility] 05/2016 and underwent genetic testing revealing Hereditary hemachromatosis homozygous [id number] mutation. 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   JAK-2 [id number] POSITIVE Began ASA 81mg  daily on 05/22/2018. Began Anagrelide 1mg  daily 09/14/2018 Changed to Hydrea 12/20/2018 + aspirin 81mg  Discontinued Hydrea, started Anagrelide in 04/2021 daily Anagrelide increased to BID on 10/04/2021 11/26/2021 Anagrelide alternating with Hydrea due to palpitations 12/21/2021 Anagrelide discontinued due to palpitations, Hydrea restarted 03/07/2022--Hydrea discontinued related to profound hair loss, she wants to explore alternative options to Anagrelide and Hydrea) Patient saw Dr. Almyra Free at [facility] who recommended considering treatment with Jakafi 5 mg p.o. twice daily.  CURRENT THERAPY: observation  INTERVAL HISTORY: Kara [name] 72 y.o. female returns for f/u about her labs and shingles.  We had a phone call earlier in the day however it was necessary to do a physical exam while we talk to her and gathered her history due to the complexity of her issues.  She was diagnosed with shingles when she visited [facility] on February 11 due to a rash.  That rash is slightly progressed and it has now cover both sides of her chest and also she notes 2 new areas under her chin.  She is having some nerve pain under her right arm and is also having some neck pain.  Of note she did have a facelift with Dr. Sabra Heck at [facility]  and has struggled with some scabbing at her surgical incision site since that time.  Her platelets have been elevated most recently 854.  She was seen at [place] was recommended however she did not want to start it yet.  Denies any fevers or chills.   Patient Active Problem List   Diagnosis Date Noted   Essential thrombocytosis ([place]) 05/22/2018   Chronic migraine 11/21/2017   Hereditary hemochromatosis ([place]) 06/01/2016   Benign paroxysmal positional vertigo 05/20/2016   Orthostatic hypotension 05/20/2016   Nasal obstruction 05/19/2016   Light headedness 04/28/2016   Screening for hyperlipidemia 04/28/2016    is allergic to penicillins and tramadol.  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   Highest level education:  Masters   Right-handed.   Caffeine use: 4 cups per day.   Social Determinants of Health   Financial Resource Strain: Not on file  Food Insecurity: Not on file  Transportation Needs: Not on file  Physical Activity: Not on file  Stress: Not on file  Social Connections: Not on file  Intimate Partner Violence: Not on  file    FAMILY HISTORY: Family History  Problem Relation Age of Onset   Healthy Mother    Acute myelogenous leukemia Father    Bipolar disorder Sister    Diabetes Neg Hx    Cancer Neg Hx    Heart failure Neg Hx    Hyperlipidemia Neg Hx    Hypertension Neg Hx     Review of Systems  Constitutional:  Positive for fatigue. Negative for appetite change, chills, fever and unexpected weight change.  HENT:   Negative for hearing loss, lump/mass and trouble swallowing.   Eyes:  Negative for eye problems and icterus.  Respiratory:  Negative for chest tightness, cough and shortness of breath.   Cardiovascular:  Negative for chest pain, leg swelling and palpitations.  Gastrointestinal:  Negative for abdominal distention, abdominal pain, constipation, diarrhea, nausea and vomiting.  Endocrine: Negative for hot flashes.  Genitourinary:  Negative for difficulty urinating.   Musculoskeletal:  Negative for arthralgias.  Skin:  Negative for itching and rash.  Neurological:  Negative for dizziness, extremity weakness, headaches and numbness.  Hematological:  Negative for adenopathy. Does not bruise/bleed easily.  Psychiatric/Behavioral:  Negative for depression. The patient is not nervous/anxious.       PHYSICAL EXAMINATION  ECOG PERFORMANCE STATUS: 1 - Symptomatic but completely ambulatory  Vitals:   07/08/22 1153  BP: (!) 165/91  Pulse: 77  Resp: 16  Temp: 97.8 F (36.6 C)  SpO2: 98%    Physical Exam Constitutional:      General: She is not in acute distress.    Appearance: Normal appearance. She is not toxic-appearing.  HENT:     Head: Normocephalic and atraumatic.  Eyes:     General: No scleral icterus. Cardiovascular:     Rate and Rhythm: Normal rate and regular rhythm.     Pulses: Normal pulses.     Heart sounds: Normal heart sounds.  Pulmonary:     Effort: Pulmonary effort is normal.     Breath sounds: Normal breath sounds.  Chest:     Comments: Erythematous  nonvesicular rash noted scattered across 2 dermatomes on right chest wall also crossing into the left chest wall. Abdominal:     General: Abdomen is flat. Bowel sounds are normal. There is no distension.     Palpations: Abdomen is soft.     Tenderness: There is no abdominal tenderness.  Musculoskeletal:        General: No swelling.     Cervical back: Neck supple.  Lymphadenopathy:     Cervical: No cervical adenopathy.  Skin:    General: Skin is warm and dry.     Findings: No rash.  Neurological:     General: No focal deficit present.     Mental Status: She is alert.  Psychiatric:        Mood and Affect: Mood normal.        Behavior: Behavior normal.     LABORATORY DATA:  CBC    Component Value Date/Time   WBC 9.3 07/07/2022 1300   WBC 15.4 (H) 11/04/2019 1325  RBC 4.64 07/07/2022 1300   HGB 14.6 07/07/2022 1300   HGB 13.4 03/23/2017 1439   HCT 43.5 07/07/2022 1300   HCT 40.7 03/23/2017 1439   PLT 864 (H) 07/07/2022 1300   PLT 606 (H) 03/23/2017 1439   MCV 93.8 07/07/2022 1300   MCV 77.8 (L) 03/23/2017 1439   MCH 31.5 07/07/2022 1300   MCHC 33.6 07/07/2022 1300   RDW 14.1 07/07/2022 1300   RDW 23.1 (H) 03/23/2017 1439   LYMPHSABS 1.9 07/07/2022 1300   LYMPHSABS 2.9 03/23/2017 1439   MONOABS 0.5 07/07/2022 1300   MONOABS 0.8 03/23/2017 1439   EOSABS 0.3 07/07/2022 1300   EOSABS 0.3 03/23/2017 1439   BASOSABS 0.0 07/07/2022 1300   BASOSABS 0.1 03/23/2017 1439    CMP     Component Value Date/Time   NA 142 12/16/2021 1430   K 4.9 12/16/2021 1430   CL 106 12/16/2021 1430   CO2 21 12/16/2021 1430   GLUCOSE 100 (H) 12/16/2021 1430   GLUCOSE 85 11/02/2021 1548   BUN 13 12/16/2021 1430   CREATININE 0.57 12/16/2021 1430   CREATININE 0.44 11/02/2021 1548   CALCIUM 9.6 12/16/2021 1430   PROT 6.9 12/16/2021 1430   ALBUMIN 4.8 12/16/2021 1430   AST 17 12/16/2021 1430   AST 16 11/02/2021 1548   ALT 10 12/16/2021 1430   ALT 12 11/02/2021 1548   ALKPHOS 69  12/16/2021 1430   BILITOT 0.2 12/16/2021 1430   BILITOT 0.4 11/02/2021 1548   GFRNONAA >60 11/02/2021 1548   GFRAA >60 11/04/2019 1325   GFRAA >60 11/04/2019 0816         ASSESSMENT and THERAPY PLAN:   Essential thrombocytosis ([place]) She is seen today for follow-up of her platelet count of 854.  She is concerned that her skin rashes potentially increasing her platelet level.  Wants to hold off on starting to [facility].  The skin rash is questionably infectious.  She needs a dermatology referral.  She met with Dr. Lindi Adie who also examined her.  We prescribed doxycycline twice daily for 1 week.  I also sent in gabapentin 100 mg nightly as needed if her nerve pain increases.  She will return in 1 week for labs and follow-up.   All questions were answered. The patient knows to call the clinic with any problems, questions or concerns. We can certainly see the patient much sooner if necessary.  Total encounter time:30 minutes*in face-to-face visit time, chart review, lab review, care coordination, order entry, and documentation of the encounter time.    Wilber Bihari, NP 07/08/22 3:18 PM Medical Oncology and Hematology [facility], [place] [address] Tel. [phone]    Fax. [phone]  *Total Encounter Time as defined by the Centers for Medicare and Medicaid Services includes, in addition to the face-to-face time of a patient visit (documented in the note above) non-face-to-face time: obtaining and reviewing outside history, ordering and reviewing medications, tests or procedures, care coordination (communications with other health care professionals or caregivers) and documentation in the medical record.

## 2022-07-11 ENCOUNTER — Encounter: Payer: Self-pay | Admitting: *Deleted

## 2022-07-11 ENCOUNTER — Telehealth: Payer: Self-pay

## 2022-07-11 ENCOUNTER — Telehealth: Payer: Self-pay | Admitting: Adult Health

## 2022-07-11 NOTE — Telephone Encounter (Signed)
Called pt to advise we received notice from [facility] regarding referral. They are unable to take new pts at this time unless skin cancer is indicated. Called pt to make her aware and she verbalized she has an upcoming appt with Lyndle Herrlich. She knows to call with any further concerns.

## 2022-07-11 NOTE — Progress Notes (Signed)
Received fax from [facility] stating they do not treat patients for shingles.  Pt notified that she needs to f/u with [facility] or [facility] for appt, pt verbalized understanding.

## 2022-07-11 NOTE — Telephone Encounter (Signed)
Scheduled appointment per 3/22 los. Patient is aware of the made appointments.

## 2022-07-12 DIAGNOSIS — L309 Dermatitis, unspecified: Secondary | ICD-10-CM | POA: Diagnosis not present

## 2022-07-12 DIAGNOSIS — R202 Paresthesia of skin: Secondary | ICD-10-CM | POA: Diagnosis not present

## 2022-07-12 DIAGNOSIS — L989 Disorder of the skin and subcutaneous tissue, unspecified: Secondary | ICD-10-CM | POA: Diagnosis not present

## 2022-07-13 NOTE — Progress Notes (Signed)
Patient Care Team: Pcp, No as PCP - General Nicholas Lose, MD as Consulting Physician (Hematology and Oncology)  DIAGNOSIS: No diagnosis found.  SUMMARY OF ONCOLOGIC HISTORY: Oncology History   No history exists.    CHIEF COMPLIANT: thrombocytosis and hereditary hemochromatosis.  INTERVAL HISTORY: Kara [name] is a 72 y.o. female with above-mentioned history of thrombocytosis and hereditary hemochromatosis. She presents to the clinic for a follow-up.   ALLERGIES:  is allergic to penicillins and tramadol.  MEDICATIONS:  Current Outpatient Medications  Medication Sig Dispense Refill   ACETAMINOPHEN PO Take 250 mg by mouth 4 (four) times daily as needed (pain).     aspirin EC 81 MG tablet Take 1 tablet (81 mg total) by mouth daily.     doxycycline (VIBRA-TABS) 100 MG tablet Take 1 tablet (100 mg total) by mouth 2 (two) times daily. 14 tablet 0   gabapentin (NEURONTIN) 100 MG capsule Take 1 capsule (100 mg total) by mouth at bedtime. 30 capsule 0   ibuprofen (ADVIL) 800 MG tablet Take 800 mg by mouth as needed.     No current facility-administered medications for this visit.    PHYSICAL EXAMINATION: ECOG PERFORMANCE STATUS: {CHL ONC ECOG PS:[id number]}  There were no vitals filed for this visit. There were no vitals filed for this visit.  BREAST:*** No palpable masses or nodules in either right or left breasts. No palpable axillary supraclavicular or infraclavicular adenopathy no breast tenderness or nipple discharge. (exam performed in the presence of a chaperone)  LABORATORY DATA:  I have reviewed the data as listed    Latest Ref Rng & Units 12/16/2021    2:30 PM 11/02/2021    3:48 PM 10/01/2021    8:44 AM  CMP  Glucose 70 - 99 mg/dL 100  85  99   BUN 8 - 27 mg/dL 13  15  15    Creatinine 0.57 - 1.00 mg/dL 0.57  0.44  0.61   Sodium 134 - 144 mmol/L 142  140  134   Potassium 3.5 - 5.2 mmol/L 4.9  4.2  4.3   Chloride 96 - 106 mmol/L 106  110  103   CO2 20 - 29 mmol/L 21   21  23    Calcium 8.7 - 10.3 mg/dL 9.6  9.1  10.0   Total Protein 6.0 - 8.5 g/dL 6.9  7.2  7.1   Total Bilirubin 0.0 - 1.2 mg/dL 0.2  0.4  0.4   Alkaline Phos 44 - 121 IU/L 69  63  52   AST 0 - 40 IU/L 17  16  19    ALT 0 - 32 IU/L 10  12  10      Lab Results  Component Value Date   WBC 9.3 07/07/2022   HGB 14.6 07/07/2022   HCT 43.5 07/07/2022   MCV 93.8 07/07/2022   PLT 864 (H) 07/07/2022   NEUTROABS 6.6 07/07/2022    ASSESSMENT & PLAN:  No problem-specific Assessment & Plan notes found for this encounter.    No orders of the defined types were placed in this encounter.  The patient has a good understanding of the overall plan. she agrees with it. she will call with any problems that may develop before the next visit here. Total time spent: 30 mins including face to face time and time spent for planning, charting and co-ordination of care   Suzzette Righter, [place] 07/13/22    I Gardiner Coins am acting as a Education administrator for [employer]  ***

## 2022-07-14 ENCOUNTER — Other Ambulatory Visit: Payer: Self-pay | Admitting: *Deleted

## 2022-07-14 DIAGNOSIS — R897 Abnormal histological findings in specimens from other organs, systems and tissues: Secondary | ICD-10-CM | POA: Diagnosis not present

## 2022-07-14 DIAGNOSIS — D473 Essential (hemorrhagic) thrombocythemia: Secondary | ICD-10-CM

## 2022-07-15 ENCOUNTER — Other Ambulatory Visit: Payer: Self-pay

## 2022-07-15 ENCOUNTER — Inpatient Hospital Stay: Payer: Medicare Other

## 2022-07-15 DIAGNOSIS — R21 Rash and other nonspecific skin eruption: Secondary | ICD-10-CM | POA: Diagnosis not present

## 2022-07-15 DIAGNOSIS — D473 Essential (hemorrhagic) thrombocythemia: Secondary | ICD-10-CM

## 2022-07-15 LAB — CBC WITH DIFFERENTIAL (CANCER CENTER ONLY)
Abs Immature Granulocytes: 0.03 10*3/uL (ref 0.00–0.07)
Basophils Absolute: 0 10*3/uL (ref 0.0–0.1)
Basophils Relative: 1 %
Eosinophils Absolute: 0.2 10*3/uL (ref 0.0–0.5)
Eosinophils Relative: 3 %
HCT: 39.9 % (ref 36.0–46.0)
Hemoglobin: 13.6 g/dL (ref 12.0–15.0)
Immature Granulocytes: 0 %
Lymphocytes Relative: 24 %
Lymphs Abs: 2 10*3/uL (ref 0.7–4.0)
MCH: 31.6 pg (ref 26.0–34.0)
MCHC: 34.1 g/dL (ref 30.0–36.0)
MCV: 92.6 fL (ref 80.0–100.0)
Monocytes Absolute: 0.6 10*3/uL (ref 0.1–1.0)
Monocytes Relative: 7 %
Neutro Abs: 5.2 10*3/uL (ref 1.7–7.7)
Neutrophils Relative %: 65 %
Platelet Count: 735 10*3/uL — ABNORMAL HIGH (ref 150–400)
RBC: 4.31 MIL/uL (ref 3.87–5.11)
RDW: 14.1 % (ref 11.5–15.5)
WBC Count: 8.1 10*3/uL (ref 4.0–10.5)
nRBC: 0 % (ref 0.0–0.2)

## 2022-07-15 LAB — CMP (CANCER CENTER ONLY)
ALT: 9 U/L (ref 0–44)
AST: 16 U/L (ref 15–41)
Albumin: 4.5 g/dL (ref 3.5–5.0)
Alkaline Phosphatase: 64 U/L (ref 38–126)
Anion gap: 7 (ref 5–15)
BUN: 12 mg/dL (ref 8–23)
CO2: 25 mmol/L (ref 22–32)
Calcium: 9.4 mg/dL (ref 8.9–10.3)
Chloride: 103 mmol/L (ref 98–111)
Creatinine: 0.58 mg/dL (ref 0.44–1.00)
GFR, Estimated: >60 mL/min (ref >60)
Glucose, Bld: 94 mg/dL (ref 70–99)
Potassium: 4.1 mmol/L (ref 3.5–5.1)
Sodium: 135 mmol/L (ref 135–145)
Total Bilirubin: 0.4 mg/dL (ref 0.3–1.2)
Total Protein: 6.9 g/dL (ref 6.5–8.1)

## 2022-07-18 ENCOUNTER — Telehealth: Payer: Self-pay

## 2022-07-18 DIAGNOSIS — D473 Essential (hemorrhagic) thrombocythemia: Secondary | ICD-10-CM

## 2022-07-18 NOTE — Telephone Encounter (Signed)
Returned Pt's call regarding upcoming appt. Pt asking if Dr. Lindi Adie has any sooner appts and if she can add another lab appt. Pt states her concerns are not urgent and that she "just wanted to check". Advised Pt to keep 04/04 appt as MD schedule is booked until then. Lab appt made, Pt verbalized understanding.

## 2022-07-19 ENCOUNTER — Other Ambulatory Visit: Payer: Medicare Other

## 2022-07-21 ENCOUNTER — Inpatient Hospital Stay: Payer: Medicare Other | Attending: Hematology and Oncology | Admitting: Hematology and Oncology

## 2022-07-21 ENCOUNTER — Other Ambulatory Visit: Payer: Self-pay

## 2022-07-21 VITALS — BP 175/84 | HR 71 | Temp 97.7°F | Resp 18 | Ht 61.0 in | Wt 125.3 lb

## 2022-07-21 DIAGNOSIS — D75839 Thrombocytosis, unspecified: Secondary | ICD-10-CM | POA: Diagnosis not present

## 2022-07-21 DIAGNOSIS — D473 Essential (hemorrhagic) thrombocythemia: Secondary | ICD-10-CM | POA: Insufficient documentation

## 2022-07-21 DIAGNOSIS — R21 Rash and other nonspecific skin eruption: Secondary | ICD-10-CM | POA: Insufficient documentation

## 2022-07-21 NOTE — Assessment & Plan Note (Signed)
Lab: 06/18/21:  Hb 11.2, Platelet 778 07/30/2021: Hemoglobin 12.9, platelets 783, iron saturation 13%, ferritin 7  10/01/2021: Hemoglobin 14.1, platelets 828, ferritin 22 11/02/2021: Hemoglobin 12.9, platelets 435, ferritin 22, iron saturation 54%  10/04/21: Increased anagrelide to Bid 11/26/21: Started alternating Anagrelide with Hydrea  12/16/2021: Hemoglobin 12.9, platelets 632  (discontinued anagrelide, Hydrea 500 mg twice daily) 12/31/2021: Hemoglobin 12.9, platelets 641 03/22/22: Platelets 552 04/06/2022: Platelets 763 05/20/2022: Platelets 782, hemoglobin 13.2, iron saturation 64%, ferritin 42, folate 13.3, B12 222 06/09/22: platelets 826 (shingles) 06/21/2022: Platelets 701, iron saturation 66%, ferritin 31 07/07/2022: Platelets 664, ferritin 34, iron saturation 56%, hemoglobin 14.6   Severe palpitations: Related to anagrelide. D/C anagrelide and her palpitations have mostly resolved.   Plan:  Hydroxyurea 500 mg (being held because of hair loss)   Facial Plastic surgery (Dr.Miller): Nov 20th 2023 Skin rash: Previously treated as shingles with acyclovir.  We suspected that it may not be shingles and gave her doxycycline last week. Patient saw Dr. Almyra Free at [facility] who recommended considering treatment with Jakafi 5 mg p.o. twice daily.   Recheck labs in 1 month

## 2022-07-25 ENCOUNTER — Encounter: Payer: Self-pay | Admitting: Hematology and Oncology

## 2022-07-28 DIAGNOSIS — H04123 Dry eye syndrome of bilateral lacrimal glands: Secondary | ICD-10-CM | POA: Diagnosis not present

## 2022-07-28 DIAGNOSIS — R519 Headache, unspecified: Secondary | ICD-10-CM | POA: Diagnosis not present

## 2022-07-28 DIAGNOSIS — H0102B Squamous blepharitis left eye, upper and lower eyelids: Secondary | ICD-10-CM | POA: Diagnosis not present

## 2022-07-28 DIAGNOSIS — H0102A Squamous blepharitis right eye, upper and lower eyelids: Secondary | ICD-10-CM | POA: Diagnosis not present

## 2022-07-28 DIAGNOSIS — H2513 Age-related nuclear cataract, bilateral: Secondary | ICD-10-CM | POA: Diagnosis not present

## 2022-07-28 DIAGNOSIS — H43811 Vitreous degeneration, right eye: Secondary | ICD-10-CM | POA: Diagnosis not present

## 2022-08-08 DIAGNOSIS — L239 Allergic contact dermatitis, unspecified cause: Secondary | ICD-10-CM | POA: Diagnosis not present

## 2022-08-08 DIAGNOSIS — R202 Paresthesia of skin: Secondary | ICD-10-CM | POA: Diagnosis not present

## 2022-08-15 ENCOUNTER — Other Ambulatory Visit: Payer: Self-pay

## 2022-08-15 ENCOUNTER — Telehealth: Payer: Self-pay | Admitting: Hematology and Oncology

## 2022-08-15 ENCOUNTER — Inpatient Hospital Stay: Payer: Medicare Other

## 2022-08-15 DIAGNOSIS — D473 Essential (hemorrhagic) thrombocythemia: Secondary | ICD-10-CM | POA: Diagnosis not present

## 2022-08-15 DIAGNOSIS — R21 Rash and other nonspecific skin eruption: Secondary | ICD-10-CM | POA: Diagnosis not present

## 2022-08-15 DIAGNOSIS — D75839 Thrombocytosis, unspecified: Secondary | ICD-10-CM | POA: Diagnosis not present

## 2022-08-15 LAB — CBC WITH DIFFERENTIAL (CANCER CENTER ONLY)
Abs Immature Granulocytes: 0.03 10*3/uL (ref 0.00–0.07)
Basophils Absolute: 0.1 10*3/uL (ref 0.0–0.1)
Basophils Relative: 1 %
Eosinophils Absolute: 0.5 10*3/uL (ref 0.0–0.5)
Eosinophils Relative: 5 %
HCT: 40.2 % (ref 36.0–46.0)
Hemoglobin: 13.8 g/dL (ref 12.0–15.0)
Immature Granulocytes: 0 %
Lymphocytes Relative: 25 %
Lymphs Abs: 2.6 10*3/uL (ref 0.7–4.0)
MCH: 31 pg (ref 26.0–34.0)
MCHC: 34.3 g/dL (ref 30.0–36.0)
MCV: 90.3 fL (ref 80.0–100.0)
Monocytes Absolute: 0.8 10*3/uL (ref 0.1–1.0)
Monocytes Relative: 7 %
Neutro Abs: 6.3 10*3/uL (ref 1.7–7.7)
Neutrophils Relative %: 62 %
Platelet Count: 857 10*3/uL — ABNORMAL HIGH (ref 150–400)
RBC: 4.45 MIL/uL (ref 3.87–5.11)
RDW: 14.8 % (ref 11.5–15.5)
WBC Count: 10.2 10*3/uL (ref 4.0–10.5)
nRBC: 0 % (ref 0.0–0.2)

## 2022-08-15 LAB — IRON AND IRON BINDING CAPACITY (CC-WL,HP ONLY)
Iron: 143 ug/dL (ref 28–170)
Saturation Ratios: 47 % — ABNORMAL HIGH (ref 10.4–31.8)
TIBC: 302 ug/dL (ref 250–450)
UIBC: 159 ug/dL

## 2022-08-15 NOTE — Telephone Encounter (Signed)
patient called to get her lab appointment moved to today.

## 2022-08-16 LAB — FERRITIN: Ferritin: 31 ng/mL (ref 11–307)

## 2022-08-17 NOTE — Progress Notes (Signed)
Patient Care Team: Pcp, No as PCP - General Serena Croissant, MD as Consulting Physician (Hematology and Oncology)  DIAGNOSIS:  Encounter Diagnosis  Name Primary?   Essential thrombocytosis (HCC) Yes    CHIEF COMPLIANT: thrombocytosis and hereditary hemochromatosis./ Rash    INTERVAL HISTORY: Kara [name] is a 72 y.o. female with above-mentioned history of thrombocytosis and hereditary hemochromatosis. She presents to the clinic for a follow-up. She reports that the rash is still the same. She says it is starting to form blisters. She says at the begging of the week it was fairly clear. She is using some topical cream. She says it is painful but pressure takes the pain away. It is better in the mornings. The sports bra doesn't give it no relief. She is exhausted and very irritated from the rash. She does have some loose stools but no diarrhea.  ALLERGIES:  is allergic to penicillins and tramadol.  MEDICATIONS:  Current Outpatient Medications  Medication Sig Dispense Refill   ACETAMINOPHEN PO Take 250 mg by mouth 4 (four) times daily as needed (pain).     aspirin EC 81 MG tablet Take 1 tablet (81 mg total) by mouth daily.     ibuprofen (ADVIL) 800 MG tablet Take 800 mg by mouth as needed.     valACYclovir (VALTREX) 500 MG tablet Take 1 tablet PO BID for 10 days.     No current facility-administered medications for this visit.    PHYSICAL EXAMINATION: ECOG PERFORMANCE STATUS: 1 - Symptomatic but completely ambulatory  Vitals:   08/18/22 1121  BP: (!) 143/76  Pulse: 75  Resp: 18  Temp: (!) 97.2 F (36.2 C)  SpO2: 96%   Filed Weights   08/18/22 1121  Weight: 124 lb 11.2 oz (56.6 kg)     LABORATORY DATA:  I have reviewed the data as listed    Latest Ref Rng & Units 07/15/2022   12:19 PM 12/16/2021    2:30 PM 11/02/2021    3:48 PM  CMP  Glucose 70 - 99 mg/dL 94  161  85   BUN 8 - 23 mg/dL 12  13  15    Creatinine 0.44 - 1.00 mg/dL 0.96  0.45  4.09   Sodium 135 - 145  mmol/L 135  142  140   Potassium 3.5 - 5.1 mmol/L 4.1  4.9  4.2   Chloride 98 - 111 mmol/L 103  106  110   CO2 22 - 32 mmol/L 25  21  21    Calcium 8.9 - 10.3 mg/dL 9.4  9.6  9.1   Total Protein 6.5 - 8.1 g/dL 6.9  6.9  7.2   Total Bilirubin 0.3 - 1.2 mg/dL 0.4  0.2  0.4   Alkaline Phos 38 - 126 U/L 64  69  63   AST 15 - 41 U/L 16  17  16    ALT 0 - 44 U/L 9  10  12      Lab Results  Component Value Date   WBC 10.2 08/15/2022   HGB 13.8 08/15/2022   HCT 40.2 08/15/2022   MCV 90.3 08/15/2022   PLT 857 (H) 08/15/2022   NEUTROABS 6.3 08/15/2022    ASSESSMENT & PLAN:  Essential thrombocytosis (HCC) Lab: 06/18/21:  Hb 11.2, Platelet 778 07/30/2021: Hemoglobin 12.9, platelets 783, iron saturation 13%, ferritin 7  10/01/2021: Hemoglobin 14.1, platelets 828, ferritin 22 11/02/2021: Hemoglobin 12.9, platelets 435, ferritin 22, iron saturation 54%  04/06/2022: Platelets 763 05/20/2022: Platelets 782, hemoglobin 13.2, iron saturation  64%, ferritin 42, folate 13.3, B12 222 06/09/22: platelets 826 (shingles) 06/21/2022: Platelets 701, iron saturation 66%, ferritin 31 07/07/2022: Platelets 664, ferritin 34, iron saturation 56%, hemoglobin 14.6 08/15/2022: Platelets 857   Severe palpitations: Related to anagrelide. D/C anagrelide and her palpitations have mostly resolved.   Plan:  Hydroxyurea 500 mg (being held because of hair loss)   Facial Plastic surgery (Dr.Miller): Nov 20th 2023 Skin rash: Previously treated as shingles with acyclovir. Patient saw dermatology and they performed biopsies.  She was started on Valtrex Patient saw Dr. Audley Hose at [facility] who recommended considering treatment with Jakafi 5 mg p.o. twice daily.   Neuropathic pain on the chest wall: Discontinue gabapentin. Recheck labs in 1 month and follow-up after that    Orders Placed This Encounter  Procedures   ANA, IFA (with reflex)    Standing Status:   Future    Standing Expiration Date:   08/18/2023   Sjogren's syndrome  antibods(ssa + ssb)   Rheumatoid factor    Standing Status:   Future    Standing Expiration Date:   08/18/2023   Multiple Myeloma Panel (SPEP&IFE w/QIG)    Standing Status:   Future    Standing Expiration Date:   08/18/2023   Kappa/lambda light chains    Standing Status:   Future    Standing Expiration Date:   08/18/2023   CBC with Differential (Cancer Center Only)    Standing Status:   Future    Standing Expiration Date:   08/18/2023   The patient has a good understanding of the overall plan. she agrees with it. she will call with any problems that may develop before the next visit here. Total time spent: 30 mins including face to face time and time spent for planning, charting and co-ordination of care   Tamsen Meek, MD 08/18/22    I Janan Ridge am acting as a Neurosurgeon for [employer]  I have reviewed the above documentation for accuracy and completeness, and I agree with the above.

## 2022-08-18 ENCOUNTER — Other Ambulatory Visit: Payer: Self-pay

## 2022-08-18 ENCOUNTER — Other Ambulatory Visit: Payer: Self-pay | Admitting: *Deleted

## 2022-08-18 ENCOUNTER — Other Ambulatory Visit: Payer: Medicare Other

## 2022-08-18 ENCOUNTER — Inpatient Hospital Stay: Payer: Medicare Other | Attending: Hematology and Oncology | Admitting: Hematology and Oncology

## 2022-08-18 VITALS — BP 143/76 | HR 75 | Temp 97.2°F | Resp 18 | Ht 61.0 in | Wt 124.7 lb

## 2022-08-18 DIAGNOSIS — R21 Rash and other nonspecific skin eruption: Secondary | ICD-10-CM | POA: Diagnosis not present

## 2022-08-18 DIAGNOSIS — D75839 Thrombocytosis, unspecified: Secondary | ICD-10-CM | POA: Insufficient documentation

## 2022-08-18 DIAGNOSIS — D473 Essential (hemorrhagic) thrombocythemia: Secondary | ICD-10-CM | POA: Diagnosis not present

## 2022-08-18 DIAGNOSIS — R0789 Other chest pain: Secondary | ICD-10-CM | POA: Diagnosis not present

## 2022-08-18 DIAGNOSIS — M792 Neuralgia and neuritis, unspecified: Secondary | ICD-10-CM | POA: Insufficient documentation

## 2022-08-18 NOTE — Assessment & Plan Note (Signed)
Lab: 06/18/21:  Hb 11.2, Platelet 778 07/30/2021: Hemoglobin 12.9, platelets 783, iron saturation 13%, ferritin 7  10/01/2021: Hemoglobin 14.1, platelets 828, ferritin 22 11/02/2021: Hemoglobin 12.9, platelets 435, ferritin 22, iron saturation 54%  10/04/21: Increased anagrelide to Bid 11/26/21: Started alternating Anagrelide with Hydrea  12/16/2021: Hemoglobin 12.9, platelets 632  (discontinued anagrelide, Hydrea 500 mg twice daily) 12/31/2021: Hemoglobin 12.9, platelets 641 03/22/22: Platelets 552 04/06/2022: Platelets 763 05/20/2022: Platelets 782, hemoglobin 13.2, iron saturation 64%, ferritin 42, folate 13.3, B12 222 06/09/22: platelets 826 (shingles) 06/21/2022: Platelets 701, iron saturation 66%, ferritin 31 07/07/2022: Platelets 664, ferritin 34, iron saturation 56%, hemoglobin 14.6 07/15/2022: Platelets 735   Severe palpitations: Related to anagrelide. D/C anagrelide and her palpitations have mostly resolved.   Plan:  Hydroxyurea 500 mg (being held because of hair loss)   Facial Plastic surgery (Dr.Miller): Nov 20th 2023 Skin rash: Previously treated as shingles with acyclovir. Patient saw dermatology and they performed biopsies.  We do not have the pathology report.    Patient saw Dr. Audley Hose at [facility] who recommended considering treatment with Jakafi 5 mg p.o. twice daily.   Neuropathic pain on the chest wall: Currently on gabapentin. Recheck labs in 1 month and follow-up after that

## 2022-08-23 ENCOUNTER — Telehealth (HOSPITAL_COMMUNITY): Payer: Self-pay

## 2022-08-23 ENCOUNTER — Telehealth: Payer: Self-pay | Admitting: *Deleted

## 2022-08-23 NOTE — Telephone Encounter (Signed)
Patient called very upset at trying to find answers for her current symptoms. She is reaching out to [place] to make sure it is not a cardiac symptom in addition to her palpitations. She has seen multiple doctors for this rash she has developed. It started on her chest and has spread. She does have palpitations at times, but no other symptoms related to that. She does not have a PCP currently and I encouraged her to reach out to find one asap. She knows we are concentrated on heart failure, but she asked if you could review her notes from other doctors and see if you had any suggestions for her.

## 2022-08-23 NOTE — Telephone Encounter (Signed)
Received call from pt stating the the rash on her chest is getting worse and continues to expand.  Pt requesting to have lab work drawn sooner than the end of the month. RN reviewed with MD who verbalized okay to obtain lab work sooner.  Updated message sent to scheduling team.

## 2022-08-24 ENCOUNTER — Telehealth: Payer: Self-pay | Admitting: Hematology and Oncology

## 2022-08-24 NOTE — Telephone Encounter (Signed)
Rescheduled appointments per 5/7 staff message. Patient is aware of the changes made to her upcoming appointments.

## 2022-08-25 ENCOUNTER — Encounter (HOSPITAL_COMMUNITY): Payer: Self-pay

## 2022-08-26 ENCOUNTER — Inpatient Hospital Stay: Payer: Medicare Other

## 2022-08-26 ENCOUNTER — Other Ambulatory Visit: Payer: Self-pay

## 2022-08-26 ENCOUNTER — Ambulatory Visit: Payer: Medicare Other | Admitting: Family

## 2022-08-26 DIAGNOSIS — R21 Rash and other nonspecific skin eruption: Secondary | ICD-10-CM | POA: Diagnosis not present

## 2022-08-26 DIAGNOSIS — R0789 Other chest pain: Secondary | ICD-10-CM | POA: Diagnosis not present

## 2022-08-26 DIAGNOSIS — D473 Essential (hemorrhagic) thrombocythemia: Secondary | ICD-10-CM

## 2022-08-26 DIAGNOSIS — D75839 Thrombocytosis, unspecified: Secondary | ICD-10-CM | POA: Diagnosis not present

## 2022-08-26 DIAGNOSIS — M792 Neuralgia and neuritis, unspecified: Secondary | ICD-10-CM | POA: Diagnosis not present

## 2022-08-26 LAB — CBC WITH DIFFERENTIAL (CANCER CENTER ONLY)
Abs Immature Granulocytes: 0.04 10*3/uL (ref 0.00–0.07)
Basophils Absolute: 0.1 10*3/uL (ref 0.0–0.1)
Basophils Relative: 1 %
Eosinophils Absolute: 0.7 10*3/uL — ABNORMAL HIGH (ref 0.0–0.5)
Eosinophils Relative: 6 %
HCT: 40 % (ref 36.0–46.0)
Hemoglobin: 13.4 g/dL (ref 12.0–15.0)
Immature Granulocytes: 0 %
Lymphocytes Relative: 24 %
Lymphs Abs: 2.6 10*3/uL (ref 0.7–4.0)
MCH: 30.7 pg (ref 26.0–34.0)
MCHC: 33.5 g/dL (ref 30.0–36.0)
MCV: 91.7 fL (ref 80.0–100.0)
Monocytes Absolute: 1 10*3/uL (ref 0.1–1.0)
Monocytes Relative: 9 %
Neutro Abs: 6.4 10*3/uL (ref 1.7–7.7)
Neutrophils Relative %: 60 %
Platelet Count: 938 10*3/uL (ref 150–400)
RBC: 4.36 MIL/uL (ref 3.87–5.11)
RDW: 15.2 % (ref 11.5–15.5)
WBC Count: 10.6 10*3/uL — ABNORMAL HIGH (ref 4.0–10.5)
nRBC: 0 % (ref 0.0–0.2)

## 2022-08-28 LAB — RHEUMATOID FACTOR: Rheumatoid fact SerPl-aCnc: 10 IU/mL (ref <14.0)

## 2022-08-28 LAB — SJOGREN'S SYNDROME ANTIBODS(SSA + SSB): SSB (La) (ENA) Antibody, IgG: <0.2 AI (ref 0.0–0.9)

## 2022-08-29 LAB — KAPPA/LAMBDA LIGHT CHAINS: Kappa, lambda light chain ratio: 1.32 (ref 0.26–1.65)

## 2022-08-31 LAB — SJOGREN'S SYNDROME ANTIBODS(SSA + SSB): SSA (Ro) (ENA) Antibody, IgG: <0.2 AI (ref 0.0–0.9)

## 2022-08-31 LAB — KAPPA/LAMBDA LIGHT CHAINS
Kappa free light chain: 13.5 mg/L (ref 3.3–19.4)
Lambda free light chains: 10.2 mg/L (ref 5.7–26.3)

## 2022-08-31 LAB — ANTINUCLEAR ANTIBODIES, IFA: ANA Ab, IFA: NEGATIVE

## 2022-08-31 NOTE — Progress Notes (Signed)
HEMATOLOGY-ONCOLOGY TELEPHONE VISIT PROGRESS NOTE  I connected with our patient on 09/02/22 at 11:30 AM EDT by telephone and verified that I am speaking with the correct person using two identifiers.  I discussed the limitations, risks, security and privacy concerns of performing an evaluation and management service by telephone and the availability of in person appointments.  I also discussed with the patient that there may be a patient responsible charge related to this service. The patient expressed understanding and agreed to proceed.   History of Present Illness:  Kara [name] is a 72 y.o. female with a history of thrombocytosis and hereditary hemochromatosis. She presents to the clinic for a telephone follow-up.  She continues to have the chest wall rash with some neuropathic pain.  REVIEW OF SYSTEMS:   Constitutional: Denies fevers, chills or abnormal weight loss All other systems were reviewed with the patient and are negative. Observations/Objective:     Assessment Plan:  Essential thrombocytosis (HCC) Lab: 06/18/21:  Hb 11.2, Platelet 778 07/30/2021: Hemoglobin 12.9, platelets 783, iron saturation 13%, ferritin 7  10/01/2021: Hemoglobin 14.1, platelets 828, ferritin 22 11/02/2021: Hemoglobin 12.9, platelets 435, ferritin 22, iron saturation 54%  04/06/2022: Platelets 763 05/20/2022: Platelets 782, hemoglobin 13.2, iron saturation 64%, ferritin 42, folate 13.3, B12 222 06/09/22: platelets 826 (shingles) 06/21/2022: Platelets 701, iron saturation 66%, ferritin 31 07/07/2022: Platelets 664, ferritin 34, iron saturation 56%, hemoglobin 14.6 08/15/2022: Platelets 857 08/26/2022: Platelets 938, SPEP, KL ratio: Normal, rheumatoid factor: 10: Normal, ANA, SSA SSB: Negative   Severe palpitations: Related to anagrelide. D/C anagrelide and her palpitations have mostly resolved.   Plan: Since the patient is intolerant to anagrelide and hydroxyurea, we decided to initiate treatment with Jakafi at the  low-dose of 5 mg twice a day.    Facial Plastic surgery (Dr.Miller): Nov 20th 2023  Neuropathic pain on the chest wall:   Recheck labs in 2 weeks and follow-up    I discussed the assessment and treatment plan with the patient. The patient was provided an opportunity to ask questions and all were answered. The patient agreed with the plan and demonstrated an understanding of the instructions. The patient was advised to call back or seek an in-person evaluation if the symptoms worsen or if the condition fails to improve as anticipated.   I provided 12 minutes of non-face-to-face time during this encounter.  This includes time for charting and coordination of care   Kara Meek, MD  I Kara [name] am acting as a scribe for Dr.Kamerin Axford  I have reviewed the above documentation for accuracy and completeness, and I agree with the above.

## 2022-09-02 ENCOUNTER — Telehealth: Payer: Self-pay

## 2022-09-02 ENCOUNTER — Telehealth: Payer: Self-pay | Admitting: Pharmacy Technician

## 2022-09-02 ENCOUNTER — Other Ambulatory Visit: Payer: Self-pay

## 2022-09-02 ENCOUNTER — Inpatient Hospital Stay (HOSPITAL_BASED_OUTPATIENT_CLINIC_OR_DEPARTMENT_OTHER): Payer: Medicare Other | Admitting: Hematology and Oncology

## 2022-09-02 ENCOUNTER — Other Ambulatory Visit (HOSPITAL_COMMUNITY): Payer: Self-pay

## 2022-09-02 DIAGNOSIS — D473 Essential (hemorrhagic) thrombocythemia: Secondary | ICD-10-CM

## 2022-09-02 LAB — MULTIPLE MYELOMA PANEL, SERUM
Albumin SerPl Elph-Mcnc: 4.4 g/dL (ref 2.9–4.4)
Albumin/Glob SerPl: 2 — ABNORMAL HIGH (ref 0.7–1.7)
Alpha 1: 0.3 g/dL (ref 0.0–0.4)
Alpha2 Glob SerPl Elph-Mcnc: 0.6 g/dL (ref 0.4–1.0)
B-Globulin SerPl Elph-Mcnc: 1 g/dL (ref 0.7–1.3)
Gamma Glob SerPl Elph-Mcnc: 0.5 g/dL (ref 0.4–1.8)
Globulin, Total: 2.3 g/dL (ref 2.2–3.9)
IgA: 279 mg/dL (ref 87–352)
IgG (Immunoglobin G), Serum: 640 mg/dL (ref 586–1602)
IgM (Immunoglobulin M), Srm: 31 mg/dL (ref 26–217)
Total Protein ELP: 6.7 g/dL (ref 6.0–8.5)

## 2022-09-02 MED ORDER — RUXOLITINIB PHOSPHATE 5 MG PO TABS
5.0000 mg | ORAL_TABLET | Freq: Two times a day (BID) | ORAL | 3 refills | Status: DC
  Filled 2022-09-02: qty 60, 30d supply, fill #0

## 2022-09-02 NOTE — Assessment & Plan Note (Addendum)
Lab: 06/18/21:  Hb 11.2, Platelet 778 07/30/2021: Hemoglobin 12.9, platelets 783, iron saturation 13%, ferritin 7  10/01/2021: Hemoglobin 14.1, platelets 828, ferritin 22 11/02/2021: Hemoglobin 12.9, platelets 435, ferritin 22, iron saturation 54%  04/06/2022: Platelets 763 05/20/2022: Platelets 782, hemoglobin 13.2, iron saturation 64%, ferritin 42, folate 13.3, B12 222 06/09/22: platelets 826 (shingles) 06/21/2022: Platelets 701, iron saturation 66%, ferritin 31 07/07/2022: Platelets 664, ferritin 34, iron saturation 56%, hemoglobin 14.6 08/15/2022: Platelets 857 08/26/2022: Platelets 938, SPEP, KL ratio: Normal, rheumatoid factor: 10: Normal, ANA, SSA SSB: Negative   Severe palpitations: Related to anagrelide. D/C anagrelide and her palpitations have mostly resolved.   Plan: Since the patient is intolerant to anagrelide and hydroxyurea, we decided to initiate treatment with Jakafi at the low-dose of 5 mg twice a day.    Facial Plastic surgery (Dr.Miller): Nov 20th 2023  Neuropathic pain on the chest wall:   Recheck labs in 2 weeks and follow-up

## 2022-09-02 NOTE — Telephone Encounter (Signed)
Oral Oncology Patient Advocate Encounter  After completing a benefits investigation, patient does not show active prescription drug coverage.  I will reach out to the patient to begin the PAP process.   Jinger Neighbors, CPhT-Adv Oncology Pharmacy Patient Advocate [facility] Direct Number: [phone]  Fax: [phone]

## 2022-09-02 NOTE — Telephone Encounter (Signed)
Oral Oncology Patient Advocate Encounter   Began application for assistance for Jakafi through [employer].   Application submitted via online portal.   [employer] phone number [phone].   I will continue to check the status until final determination.   Jinger Neighbors, CPhT-Adv Oncology Pharmacy Patient Advocate [facility] Direct Number: [phone]  Fax: [phone]

## 2022-09-02 NOTE — Telephone Encounter (Signed)
Oral Oncology Pharmacist Encounter  Received new prescription for Jakafi (ruxolitinib) for the treatment of essential thrombocytosis, planned duration until disease progression or unacceptable toxicity.  Labs from 07/15/22 (CMP) and 08/26/22 (CBC) assessed, no interventions needed. Prescription dose and frequency assessed for appropriateness. Message sent to MD to see if new baseline labs are needed.   Current medication list in Epic reviewed, no significant/ relevant DDIs with Jakafi identified.   Evaluated chart and no patient barriers to medication adherence noted.   Patient agreement for treatment documented in MD note on 09/02/22.  Prescription has been e-scribed to the [facility] for benefits analysis and approval.  Oral Oncology Clinic will continue to follow for insurance authorization, copayment issues, initial counseling and start date.  Bethel Born, PharmD Hematology/Oncology Clinical Pharmacist [facility] Oral Chemotherapy [facility] [phone] 09/02/2022 9:49 AM

## 2022-09-05 NOTE — Telephone Encounter (Signed)
Oral Oncology Patient Advocate Encounter  Received faxed update from [employer]. Application has been received and is under review.   Jinger Neighbors, CPhT-Adv Oncology Pharmacy Patient Advocate [facility] Direct Number: [phone]  Fax: [phone]

## 2022-09-06 ENCOUNTER — Telehealth: Payer: Self-pay | Admitting: Hematology and Oncology

## 2022-09-06 NOTE — Telephone Encounter (Signed)
Contacted patient to scheduled appointments. Patient is aware of appointments that are scheduled.   

## 2022-09-08 DIAGNOSIS — R202 Paresthesia of skin: Secondary | ICD-10-CM | POA: Diagnosis not present

## 2022-09-08 DIAGNOSIS — L239 Allergic contact dermatitis, unspecified cause: Secondary | ICD-10-CM | POA: Diagnosis not present

## 2022-09-08 NOTE — Telephone Encounter (Signed)
Oral Oncology Patient Advocate Encounter   Received notification that the application for assistance for Jakafi through [facility] has been approved.   [employer] phone number [phone].   Effective dates: 09/07/22 through 04/18/23  I have spoken to the patient.  POI is required to be faxed to the program for full approval. Program fax [phone].  Jinger Neighbors, CPhT-Adv Oncology Pharmacy Patient Advocate [facility] Direct Number: [phone]  Fax: [phone]

## 2022-09-09 ENCOUNTER — Telehealth: Payer: Self-pay

## 2022-09-09 NOTE — Telephone Encounter (Signed)
Pt called with concern for lab appt being same day as MD visit. Message sent to scheduling to r/s lab appt for 5/28.

## 2022-09-11 NOTE — Progress Notes (Signed)
Patient Care Team: Pcp, No as PCP - General Serena Croissant, MD as Consulting Physician (Hematology and Oncology)  DIAGNOSIS:  Encounter Diagnosis  Name Primary?   Essential thrombocytosis (HCC) Yes      CHIEF COMPLIANT: thrombocytosis and hereditary hemochromatosis   INTERVAL HISTORY: Kara [name] is a 72 y.o. female with a history of thrombocytosis and hereditary hemochromatosis. She presents to the clinic for a telephone follow-up to recheck labs. She reports the rash on chest was unbearable on Saturday night. She says she use the Cortizone and it was a relief. She states that it stayed until about noon on Sunday. She have some numbness on both side of the face. She complains of not feeling good, she have felt dizzy.   ALLERGIES:  is allergic to penicillins and tramadol.  MEDICATIONS:  Current Outpatient Medications  Medication Sig Dispense Refill   ACETAMINOPHEN PO Take 250 mg by mouth 4 (four) times daily as needed (pain).     aspirin EC 81 MG tablet Take 1 tablet (81 mg total) by mouth daily.     ibuprofen (ADVIL) 800 MG tablet Take 800 mg by mouth as needed.     ruxolitinib phosphate (JAKAFI) 5 MG tablet Take 1 tablet (5 mg total) by mouth 2 (two) times daily. 60 tablet 3   valACYclovir (VALTREX) 500 MG tablet Take 1 tablet PO BID for 10 days.     No current facility-administered medications for this visit.    PHYSICAL EXAMINATION: ECOG PERFORMANCE STATUS: 1 - Symptomatic but completely ambulatory  Vitals:   09/16/22 0952  BP: (!) 147/80  Pulse: 71  Resp: 18  Temp: (!) 97.4 F (36.3 C)  SpO2: 97%   Filed Weights   09/16/22 0952  Weight: 130 lb 1.6 oz (59 kg)      LABORATORY DATA:  I have reviewed the data as listed    Latest Ref Rng & Units 09/14/2022   10:14 AM 07/15/2022   12:19 PM 12/16/2021    2:30 PM  CMP  Glucose 70 - 99 mg/dL 94  94  161   BUN 8 - 23 mg/dL 18  12  13    Creatinine 0.44 - 1.00 mg/dL 0.96  0.45  4.09   Sodium 135 - 145 mmol/L 139   135  142   Potassium 3.5 - 5.1 mmol/L 4.1  4.1  4.9   Chloride 98 - 111 mmol/L 107  103  106   CO2 22 - 32 mmol/L 24  25  21    Calcium 8.9 - 10.3 mg/dL 9.3  9.4  9.6   Total Protein 6.5 - 8.1 g/dL 7.3  6.9  6.9   Total Bilirubin 0.3 - 1.2 mg/dL 0.5  0.4  0.2   Alkaline Phos 38 - 126 U/L 79  64  69   AST 15 - 41 U/L 14  16  17    ALT 0 - 44 U/L 8  9  10      Lab Results  Component Value Date   WBC 9.8 09/14/2022   HGB 13.8 09/14/2022   HCT 40.6 09/14/2022   MCV 90.8 09/14/2022   PLT 951 (HH) 09/14/2022   NEUTROABS 6.3 09/14/2022    ASSESSMENT & PLAN:  Essential thrombocytosis (HCC) Lab: 06/18/21:  Hb 11.2, Platelet 778 07/30/2021: Hemoglobin 12.9, platelets 783, iron saturation 13%, ferritin 7  10/01/2021: Hemoglobin 14.1, platelets 828, ferritin 22 11/02/2021: Hemoglobin 12.9, platelets 435, ferritin 22, iron saturation 54%  04/06/2022: Platelets 763 05/20/2022: Platelets 782, hemoglobin 13.2,  iron saturation 64%, ferritin 42, folate 13.3, B12 222 06/09/22: platelets 826 (shingles) 06/21/2022: Platelets 701, iron saturation 66%, ferritin 31 07/07/2022: Platelets 664, ferritin 34, iron saturation 56%, hemoglobin 14.6 08/15/2022: Platelets 857 08/26/2022: Platelets 938, SPEP, KL ratio: Normal, rheumatoid factor: 10: Normal, ANA, SSA SSB: Negative 09/14/2022: Ferritin 30, iron saturation 56%, hemoglobin 13.8, platelets 951   Severe palpitations: Related to anagrelide. D/C anagrelide and her palpitations have mostly resolved.   Plan: Since the patient is intolerant to anagrelide and hydroxyurea, we decided to initiate treatment with Jakafi at the low-dose of 5 mg twice a day.    Patient wants to try phlebotomy x 2 to see if the rash would improve with the phlebotomy. She will stop Jakafi on 09/18/2022.  Neuropathic pain in the chest wall  Follow-up in 1 week to see how she is tolerating Jakafi.  No orders of the defined types were placed in this encounter.  The patient has a good  understanding of the overall plan. she agrees with it. she will call with any problems that may develop before the next visit here. Total time spent: 30 mins including face to face time and time spent for planning, charting and co-ordination of care   Tamsen Meek, MD 09/16/22    I Janan Ridge am acting as a Neurosurgeon for [employer]  I have reviewed the above documentation for accuracy and completeness, and I agree with the above.

## 2022-09-13 ENCOUNTER — Telehealth: Payer: Self-pay | Admitting: Hematology and Oncology

## 2022-09-13 NOTE — Telephone Encounter (Signed)
Rescheduled appointment per staff message. Patient is aware of the rescheduled appointment.

## 2022-09-14 ENCOUNTER — Other Ambulatory Visit: Payer: Self-pay

## 2022-09-14 ENCOUNTER — Inpatient Hospital Stay: Payer: Medicare Other

## 2022-09-14 ENCOUNTER — Telehealth: Payer: Self-pay

## 2022-09-14 DIAGNOSIS — D473 Essential (hemorrhagic) thrombocythemia: Secondary | ICD-10-CM

## 2022-09-14 DIAGNOSIS — R0789 Other chest pain: Secondary | ICD-10-CM | POA: Diagnosis not present

## 2022-09-14 DIAGNOSIS — R21 Rash and other nonspecific skin eruption: Secondary | ICD-10-CM | POA: Diagnosis not present

## 2022-09-14 DIAGNOSIS — M792 Neuralgia and neuritis, unspecified: Secondary | ICD-10-CM | POA: Diagnosis not present

## 2022-09-14 DIAGNOSIS — D75839 Thrombocytosis, unspecified: Secondary | ICD-10-CM | POA: Diagnosis not present

## 2022-09-14 LAB — CMP (CANCER CENTER ONLY)
ALT: 8 U/L (ref 0–44)
AST: 14 U/L — ABNORMAL LOW (ref 15–41)
Albumin: 4.7 g/dL (ref 3.5–5.0)
Alkaline Phosphatase: 79 U/L (ref 38–126)
Anion gap: 8 (ref 5–15)
BUN: 18 mg/dL (ref 8–23)
CO2: 24 mmol/L (ref 22–32)
Calcium: 9.3 mg/dL (ref 8.9–10.3)
Chloride: 107 mmol/L (ref 98–111)
Creatinine: 0.53 mg/dL (ref 0.44–1.00)
GFR, Estimated: >60 mL/min (ref >60)
Glucose, Bld: 94 mg/dL (ref 70–99)
Potassium: 4.1 mmol/L (ref 3.5–5.1)
Sodium: 139 mmol/L (ref 135–145)
Total Bilirubin: 0.5 mg/dL (ref 0.3–1.2)
Total Protein: 7.3 g/dL (ref 6.5–8.1)

## 2022-09-14 LAB — CBC WITH DIFFERENTIAL (CANCER CENTER ONLY)
Abs Immature Granulocytes: 0.04 10*3/uL (ref 0.00–0.07)
Basophils Absolute: 0.1 10*3/uL (ref 0.0–0.1)
Basophils Relative: 1 %
Eosinophils Absolute: 0.5 10*3/uL (ref 0.0–0.5)
Eosinophils Relative: 5 %
HCT: 40.6 % (ref 36.0–46.0)
Hemoglobin: 13.8 g/dL (ref 12.0–15.0)
Immature Granulocytes: 0 %
Lymphocytes Relative: 22 %
Lymphs Abs: 2.2 10*3/uL (ref 0.7–4.0)
MCH: 30.9 pg (ref 26.0–34.0)
MCHC: 34 g/dL (ref 30.0–36.0)
MCV: 90.8 fL (ref 80.0–100.0)
Monocytes Absolute: 0.8 10*3/uL (ref 0.1–1.0)
Monocytes Relative: 8 %
Neutro Abs: 6.3 10*3/uL (ref 1.7–7.7)
Neutrophils Relative %: 64 %
Platelet Count: 951 10*3/uL (ref 150–400)
RBC: 4.47 MIL/uL (ref 3.87–5.11)
RDW: 15.4 % (ref 11.5–15.5)
WBC Count: 9.8 10*3/uL (ref 4.0–10.5)
nRBC: 0 % (ref 0.0–0.2)

## 2022-09-14 LAB — IRON AND IRON BINDING CAPACITY (CC-WL,HP ONLY)
Iron: 156 ug/dL (ref 28–170)
Saturation Ratios: 56 % — ABNORMAL HIGH (ref 10.4–31.8)
TIBC: 277 ug/dL (ref 250–450)
UIBC: 121 ug/dL — ABNORMAL LOW (ref 148–442)

## 2022-09-14 NOTE — Telephone Encounter (Signed)
Pt arrived to [facility] waiting room to discuss rash which has not resolved. She is concerned per [facility] Journal that this rash is related to her hemachromatosis, and she is interested to see if therapeutic phlebotomy will resolve the rash. Advised pt I would need to discuss with MD and we would need further lab tests to determine. Per MD, ordered Ferritin & Iron studies. Unfortunately these labs were not drawn so she will return to [facility] labs later today at 3:30 to be redrawn. MD made aware of platelet count of 951 as well. Pt is to start JAKAFI as prescribed but is reluctant to start before knowing if she needs therapeutic phlebotomy.

## 2022-09-15 ENCOUNTER — Telehealth: Payer: Self-pay | Admitting: Hematology and Oncology

## 2022-09-15 ENCOUNTER — Other Ambulatory Visit: Payer: Medicare Other

## 2022-09-15 LAB — FERRITIN: Ferritin: 30 ng/mL (ref 11–307)

## 2022-09-15 NOTE — Telephone Encounter (Signed)
Scheduled appointments per staff message. Patient is aware of all made appointments. 

## 2022-09-16 ENCOUNTER — Inpatient Hospital Stay (HOSPITAL_BASED_OUTPATIENT_CLINIC_OR_DEPARTMENT_OTHER): Payer: Medicare Other | Admitting: Hematology and Oncology

## 2022-09-16 ENCOUNTER — Inpatient Hospital Stay: Payer: Medicare Other

## 2022-09-16 ENCOUNTER — Telehealth: Payer: Self-pay

## 2022-09-16 ENCOUNTER — Other Ambulatory Visit: Payer: Medicare Other

## 2022-09-16 ENCOUNTER — Ambulatory Visit: Payer: Medicare Other

## 2022-09-16 VITALS — BP 147/80 | HR 71 | Temp 97.4°F | Resp 18 | Ht 61.0 in | Wt 130.1 lb

## 2022-09-16 DIAGNOSIS — D473 Essential (hemorrhagic) thrombocythemia: Secondary | ICD-10-CM

## 2022-09-16 DIAGNOSIS — R0789 Other chest pain: Secondary | ICD-10-CM | POA: Diagnosis not present

## 2022-09-16 DIAGNOSIS — M792 Neuralgia and neuritis, unspecified: Secondary | ICD-10-CM | POA: Diagnosis not present

## 2022-09-16 DIAGNOSIS — D75839 Thrombocytosis, unspecified: Secondary | ICD-10-CM | POA: Diagnosis not present

## 2022-09-16 DIAGNOSIS — R21 Rash and other nonspecific skin eruption: Secondary | ICD-10-CM | POA: Diagnosis not present

## 2022-09-16 MED ORDER — SODIUM CHLORIDE 0.9 % IV SOLN: Freq: Once | INTRAVENOUS | Status: AC

## 2022-09-16 NOTE — Patient Instructions (Signed)

## 2022-09-16 NOTE — Telephone Encounter (Signed)
Oral Chemotherapy Pharmacist Encounter   I spoke with patient for overview of: Jakafi (ruxolitinib) for the treatment of thrombocytosis and hereditary hemochromatosis, planned duration until disease progression or unacceptable toxicity.   Counseled patient on administration, dosing, side effects, monitoring, drug-food interactions, safe handling, storage, and disposal.  Patient will take Jakafi 5mg  tablets, 1 tablet by mouth 2 times daily, with or without food.  Patient knows to avoid grapefruit and grapefruit juice.  Jakafi planned start date: 09/19/2022; patient is receiving therapeutic phlebotomy today and was instructed by Dr. Pamelia Hoit to not start Jakafi until 48 hours after.  Adverse effects include but are not limited to: decreased blood counts, increased lipid profile, increased cholesterol, increased liver enzymes, dizziness, headache, diarrhea, increased risk of some infections (including bacterial, mycobacterial, fungal, or viral), and increased risk of non-melanoma skin cancers.    Reviewed with patient importance of keeping a medication schedule and plan for any missed doses. No barriers to medication adherence identified.  Medication reconciliation performed and medication/allergy list updated.  All questions answered.  Ms. Hoselton voiced understanding and appreciation.   Medication education handout placed in mail for patient. Patient knows to call the office with questions or concerns. Oral Chemotherapy Clinic phone number provided to patient.   Renaee Munda, PharmD PGY-2 Pharmacy Resident Hematology/Oncology [phone]  09/16/2022 3:38 PM

## 2022-09-16 NOTE — Assessment & Plan Note (Addendum)
Lab: 06/18/21:  Hb 11.2, Platelet 778 07/30/2021: Hemoglobin 12.9, platelets 783, iron saturation 13%, ferritin 7  10/01/2021: Hemoglobin 14.1, platelets 828, ferritin 22 11/02/2021: Hemoglobin 12.9, platelets 435, ferritin 22, iron saturation 54%  04/06/2022: Platelets 763 05/20/2022: Platelets 782, hemoglobin 13.2, iron saturation 64%, ferritin 42, folate 13.3, B12 222 06/09/22: platelets 826 (shingles) 06/21/2022: Platelets 701, iron saturation 66%, ferritin 31 07/07/2022: Platelets 664, ferritin 34, iron saturation 56%, hemoglobin 14.6 08/15/2022: Platelets 857 08/26/2022: Platelets 938, SPEP, KL ratio: Normal, rheumatoid factor: 10: Normal, ANA, SSA SSB: Negative 09/14/2022: Ferritin 30, iron saturation 56%, hemoglobin 13.8, platelets 951   Severe palpitations: Related to anagrelide. D/C anagrelide and her palpitations have mostly resolved.   Plan: Since the patient is intolerant to anagrelide and hydroxyurea, we decided to initiate treatment with Jakafi at the low-dose of 5 mg twice a day.    Patient wants to try phlebotomy x 2 before she starts Jakafi because she wonders if the hemochromatosis is a cause of the skin pigmentation/rash.  Neuropathic pain in the chest wall

## 2022-09-16 NOTE — Progress Notes (Signed)
Kara [name] presents today for phlebotomy per MD orders. Phlebotomy procedure started at 1608 and ended at 1620. 505 cc removed. Patient tolerated procedure well.  Patient received 500 mL IVF over one hour post phlebotomy.  Tolerated procedure well, VSS at discharge. IV needle removed intact.  Ambulated to lobby.

## 2022-09-21 ENCOUNTER — Other Ambulatory Visit: Payer: Self-pay | Admitting: *Deleted

## 2022-09-21 ENCOUNTER — Other Ambulatory Visit: Payer: Self-pay | Admitting: Hematology and Oncology

## 2022-09-22 ENCOUNTER — Encounter: Payer: Self-pay | Admitting: *Deleted

## 2022-09-22 ENCOUNTER — Telehealth: Payer: Medicare Other | Admitting: Hematology and Oncology

## 2022-09-22 ENCOUNTER — Inpatient Hospital Stay: Payer: Medicare Other | Attending: Hematology and Oncology | Admitting: Hematology and Oncology

## 2022-09-22 ENCOUNTER — Other Ambulatory Visit: Payer: Medicare Other

## 2022-09-22 ENCOUNTER — Other Ambulatory Visit: Payer: Self-pay

## 2022-09-22 ENCOUNTER — Inpatient Hospital Stay: Payer: Medicare Other

## 2022-09-22 VITALS — BP 154/84 | HR 83 | Temp 97.8°F | Resp 18 | Ht 61.0 in | Wt 129.4 lb

## 2022-09-22 DIAGNOSIS — R21 Rash and other nonspecific skin eruption: Secondary | ICD-10-CM | POA: Diagnosis not present

## 2022-09-22 DIAGNOSIS — D75839 Thrombocytosis, unspecified: Secondary | ICD-10-CM | POA: Diagnosis not present

## 2022-09-22 DIAGNOSIS — D473 Essential (hemorrhagic) thrombocythemia: Secondary | ICD-10-CM | POA: Diagnosis not present

## 2022-09-22 LAB — CBC WITH DIFFERENTIAL (CANCER CENTER ONLY)
Abs Immature Granulocytes: 0.03 10*3/uL (ref 0.00–0.07)
Basophils Absolute: 0.1 10*3/uL (ref 0.0–0.1)
Basophils Relative: 1 %
Eosinophils Absolute: 0.2 10*3/uL (ref 0.0–0.5)
Eosinophils Relative: 3 %
HCT: 37 % (ref 36.0–46.0)
Hemoglobin: 12.5 g/dL (ref 12.0–15.0)
Immature Granulocytes: 0 %
Lymphocytes Relative: 26 %
Lymphs Abs: 2.5 10*3/uL (ref 0.7–4.0)
MCH: 30.7 pg (ref 26.0–34.0)
MCHC: 33.8 g/dL (ref 30.0–36.0)
MCV: 90.9 fL (ref 80.0–100.0)
Monocytes Absolute: 0.7 10*3/uL (ref 0.1–1.0)
Monocytes Relative: 7 %
Neutro Abs: 6.3 10*3/uL (ref 1.7–7.7)
Neutrophils Relative %: 63 %
Platelet Count: 908 10*3/uL (ref 150–400)
RBC: 4.07 MIL/uL (ref 3.87–5.11)
RDW: 15.2 % (ref 11.5–15.5)
WBC Count: 9.8 10*3/uL (ref 4.0–10.5)
nRBC: 0 % (ref 0.0–0.2)

## 2022-09-22 LAB — CMP (CANCER CENTER ONLY)
ALT: 9 U/L (ref 0–44)
AST: 18 U/L (ref 15–41)
Albumin: 4.6 g/dL (ref 3.5–5.0)
Alkaline Phosphatase: 68 U/L (ref 38–126)
Anion gap: 7 (ref 5–15)
BUN: 12 mg/dL (ref 8–23)
CO2: 24 mmol/L (ref 22–32)
Calcium: 9.3 mg/dL (ref 8.9–10.3)
Chloride: 106 mmol/L (ref 98–111)
Creatinine: 0.6 mg/dL (ref 0.44–1.00)
GFR, Estimated: >60 mL/min (ref >60)
Glucose, Bld: 98 mg/dL (ref 70–99)
Potassium: 4.2 mmol/L (ref 3.5–5.1)
Sodium: 137 mmol/L (ref 135–145)
Total Bilirubin: 0.4 mg/dL (ref 0.3–1.2)
Total Protein: 7.1 g/dL (ref 6.5–8.1)

## 2022-09-22 NOTE — Progress Notes (Signed)
CRITICAL VALUE STICKER  CRITICAL VALUE: Plt 908  RECEIVER (on-site recipient of call): Adelina Mings, RN  DATE & TIME NOTIFIED: 09/22/22 at 0940  MD NOTIFIED: Serena Croissant, MD  TIME OF NOTIFICATION: 09/22/22 at 1610  RESPONSE: MD notified and verbalized understanding.

## 2022-09-22 NOTE — Assessment & Plan Note (Signed)
Lab: 06/18/21:  Hb 11.2, Platelet 778 07/30/2021: Hemoglobin 12.9, platelets 783, iron saturation 13%, ferritin 7  10/01/2021: Hemoglobin 14.1, platelets 828, ferritin 22 11/02/2021: Hemoglobin 12.9, platelets 435, ferritin 22, iron saturation 54%  04/06/2022: Platelets 763 05/20/2022: Platelets 782, hemoglobin 13.2, iron saturation 64%, ferritin 42, folate 13.3, B12 222 06/09/22: platelets 826 (shingles) 06/21/2022: Platelets 701, iron saturation 66%, ferritin 31 07/07/2022: Platelets 664, ferritin 34, iron saturation 56%, hemoglobin 14.6 08/15/2022: Platelets 857 08/26/2022: Platelets 938, SPEP, KL ratio: Normal, rheumatoid factor: 10: Normal, ANA, SSA SSB: Negative 09/14/2022: Ferritin 30, iron saturation 56%, hemoglobin 13.8, platelets 951   Severe palpitations: Related to anagrelide. D/C anagrelide and her palpitations have mostly resolved.   Plan: Since the patient is intolerant to anagrelide and hydroxyurea, we decided to initiate treatment with Jakafi at the low-dose of 5 mg twice a day.     Patient wants to try phlebotomy x 2 to see if the rash would improve with the phlebotomy. She will start Jakafi on 09/18/2022.  Jakafi toxicities:  Return to clinic in 2 weeks for follow-up with Jonny Ruiz

## 2022-09-22 NOTE — Progress Notes (Signed)
Patient Care Team: Pcp, No as PCP - General Serena Croissant, MD as Consulting Physician (Hematology and Oncology)  DIAGNOSIS:  Encounter Diagnoses  Name Primary?   Essential thrombocytosis (HCC) Yes   Hereditary hemochromatosis (HCC)       CHIEF COMPLIANT:  thrombocytosis and hereditary hemochromatosis   INTERVAL HISTORY: Kara [name] is a 72 y.o. female with a history of thrombocytosis and hereditary hemochromatosis. She presents to the clinic for a follow-up. She reports that she feels a whole lot better. She denies any rash on chest after phlebotomy. Her only complaint that she has a slight occasionally light headiness. She had very bad tingling in feet. One foot was in bad pain.  ALLERGIES:  is allergic to penicillins and tramadol.  MEDICATIONS:  Current Outpatient Medications  Medication Sig Dispense Refill   ACETAMINOPHEN PO Take 250 mg by mouth 4 (four) times daily as needed (pain).     aspirin EC 81 MG tablet Take 1 tablet (81 mg total) by mouth daily.     ibuprofen (ADVIL) 800 MG tablet Take 800 mg by mouth as needed.     ruxolitinib phosphate (JAKAFI) 5 MG tablet Take 1 tablet (5 mg total) by mouth 2 (two) times daily. 60 tablet 3   valACYclovir (VALTREX) 500 MG tablet Take 1 tablet PO BID for 10 days.     No current facility-administered medications for this visit.    PHYSICAL EXAMINATION: ECOG PERFORMANCE STATUS: 1 - Symptomatic but completely ambulatory  Vitals:   09/22/22 0952  BP: (!) 154/84  Pulse: 83  Resp: 18  Temp: 97.8 F (36.6 C)  SpO2: 95%   Filed Weights   09/22/22 0952  Weight: 129 lb 6.4 oz (58.7 kg)      LABORATORY DATA:  I have reviewed the data as listed    Latest Ref Rng & Units 09/22/2022    9:26 AM 09/14/2022   10:14 AM 07/15/2022   12:19 PM  CMP  Glucose 70 - 99 mg/dL 98  94  94   BUN 8 - 23 mg/dL 12  18  12    Creatinine 0.44 - 1.00 mg/dL 4.09  8.11  9.14   Sodium 135 - 145 mmol/L 137  139  135   Potassium 3.5 - 5.1 mmol/L 4.2   4.1  4.1   Chloride 98 - 111 mmol/L 106  107  103   CO2 22 - 32 mmol/L 24  24  25    Calcium 8.9 - 10.3 mg/dL 9.3  9.3  9.4   Total Protein 6.5 - 8.1 g/dL 7.1  7.3  6.9   Total Bilirubin 0.3 - 1.2 mg/dL 0.4  0.5  0.4   Alkaline Phos 38 - 126 U/L 68  79  64   AST 15 - 41 U/L 18  14  16    ALT 0 - 44 U/L 9  8  9      Lab Results  Component Value Date   WBC 9.8 09/22/2022   HGB 12.5 09/22/2022   HCT 37.0 09/22/2022   MCV 90.9 09/22/2022   PLT 908 (HH) 09/22/2022   NEUTROABS 6.3 09/22/2022    ASSESSMENT & PLAN:  Essential thrombocytosis (HCC) Lab: 06/18/21:  Hb 11.2, Platelet 778 07/30/2021: Hemoglobin 12.9, platelets 783, iron saturation 13%, ferritin 7  10/01/2021: Hemoglobin 14.1, platelets 828, ferritin 22 11/02/2021: Hemoglobin 12.9, platelets 435, ferritin 22, iron saturation 54%  04/06/2022: Platelets 763 05/20/2022: Platelets 782, hemoglobin 13.2, iron saturation 64%, ferritin 42, folate 13.3, B12 222 06/09/22:  platelets 826 (shingles) 06/21/2022: Platelets 701, iron saturation 66%, ferritin 31 07/07/2022: Platelets 664, ferritin 34, iron saturation 56%, hemoglobin 14.6 08/15/2022: Platelets 857 08/26/2022: Platelets 938, SPEP, KL ratio: Normal, rheumatoid factor: 10: Normal, ANA, SSA SSB: Negative 09/14/2022: Ferritin 30, iron saturation 56%, hemoglobin 13.8, platelets 951   Severe palpitations: Related to anagrelide. D/C anagrelide and her palpitations have mostly resolved.   Plan: Since the patient is intolerant to anagrelide and hydroxyurea, we decided to initiate treatment with Jakafi at the low-dose of 5 mg twice a day.     Rash: Remarkable improvement in the rash since she is doing phlebotomy. She will start Jakafi on 09/18/2022.  Jakafi toxicities: Denies any major adverse effects to [employer]  Will plan to do monthly phlebotomy treatments x 3.  I will see the patient back with labs and phlebotomy every month.   Orders Placed This Encounter  Procedures   CBC with Differential  ([facility] Only)    Standing Status:   Standing    Number of Occurrences:   20    Standing Expiration Date:   09/22/2023   Ferritin    Standing Status:   Standing    Number of Occurrences:   20    Standing Expiration Date:   09/22/2023   Iron and Iron Binding Capacity ([facility] only)    Standing Status:   Standing    Number of Occurrences:   20    Standing Expiration Date:   09/22/2023   CMP (Cancer Center only)    Standing Status:   Standing    Number of Occurrences:   20    Standing Expiration Date:   09/22/2023   The patient has a good understanding of the overall plan. she agrees with it. she will call with any problems that may develop before the next visit here. Total time spent: 30 mins including face to face time and time spent for planning, charting and co-ordination of care   Tamsen Meek, MD 09/22/22    I Janan Ridge am acting as a Neurosurgeon for [employer]  I have reviewed the above documentation for accuracy and completeness, and I agree with the above.

## 2022-09-23 ENCOUNTER — Other Ambulatory Visit: Payer: Self-pay

## 2022-09-23 ENCOUNTER — Inpatient Hospital Stay: Payer: Medicare Other

## 2022-09-23 DIAGNOSIS — R21 Rash and other nonspecific skin eruption: Secondary | ICD-10-CM | POA: Diagnosis not present

## 2022-09-23 DIAGNOSIS — D473 Essential (hemorrhagic) thrombocythemia: Secondary | ICD-10-CM | POA: Diagnosis not present

## 2022-09-23 DIAGNOSIS — D75839 Thrombocytosis, unspecified: Secondary | ICD-10-CM | POA: Diagnosis not present

## 2022-09-23 MED ORDER — SODIUM CHLORIDE 0.9 % IV SOLN: INTRAVENOUS | Status: DC

## 2022-09-23 NOTE — Patient Instructions (Signed)

## 2022-09-23 NOTE — Progress Notes (Signed)
Received following message from Joella Prince, RN in infusion: "Good morning. This patient is scheduled for a phlebotomy today at 1600 with IVF. It looks like she saw [place] and got labs done on 6/6. Her parameters are to do phlebotomy for iron saturation greater than 50 but she did not get iron sat drawn yesterday. Did you want to schedule her a lab apt prior to phlebotomy?"  Per Dr Pamelia Hoit, proceed with phlebotomy today and once a month for 3 months. MD states iron sat parameter is not necessary for this phlebotomy.  Dorathy Daft, RN and Idelle Jo, charge RN made aware.

## 2022-09-23 NOTE — Progress Notes (Signed)
Kara [name] presents today for phlebotomy per MD orders. Phlebotomy procedure started at 1436 and ended at 1446. 500 grams removed via LAC 18G. Patient observed for 60 minutes after procedure while receiving IVF without any incident. Patient tolerated procedure well. IV needle removed intact.

## 2022-09-26 ENCOUNTER — Telehealth: Payer: Self-pay

## 2022-09-26 ENCOUNTER — Encounter: Payer: Self-pay | Admitting: Internal Medicine

## 2022-09-26 ENCOUNTER — Ambulatory Visit (INDEPENDENT_AMBULATORY_CARE_PROVIDER_SITE_OTHER): Payer: Medicare Other | Admitting: Internal Medicine

## 2022-09-26 VITALS — BP 130/80 | HR 71 | Temp 98.5°F | Ht 61.0 in | Wt 128.4 lb

## 2022-09-26 DIAGNOSIS — Z87891 Personal history of nicotine dependence: Secondary | ICD-10-CM | POA: Insufficient documentation

## 2022-09-26 DIAGNOSIS — B0229 Other postherpetic nervous system involvement: Secondary | ICD-10-CM | POA: Diagnosis not present

## 2022-09-26 DIAGNOSIS — D473 Essential (hemorrhagic) thrombocythemia: Secondary | ICD-10-CM

## 2022-09-26 DIAGNOSIS — R0789 Other chest pain: Secondary | ICD-10-CM | POA: Diagnosis not present

## 2022-09-26 DIAGNOSIS — R200 Anesthesia of skin: Secondary | ICD-10-CM | POA: Diagnosis not present

## 2022-09-26 DIAGNOSIS — Z1211 Encounter for screening for malignant neoplasm of colon: Secondary | ICD-10-CM

## 2022-09-26 NOTE — Progress Notes (Signed)
[facility]  Phone: [phone]  New patient visit  Visit Date: 09/26/2022 Patient: Kara [name]   DOB: 08-22-1950   72 y.o. Female  MRN: [id number] PCP:  Lula Olszewski, MD  (establishing care today)  Today's Health Care Provider: Lula Olszewski, MD   Assessment and Plan:    Tyleigh was seen today for new patient (initial visit) and rash.  Colon cancer screening -     Cologuard  Essential thrombocytosis (HCC) Overview: Oncology: Dr. Pamelia Hoit 09/16/2022 visit: Essential thrombocytosis (HCC) Lab: 06/18/21:  Hb 11.2, Platelet 778 07/30/2021: Hemoglobin 12.9, platelets 783, iron saturation 13%, ferritin 7  10/01/2021: Hemoglobin 14.1, platelets 828, ferritin 22 11/02/2021: Hemoglobin 12.9, platelets 435, ferritin 22, iron saturation 54%  04/06/2022: Platelets 763 05/20/2022: Platelets 782, hemoglobin 13.2, iron saturation 64%, ferritin 42, folate 13.3, B12 222 06/09/22: platelets 826 (shingles) 06/21/2022: Platelets 701, iron saturation 66%, ferritin 31 07/07/2022: Platelets 664, ferritin 34, iron saturation 56%, hemoglobin 14.6 08/15/2022: Platelets 857 08/26/2022: Platelets 938, SPEP, KL ratio: Normal, rheumatoid factor: 10: Normal, ANA, SSA SSB: Negative 09/14/2022: Ferritin 30, iron saturation 56%, hemoglobin 13.8, platelets 951   Severe palpitations: Related to anagrelide. D/C anagrelide and her palpitations have mostly resolved.   Plan: Since the patient is intolerant to anagrelide and hydroxyurea, we decided to initiate treatment with Jakafi at the low-dose of 5 mg twice a day.   Patient wants to try phlebotomy x 2 to see if the rash would improve with the phlebotomy. She will stop Jakafi on 09/18/2022.   Chest wall pain  Facial numbness Overview: Should be temporary after face lift March 07 2022   Post herpetic neuralgia Overview:  My clinical judgment, informed by a comprehensive review of all available data (including the AI analysis from [facility])  guides the development of the optimal treatment plan for the patient. While AI insights provide valuable information, they are always interpreted within the context of the patient's unique medical history, presenting complaints, and physical examination findings. Any deviations from AI-generated suggestions are meticulously documented and clearly explained to ensure transparency and optimal patient care. This approach underscores both the patient's well-being and my professional responsibility as their clinician.   AI-Generated Clinical Decision Support Report from [facility] (conducted on 09/26/2022 using de-identified patient data) The Question  72 year old female gets a painful rash in dermatomal distribution over right anterior chest. Last for months. Biopsied by dermatology. Later itched in its course. Got relief from cortisone cream transiently. History of hemochromatosis and JAK2 positive essential thrombocytosis with platelets of 700-900. Could this be hemochromatosis related rash because she feels it looks more like pictures of hemochromatosis rash than shingles.  Design Strategy  To address this question, we will leverage a comprehensive understanding of dermatological manifestations associated with systemic diseases, particularly focusing on hemochromatosis and its potential to cause skin changes. We will also consider the clinical presentation of shingles for differential diagnosis. The strategy involves a systematic analysis of the patient's symptoms, medical history, and biopsy results to determine the most likely cause of the rash.  Execute Strategy  Analyzing the patient's symptoms and medical history: - Dermatomal distribution and painful rash are characteristic of shingles, typically caused by the reactivation of the varicella-zoster virus. - Hemochromatosis can cause skin changes, typically described as a bronzing or grayish pigmentation, rather than a rash in a dermatomal  distribution. - The transient relief from cortisone cream and the itching component are more aligned with an inflammatory or infectious dermatological  condition like shingles rather than hemochromatosis. - The biopsy results would be crucial in confirming the nature of the rash, distinguishing between a viral infection and other dermatological conditions associated with systemic diseases.  Systematically Ensure Accuracy & Precision  Re-evaluating the information, the clinical presentation of a painful, dermatomal rash that responded transiently to cortisone and later developed itching strongly suggests shingles. Hemochromatosis-related skin changes do not typically present as a painful rash in a dermatomal distribution and are more associated with generalized pigmentation changes.  Final Answer  The rash is more likely to be shingles rather than a hemochromatosis-related rash. The presentation of pain, dermatomal distribution, and response to cortisone, along with the evolution of symptoms, aligns more closely with shingles. It is recommended that the patient's management focuses on treatments appropriate for shingles, and continued monitoring of her hemochromatosis and thrombocytosis should be maintained.  >>OVERVIEW FOR CHEST WALL PAIN WRITTEN ON 09/26/2022  3:04 PM BY Lula Olszewski, MD   Neuropathic pain in the chest wall Stays with mother who she cares for.  Pain is over a rash, Urgent Care thought shingles, continues to have pain.  Assessment & Plan: Slowly resolving. She wanted to be reassured that the rash is shingles because prior evaluation was uncertain. I felt confident classify the resolving rash as shingles after comprehensive review of medical findings thus far.  The most convincing data is the time course with persisting marked neuropathic pain, the rash never extending outside of a dermatome, and the biopsy showing dermatitis nonspecific with clinical correlation necessary.   AI analysis drew similar conclusions.  Offered more gabapentin she didn't like Offered Lyrica she feels pain manageable   History of smoking 25-50 pack years -     CT CHEST LUNG CANCER SCREENING LOW DOSE WO CONTRAST; Future    Based on [employer] AI conversational text extraction, alternative Assessment and Plan    Postherpetic Neuralgia: Persistent neuropathic pain in a dermatomal distribution remains following a shingles outbreak, despite the rash resolving. They did not tolerate gabapentin well but find some relief with topical cortisone. We will consider pregabalin (Lyrica) for pain control if the pain becomes unbearable and continue the use of topical cortisone as needed.  Hemochromatosis: They have a history of hemochromatosis and have reported feeling better after the first phlebotomy. Scheduled phlebotomies will continue as planned by Dr. Pamelia Hoit.  Essential Thrombocythemia: With a history of essential thrombocythemia, they have previously been on anagrelide and hydroxyurea but are currently on ruxolitinib Earvin Hansen). Ruxolitinib will be continued as prescribed. Also on aspirin ppx.  Smoking Cessation: They have a significant smoking history but have successfully quit smoking with the help of Chantix. Continued abstinence from smoking is encouraged, and a low-dose CT scan for lung cancer screening is ordered due to their significant smoking history.  General Health Maintenance: A Medicare annual wellness visit will be scheduled.               Clinical Presentation:    Patient affirms intent to establish a primary care provider relationship with Lula Olszewski, MD going forward.  72 y.o. female  with main concerns (chief complaints) today expressed as New Patient (Initial Visit) and Rash (Has faded, constant with redness, upper right breast area, internal shooting pain remains that she thinks is nerve related.)   Discussed the use of AI scribe software for clinical note  transcription with the patient, who gave verbal consent to proceed.  History of Present Illness   The patient, with a known history  of hemochromatosis and JAK2 mutation, presented with a persistent, painful rash on the right lateral chest. The rash, initially diagnosed as shingles by an urgent care provider, was characterized by redness, occasional tiny blisters, and significant internal pain. The patient reported that the pain was not superficial but felt deeper, describing it as a 'creepy' sensation, akin to a caterpillar walking along the skin. The rash was biopsied, revealing spongiotic dermatitis, but no definitive diagnosis was made.  The patient reported a history of smoking two packs a day for approximately 20 years, but quit around 2002. They also mentioned a significant weight gain after quitting smoking. The patient has a history of orthostatic hypotension, which resolved spontaneously a few years ago.  The patient has been on treatment for their hemochromatosis, including regular phlebotomies. However, these were stopped due to anemia issues. The patient reported that the rash seemed to improve significantly after a recent phlebotomy, but the improvement was temporary. The patient also noted that the use of over-the-counter cortisone provided instant relief from the rash.  The patient has been on various medications for their conditions, including anagrelide and hydroxyurea, both of which were discontinued due to side effects. The patient was recently started on ruxolitinib [facility]) for their JAK2 mutation. The patient also reported a history of hair loss while on hydroxyurea.  The patient has a family history of longevity, with their mother currently aged 89. 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 The patient also reported a significant lifestyle change, moving from [place] to [place], and then back to the [place].        Comprehensive  chart development today: Problems:has Benign paroxysmal positional vertigo; Hereditary hemochromatosis (HCC); Essential thrombocytosis (HCC); Nasal obstruction; Facial numbness; Post herpetic neuralgia; and History of smoking 25-50 pack years on their problem list. Current Meds  Medication Sig   aspirin EC 81 MG tablet Take 1 tablet (81 mg total) by mouth daily.   ruxolitinib phosphate (JAKAFI) 5 MG tablet Take 1 tablet (5 mg total) by mouth 2 (two) times daily.   Allergies:  reports that she does not currently use alcohol. Past Medical History  has a past medical history of Allergy (Unknown), Anemia (Unknown), Cataract (One month), Clotting disorder (HCC) (Unknown), Hemochromatosis, Hypertension (Unknown), Orthostatic hypotension (05/20/2016), and Sleep apnea (Unknown). Past Surgical History  has a past surgical history that includes Nasal septum surgery and Cosmetic surgery (Mar 07 2022). Social History  reports that she quit smoking about 2 years ago. Her smoking use included cigarettes. She has a 60.00 pack-year smoking history. She has never used smokeless tobacco. She reports that she does not currently use alcohol. She reports that she does not use drugs. Family History family history includes Acute myelogenous leukemia in her father; Bipolar disorder in her sister; Healthy in her mother.  Depression Screen and Health Maintenance:    09/26/2022    2:19 PM  PHQ 2/9 Scores  PHQ - 2 Score 0   Health Maintenance  Topic Date Due   Medicare Annual Wellness (AWV)  Never done   Pneumonia Vaccine 59+ Years old (1 of 2 - PCV) Never done   Hepatitis C Screening  Never done   DTaP/Tdap/Td (1 - Tdap) Never done   Zoster Vaccines- Shingrix (1 of 2) Never done   Colonoscopy  Never done   Lung Cancer Screening  Never done   MAMMOGRAM  Never done   DEXA SCAN  Never done  COVID-19 Vaccine (1) 04/07/2023 (Originally 12/27/1955)   INFLUENZA VACCINE  11/17/2022   HPV VACCINES  Aged Out    Immunization History  Administered Date(s) Administered   Influenza,inj,Quad PF,6+ Mos 04/28/2016        Objective:  Physical Exam  BP 130/80 (BP Location: Left Arm, Patient Position: Sitting)   Pulse 71   Temp 98.5 F (36.9 C) (Temporal)   Ht 5\' 1"  (1.549 m)   Wt 128 lb 6.4 oz (58.2 kg)   SpO2 98%   BMI 24.26 kg/m  Wt Readings from Last 10 Encounters:  09/26/22 128 lb 6.4 oz (58.2 kg)  09/22/22 129 lb 6.4 oz (58.7 kg)  09/16/22 130 lb 1.6 oz (59 kg)  08/18/22 124 lb 11.2 oz (56.6 kg)  07/21/22 125 lb 4.8 oz (56.8 kg)  07/08/22 122 lb 1.6 oz (55.4 kg)  05/29/22 123 lb 7.3 oz (56 kg)  04/08/22 123 lb 6.4 oz (56 kg)  12/16/21 132 lb 12.8 oz (60.2 kg)  05/11/21 135 lb 8 oz (61.5 kg)   Vital signs reviewed.  Nursing notes reviewed. Weight trend reviewed. Abnormalities and problem-specific physical exam findings:  faint dermatomal right chest wall distribution rash. General Appearance:  Well developed, well nourished, well-groomed, healthy-appearing female with Body mass index is 24.26 kg/m. No acute distress appreciable.   Skin: Clear and well-hydrated. Pulmonary:  Normal work of breathing at rest, no respiratory distress apparent. SpO2: 98 %  Musculoskeletal: She demonstrates smooth and coordinated movements throughout all major joints.All extremities are intact.  Neurological:  Awake, alert, oriented, and engaged.  No obvious focal neurological deficits or cognitive impairments.  Sensorium seems unclouded.  Psychiatric:  Appropriate mood, pleasant and cooperative demeanor, cheerful and engaged during the exam  Reviewed Results    Results for orders placed or performed in visit on 09/22/22  CMP (Cancer Center only)  Result Value Ref Range   Sodium 137 135 - 145 mmol/L   Potassium 4.2 3.5 - 5.1 mmol/L   Chloride 106 98 - 111 mmol/L   CO2 24 22 - 32 mmol/L   Glucose, Bld 98 70 - 99 mg/dL   BUN 12 8 - 23 mg/dL   Creatinine 9.56 2.13 - 1.00 mg/dL   Calcium 9.3 8.9 -  08.6 mg/dL   Total Protein 7.1 6.5 - 8.1 g/dL   Albumin 4.6 3.5 - 5.0 g/dL   AST 18 15 - 41 U/L   ALT 9 0 - 44 U/L   Alkaline Phosphatase 68 38 - 126 U/L   Total Bilirubin 0.4 0.3 - 1.2 mg/dL   GFR, Estimated >57 >84 mL/min   Anion gap 7 5 - 15  CBC with Differential (Cancer Center Only)  Result Value Ref Range   WBC Count 9.8 4.0 - 10.5 K/uL   RBC 4.07 3.87 - 5.11 MIL/uL   Hemoglobin 12.5 12.0 - 15.0 g/dL   HCT 69.6 29.5 - 28.4 %   MCV 90.9 80.0 - 100.0 fL   MCH 30.7 26.0 - 34.0 pg   MCHC 33.8 30.0 - 36.0 g/dL   RDW 13.2 44.0 - 10.2 %   Platelet Count 908 (HH) 150 - 400 K/uL   nRBC 0.0 0.0 - 0.2 %   Neutrophils Relative % 63 %   Neutro Abs 6.3 1.7 - 7.7 K/uL   Lymphocytes Relative 26 %   Lymphs Abs 2.5 0.7 - 4.0 K/uL   Monocytes Relative 7 %   Monocytes Absolute 0.7 0.1 - 1.0 K/uL   Eosinophils  Relative 3 %   Eosinophils Absolute 0.2 0.0 - 0.5 K/uL   Basophils Relative 1 %   Basophils Absolute 0.1 0.0 - 0.1 K/uL   Immature Granulocytes 0 %   Abs Immature Granulocytes 0.03 0.00 - 0.07 K/uL    No results found for any visits on 09/26/22.  Recent Results (from the past 2160 hour(s))  CBC with Differential (Cancer Center Only)     Status: Abnormal   Collection Time: 07/07/22  1:00 PM  Result Value Ref Range   WBC Count 9.3 4.0 - 10.5 K/uL   RBC 4.64 3.87 - 5.11 MIL/uL   Hemoglobin 14.6 12.0 - 15.0 g/dL   HCT 16.1 09.6 - 04.5 %   MCV 93.8 80.0 - 100.0 fL   MCH 31.5 26.0 - 34.0 pg   MCHC 33.6 30.0 - 36.0 g/dL   RDW 40.9 81.1 - 91.4 %   Platelet Count 864 (H) 150 - 400 K/uL   nRBC 0.0 0.0 - 0.2 %   Neutrophils Relative % 71 %   Neutro Abs 6.6 1.7 - 7.7 K/uL   Lymphocytes Relative 20 %   Lymphs Abs 1.9 0.7 - 4.0 K/uL   Monocytes Relative 6 %   Monocytes Absolute 0.5 0.1 - 1.0 K/uL   Eosinophils Relative 3 %   Eosinophils Absolute 0.3 0.0 - 0.5 K/uL   Basophils Relative 0 %   Basophils Absolute 0.0 0.0 - 0.1 K/uL   Immature Granulocytes 0 %   Abs Immature  Granulocytes 0.02 0.00 - 0.07 K/uL    Comment: Performed at [facility], [address], [place], [place] [address]  Ferritin     Status: None   Collection Time: 07/07/22  1:00 PM  Result Value Ref Range   Ferritin 34 11 - 307 ng/mL    Comment: Performed at [facility], [address], [place], [place] [address]  Iron and Iron Binding Capacity ([facility] only)     Status: Abnormal   Collection Time: 07/07/22  1:00 PM  Result Value Ref Range   Iron 170 28 - 170 ug/dL   TIBC 865 784 - 696 ug/dL   Saturation Ratios 56 (H) 10.4 - 31.8 %   UIBC 132 (L) 148 - 442 ug/dL    Comment: Performed at [facility], [address] [place], [place] [address]  CMP (Cancer Center only)     Status: None   Collection Time: 07/15/22 12:19 PM  Result Value Ref Range   Sodium 135 135 - 145 mmol/L   Potassium 4.1 3.5 - 5.1 mmol/L   Chloride 103 98 - 111 mmol/L   CO2 25 22 - 32 mmol/L   Glucose, Bld 94 70 - 99 mg/dL    Comment: Glucose reference range applies only to samples taken after fasting for at least 8 hours.   BUN 12 8 - 23 mg/dL   Creatinine 4.13 2.44 - 1.00 mg/dL   Calcium 9.4 8.9 - 01.0 mg/dL   Total Protein 6.9 6.5 - 8.1 g/dL   Albumin 4.5 3.5 - 5.0 g/dL   AST 16 15 - 41 U/L   ALT 9 0 - 44 U/L   Alkaline Phosphatase 64 38 - 126 U/L   Total Bilirubin 0.4 0.3 - 1.2 mg/dL   GFR, Estimated >27 >25 mL/min    Comment: (NOTE) Calculated using the CKD-EPI Creatinine Equation (2021)    Anion gap 7 5 - 15    Comment: Performed at [facility], [address], [place], [place] [address]  CBC with Differential (Cancer Center Only)     Status: Abnormal   Collection Time: 07/15/22 12:19 PM  Result Value Ref Range   WBC Count 8.1 4.0 - 10.5 K/uL   RBC 4.31 3.87 - 5.11 MIL/uL   Hemoglobin 13.6 12.0 - 15.0 g/dL   HCT 60.4 54.0 - 98.1 %   MCV 92.6 80.0 - 100.0 fL   MCH 31.6 26.0 - 34.0 pg   MCHC 34.1  30.0 - 36.0 g/dL   RDW 19.1 47.8 - 29.5 %   Platelet Count 735 (H) 150 - 400 K/uL   nRBC 0.0 0.0 - 0.2 %   Neutrophils Relative % 65 %   Neutro Abs 5.2 1.7 - 7.7 K/uL   Lymphocytes Relative 24 %   Lymphs Abs 2.0 0.7 - 4.0 K/uL   Monocytes Relative 7 %   Monocytes Absolute 0.6 0.1 - 1.0 K/uL   Eosinophils Relative 3 %   Eosinophils Absolute 0.2 0.0 - 0.5 K/uL   Basophils Relative 1 %   Basophils Absolute 0.0 0.0 - 0.1 K/uL   Immature Granulocytes 0 %   Abs Immature Granulocytes 0.03 0.00 - 0.07 K/uL    Comment: Performed at [facility], [address] [place], [place] [address]  Iron and Iron Binding Capacity ([facility] only)     Status: Abnormal   Collection Time: 08/15/22  4:23 PM  Result Value Ref Range   Iron 143 28 - 170 ug/dL   TIBC 865 784 - 696 ug/dL   Saturation Ratios 47 (H) 10.4 - 31.8 %   UIBC 159 ug/dL    Comment: Performed at [facility], [address], [place], [place] [address]  CBC with Differential (Cancer Center Only)     Status: Abnormal   Collection Time: 08/15/22  4:23 PM  Result Value Ref Range   WBC Count 10.2 4.0 - 10.5 K/uL   RBC 4.45 3.87 - 5.11 MIL/uL   Hemoglobin 13.8 12.0 - 15.0 g/dL   HCT 41.3 24.4 - 01.0 %   MCV 90.3 80.0 - 100.0 fL   MCH 31.0 26.0 - 34.0 pg   MCHC 34.3 30.0 - 36.0 g/dL   RDW 27.2 53.6 - 64.4 %   Platelet Count 857 (H) 150 - 400 K/uL   nRBC 0.0 0.0 - 0.2 %   Neutrophils Relative % 62 %   Neutro Abs 6.3 1.7 - 7.7 K/uL   Lymphocytes Relative 25 %   Lymphs Abs 2.6 0.7 - 4.0 K/uL   Monocytes Relative 7 %   Monocytes Absolute 0.8 0.1 - 1.0 K/uL   Eosinophils Relative 5 %   Eosinophils Absolute 0.5 0.0 - 0.5 K/uL   Basophils Relative 1 %   Basophils Absolute 0.1 0.0 - 0.1 K/uL   Immature Granulocytes 0 %   Abs Immature Granulocytes 0.03 0.00 - 0.07 K/uL    Comment: Performed at [facility], [address], [place], [place] [address]  Ferritin      Status: None   Collection Time: 08/15/22  4:24 PM  Result Value Ref Range   Ferritin 31 11 - 307 ng/mL    Comment: Performed at [facility], [address], [place], [place] [address]  Sjogren's syndrome antibods(ssa + ssb)     Status: None   Collection Time: 08/26/22  3:48 PM  Result Value Ref Range   SSA (Ro) (ENA)  Antibody, IgG <0.2 0.0 - 0.9 AI   SSB (La) (ENA) Antibody, IgG <0.2 0.0 - 0.9 AI    Comment: (NOTE) Performed At: [facility] [address] [place], [place] [id number] Jolene Schimke MD WU:[id number]   CBC with Differential (Cancer Center Only)     Status: Abnormal   Collection Time: 08/26/22  3:48 PM  Result Value Ref Range   WBC Count 10.6 (H) 4.0 - 10.5 K/uL   RBC 4.36 3.87 - 5.11 MIL/uL   Hemoglobin 13.4 12.0 - 15.0 g/dL   HCT 95.6 21.3 - 08.6 %   MCV 91.7 80.0 - 100.0 fL   MCH 30.7 26.0 - 34.0 pg   MCHC 33.5 30.0 - 36.0 g/dL   RDW 57.8 46.9 - 62.9 %   Platelet Count 938 (HH) 150 - 400 K/uL    Comment: This critical result has verified and been called to VAL DODD, RN by Twana First on 05 10 2024 at 1606, and has been read back.    nRBC 0.0 0.0 - 0.2 %   Neutrophils Relative % 60 %   Neutro Abs 6.4 1.7 - 7.7 K/uL   Lymphocytes Relative 24 %   Lymphs Abs 2.6 0.7 - 4.0 K/uL   Monocytes Relative 9 %   Monocytes Absolute 1.0 0.1 - 1.0 K/uL   Eosinophils Relative 6 %   Eosinophils Absolute 0.7 (H) 0.0 - 0.5 K/uL   Basophils Relative 1 %   Basophils Absolute 0.1 0.0 - 0.1 K/uL   Immature Granulocytes 0 %   Abs Immature Granulocytes 0.04 0.00 - 0.07 K/uL    Comment: Performed at [facility], [address], [place], [place] [address]  Kappa/lambda light chains     Status: None   Collection Time: 08/26/22  3:48 PM  Result Value Ref Range   Kappa free light chain 13.5 3.3 - 19.4 mg/L   Lambda free light chains 10.2 5.7 - 26.3 mg/L   Kappa, lambda light chain ratio 1.32 0.26 - 1.65    Comment:  (NOTE) Performed At: [facility] [address] [place], [place] [id number] Jolene Schimke MD OZ:[id number]   Multiple Myeloma Panel (SPEP&IFE w/QIG)     Status: Abnormal   Collection Time: 08/26/22  3:48 PM  Result Value Ref Range   IgG (Immunoglobin G), Serum 640 586 - 1,602 mg/dL   IgA 259 87 - 563 mg/dL   IgM (Immunoglobulin M), Srm 31 26 - 217 mg/dL   Total Protein ELP 6.7 6.0 - 8.5 g/dL   Albumin SerPl Elph-Mcnc 4.4 2.9 - 4.4 g/dL   Alpha 1 0.3 0.0 - 0.4 g/dL   Alpha2 Glob SerPl Elph-Mcnc 0.6 0.4 - 1.0 g/dL   B-Globulin SerPl Elph-Mcnc 1.0 0.7 - 1.3 g/dL   Gamma Glob SerPl Elph-Mcnc 0.5 0.4 - 1.8 g/dL   M Protein SerPl Elph-Mcnc Not Observed Not Observed g/dL   Globulin, Total 2.3 2.2 - 3.9 g/dL   Albumin/Glob SerPl 2.0 (H) 0.7 - 1.7   IFE 1 Comment     Comment: (NOTE) The immunofixation pattern appears unremarkable. Evidence of monoclonal protein is not apparent.    Please Note Comment     Comment: (NOTE) Protein electrophoresis scan will follow via computer, mail, or courier delivery. Performed At: [facility] [address] [place], [place] [id number] Jolene Schimke MD JJ:[id number]   Rheumatoid factor     Status: None   Collection Time: 08/26/22  3:48 PM  Result Value Ref Range  Rheumatoid fact SerPl-aCnc 10.0 <14.0 IU/mL    Comment: (NOTE) Performed At: [facility] [address] [place], [place] [id number] Jolene Schimke MD WU:[id number]   ANA, IFA (with reflex)     Status: None   Collection Time: 08/26/22  3:48 PM  Result Value Ref Range   ANA Ab, IFA Negative     Comment: (NOTE)                                     Negative   <1:80                                     Borderline  1:80                                     Positive   >1:80 ICAP nomenclature: AC-0 For more information about Hep-2 cell patterns use ANApatterns.org, the official website for the International Consensus on Antinuclear Antibody (ANA) Patterns  (ICAP). Performed At: [facility] [address] [place], [place] [id number] Jolene Schimke MD VH:[id number]   CMP (Cancer Center only)     Status: Abnormal   Collection Time: 09/14/22 10:14 AM  Result Value Ref Range   Sodium 139 135 - 145 mmol/L   Potassium 4.1 3.5 - 5.1 mmol/L   Chloride 107 98 - 111 mmol/L   CO2 24 22 - 32 mmol/L   Glucose, Bld 94 70 - 99 mg/dL    Comment: Glucose reference range applies only to samples taken after fasting for at least 8 hours.   BUN 18 8 - 23 mg/dL   Creatinine 4.13 2.44 - 1.00 mg/dL   Calcium 9.3 8.9 - 01.0 mg/dL   Total Protein 7.3 6.5 - 8.1 g/dL   Albumin 4.7 3.5 - 5.0 g/dL   AST 14 (L) 15 - 41 U/L   ALT 8 0 - 44 U/L   Alkaline Phosphatase 79 38 - 126 U/L   Total Bilirubin 0.5 0.3 - 1.2 mg/dL   GFR, Estimated >27 >25 mL/min    Comment: (NOTE) Calculated using the CKD-EPI Creatinine Equation (2021)    Anion gap 8 5 - 15    Comment: Performed at [facility], [address] [place], [place] [address]  CBC with Differential (Cancer Center Only)     Status: Abnormal   Collection Time: 09/14/22 10:14 AM  Result Value Ref Range   WBC Count 9.8 4.0 - 10.5 K/uL   RBC 4.47 3.87 - 5.11 MIL/uL   Hemoglobin 13.8 12.0 - 15.0 g/dL   HCT 03.4 74.2 - 59.5 %   MCV 90.8 80.0 - 100.0 fL   MCH 30.9 26.0 - 34.0 pg   MCHC 34.0 30.0 - 36.0 g/dL   RDW 63.8 75.6 - 43.3 %   Platelet Count 951 (HH) 150 - 400 K/uL    Comment: This critical result has verified and been called to [facility] [place] by Tori Milks on 05 29 2024 at 1048, and has been read back.    nRBC 0.0 0.0 - 0.2 %   Neutrophils Relative % 64 %   Neutro Abs 6.3 1.7 - 7.7 K/uL   Lymphocytes Relative 22 %   Lymphs Abs 2.2 0.7 - 4.0 K/uL  Monocytes Relative 8 %   Monocytes Absolute 0.8 0.1 - 1.0 K/uL   Eosinophils Relative 5 %   Eosinophils Absolute 0.5 0.0 - 0.5 K/uL   Basophils Relative 1 %   Basophils Absolute 0.1 0.0 - 0.1 K/uL   Immature Granulocytes  0 %   Abs Immature Granulocytes 0.04 0.00 - 0.07 K/uL    Comment: Performed at [facility], [address] [place], [place] [address]  Iron and Iron Binding Capacity ([facility] only)     Status: Abnormal   Collection Time: 09/14/22  2:44 PM  Result Value Ref Range   Iron 156 28 - 170 ug/dL   TIBC 604 540 - 981 ug/dL   Saturation Ratios 56 (H) 10.4 - 31.8 %   UIBC 121 (L) 148 - 442 ug/dL    Comment: Performed at [facility], [address], [place], [place] [address]  Ferritin     Status: None   Collection Time: 09/14/22  2:44 PM  Result Value Ref Range   Ferritin 30 11 - 307 ng/mL    Comment: Performed at [facility], [address] [place], [place] [address]  CMP (Cancer Center only)     Status: None   Collection Time: 09/22/22  9:26 AM  Result Value Ref Range   Sodium 137 135 - 145 mmol/L   Potassium 4.2 3.5 - 5.1 mmol/L   Chloride 106 98 - 111 mmol/L   CO2 24 22 - 32 mmol/L   Glucose, Bld 98 70 - 99 mg/dL    Comment: Glucose reference range applies only to samples taken after fasting for at least 8 hours.   BUN 12 8 - 23 mg/dL   Creatinine 2.13 0.86 - 1.00 mg/dL   Calcium 9.3 8.9 - 57.8 mg/dL   Total Protein 7.1 6.5 - 8.1 g/dL   Albumin 4.6 3.5 - 5.0 g/dL   AST 18 15 - 41 U/L   ALT 9 0 - 44 U/L   Alkaline Phosphatase 68 38 - 126 U/L   Total Bilirubin 0.4 0.3 - 1.2 mg/dL   GFR, Estimated >46 >96 mL/min    Comment: (NOTE) Calculated using the CKD-EPI Creatinine Equation (2021)    Anion gap 7 5 - 15    Comment: Performed at [facility], [address], [place], [place] [address]  CBC with Differential (Cancer Center Only)     Status: Abnormal   Collection Time: 09/22/22  9:26 AM  Result Value Ref Range   WBC Count 9.8 4.0 - 10.5 K/uL   RBC 4.07 3.87 - 5.11 MIL/uL   Hemoglobin 12.5 12.0 - 15.0 g/dL   HCT 41.3 24.4 - 01.0 %   MCV 90.9 80.0 - 100.0 fL   MCH 30.7 26.0 -  34.0 pg   MCHC 33.8 30.0 - 36.0 g/dL   RDW 27.2 53.6 - 64.4 %   Platelet Count 908 (HH) 150 - 400 K/uL    Comment: This critical result has verified and been called to Raquel Sarna, RN by Twana First on 06 06 2024 at 0941, and has been read back.    nRBC 0.0 0.0 - 0.2 %   Neutrophils Relative % 63 %   Neutro Abs 6.3 1.7 - 7.7 K/uL   Lymphocytes Relative 26 %   Lymphs Abs 2.5 0.7 - 4.0 K/uL   Monocytes Relative 7 %   Monocytes Absolute 0.7 0.1 - 1.0 K/uL   Eosinophils Relative 3 %  Eosinophils Absolute 0.2 0.0 - 0.5 K/uL   Basophils Relative 1 %   Basophils Absolute 0.1 0.0 - 0.1 K/uL   Immature Granulocytes 0 %   Abs Immature Granulocytes 0.03 0.00 - 0.07 K/uL    Comment: Performed at [facility], [address], [place], [place] [address]    No image results found.   No results found.      Additional notes: Initial Appointment Goals:  This initial visit focused on establishing a foundation for the patient's care. We collaboratively reviewed her medical history and medications in detail, updating the chart as shown in the encounter. Given the extensive information, we prioritized addressing her most pressing concerns, which she reported were: New Patient (Initial Visit) and Rash (Has faded, constant with redness, upper right breast area, internal shooting pain remains that she thinks is nerve related.)  While the complexity of the patient's medical picture may necessitate further evaluation in subsequent visits, we were able to develop a preliminary care plan together. To expedite a comprehensive plan at the next visit, we encouraged the patient to gather relevant medical records from previous providers. This collaborative approach will ensure a more complete understanding of the patient's health and inform the development of a personalized care plan. We look forward to continuing the conversation and working together with the patient on achieving her health  goals.   Collaborative Documentation:  Today's encounter utilized real-time, dynamic patient engagement.  Patients actively participate by directly reviewing and assisting in updating their medical records through a shared screen. This transparency empowers patients to visually confirm chart updates made by the healthcare provider.  This collaborative approach facilitates problem management as we jointly update the problem list, problem overview, and assessment/plan. Ultimately, this process enhances chart accuracy and completeness, fostering shared decision-making, patient education, and informed consent for tests and treatments.  Collaborative Treatment Planning:  Treatment plans were discussed and reviewed in detail.  Explained medication safety and potential side effects.  Encouraged participation and answered all patient questions, confirming understanding and comfort with the plan. Encouraged patient to contact our office if they have any questions or concerns. Agreed on patient returning to office if symptoms worsen, persist, or new symptoms develop. Discussed precautions in case of needing to visit the Emergency Department.  ----------------------------------------------------- Lula Olszewski, MD  09/26/2022 8:55 PM  [facility]:  [phone]

## 2022-09-26 NOTE — Assessment & Plan Note (Addendum)
Slowly resolving. She wanted to be reassured that the rash is shingles because prior evaluation was uncertain. I felt confident classify the resolving rash as shingles after comprehensive review of medical findings thus far.  The most convincing data is the time course with persisting marked neuropathic pain, the rash never extending outside of a dermatome, and the biopsy showing dermatitis nonspecific with clinical correlation necessary.  AI analysis drew similar conclusions.  Offered more gabapentin she didn't like Offered Lyrica she feels pain manageable

## 2022-09-26 NOTE — Patient Instructions (Addendum)
Welcome aboard!   Today's visit was a valuable first step in understanding your health and starting your personalized care journey. We discussed your medical history and medications in detail. Given the extensive information, we prioritized addressing your most pressing concerns.  We understood those concerns to be:  New Patient (Initial Visit) and Rash (Has faded, constant with redness, upper right breast area, internal shooting pain remains that she thinks is nerve related.)   Building a Complete Picture  To create the most effective care plan possible, we may need additional information from previous providers. We encouraged you to gather any relevant medical records for your next visit. This will help [place] build a more complete picture and develop a personalized plan together. In the meantime, we'll address your immediate concerns and provide resources to help you manage all of your medical issues.  We encourage you to use MyChart to review these efforts, and to help [place] find and correct any omissions or errors in your medical chart.  Managing Your Health Over Time  Managing every aspect of your health in a single visit isn't always feasible, but that's okay.  We addressed your most pressing concerns today and charted a course for future care. Acute conditions or preventive care measures may require further attention.  We encourage you to schedule a follow-up visit at your earliest convenience to discuss any unresolved issues.  We strongly encourage participation in annual preventive care visits to help [place] develop a more thorough understanding of your health and to help you maintain optimal wellness - please inquire about scheduling your next one with [place] at your earliest convenience.  Your Satisfaction Matters  It was a pleasure seeing you today!  Your health and satisfaction will always be my top priorities. If you believe your experience today was worthy of a 5-star rating, I'd be grateful for your  feedback!  Lula Olszewski, MD   Next Steps  Schedule Follow-Up:  We recommend a follow-up appointment in No follow-ups on file. If your condition worsens before then, please call us or seek emergency care. Preventive Care:  Don't forget to schedule your annual preventive care visit!  This important checkup is typically covered by insurance and helps identify potential health issues early.  Typically its 100% insurance covered with no co-pay and helps to get surveillance labwork paid for through your insurance provider.  Sometimes it even lowers your insurance premiums to participate. Medical Information Release:  For any relevant medical information we don't have, please sign a release form so we can obtain it for your records. Lab & X-ray Appointments:  Scheduled any incomplete lab tests today or call us to schedule.  X-Rays can be done without an appointment at [facility] ([address]), M-F 8:30am-noon or 1pm-5pm.  Just tell them you're there for X-rays ordered by Dr. Jon Billings.  We'll receive the results and contact you by phone or MyChart to discuss next steps.  Bring to Your Next Appointment  Medications: Please bring all your medication bottles to your next appointment to ensure we have an accurate record of your prescriptions. Health Diaries: If you're monitoring any health conditions at home, keeping a diary of your readings can be very helpful for discussions at your next appointment.   VISIT SUMMARY:  During your visit, we discussed your persistent, painful rash on the right lateral chest, which was initially diagnosed as shingles. We also discussed your known history of hemochromatosis and JAK2 mutation, and your  previous smoking habit. We reviewed your current medications and treatment plans, and discussed your general health maintenance.  YOUR PLAN:  -POSTHERPETIC NEURALGIA: This is a condition where nerve pain persists after a shingles outbreak.  We will continue using topical cortisone for pain relief and may consider a medication called pregabalin (Lyrica) if the pain becomes unbearable.  -HEMOCHROMATOSIS: This is a condition where your body absorbs too much iron from the food you eat. We will continue with the scheduled phlebotomies (blood removal) as planned by Dr. Georgiann Mohs, as you reported feeling better after these procedures.  -ESSENTIAL THROMBOCYTHEMIA: This is a condition where your body produces too many platelets, which can lead to blood clots. You are currently on a medication called ruxolitinib [facility]) for this condition, which we will continue as prescribed.  -SMOKING CESSATION: You have successfully quit smoking, which is excellent for your health. We encourage you to continue abstaining from smoking. Due to your significant smoking history, we have ordered a low-dose CT scan to screen for lung cancer.  INSTRUCTIONS:  We will schedule a Medicare annual wellness visit for you. We will also continue to monitor your cholesterol, ferritin, and iron levels as currently managed by Dr. Georgiann Mohs. Please continue to take your medications as prescribed and follow the treatment plans discussed during your visit.  Reviewing Your Records  Please Review this early draft of your clinical notes below and the final encounter summary tomorrow on MyChart after its been completed.   Colon cancer screening -     Cologuard  Essential thrombocytosis (HCC)  Chest wall pain  Facial numbness  Post herpetic neuralgia Assessment & Plan: Slowly resolving. She wanted to be reassured that the rash is shingles because prior evaluation was uncertain. I felt confident classify the resolving rash as shingles after comprehensive review of medical findings thus far.  The most convincing data is the time course with persisting marked neuropathic pain, the rash never extending outside of a dermatome, and the biopsy showing dermatitis nonspecific with  clinical correlation necessary.  AI analysis drew similar conclusions.  Offered more gabapentin she didn't like Offered Lyrica she feels pain manageable   History of smoking 25-50 pack years -     CT CHEST LUNG CANCER SCREENING LOW DOSE WO CONTRAST; Future     Getting Answers and Following Up  Simple Questions & Concerns: For quick questions or basic follow-up after your visit, reach [place] at [phone] or MyChart messaging. Complex Concerns: If your concern is more complex, scheduling an appointment might be best. Discuss this with the staff to find the most suitable option. Lab & Imaging Results: We'll contact you directly if results are abnormal or you don't use MyChart. Most normal results will be on MyChart within 2-3 business days, with a review message from Dr. Jon Billings. Haven't heard back in 2 weeks? Need results sooner? Contact us at [phone]. Referrals: Our referral coordinator will manage specialist referrals. The specialist's office should contact you within 2 weeks to schedule an appointment. Call us if you haven't heard from them after 2 weeks.  Staying Connected  MyChart: Activate your MyChart for the fastest way to access results and message [place]. See the last page of this paperwork for instructions.  Billing  X-ray & Lab Orders: These are billed by separate companies. Contact the invoicing company directly for questions or concerns. Visit Charges: Discuss any billing inquiries with our administrative services team.  Feedback & Satisfaction  Share Your Experience: We strive for your satisfaction! If  you have any complaints, please let Dr. Jon Billings know directly or contact our Practice Administrators, Edwena Felty or [employer], by asking at the front desk.  Scheduling Tips  Shorter Wait Times: 8 am and 1 pm appointments often have the quickest wait times. Longer Appointments: If you need more time during your visit, talk to the front desk. Due to insurance  regulations, multiple back-to-back appointments might be necessary.

## 2022-09-26 NOTE — Telephone Encounter (Signed)
Pt called with concern that rash has still not recovered. She asks if this could possibly be breast cancer . MD reassured her that he does not think this rash is cancer related since derm dx was negative.  She is seeing a PCP today. Recommended pt to ask for screening MM as she reports she has not had one in years. She verbalized understanding and agreement.

## 2022-10-04 DIAGNOSIS — M9904 Segmental and somatic dysfunction of sacral region: Secondary | ICD-10-CM | POA: Diagnosis not present

## 2022-10-04 DIAGNOSIS — M5136 Other intervertebral disc degeneration, lumbar region: Secondary | ICD-10-CM | POA: Diagnosis not present

## 2022-10-04 DIAGNOSIS — M9905 Segmental and somatic dysfunction of pelvic region: Secondary | ICD-10-CM | POA: Diagnosis not present

## 2022-10-04 DIAGNOSIS — M9903 Segmental and somatic dysfunction of lumbar region: Secondary | ICD-10-CM | POA: Diagnosis not present

## 2022-10-10 ENCOUNTER — Telehealth: Payer: Self-pay

## 2022-10-10 NOTE — Telephone Encounter (Signed)
Pt called and states she would like to speak with MD regarding possible side effects from [facility]. She states her rash has subsided but the numbness and tingling in her face and left hand has worsened. She would like to know if this is a sx from the medication.  She also reports she had a face lift in November 2023as well as botox injections to her face in [place].  Pt's appt for 7/3 was r/s to 6/26 with MD to further eval.

## 2022-10-11 NOTE — Progress Notes (Signed)
   Patient Care Team: Lula Olszewski, MD as PCP - General (Internal Medicine) Serena Croissant, MD as Consulting Physician (Hematology and Oncology) Bensimhon, Bevelyn Buckles, MD as Consulting Physician (Cardiology)  DIAGNOSIS: No diagnosis found.  SUMMARY OF ONCOLOGIC HISTORY: Oncology History   No history exists.    CHIEF COMPLIANT:  thrombocytosis and hereditary hemochromatosis     INTERVAL HISTORY: Kara [name] is a 72 y.o. female with a history of thrombocytosis and hereditary hemochromatosis. She presents to the clinic for a follow-up.    ALLERGIES:  is allergic to penicillins and tramadol.  MEDICATIONS:  Current Outpatient Medications  Medication Sig Dispense Refill   aspirin EC 81 MG tablet Take 1 tablet (81 mg total) by mouth daily.     ruxolitinib phosphate (JAKAFI) 5 MG tablet Take 1 tablet (5 mg total) by mouth 2 (two) times daily. 60 tablet 3   No current facility-administered medications for this visit.    PHYSICAL EXAMINATION: ECOG PERFORMANCE STATUS: {CHL ONC ECOG PS:[id number]}  There were no vitals filed for this visit. There were no vitals filed for this visit.  BREAST:*** No palpable masses or nodules in either right or left breasts. No palpable axillary supraclavicular or infraclavicular adenopathy no breast tenderness or nipple discharge. (exam performed in the presence of a chaperone)  LABORATORY DATA:  I have reviewed the data as listed    Latest Ref Rng & Units 09/22/2022    9:26 AM 09/14/2022   10:14 AM 07/15/2022   12:19 PM  CMP  Glucose 70 - 99 mg/dL 98  94  94   BUN 8 - 23 mg/dL 12  18  12    Creatinine 0.44 - 1.00 mg/dL 4.69  6.29  5.28   Sodium 135 - 145 mmol/L 137  139  135   Potassium 3.5 - 5.1 mmol/L 4.2  4.1  4.1   Chloride 98 - 111 mmol/L 106  107  103   CO2 22 - 32 mmol/L 24  24  25    Calcium 8.9 - 10.3 mg/dL 9.3  9.3  9.4   Total Protein 6.5 - 8.1 g/dL 7.1  7.3  6.9   Total Bilirubin 0.3 - 1.2 mg/dL 0.4  0.5  0.4   Alkaline Phos 38 -  126 U/L 68  79  64   AST 15 - 41 U/L 18  14  16    ALT 0 - 44 U/L 9  8  9      Lab Results  Component Value Date   WBC 9.8 09/22/2022   HGB 12.5 09/22/2022   HCT 37.0 09/22/2022   MCV 90.9 09/22/2022   PLT 908 (HH) 09/22/2022   NEUTROABS 6.3 09/22/2022    ASSESSMENT & PLAN:  No problem-specific Assessment & Plan notes found for this encounter.    No orders of the defined types were placed in this encounter.  The patient has a good understanding of the overall plan. she agrees with it. she will call with any problems that may develop before the next visit here. Total time spent: 30 mins including face to face time and time spent for planning, charting and co-ordination of care   Sherlyn Lick, CMA 10/11/22    I Janan Ridge am acting as a Neurosurgeon for [employer]  ***

## 2022-10-12 ENCOUNTER — Inpatient Hospital Stay (HOSPITAL_BASED_OUTPATIENT_CLINIC_OR_DEPARTMENT_OTHER): Payer: Medicare Other | Admitting: Hematology and Oncology

## 2022-10-12 ENCOUNTER — Inpatient Hospital Stay: Payer: Medicare Other

## 2022-10-12 ENCOUNTER — Other Ambulatory Visit: Payer: Self-pay

## 2022-10-12 VITALS — BP 158/86 | HR 82 | Temp 97.7°F | Resp 18 | Ht 61.0 in | Wt 131.8 lb

## 2022-10-12 DIAGNOSIS — R21 Rash and other nonspecific skin eruption: Secondary | ICD-10-CM | POA: Diagnosis not present

## 2022-10-12 DIAGNOSIS — D473 Essential (hemorrhagic) thrombocythemia: Secondary | ICD-10-CM

## 2022-10-12 DIAGNOSIS — D75839 Thrombocytosis, unspecified: Secondary | ICD-10-CM | POA: Diagnosis not present

## 2022-10-12 LAB — CBC WITH DIFFERENTIAL (CANCER CENTER ONLY)
Abs Immature Granulocytes: 0.02 10*3/uL (ref 0.00–0.07)
Basophils Absolute: 0.1 10*3/uL (ref 0.0–0.1)
Basophils Relative: 1 %
Eosinophils Absolute: 0.3 10*3/uL (ref 0.0–0.5)
Eosinophils Relative: 3 %
HCT: 34.9 % — ABNORMAL LOW (ref 36.0–46.0)
Hemoglobin: 11.7 g/dL — ABNORMAL LOW (ref 12.0–15.0)
Immature Granulocytes: 0 %
Lymphocytes Relative: 27 %
Lymphs Abs: 2.7 10*3/uL (ref 0.7–4.0)
MCH: 31.1 pg (ref 26.0–34.0)
MCHC: 33.5 g/dL (ref 30.0–36.0)
MCV: 92.8 fL (ref 80.0–100.0)
Monocytes Absolute: 0.6 10*3/uL (ref 0.1–1.0)
Monocytes Relative: 6 %
Neutro Abs: 6.3 10*3/uL (ref 1.7–7.7)
Neutrophils Relative %: 63 %
Platelet Count: 769 10*3/uL — ABNORMAL HIGH (ref 150–400)
RBC: 3.76 MIL/uL — ABNORMAL LOW (ref 3.87–5.11)
RDW: 15.3 % (ref 11.5–15.5)
WBC Count: 10.1 10*3/uL (ref 4.0–10.5)
nRBC: 0 % (ref 0.0–0.2)

## 2022-10-12 LAB — CMP (CANCER CENTER ONLY)
ALT: 9 U/L (ref 0–44)
AST: 17 U/L (ref 15–41)
Albumin: 4.4 g/dL (ref 3.5–5.0)
Alkaline Phosphatase: 70 U/L (ref 38–126)
Anion gap: 8 (ref 5–15)
BUN: 16 mg/dL (ref 8–23)
CO2: 25 mmol/L (ref 22–32)
Calcium: 9.4 mg/dL (ref 8.9–10.3)
Chloride: 105 mmol/L (ref 98–111)
Creatinine: 0.63 mg/dL (ref 0.44–1.00)
GFR, Estimated: >60 mL/min (ref >60)
Glucose, Bld: 111 mg/dL — ABNORMAL HIGH (ref 70–99)
Potassium: 4.2 mmol/L (ref 3.5–5.1)
Sodium: 138 mmol/L (ref 135–145)
Total Bilirubin: 0.3 mg/dL (ref 0.3–1.2)
Total Protein: 7.1 g/dL (ref 6.5–8.1)

## 2022-10-12 LAB — IRON AND IRON BINDING CAPACITY (CC-WL,HP ONLY)
Iron: 122 ug/dL (ref 28–170)
Saturation Ratios: 39 % — ABNORMAL HIGH (ref 10.4–31.8)
TIBC: 315 ug/dL (ref 250–450)
UIBC: 193 ug/dL (ref 148–442)

## 2022-10-12 LAB — FERRITIN: Ferritin: 29 ng/mL (ref 11–307)

## 2022-10-12 NOTE — Assessment & Plan Note (Signed)
Lab: 06/18/21:  Hb 11.2, Platelet 778 07/30/2021: Hemoglobin 12.9, platelets 783, iron saturation 13%, ferritin 7  10/01/2021: Hemoglobin 14.1, platelets 828, ferritin 22 11/02/2021: Hemoglobin 12.9, platelets 435, ferritin 22, iron saturation 54%  04/06/2022: Platelets 763 05/20/2022: Platelets 782, hemoglobin 13.2, iron saturation 64%, ferritin 42, folate 13.3, B12 222 06/09/22: platelets 826 (shingles) 06/21/2022: Platelets 701, iron saturation 66%, ferritin 31 07/07/2022: Platelets 664, ferritin 34, iron saturation 56%, hemoglobin 14.6 08/15/2022: Platelets 857 08/26/2022: Platelets 938, SPEP, KL ratio: Normal, rheumatoid factor: 10: Normal, ANA, SSA SSB: Negative 09/14/2022: Ferritin 30, iron saturation 56%, hemoglobin 13.8, platelets 951   Severe palpitations: Related to anagrelide. D/C anagrelide and her palpitations have mostly resolved.   Current treatment: Since the patient is intolerant to anagrelide and hydroxyurea, we decided to initiate treatment with Jakafi at the low-dose of 5 mg twice a day.  Started 09/18/2022   Rash: Remarkable improvement in the rash since she is doing phlebotomy.   Jakafi toxicities: Denies any major adverse effects to [facility]   Will plan to do monthly phlebotomy treatments x 3.  I will see the patient back with labs and phlebotomy every month.

## 2022-10-14 DIAGNOSIS — H02831 Dermatochalasis of right upper eyelid: Secondary | ICD-10-CM | POA: Diagnosis not present

## 2022-10-14 DIAGNOSIS — R238 Other skin changes: Secondary | ICD-10-CM | POA: Insufficient documentation

## 2022-10-14 DIAGNOSIS — H02834 Dermatochalasis of left upper eyelid: Secondary | ICD-10-CM | POA: Diagnosis not present

## 2022-10-14 DIAGNOSIS — H0289 Other specified disorders of eyelid: Secondary | ICD-10-CM | POA: Diagnosis not present

## 2022-10-19 ENCOUNTER — Inpatient Hospital Stay: Payer: Medicare Other | Admitting: Hematology and Oncology

## 2022-10-19 ENCOUNTER — Inpatient Hospital Stay: Payer: Medicare Other

## 2022-10-21 ENCOUNTER — Telehealth: Payer: Self-pay

## 2022-10-21 ENCOUNTER — Other Ambulatory Visit: Payer: Self-pay | Admitting: *Deleted

## 2022-10-21 ENCOUNTER — Inpatient Hospital Stay: Payer: Medicare Other

## 2022-10-21 NOTE — Telephone Encounter (Signed)
Called pt to advise we will be cancelling therapeurtic phlebotomy today d/t parameters. Pt verbalized understanding and knows to call should she need lab appt before 8/2.

## 2022-10-26 DIAGNOSIS — Z1211 Encounter for screening for malignant neoplasm of colon: Secondary | ICD-10-CM | POA: Diagnosis not present

## 2022-11-03 ENCOUNTER — Telehealth: Payer: Medicare Other | Admitting: Internal Medicine

## 2022-11-03 ENCOUNTER — Ambulatory Visit: Payer: Medicare Other

## 2022-11-03 ENCOUNTER — Encounter: Payer: Self-pay | Admitting: Internal Medicine

## 2022-11-03 ENCOUNTER — Other Ambulatory Visit: Payer: Self-pay | Admitting: Internal Medicine

## 2022-11-03 DIAGNOSIS — R5381 Other malaise: Secondary | ICD-10-CM | POA: Insufficient documentation

## 2022-11-03 DIAGNOSIS — D473 Essential (hemorrhagic) thrombocythemia: Secondary | ICD-10-CM | POA: Diagnosis not present

## 2022-11-03 DIAGNOSIS — Z87891 Personal history of nicotine dependence: Secondary | ICD-10-CM

## 2022-11-03 DIAGNOSIS — R21 Rash and other nonspecific skin eruption: Secondary | ICD-10-CM | POA: Diagnosis not present

## 2022-11-03 DIAGNOSIS — B0229 Other postherpetic nervous system involvement: Secondary | ICD-10-CM

## 2022-11-03 DIAGNOSIS — Z780 Asymptomatic menopausal state: Secondary | ICD-10-CM

## 2022-11-03 DIAGNOSIS — R195 Other fecal abnormalities: Secondary | ICD-10-CM

## 2022-11-03 DIAGNOSIS — L089 Local infection of the skin and subcutaneous tissue, unspecified: Secondary | ICD-10-CM | POA: Diagnosis not present

## 2022-11-03 DIAGNOSIS — R61 Generalized hyperhidrosis: Secondary | ICD-10-CM | POA: Diagnosis not present

## 2022-11-03 LAB — COLOGUARD: COLOGUARD: POSITIVE — AB

## 2022-11-03 MED ORDER — DOXYCYCLINE HYCLATE 100 MG PO TABS
100.0000 mg | ORAL_TABLET | Freq: Two times a day (BID) | ORAL | 0 refills | Status: DC
Start: 2022-11-03 — End: 2022-11-24

## 2022-11-03 NOTE — Assessment & Plan Note (Signed)
high platelet count is managed with Jakafi, and recent labs showed a decrease in platelet count. We will continue Jakafi as prescribed by Oncology.    I reassured her that there is no evidence that her rash and malaise and night sweats are part of a lymphoma disorder(s). Will share that she has this concern(s) with oncology.

## 2022-11-03 NOTE — Progress Notes (Signed)
Notify patient:  her Cologuard testing was positive - possible causes are:        - Precancerous growths (polyps) - (95%) These abnormal tissue changes may become cancer if left untreated.       - Colorectal cancer (less common) - This is found in only 4% of positive results, and is usually still curable at time of discovery.       - Sometimes the test can produce a false result.  My very STRONG recommendation is to schedule a colonoscopy - so I've already initiated a referral for that.   Inform her that if she doesn't hear from colonoscopy scheduling within 1-2 weeks, call us back to check the status of the referral.  If patient not agreeable to colonoscopy, then see if she would be willing to schedule an appointment to discuss it further with me.  If not agreeable to that either, then send task back to me so I can give her a call about it.    Kara Olszewski, MD  11/03/2022 9:02 PM

## 2022-11-03 NOTE — Progress Notes (Signed)
[facility]: [phone]   Virtual Medical Office Visit - Video Telemedicine   Patient:  Kara [name] (1950/07/10)  MRN:   [id number]      Date:   11/03/2022  Patient Care Team: Lula Olszewski, MD as PCP - General (Internal Medicine) Serena Croissant, MD as Consulting Physician (Hematology and Oncology) Bensimhon, Bevelyn Buckles, MD as Consulting Physician (Cardiology) Today's Healthcare Provider: Lula Olszewski, MD   Assessment & Plan    Barabara was seen today for discuss concerns and lung screening, rash, fatigue and night sweats.  Malaise Overview: Feels rotten all the time consistently since shingles early February 2024- thought it was COVID initially.   Essential thrombocytosis (HCC) Overview: Oncology: Dr. Pamelia Hoit 10/12/22 visit ASSESSMENT & PLAN:  Essential thrombocytosis (HCC) Lab: 06/18/21:  Hb 11.2, Platelet 778 07/30/2021: Hemoglobin 12.9, platelets 783, iron saturation 13%, ferritin 7  10/01/2021: Hemoglobin 14.1, platelets 828, ferritin 22 11/02/2021: Hemoglobin 12.9, platelets 435, ferritin 22, iron saturation 54%  04/06/2022: Platelets 763 05/20/2022: Platelets 782, hemoglobin 13.2, iron saturation 64%, ferritin 42, folate 13.3, B12 222 06/09/22: platelets 826 (shingles) 06/21/2022: Platelets 701, iron saturation 66%, ferritin 31 07/07/2022: Platelets 664, ferritin 34, iron saturation 56%, hemoglobin 14.6 08/15/2022: Platelets 857 08/26/2022: Platelets 938, SPEP, KL ratio: Normal, rheumatoid factor: 10: Normal, ANA, SSA SSB: Negative 09/14/2022: Ferritin 30, iron saturation 56%, hemoglobin 13.8, platelets 951   Severe palpitations: Related to anagrelide. D/C anagrelide and her palpitations have mostly resolved.   Current treatment: Since the patient is intolerant to anagrelide and hydroxyurea, we decided to initiate treatment with Jakafi at the low-dose of 5 mg twice a day.  Started 09/18/2022   Rash: Remarkable improvement in the rash since she is doing  phlebotomy.   Jakafi toxicities: Denies any major adverse effects to [facility]   Will plan to do monthly phlebotomy treatments x 3.  I will see the patient back with labs and phlebotomy every month.      Assessment & Plan: high platelet count is managed with Jakafi, and recent labs showed a decrease in platelet count. We will continue Jakafi as prescribed by Oncology.    I reassured her that there is no evidence that her rash and malaise and night sweats are part of a lymphoma disorder(s). Will share that she has this concern(s) with oncology.   Post herpetic neuralgia Overview:  painful rash in dermatomal distribution over right anterior chest from 05/2022 to 09/2022.Marland Kitchen Biopsied but results not specific.  Later itched in its course. Got relief from cortisone cream transiently.  Dermatomal distribution and painful rash are characteristic of shingles, typically caused by the reactivation of the varicella-zoster virus. Hemochromatosis can cause skin changes, typically described as a bronzing or grayish pigmentation, rather than a rash in a dermatomal distribution.- The transient relief from cortisone cream and the itching component are more aligned with an inflammatory or infectious dermatological condition like shingles rather than hemochromatosis. - The biopsy results would be crucial in confirming the nature of the rash, distinguishing between a viral infection and other dermatological conditions associated with systemic diseases.   Night sweats Overview: Associated with JAK2 positive thrombocytosis and hemochromatosis. Mostly over neck Has tried environmental changes with minimal benefits. Started a long time before Jakafi  Assessment & Plan: Night Sweats: They experience chronic, almost nightly excessive sweating on the neck without a clear cause, though it may relate to their high platelet count. We will continue monitoring symptoms.  I considered this maybe as a chronic occult infection(s)  which contributed to my decision to try empiric doxycycline today.   History of smoking 25-50 pack years Overview: planning with pulmonary about LDCT screening.  Orders: -     HM DEXA SCAN  Skin infection -     Doxycycline Hyclate; Take 1 tablet (100 mg total) by mouth 2 (two) times daily.  Dispense: 20 tablet; Refill: 0  Skin rash Overview: Was attributed to shingles but diagnosis is uncertain.  Persist pain is similar to postherpetic neuralgia and shape is dermatomal, but its beeen associated with persistent fatigue. Biopsy chest x 2 for erosions and telangiectasia show   Assessment & Plan: Persistent Skin Rash: Initially diagnosed as shingles, the rash has an atypical presentation and persists despite antiviral treatment, with biopsy results inconclusive. A persistent irritated spot at one of the biopsy sites suggests a possible infection. We will start Doxycycline for 10 days to cover potential skin infection, sinus infection, and tick-borne diseases, and request an upload of a picture of the skin spot for further evaluation.   Post-menopausal -     HM DEXA SCAN    Encouraged patient to follow up 2 weeks to assess response to doxycycline.  Treatment plan discussed and reviewed in detail. Explained medication safety and potential side effects.  Answered all patient questions and confirmed understanding and comfort with the plan. Encouraged patient to contact our office if they have any questions or concerns.   Subjective:   Chief Complaint / Reason for Visit:  Discuss concerns and lung screening, Rash, Fatigue, and Night Sweats   72 y.o. female  has a past medical history of Allergy (Unknown), Anemia (Unknown), Cataract (One month), Clotting disorder (HCC) (Unknown), Hemochromatosis, Hypertension (Unknown), Orthostatic hypotension (05/20/2016), and Sleep apnea (Unknown).   Discussed the use of AI scribe software for clinical note transcription with the patient, who gave verbal  consent to proceed.  History of Present Illness   The patient, with a history of hemochromatosis, high platelets, and a recent diagnosis of shingles, presents with persistent symptoms of the shingles rash and associated discomfort. The patient reports the onset of symptoms around February 11th, with a preceding week or two of feeling unwell, initially suspected to be COVID-19. The rash, initially presenting as five or six pinhead-sized spots filled with pus, was diagnosed as shingles at an urgent care visit. The patient reports that the rash did not present as typical shingles, with minimal blistering and no oozing. Despite treatment with an antiviral, the rash persisted for six weeks, leading to a dermatology consultation and biopsy. The biopsy results were indeterminate, with a differential diagnosis including an inflammatory stage of scleroderma, resolving hypersensitivity reaction, or an unusual spongiotic dermatitis.  The patient also reports experiencing night sweats on the neck, occurring almost every night, with some nights more intense than others. This symptom predates both the shingles and the initiation of Jacafi for the high platelets. The patient has attempted to mitigate this symptom by changing to natural fiber bedding and cotton sleepwear, with no improvement noted.  The patient also reports a persistent feeling of malaise since the onset of the shingles, describing it as feeling "rotten." The patient has also noted a persistent spot of irritation on the chest, where one of the biopsy sites was located. This spot has not healed as expected, remaining slightly red and painful. The patient reports no improvement in the night sweats despite a decrease in platelet count with Jacafi treatment.        Reviewed chart data: has Benign  paroxysmal positional vertigo; Hereditary hemochromatosis (HCC); Essential thrombocytosis (HCC); Nasal obstruction; Facial numbness; Post herpetic neuralgia;  History of smoking 25-50 pack years; Night sweats; Malaise; Intrinsic aging of facial skin; and Skin rash on their problem list. aspirin EC, doxycycline, and ruxolitinib phosphate        Objective:  Physical Exam         Video visit makes it hard to see the rash, she will try to upload it.  General Appearance:  Well Developed, Well Nourished, No Acute Distress by Limited Video Assessment Pulmonary:  No Respiratory Distress Apparent. Normal Work of Breathing.   Neurological:  Awake, Alert. No Obvious Focal Neurological Deficits or Cognitive Impairments.  Sensorium Seems Unclouded. Psychiatric:  Appropriate Mood, Pleasant Demeanor, Calm, Articulate, Good Mood  Results Reviewed:          ------------------------------------------------------ Attestation:  Today's Healthcare Provider Lula Olszewski, MD was located at office at [facility] [address], [place] [address]. The patient was located at home. Today's Telemedicine visit was conducted via Video for 37m 39s after consent for telemedicine was obtained:  Video connection was lost when more than 50% of the duration of the visit was complete, at which time the remainder of the visit was completed via audio only.   All video encounter participant identities and locations confirmed visually and verbally.  Signed: Lula Olszewski, MD 11/03/2022 9:19 AM

## 2022-11-03 NOTE — Patient Instructions (Addendum)
VISIT SUMMARY:  During your recent visit, we discussed your ongoing symptoms related to your shingles rash, night sweats, high platelet count, and hemochromatosis. We also discussed your general health maintenance, including lung cancer screening.  YOUR PLAN:  -PERSISTENT SKIN RASH: Your skin rash, initially thought to be shingles, is not responding as expected to treatment. We suspect a possible skin infection at one of the biopsy sites. We will start you on a course of Doxycycline, an antibiotic, for 10 days to treat potential skin and sinus infections, as well as tick-borne diseases. Please upload a picture of the skin spot for further evaluation.  -NIGHT SWEATS: You've been experiencing excessive sweating on your neck almost every night. The cause is unclear, but it may be related to your high platelet count. We will continue to monitor this symptom.  -THROMBOCYTOSIS: Thrombocytosis is a condition where your body produces too many platelets. You're currently on Jakafi for this, and recent tests show a decrease in your platelet count. We will continue with this treatment.  -HEMOCHROMATOSIS: Hemochromatosis is a genetic condition that causes your body to absorb too much iron from your diet. You didn't mention any current symptoms related to this condition, so we will continue with the current management plan.  -GENERAL HEALTH MAINTENANCE: You're currently undergoing screening for lung cancer. While it's unlikely that you have lung cancer, the screening may help identify other potential causes for your symptoms.  INSTRUCTIONS:  Please start taking the prescribed Doxycycline and continue with your current medications. Remember to upload a picture of the skin spot for further evaluation. We will continue to monitor your symptoms and adjust your treatment plan as necessary. Please reach out if you have any questions or concerns.   It was a pleasure seeing you today! Your health and satisfaction  are our top priorities.  Glenetta Hew, MD  Your Providers PCP: Lula Olszewski, MD,  [phone]) Referring Provider: Lula Olszewski, MD,  [phone]) Care Team Provider: Serena Croissant, MD,  [phone]) Care Team Provider: Dolores Patty, MD,  [phone])     NEXT STEPS: [x]  Early Intervention: Schedule sooner appointment, call our on-call services, or go to emergency room if there is any significant Increase in pain or discomfort New or worsening symptoms Sudden or severe changes in your health [x]  Flexible Follow-Up: We recommend a No follow-ups on file. for optimal routine care. This allows for progress monitoring and treatment adjustments. [x]  Preventive Care: Schedule your annual preventive care visit! It's typically covered by insurance and helps identify potential health issues early. [x]  Lab & X-ray Appointments: Incomplete tests scheduled today, or call to schedule. X-rays: [facility] (M-F, 8:30am-noon or 1pm-5pm). [x]  Medical Information Release: Sign a release form at front desk to obtain relevant medical information we don't have.  MAKING THE MOST OF OUR FOCUSED 20 MINUTE APPOINTMENTS: [x]   Clearly state your top concerns at the beginning of the visit to focus our discussion [x]   If you anticipate you will need more time, please inform the front desk during scheduling - we can book multiple appointments in the same week. [x]   If you have transportation problems- use our convenient video appointments or ask about transportation support. [x]   We can get down to business faster if you use MyChart to update information before the visit and submit non-urgent questions before your visit. Thank you for taking the time to provide details through MyChart.  Let our nurse know and she can import this information into your  encounter documents.  Arrival and Wait Times: [x]   Arriving on time ensures that everyone receives prompt attention. [x]   Early  morning (8a) and afternoon (1p) appointments tend to have shortest wait times. [x]   Unfortunately, we cannot delay appointments for late arrivals or hold slots during phone calls.  Getting Answers and Following Up [x]   Simple Questions & Concerns: For quick questions or basic follow-up after your visit, reach [place] at [phone] or MyChart messaging. [x]   Complex Concerns: If your concern is more complex, scheduling an appointment might be best. Discuss this with the staff to find the most suitable option. [x]   Lab & Imaging Results: We'll contact you directly if results are abnormal or you don't use MyChart. Most normal results will be on MyChart within 2-3 business days, with a review message from Dr. Jon Billings. Haven't heard back in 2 weeks? Need results sooner? Contact us at [phone]. [x]   Referrals: Our referral coordinator will manage specialist referrals. The specialist's office should contact you within 2 weeks to schedule an appointment. Call us if you haven't heard from them after 2 weeks.  Staying Connected [x]   MyChart: Activate your MyChart for the fastest way to access results and message [place]. See the last page of this paperwork for instructions on how to activate.  Bring to Your Next Appointment [x]   Medications: Please bring all your medication bottles to your next appointment to ensure we have an accurate record of your prescriptions. [x]   Health Diaries: If you're monitoring any health conditions at home, keeping a diary of your readings can be very helpful for discussions at your next appointment.  Billing [x]   X-ray & Lab Orders: These are billed by separate companies. Contact the invoicing company directly for questions or concerns. [x]   Visit Charges: Discuss any billing inquiries with our administrative services team.  Your Satisfaction Matters [x]   Share Your Experience: We strive for your satisfaction! If you have any complaints, or preferably compliments, please  let Dr. Jon Billings know directly or contact our Practice Administrators, Edwena Felty or [employer], by asking at the front desk.   Reviewing Your Records [x]   Review this early draft of your clinical encounter notes below and the final encounter summary tomorrow on MyChart after its been completed.  All orders placed so far are visible here: Malaise  Essential thrombocytosis (HCC) Assessment & Plan: high platelet count is managed with Jakafi, and recent labs showed a decrease in platelet count. We will continue Jakafi as prescribed by Oncology.    I reassured her that there is no evidence that her rash and malaise and night sweats are part of a lymphoma disorder(s). Will share that she has this concern(s) with oncology.   Post herpetic neuralgia  Night sweats Assessment & Plan: Night Sweats: They experience chronic, almost nightly excessive sweating on the neck without a clear cause, though it may relate to their high platelet count. We will continue monitoring symptoms.  I considered this maybe as a chronic occult infection(s) which contributed to my decision to try empiric doxycycline today.   History of smoking 25-50 pack years -     HM DEXA SCAN  Skin infection -     Doxycycline Hyclate; Take 1 tablet (100 mg total) by mouth 2 (two) times daily.  Dispense: 20 tablet; Refill: 0  Skin rash Assessment & Plan: Persistent Skin Rash: Initially diagnosed as shingles, the rash has an atypical presentation and persists despite antiviral treatment, with biopsy results inconclusive. A persistent  irritated spot at one of the biopsy sites suggests a possible infection. We will start Doxycycline for 10 days to cover potential skin infection, sinus infection, and tick-borne diseases, and request an upload of a picture of the skin spot for further evaluation.   Post-menopausal -     HM DEXA SCAN

## 2022-11-03 NOTE — Assessment & Plan Note (Signed)
Persistent Skin Rash: Initially diagnosed as shingles, the rash has an atypical presentation and persists despite antiviral treatment, with biopsy results inconclusive. A persistent irritated spot at one of the biopsy sites suggests a possible infection. We will start Doxycycline for 10 days to cover potential skin infection, sinus infection, and tick-borne diseases, and request an upload of a picture of the skin spot for further evaluation.

## 2022-11-03 NOTE — Assessment & Plan Note (Signed)
Night Sweats: They experience chronic, almost nightly excessive sweating on the neck without a clear cause, though it may relate to their high platelet count. We will continue monitoring symptoms.  I considered this maybe as a chronic occult infection(s) which contributed to my decision to try empiric doxycycline today.

## 2022-11-09 ENCOUNTER — Ambulatory Visit
Admission: RE | Admit: 2022-11-09 | Discharge: 2022-11-09 | Disposition: A | Discharge status: Home / Self Care | Payer: Medicare Other | Source: Ambulatory Visit | Attending: Internal Medicine | Admitting: Internal Medicine

## 2022-11-09 DIAGNOSIS — Z87891 Personal history of nicotine dependence: Secondary | ICD-10-CM | POA: Diagnosis not present

## 2022-11-10 ENCOUNTER — Other Ambulatory Visit: Payer: Self-pay | Admitting: Internal Medicine

## 2022-11-10 DIAGNOSIS — R195 Other fecal abnormalities: Secondary | ICD-10-CM

## 2022-11-10 DIAGNOSIS — Z860100 Personal history of colon polyps, unspecified: Secondary | ICD-10-CM | POA: Insufficient documentation

## 2022-11-10 NOTE — Progress Notes (Signed)
Notify patient:  her Cologuard testing was positive - possible causes are:        - Precancerous growths (polyps) - (95%) These abnormal tissue changes may become cancer if left untreated.       - Colorectal cancer (less common) - This is found in only 4% of positive results, and is usually still curable at time of discovery.       - Sometimes the test can produce a false result.  My very STRONG recommendation is to schedule a colonoscopy - so I've already initiated a referral for that.   Inform her that if she doesn't hear from colonoscopy scheduling within 1-2 weeks, call us back to check the status of the referral.  If patient not agreeable to colonoscopy, then see if she would be willing to schedule an appointment to discuss it further with me.  If not agreeable to that either, then send task back to me so I can give her a call about it.    Kara Olszewski, MD  11/10/2022 12:46 PM

## 2022-11-13 NOTE — Progress Notes (Signed)
Patient Care Team: Lula Olszewski, MD as PCP - General (Internal Medicine) Serena Croissant, MD as Consulting Physician (Hematology and Oncology) Bensimhon, Bevelyn Buckles, MD as Consulting Physician (Cardiology)  DIAGNOSIS:  Encounter Diagnosis  Name Primary?   Essential thrombocytosis (HCC) Yes     CHIEF COMPLIANT: thrombocytosis and hereditary hemochromatosis     INTERVAL HISTORY: Kara [name] is a 72 y.o. female with a history of thrombocytosis and hereditary hemochromatosis. She presents to the clinic for a follow-up. Patient reports the rash is worst. She states that it is raw.It is internal. Moving across her face. Numbness in the face that feels like it travels. She is going back to exercising and trying to loose the weight that she gained. She says that she is having nightmares.    ALLERGIES:  is allergic to penicillins and tramadol.  MEDICATIONS:  Current Outpatient Medications  Medication Sig Dispense Refill   aspirin EC 81 MG tablet Take 1 tablet (81 mg total) by mouth daily.     doxycycline (VIBRA-TABS) 100 MG tablet Take 1 tablet (100 mg total) by mouth 2 (two) times daily. 20 tablet 0   ruxolitinib phosphate (JAKAFI) 5 MG tablet Take 1 tablet (5 mg total) by mouth 2 (two) times daily. 60 tablet 3   varenicline (CHANTIX) 0.5 MG tablet      No current facility-administered medications for this visit.    PHYSICAL EXAMINATION: ECOG PERFORMANCE STATUS: 1 - Symptomatic but completely ambulatory  Vitals:   11/18/22 0905  BP: (!) 142/82  Pulse: 75  Resp: 18  Temp: (!) 97.5 F (36.4 C)  SpO2: 95%   Filed Weights   11/18/22 0905  Weight: 131 lb 4.8 oz (59.6 kg)     LABORATORY DATA:  I have reviewed the data as listed    Latest Ref Rng & Units 11/18/2022    8:17 AM 10/12/2022    2:17 PM 09/22/2022    9:26 AM  CMP  Glucose 70 - 99 mg/dL 88  191  98   BUN 8 - 23 mg/dL 17  16  12    Creatinine 0.44 - 1.00 mg/dL 4.78  2.95  6.21   Sodium 135 - 145 mmol/L 137  138  137    Potassium 3.5 - 5.1 mmol/L 4.1  4.2  4.2   Chloride 98 - 111 mmol/L 105  105  106   CO2 22 - 32 mmol/L 23  25  24    Calcium 8.9 - 10.3 mg/dL 9.6  9.4  9.3   Total Protein 6.5 - 8.1 g/dL 7.2  7.1  7.1   Total Bilirubin 0.3 - 1.2 mg/dL 0.5  0.3  0.4   Alkaline Phos 38 - 126 U/L 56  70  68   AST 15 - 41 U/L 19  17  18    ALT 0 - 44 U/L 9  9  9      Lab Results  Component Value Date   WBC 9.3 11/18/2022   HGB 13.3 11/18/2022   HCT 39.0 11/18/2022   MCV 91.3 11/18/2022   PLT 739 (H) 11/18/2022   NEUTROABS 5.7 11/18/2022    ASSESSMENT & PLAN:  Essential thrombocytosis (HCC) Lab: 06/18/21:  Hb 11.2, Platelet 778 07/30/2021: Hemoglobin 12.9, platelets 783, iron saturation 13%, ferritin 7  10/01/2021: Hemoglobin 14.1, platelets 828, ferritin 22 11/02/2021: Hemoglobin 12.9, platelets 435, ferritin 22, iron saturation 54%  04/06/2022: Platelets 763 05/20/2022: Platelets 782, hemoglobin 13.2, iron saturation 64%, ferritin 42, folate 13.3, B12 222 06/09/22: platelets  826 (shingles) 06/21/2022: Platelets 701, iron saturation 66%, ferritin 31 07/07/2022: Platelets 664, ferritin 34, iron saturation 56%, hemoglobin 14.6 08/15/2022: Platelets 857 08/26/2022: Platelets 938, SPEP, KL ratio: Normal, rheumatoid factor: 10: Normal, ANA, SSA SSB: Negative 09/14/2022: Ferritin 30, iron saturation 56%, hemoglobin 13.8, platelets 951   Severe palpitations: Related to anagrelide. D/C anagrelide and her palpitations have mostly resolved.   Current treatment: Since the patient is intolerant to anagrelide and hydroxyurea, currently on Jakafi at the low-dose of 5 mg twice a day.  Started 09/18/2022   Rash: On the right chest wall.  It seems to be getting worse again.  I discussed with her about gabapentin but decided not to do it because she has a tendency to get side effects to medications.  We talked about PEA.   Jakafi toxicities: Denies any major adverse effects to Jakafi Occasional nightmares  Hemochromatosis:  On periodic phlebotomies, recommended to be done once a month from this point.  Return to clinic in 2 months with labs and follow-up    No orders of the defined types were placed in this encounter.  The patient has a good understanding of the overall plan. she agrees with it. she will call with any problems that may develop before the next visit here. Total time spent: 30 mins including face to face time and time spent for planning, charting and co-ordination of care   Tamsen Meek, MD 11/18/22    I Janan Ridge am acting as a Neurosurgeon for [employer]  I have reviewed the above documentation for accuracy and completeness, and I agree with the above.

## 2022-11-14 ENCOUNTER — Encounter: Payer: Self-pay | Admitting: Internal Medicine

## 2022-11-14 ENCOUNTER — Telehealth: Payer: Self-pay | Admitting: Internal Medicine

## 2022-11-14 NOTE — Telephone Encounter (Signed)
Patient requests to be called with results from Lung Cancer Screening

## 2022-11-15 ENCOUNTER — Encounter: Payer: Self-pay | Admitting: Internal Medicine

## 2022-11-15 DIAGNOSIS — J439 Emphysema, unspecified: Secondary | ICD-10-CM | POA: Insufficient documentation

## 2022-11-15 DIAGNOSIS — I700 Atherosclerosis of aorta: Secondary | ICD-10-CM | POA: Insufficient documentation

## 2022-11-15 DIAGNOSIS — R918 Other nonspecific abnormal finding of lung field: Secondary | ICD-10-CM | POA: Insufficient documentation

## 2022-11-15 DIAGNOSIS — I712 Thoracic aortic aneurysm, without rupture, unspecified: Secondary | ICD-10-CM | POA: Insufficient documentation

## 2022-11-15 NOTE — Telephone Encounter (Signed)
Called [facility] line to have the viewing of her imaging moved to the top, as it has not been reviewed yet.

## 2022-11-15 NOTE — Telephone Encounter (Signed)
Spoke with patient and informed her of this information.  

## 2022-11-15 NOTE — Progress Notes (Signed)
Please follow up with the patient by phone if they haven't viewed their MyChart results within 5 days. Inform them that their lung cancer screening CT results from 11/09/22 are available and require follow-up. Summarize the key findings:  1) 8 mm left upper lobe nodule requiring 16-month follow-up CT 2) 4.3 cm ascending thoracic aortic aneurysm needing annual imaging 3) Evidence of emphysema Emphasize the importance of scheduling the follow-up CT in 3 months and annual aortic imaging. Offer to assist with placing the orders for this, and the annual CT chest with contrast to assess the thoracic aorta. Additionally, inform the patient that we're recommending referrals to both pulmonary and vascular specialists for more detailed evaluation and management. Offer to help arrange these referrals as well. Reinforce the importance of smoking cessation if the patient is still smoking- I think they have stopped though so they just need to continue to abstain.  They should continue  taking aspirin 81 mg daily  due to hardening of the arteries, and add rosuvastatin 40 mg daily 90 day supply with 3 rf if they agree.  Ask if they have any questions or concerns about the results or follow-up plan.  Its a lot to take in and I'm happy to go over it all in detail at an appointment if she would like to schedule.

## 2022-11-16 ENCOUNTER — Telehealth: Payer: Self-pay | Admitting: Internal Medicine

## 2022-11-16 ENCOUNTER — Other Ambulatory Visit: Payer: Self-pay

## 2022-11-16 DIAGNOSIS — I7101 Dissection of ascending aorta: Secondary | ICD-10-CM

## 2022-11-16 DIAGNOSIS — R911 Solitary pulmonary nodule: Secondary | ICD-10-CM

## 2022-11-16 DIAGNOSIS — I7113 Aneurysm of the descending thoracic aorta, ruptured: Secondary | ICD-10-CM

## 2022-11-16 DIAGNOSIS — I7121 Aneurysm of the ascending aorta, without rupture: Secondary | ICD-10-CM

## 2022-11-16 NOTE — Telephone Encounter (Signed)
Patient states her Preference for a Cardiologist is Dr. Arvilla Meres  located at [address] [place], [place], [place] [address], Ph# [phone]

## 2022-11-18 ENCOUNTER — Inpatient Hospital Stay: Payer: Medicare Other

## 2022-11-18 ENCOUNTER — Inpatient Hospital Stay (HOSPITAL_BASED_OUTPATIENT_CLINIC_OR_DEPARTMENT_OTHER): Payer: Medicare Other | Admitting: Hematology and Oncology

## 2022-11-18 ENCOUNTER — Inpatient Hospital Stay: Payer: Medicare Other | Attending: Hematology and Oncology

## 2022-11-18 ENCOUNTER — Other Ambulatory Visit: Payer: Self-pay

## 2022-11-18 VITALS — BP 142/82 | HR 75 | Temp 97.5°F | Resp 18 | Ht 61.0 in | Wt 131.3 lb

## 2022-11-18 DIAGNOSIS — D75839 Thrombocytosis, unspecified: Secondary | ICD-10-CM | POA: Diagnosis not present

## 2022-11-18 DIAGNOSIS — R21 Rash and other nonspecific skin eruption: Secondary | ICD-10-CM | POA: Diagnosis not present

## 2022-11-18 DIAGNOSIS — D473 Essential (hemorrhagic) thrombocythemia: Secondary | ICD-10-CM

## 2022-11-18 LAB — CMP (CANCER CENTER ONLY)
ALT: 9 U/L (ref 0–44)
AST: 19 U/L (ref 15–41)
Albumin: 4.9 g/dL (ref 3.5–5.0)
Alkaline Phosphatase: 56 U/L (ref 38–126)
Anion gap: 9 (ref 5–15)
BUN: 17 mg/dL (ref 8–23)
CO2: 23 mmol/L (ref 22–32)
Calcium: 9.6 mg/dL (ref 8.9–10.3)
Chloride: 105 mmol/L (ref 98–111)
Creatinine: 0.66 mg/dL (ref 0.44–1.00)
GFR, Estimated: >60 mL/min (ref >60)
Glucose, Bld: 88 mg/dL (ref 70–99)
Potassium: 4.1 mmol/L (ref 3.5–5.1)
Sodium: 137 mmol/L (ref 135–145)
Total Bilirubin: 0.5 mg/dL (ref 0.3–1.2)
Total Protein: 7.2 g/dL (ref 6.5–8.1)

## 2022-11-18 LAB — IRON AND IRON BINDING CAPACITY (CC-WL,HP ONLY)
Iron: 177 ug/dL — ABNORMAL HIGH (ref 28–170)
Saturation Ratios: 67 % — ABNORMAL HIGH (ref 10.4–31.8)
TIBC: 265 ug/dL (ref 250–450)
UIBC: 88 ug/dL — ABNORMAL LOW (ref 148–442)

## 2022-11-18 LAB — FERRITIN: Ferritin: 43 ng/mL (ref 11–307)

## 2022-11-18 LAB — CBC WITH DIFFERENTIAL (CANCER CENTER ONLY)
Abs Immature Granulocytes: 0.03 10*3/uL (ref 0.00–0.07)
Basophils Absolute: 0 10*3/uL (ref 0.0–0.1)
Basophils Relative: 0 %
Eosinophils Absolute: 0.2 10*3/uL (ref 0.0–0.5)
Eosinophils Relative: 3 %
HCT: 39 % (ref 36.0–46.0)
Hemoglobin: 13.3 g/dL (ref 12.0–15.0)
Immature Granulocytes: 0 %
Lymphocytes Relative: 29 %
Lymphs Abs: 2.7 10*3/uL (ref 0.7–4.0)
MCH: 31.1 pg (ref 26.0–34.0)
MCHC: 34.1 g/dL (ref 30.0–36.0)
MCV: 91.3 fL (ref 80.0–100.0)
Monocytes Absolute: 0.6 10*3/uL (ref 0.1–1.0)
Monocytes Relative: 7 %
Neutro Abs: 5.7 10*3/uL (ref 1.7–7.7)
Neutrophils Relative %: 61 %
Platelet Count: 739 10*3/uL — ABNORMAL HIGH (ref 150–400)
RBC: 4.27 MIL/uL (ref 3.87–5.11)
RDW: 14.5 % (ref 11.5–15.5)
WBC Count: 9.3 10*3/uL (ref 4.0–10.5)
nRBC: 0 % (ref 0.0–0.2)

## 2022-11-18 NOTE — Assessment & Plan Note (Signed)
Lab: 06/18/21:  Hb 11.2, Platelet 778 07/30/2021: Hemoglobin 12.9, platelets 783, iron saturation 13%, ferritin 7  10/01/2021: Hemoglobin 14.1, platelets 828, ferritin 22 11/02/2021: Hemoglobin 12.9, platelets 435, ferritin 22, iron saturation 54%  04/06/2022: Platelets 763 05/20/2022: Platelets 782, hemoglobin 13.2, iron saturation 64%, ferritin 42, folate 13.3, B12 222 06/09/22: platelets 826 (shingles) 06/21/2022: Platelets 701, iron saturation 66%, ferritin 31 07/07/2022: Platelets 664, ferritin 34, iron saturation 56%, hemoglobin 14.6 08/15/2022: Platelets 857 08/26/2022: Platelets 938, SPEP, KL ratio: Normal, rheumatoid factor: 10: Normal, ANA, SSA SSB: Negative 09/14/2022: Ferritin 30, iron saturation 56%, hemoglobin 13.8, platelets 951   Severe palpitations: Related to anagrelide. D/C anagrelide and her palpitations have mostly resolved.   Current treatment: Since the patient is intolerant to anagrelide and hydroxyurea, currently on Jakafi at the low-dose of 5 mg twice a day.  Started 09/18/2022   Rash: Remarkable improvement in the rash since she is doing phlebotomy.   Jakafi toxicities: Denies any major adverse effects to Jakafi Hemochromatosis: On periodic phlebotomies  Return to clinic in 2 months with labs and follow-up

## 2022-11-18 NOTE — Telephone Encounter (Signed)
Patient spoke with Tiffany on yesterday about referrals, cardiology was not one that pcp wanted her to see.

## 2022-11-18 NOTE — Patient Instructions (Signed)

## 2022-11-18 NOTE — Progress Notes (Signed)
Therapeutic phlebotomy performed per MD order. 18G needle utilized on right wrist starting at 1012 and ending at 1027 with 502G removed. Pt tolerated well, flood & fluids offered. No IVF needed per MD. Pt monitored for 30 minutes without incident, VSS, ambulatory to the lobby.

## 2022-11-24 ENCOUNTER — Ambulatory Visit: Payer: Medicare Other

## 2022-11-24 VITALS — Wt 131.0 lb

## 2022-11-24 DIAGNOSIS — Z0000 Encounter for general adult medical examination without abnormal findings: Secondary | ICD-10-CM

## 2022-11-24 DIAGNOSIS — Z1231 Encounter for screening mammogram for malignant neoplasm of breast: Secondary | ICD-10-CM

## 2022-11-24 NOTE — Progress Notes (Signed)
Subjective:   Kara [name] is a 72 y.o. female who presents for an Initial Medicare Annual Wellness Visit.  Visit Complete: Virtual  I connected with  Kara [name] on 11/24/22 by a audio enabled telemedicine application and verified that I am speaking with the correct person using two identifiers.  Patient Location: Home  Provider Location: Office/Clinic  I discussed the limitations of evaluation and management by telemedicine. The patient expressed understanding and agreed to proceed.   Vital Signs: Unable to obtain new vitals due to this being a telehealth visit.   Review of Systems     Cardiac Risk Factors include: advanced age (>38men, >36 women)     Objective:    Today's Vitals   11/24/22 0800 11/24/22 0802  Weight: 131 lb (59.4 kg)   PainSc:  0-No pain   Body mass index is 24.75 kg/m.     11/24/2022    8:07 AM 11/18/2022    9:24 AM 09/22/2022   10:07 AM 09/16/2022    9:51 AM 08/18/2022   11:46 AM 07/21/2022   12:10 PM 05/29/2022   10:45 AM  Advanced Directives  Does Patient Have a Medical Advance Directive? No No No No No No No  Would patient like information on creating a medical advance directive? No - Patient declined No - Patient declined No - Patient declined  No - Patient declined No - Patient declined     Current Medications (verified) Outpatient Encounter Medications as of 11/24/2022  Medication Sig   aspirin EC 81 MG tablet Take 1 tablet (81 mg total) by mouth daily.   ruxolitinib phosphate (JAKAFI) 5 MG tablet Take 1 tablet (5 mg total) by mouth 2 (two) times daily.   [DISCONTINUED] doxycycline (VIBRA-TABS) 100 MG tablet Take 1 tablet (100 mg total) by mouth 2 (two) times daily.   [DISCONTINUED] varenicline (CHANTIX) 0.5 MG tablet    No facility-administered encounter medications on file as of 11/24/2022.    Allergies (verified) Penicillins and Tramadol   History: Past Medical History:  Diagnosis Date   Allergy Unknown   Penecillin   Anemia Unknown    Review lab tests from [facility]   Cataract One month   Very early stages   Clotting disorder (HCC) Unknown   Hemochromatosis & Jax2 Mutation   Hemochromatosis    Hypertension Unknown   Sometimes   Orthostatic hypotension 05/20/2016   Sleep apnea Unknown   Probably due to collapsed septum   Past Surgical History:  Procedure Laterality Date   COSMETIC SURGERY  Mar 07 2022   Facial   NASAL SEPTUM SURGERY     Family History  Problem Relation Age of Onset   Healthy Mother    Acute myelogenous leukemia Father    Bipolar disorder Sister    Diabetes Neg Hx    Cancer Neg Hx    Heart failure Neg Hx    Hyperlipidemia Neg Hx    Hypertension Neg Hx    Social History   Socioeconomic History   Marital status: Single    Spouse name: Not on file   Number of children: 0   Years of education: college   Highest education level: Master's degree (e.g., MA, MS, MEng, MEd, MSW, MBA)  Occupational History   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    She works as a Associate Professor for [employer].   Highest level education:  Masters   Right-handed.   Caffeine use: 4 cups per day.   Social Determinants of Health   Financial Resource Strain: Low Risk  (11/24/2022)   Overall Financial Resource Strain (CARDIA)    Difficulty of Paying Living Expenses: Not hard at all  Food Insecurity: No Food Insecurity (11/24/2022)   Hunger Vital Sign    Worried About Running Out  of Food in the Last Year: Never true    Ran Out of Food in the Last Year: Never true  Transportation Needs: No Transportation Needs (11/24/2022)   PRAPARE - Administrator, Civil Service (Medical): No    Lack of Transportation (Non-Medical): No  Physical Activity: Inactive (11/24/2022)   Exercise Vital Sign    Days of Exercise per Week: 0 days    Minutes of Exercise per Session: 0 min  Stress: No Stress Concern Present (11/24/2022)   [employer] of Occupational Health - Occupational Stress Questionnaire    Feeling of Stress : Not at all  Social Connections: Socially Isolated (11/24/2022)   Social Connection and Isolation Panel [NHANES]    Frequency of Communication with Friends and Family: Once a week    Frequency of Social Gatherings with Friends and Family: Never    Attends Religious Services: Never    Database administrator or Organizations: No    Attends Engineer, structural: Never    Marital Status: Never married    Tobacco Counseling Counseling given: Not Answered Tobacco comments: Quit   Clinical Intake:  Pre-visit preparation completed: Yes  Pain : No/denies pain Pain Score: 0-No pain     BMI - recorded: 24.75 Nutritional Status: BMI of 19-24  Normal Nutritional Risks: None Diabetes: No  How often do you need to have someone help you when you read instructions, pamphlets, or other written materials from your doctor or pharmacy?: 1 - Never  Interpreter Needed?: No  Information entered by :: Lanier Ensign, LPN   Activities of Daily Living    11/24/2022    8:03 AM  In your present state of health, do you have any difficulty performing the following activities:  Hearing? 0  Vision? 0  Difficulty concentrating or making decisions? 0  Walking or climbing stairs? 0  Dressing or bathing? 0  Doing errands, shopping? 0  Preparing Food and eating ? N  Using the Toilet? N  In the past six months, have you accidently leaked urine? N  Do you  have problems with loss of bowel control? N  Managing your Medications? N  Managing your Finances? N  Housekeeping or managing your Housekeeping? N    Patient Care Team: Lula Olszewski, MD as PCP - General (Internal Medicine) Serena Croissant, MD as Consulting Physician (Hematology and Oncology) Bensimhon, Bevelyn Buckles, MD as Consulting Physician (Cardiology)  Indicate any recent Medical Services you may have received from other than [facility] providers in the past year (date may  be approximate).     Assessment:   This is a routine wellness examination for Kara [name].  Hearing/Vision screen Hearing Screening - Comments:: Pt denies any hearing issues  Vision Screening - Comments:: Pt follows up with Dr Dione Booze for annual eye exams   Dietary issues and exercise activities discussed:     Goals Addressed             This Visit's Progress    Patient Stated       Loose 30 lbs and start exercising        Depression Screen    11/24/2022    8:06 AM 09/26/2022    2:19 PM  PHQ 2/9 Scores  PHQ - 2 Score 0 0    Fall Risk    11/24/2022    8:08 AM 11/03/2022    9:06 AM 06/21/2016    2:37 PM 05/20/2016    1:14 PM  Fall Risk   Falls in the past year? 0 0 No No  Number falls in past yr: 0 0    Injury with Fall? 0 0    Risk for fall due to : Impaired vision No Fall Risks    Follow up Falls prevention discussed Falls evaluation completed      MEDICARE RISK AT HOME:  Medicare Risk at Home - 11/24/22 6578     Any stairs in or around the home? No    If so, are there any without handrails? No    Home free of loose throw rugs in walkways, pet beds, electrical cords, etc? Yes    Adequate lighting in your home to reduce risk of falls? Yes    Life alert? No    Use of a cane, walker or w/c? No    Grab bars in the bathroom? Yes    Shower chair or bench in shower? Yes    Elevated toilet seat or a handicapped toilet? No             TIMED UP AND GO:  Was the test performed? No    Cognitive  Function:        11/24/2022    8:13 AM  6CIT Screen  What Year? 0 points  What month? 0 points  What time? 0 points  Count back from 20 0 points  Months in reverse 0 points  Repeat phrase 0 points  Total Score 0 points    Immunizations Immunization History  Administered Date(s) Administered   Influenza,inj,Quad PF,6+ Mos 04/28/2016    TDAP status: Due, Education has been provided regarding the importance of this vaccine. Advised may receive this vaccine at local pharmacy or Health Dept. Aware to provide a copy of the vaccination record if obtained from local pharmacy or Health Dept. Verbalized acceptance and understanding.  Flu Vaccine status: Declined, Education has been provided regarding the importance of this vaccine but patient still declined. Advised may receive this vaccine at local pharmacy or Health Dept. Aware to provide a copy of the vaccination record if obtained from local pharmacy or Health Dept. Verbalized acceptance and understanding.  Pneumococcal vaccine status: Declined,  Education has been provided regarding the importance of this vaccine but patient still declined. Advised may receive this vaccine at local pharmacy or Health Dept. Aware to provide a copy of the vaccination record if obtained from local pharmacy or Health Dept. Verbalized acceptance and understanding.   Covid-19 vaccine status: Completed vaccines  Qualifies for Shingles Vaccine? Yes   Zostavax completed No   Shingrix  Completed?: No.    Education has been provided regarding the importance of this vaccine. Patient has been advised to call insurance company to determine out of pocket expense if they have not yet received this vaccine. Advised may also receive vaccine at local pharmacy or Health Dept. Verbalized acceptance and understanding.  Screening Tests Health Maintenance  Topic Date Due   Hepatitis C Screening  Never done   Colonoscopy  Never done   MAMMOGRAM  Never done   INFLUENZA VACCINE   11/17/2022   Pneumonia Vaccine 67+ Years old (1 of 2 - PCV) 12/16/2022 (Originally 12/27/1956)   Zoster Vaccines- Shingrix (1 of 2) 12/16/2022 (Originally 12/28/1969)   COVID-19 Vaccine (1) 04/07/2023 (Originally 12/27/1955)   Lung Cancer Screening  11/09/2023   Medicare Annual Wellness (AWV)  11/24/2023   DEXA SCAN  Completed   HPV VACCINES  Aged Out   DTaP/Tdap/Td  Discontinued    Health Maintenance  Health Maintenance Due  Topic Date Due   Hepatitis C Screening  Never done   Colonoscopy  Never done   MAMMOGRAM  Never done   INFLUENZA VACCINE  11/17/2022    Colonoscopy scheduled in December per pt   Mammogram status: Ordered 11/24/22. Pt provided with contact info and advised to call to schedule appt.   Bone Density status: Completed 11/03/22. Results reflect: Bone density results: NORMAL. Repeat every 2 years.  Lung Cancer Screening: (Low Dose CT Chest recommended if Age 74-80 years, 20 pack-year currently smoking OR have quit w/in 15years.) does qualify.   Lung Cancer Screening Referral: completed 11/18/22  Additional Screening:  Hepatitis C Screening: does qualify  Vision Screening: Recommended annual ophthalmology exams for early detection of glaucoma and other disorders of the eye. Is the patient up to date with their annual eye exam?  Yes  Who is the provider or what is the name of the office in which the patient attends annual eye exams? Dr Dione Booze  If pt is not established with a provider, would they like to be referred to a provider to establish care? No .   Dental Screening: Recommended annual dental exams for proper oral hygiene    Community Resource Referral / Chronic Care Management: CRR required this visit?  No   CCM required this visit?  No     Plan:     I have personally reviewed and noted the following in the patient's chart:   Medical and social history Use of alcohol, tobacco or illicit drugs  Current medications and supplements including opioid  prescriptions. Patient is not currently taking opioid prescriptions. Functional ability and status Nutritional status Physical activity Advanced directives List of other physicians Hospitalizations, surgeries, and ER visits in previous 12 months Vitals Screenings to include cognitive, depression, and falls Referrals and appointments  In addition, I have reviewed and discussed with patient certain preventive protocols, quality metrics, and best practice recommendations. A written personalized care plan for preventive services as well as general preventive health recommendations were provided to patient.     Marzella Schlein, LPN   4/0/9811   After Visit Summary: (Mail) Due to this being a telephonic visit, the after visit summary with patients personalized plan was offered to patient via mail   Nurse Notes: none

## 2022-11-24 NOTE — Patient Instructions (Signed)
Kara [name] , Thank you for taking time to come for your Medicare Wellness Visit. I appreciate your ongoing commitment to your health goals. Please review the following plan we discussed and let me know if I can assist you in the future.   Referrals/Orders/Follow-Ups/Clinician Recommendations: lose 30 lbs and exercise   This is a list of the screening recommended for you and due dates:  Health Maintenance  Topic Date Due   Medicare Annual Wellness Visit  Never done   Hepatitis C Screening  Never done   Colon Cancer Screening  Never done   Mammogram  Never done   Flu Shot  11/17/2022   Pneumonia Vaccine (1 of 2 - PCV) 12/16/2022*   Zoster (Shingles) Vaccine (1 of 2) 12/16/2022*   COVID-19 Vaccine (1) 04/07/2023*   Screening for Lung Cancer  11/09/2023   DEXA scan (bone density measurement)  Completed   HPV Vaccine  Aged Out   DTaP/Tdap/Td vaccine  Discontinued  *Topic was postponed. The date shown is not the original due date.    Advanced directives: (Declined) Advance directive discussed with you today. Even though you declined this today, please call our office should you change your mind, and we can give you the proper paperwork for you to fill out.  Next Medicare Annual Wellness Visit scheduled for next year: Yes  Preventive Care 56 Years and Older, Female Preventive care refers to lifestyle choices and visits with your health care provider that can promote health and wellness. What does preventive care include? A yearly physical exam. This is also called an annual well check. Dental exams once or twice a year. Routine eye exams. Ask your health care provider how often you should have your eyes checked. Personal lifestyle choices, including: Daily care of your teeth and gums. Regular physical activity. Eating a healthy diet. Avoiding tobacco and drug use. Limiting alcohol use. Practicing safe sex. Taking low-dose aspirin every day. Taking vitamin and mineral supplements as  recommended by your health care provider. What happens during an annual well check? The services and screenings done by your health care provider during your annual well check will depend on your age, overall health, lifestyle risk factors, and family history of disease. Counseling  Your health care provider may ask you questions about your: Alcohol use. Tobacco use. Drug use. Emotional well-being. Home and relationship well-being. Sexual activity. Eating habits. History of falls. Memory and ability to understand (cognition). Work and work Astronomer. Reproductive health. Screening  You may have the following tests or measurements: Height, weight, and BMI. Blood pressure. Lipid and cholesterol levels. These may be checked every 5 years, or more frequently if you are over 73 years old. Skin check. Lung cancer screening. You may have this screening every year starting at age 9 if you have a 30-pack-year history of smoking and currently smoke or have quit within the past 15 years. Fecal occult blood test (FOBT) of the stool. You may have this test every year starting at age 88. Flexible sigmoidoscopy or colonoscopy. You may have a sigmoidoscopy every 5 years or a colonoscopy every 10 years starting at age 52. Hepatitis C blood test. Hepatitis B blood test. Sexually transmitted disease (STD) testing. Diabetes screening. This is done by checking your blood sugar (glucose) after you have not eaten for a while (fasting). You may have this done every 1-3 years. Bone density scan. This is done to screen for osteoporosis. You may have this done starting at age 53. Mammogram. This may  be done every 1-2 years. Talk to your health care provider about how often you should have regular mammograms. Talk with your health care provider about your test results, treatment options, and if necessary, the need for more tests. Vaccines  Your health care provider may recommend certain vaccines, such  as: Influenza vaccine. This is recommended every year. Tetanus, diphtheria, and acellular pertussis (Tdap, Td) vaccine. You may need a Td booster every 10 years. Zoster vaccine. You may need this after age 98. Pneumococcal 13-valent conjugate (PCV13) vaccine. One dose is recommended after age 12. Pneumococcal polysaccharide (PPSV23) vaccine. One dose is recommended after age 83. Talk to your health care provider about which screenings and vaccines you need and how often you need them. This information is not intended to replace advice given to you by your health care provider. Make sure you discuss any questions you have with your health care provider. Document Released: 05/01/2015 Document Revised: 12/23/2015 Document Reviewed: 02/03/2015 Elsevier Interactive Patient Education  2017 [employer].  Fall Prevention in the Home Falls can cause injuries. They can happen to people of all ages. There are many things you can do to make your home safe and to help prevent falls. What can I do on the outside of my home? Regularly fix the edges of walkways and driveways and fix any cracks. Remove anything that might make you trip as you walk through a door, such as a raised step or threshold. Trim any bushes or trees on the path to your home. Use bright outdoor lighting. Clear any walking paths of anything that might make someone trip, such as rocks or tools. Regularly check to see if handrails are loose or broken. Make sure that both sides of any steps have handrails. Any raised decks and porches should have guardrails on the edges. Have any leaves, snow, or ice cleared regularly. Use sand or salt on walking paths during winter. Clean up any spills in your garage right away. This includes oil or grease spills. What can I do in the bathroom? Use night lights. Install grab bars by the toilet and in the tub and shower. Do not use towel bars as grab bars. Use non-skid mats or decals in the tub or  shower. If you need to sit down in the shower, use a plastic, non-slip stool. Keep the floor dry. Clean up any water that spills on the floor as soon as it happens. Remove soap buildup in the tub or shower regularly. Attach bath mats securely with double-sided non-slip rug tape. Do not have throw rugs and other things on the floor that can make you trip. What can I do in the bedroom? Use night lights. Make sure that you have a light by your bed that is easy to reach. Do not use any sheets or blankets that are too big for your bed. They should not hang down onto the floor. Have a firm chair that has side arms. You can use this for support while you get dressed. Do not have throw rugs and other things on the floor that can make you trip. What can I do in the kitchen? Clean up any spills right away. Avoid walking on wet floors. Keep items that you use a lot in easy-to-reach places. If you need to reach something above you, use a strong step stool that has a grab bar. Keep electrical cords out of the way. Do not use floor polish or wax that makes floors slippery. If you must use  wax, use non-skid floor wax. Do not have throw rugs and other things on the floor that can make you trip. What can I do with my stairs? Do not leave any items on the stairs. Make sure that there are handrails on both sides of the stairs and use them. Fix handrails that are broken or loose. Make sure that handrails are as long as the stairways. Check any carpeting to make sure that it is firmly attached to the stairs. Fix any carpet that is loose or worn. Avoid having throw rugs at the top or bottom of the stairs. If you do have throw rugs, attach them to the floor with carpet tape. Make sure that you have a light switch at the top of the stairs and the bottom of the stairs. If you do not have them, ask someone to add them for you. What else can I do to help prevent falls? Wear shoes that: Do not have high heels. Have  rubber bottoms. Are comfortable and fit you well. Are closed at the toe. Do not wear sandals. If you use a stepladder: Make sure that it is fully opened. Do not climb a closed stepladder. Make sure that both sides of the stepladder are locked into place. Ask someone to hold it for you, if possible. Clearly mark and make sure that you can see: Any grab bars or handrails. First and last steps. Where the edge of each step is. Use tools that help you move around (mobility aids) if they are needed. These include: Canes. Walkers. Scooters. Crutches. Turn on the lights when you go into a dark area. Replace any light bulbs as soon as they burn out. Set up your furniture so you have a clear path. Avoid moving your furniture around. If any of your floors are uneven, fix them. If there are any pets around you, be aware of where they are. Review your medicines with your doctor. Some medicines can make you feel dizzy. This can increase your chance of falling. Ask your doctor what other things that you can do to help prevent falls. This information is not intended to replace advice given to you by your health care provider. Make sure you discuss any questions you have with your health care provider. Document Released: 01/29/2009 Document Revised: 09/10/2015 Document Reviewed: 05/09/2014 Elsevier Interactive Patient Education  2017 [employer].

## 2022-11-27 ENCOUNTER — Ambulatory Visit (HOSPITAL_BASED_OUTPATIENT_CLINIC_OR_DEPARTMENT_OTHER): Payer: Medicare Other | Admitting: Radiology

## 2022-12-01 ENCOUNTER — Telehealth: Payer: Self-pay | Admitting: Pharmacy Technician

## 2022-12-01 NOTE — Telephone Encounter (Addendum)
Oral Oncology Patient Advocate Encounter   Received notification that the application for assistance for Jakafi through [employer] has been approved.   [employer] phone number [phone].   Effective dates: 09/07/22 through 04/18/23  Medication will be filled at  [facility]  I have spoken to the patient.  Jinger Neighbors, CPhT-Adv Oncology Pharmacy Patient Advocate [facility] Direct Number: [phone]  Fax: [phone]

## 2022-12-01 NOTE — Telephone Encounter (Signed)
Oral Oncology Patient Advocate Encounter   Submitted POI for assistance for Jakafi to [facility].   Application submitted via e-fax to [phone]   [facility] phone number [phone].   I will continue to check the status until final determination.   Jinger Neighbors, CPhT-Adv Oncology Pharmacy Patient Advocate [facility] Direct Number: [phone]  Fax: [phone]

## 2022-12-03 ENCOUNTER — Ambulatory Visit (HOSPITAL_BASED_OUTPATIENT_CLINIC_OR_DEPARTMENT_OTHER)
Admission: RE | Admit: 2022-12-03 | Discharge: 2022-12-03 | Disposition: A | Discharge status: Home / Self Care | Payer: Medicare Other | Source: Ambulatory Visit | Attending: Internal Medicine | Admitting: Internal Medicine

## 2022-12-03 ENCOUNTER — Other Ambulatory Visit: Payer: Self-pay | Admitting: Internal Medicine

## 2022-12-03 DIAGNOSIS — Z0000 Encounter for general adult medical examination without abnormal findings: Secondary | ICD-10-CM

## 2022-12-03 DIAGNOSIS — Z1231 Encounter for screening mammogram for malignant neoplasm of breast: Secondary | ICD-10-CM

## 2022-12-04 ENCOUNTER — Ambulatory Visit (HOSPITAL_BASED_OUTPATIENT_CLINIC_OR_DEPARTMENT_OTHER): Payer: Medicare Other | Admitting: Radiology

## 2022-12-07 ENCOUNTER — Other Ambulatory Visit: Payer: Self-pay | Admitting: Internal Medicine

## 2022-12-07 DIAGNOSIS — R928 Other abnormal and inconclusive findings on diagnostic imaging of breast: Secondary | ICD-10-CM

## 2022-12-08 ENCOUNTER — Encounter: Payer: Self-pay | Admitting: Internal Medicine

## 2022-12-08 ENCOUNTER — Telehealth: Payer: Self-pay

## 2022-12-08 DIAGNOSIS — R928 Other abnormal and inconclusive findings on diagnostic imaging of breast: Secondary | ICD-10-CM | POA: Insufficient documentation

## 2022-12-08 NOTE — Progress Notes (Signed)
Patient Follow-Up: Mammogram Results  Key Points: 1. Mammogram findings: Possible masses in both breasts requiring further evaluation 2. BI-RADS Category: 0 (Incomplete - additional imaging needed) 3. Next step: Ultrasound of both breasts (Code: [id number])  Action Items: - Confirm ultrasound scheduling - Address any immediate patient concerns - Remind patient: additional imaging is common and often confirms benign conditions  Patient Education: - Explain ultrasound procedure: painless, non-invasive - Emphasize importance of prompt follow-up - Encourage continued breast self-exams  If patient expresses high anxiety: - Offer reassurance about the frequency of additional imaging needs - Avoid speculating about outcomes - Suggest patient write down questions for the ultrasound appointment  Your role in this follow-up is crucial. You're providing important information and support during a potentially stressful time for the patient. Thank you for your dedication to patient care and for handling these sensitive conversations with empathy and professionalism.  If you encounter any issues or have concerns about the patient's response, please don't hesitate to reach out to me directly.

## 2022-12-08 NOTE — Telephone Encounter (Signed)
Kara [name] called in and wanted to see Dr, Pamelia Hoit before she saw Dr. Jon Billings for a re-evaluation of possible shingles.  I was able to move her from 9/6 to 9/5 with Dr. Pamelia Hoit.  She will get her phlebotomoy on 8/30 and see Dr. Pamelia Hoit on 9/5. She is still having lots of breast pain.  She stated that [facility] got several images of her right breast. She is not worried about breast cancer but would like to get to the bottom of what is going on. Lorayne Marek, RN

## 2022-12-16 ENCOUNTER — Inpatient Hospital Stay: Payer: Medicare Other | Admitting: Hematology and Oncology

## 2022-12-16 ENCOUNTER — Inpatient Hospital Stay: Payer: Medicare Other

## 2022-12-16 DIAGNOSIS — R21 Rash and other nonspecific skin eruption: Secondary | ICD-10-CM | POA: Diagnosis not present

## 2022-12-16 DIAGNOSIS — D75839 Thrombocytosis, unspecified: Secondary | ICD-10-CM | POA: Diagnosis not present

## 2022-12-16 DIAGNOSIS — D473 Essential (hemorrhagic) thrombocythemia: Secondary | ICD-10-CM | POA: Diagnosis not present

## 2022-12-16 LAB — CMP (CANCER CENTER ONLY)
ALT: 9 U/L (ref 0–44)
AST: 16 U/L (ref 15–41)
Albumin: 4.5 g/dL (ref 3.5–5.0)
Alkaline Phosphatase: 64 U/L (ref 38–126)
Anion gap: 8 (ref 5–15)
BUN: 17 mg/dL (ref 8–23)
CO2: 22 mmol/L (ref 22–32)
Calcium: 9.3 mg/dL (ref 8.9–10.3)
Chloride: 108 mmol/L (ref 98–111)
Creatinine: 0.67 mg/dL (ref 0.44–1.00)
GFR, Estimated: >60 mL/min (ref >60)
Glucose, Bld: 103 mg/dL — ABNORMAL HIGH (ref 70–99)
Potassium: 4.1 mmol/L (ref 3.5–5.1)
Sodium: 138 mmol/L (ref 135–145)
Total Bilirubin: 0.3 mg/dL (ref 0.3–1.2)
Total Protein: 6.9 g/dL (ref 6.5–8.1)

## 2022-12-16 LAB — CBC WITH DIFFERENTIAL (CANCER CENTER ONLY)
Abs Immature Granulocytes: 0.03 10*3/uL (ref 0.00–0.07)
Basophils Absolute: 0 10*3/uL (ref 0.0–0.1)
Basophils Relative: 1 %
Eosinophils Absolute: 0.3 10*3/uL (ref 0.0–0.5)
Eosinophils Relative: 3 %
HCT: 35.8 % — ABNORMAL LOW (ref 36.0–46.0)
Hemoglobin: 11.8 g/dL — ABNORMAL LOW (ref 12.0–15.0)
Immature Granulocytes: 0 %
Lymphocytes Relative: 27 %
Lymphs Abs: 2.2 10*3/uL (ref 0.7–4.0)
MCH: 31.1 pg (ref 26.0–34.0)
MCHC: 33 g/dL (ref 30.0–36.0)
MCV: 94.5 fL (ref 80.0–100.0)
Monocytes Absolute: 0.7 10*3/uL (ref 0.1–1.0)
Monocytes Relative: 8 %
Neutro Abs: 4.9 10*3/uL (ref 1.7–7.7)
Neutrophils Relative %: 61 %
Platelet Count: 725 10*3/uL — ABNORMAL HIGH (ref 150–400)
RBC: 3.79 MIL/uL — ABNORMAL LOW (ref 3.87–5.11)
RDW: 15 % (ref 11.5–15.5)
WBC Count: 8.1 10*3/uL (ref 4.0–10.5)
nRBC: 0 % (ref 0.0–0.2)

## 2022-12-16 LAB — IRON AND IRON BINDING CAPACITY (CC-WL,HP ONLY)
Iron: 128 ug/dL (ref 28–170)
Saturation Ratios: 39 % — ABNORMAL HIGH (ref 10.4–31.8)
TIBC: 330 ug/dL (ref 250–450)
UIBC: 202 ug/dL

## 2022-12-16 LAB — FERRITIN: Ferritin: 12 ng/mL (ref 11–307)

## 2022-12-16 NOTE — Progress Notes (Signed)
Dr. Pamelia Hoit is not present today- per Lillard Anes, NP no phlebotomy today. Iron binding labs will not be back from the main hospital until late today. Hemoglobin is 11.8. Patient aware- she has a follow up with Dr. Pamelia Hoit on the 5th of next month (next week).

## 2022-12-20 ENCOUNTER — Encounter: Payer: Self-pay | Admitting: Hematology and Oncology

## 2022-12-20 ENCOUNTER — Other Ambulatory Visit: Payer: Self-pay | Admitting: Internal Medicine

## 2022-12-20 ENCOUNTER — Ambulatory Visit
Admission: RE | Admit: 2022-12-20 | Discharge: 2022-12-20 | Disposition: A | Discharge status: Home / Self Care | Payer: Medicare Other | Source: Ambulatory Visit | Attending: Internal Medicine | Admitting: Internal Medicine

## 2022-12-20 DIAGNOSIS — R928 Other abnormal and inconclusive findings on diagnostic imaging of breast: Secondary | ICD-10-CM

## 2022-12-20 DIAGNOSIS — N6312 Unspecified lump in the right breast, upper inner quadrant: Secondary | ICD-10-CM | POA: Diagnosis not present

## 2022-12-20 DIAGNOSIS — N6321 Unspecified lump in the left breast, upper outer quadrant: Secondary | ICD-10-CM | POA: Diagnosis not present

## 2022-12-20 DIAGNOSIS — N6341 Unspecified lump in right breast, subareolar: Secondary | ICD-10-CM | POA: Diagnosis not present

## 2022-12-20 DIAGNOSIS — N6320 Unspecified lump in the left breast, unspecified quadrant: Secondary | ICD-10-CM

## 2022-12-20 DIAGNOSIS — N6310 Unspecified lump in the right breast, unspecified quadrant: Secondary | ICD-10-CM

## 2022-12-22 ENCOUNTER — Inpatient Hospital Stay: Payer: Medicare Other | Attending: Hematology and Oncology | Admitting: Hematology and Oncology

## 2022-12-22 ENCOUNTER — Ambulatory Visit (INDEPENDENT_AMBULATORY_CARE_PROVIDER_SITE_OTHER): Payer: Medicare Other | Admitting: Internal Medicine

## 2022-12-22 VITALS — BP 121/81 | HR 72 | Temp 97.9°F | Resp 18 | Ht 61.0 in | Wt 136.9 lb

## 2022-12-22 VITALS — BP 120/80 | HR 63 | Temp 98.1°F | Ht 61.0 in | Wt 137.4 lb

## 2022-12-22 DIAGNOSIS — J438 Other emphysema: Secondary | ICD-10-CM | POA: Diagnosis not present

## 2022-12-22 DIAGNOSIS — I7121 Aneurysm of the ascending aorta, without rupture: Secondary | ICD-10-CM | POA: Diagnosis not present

## 2022-12-22 DIAGNOSIS — D649 Anemia, unspecified: Secondary | ICD-10-CM

## 2022-12-22 DIAGNOSIS — B0229 Other postherpetic nervous system involvement: Secondary | ICD-10-CM | POA: Diagnosis not present

## 2022-12-22 DIAGNOSIS — D75839 Thrombocytosis, unspecified: Secondary | ICD-10-CM | POA: Diagnosis not present

## 2022-12-22 DIAGNOSIS — I999 Unspecified disorder of circulatory system: Secondary | ICD-10-CM

## 2022-12-22 DIAGNOSIS — R911 Solitary pulmonary nodule: Secondary | ICD-10-CM | POA: Diagnosis not present

## 2022-12-22 DIAGNOSIS — D473 Essential (hemorrhagic) thrombocythemia: Secondary | ICD-10-CM

## 2022-12-22 DIAGNOSIS — F32A Depression, unspecified: Secondary | ICD-10-CM | POA: Insufficient documentation

## 2022-12-22 DIAGNOSIS — R928 Other abnormal and inconclusive findings on diagnostic imaging of breast: Secondary | ICD-10-CM

## 2022-12-22 DIAGNOSIS — R195 Other fecal abnormalities: Secondary | ICD-10-CM

## 2022-12-22 DIAGNOSIS — R21 Rash and other nonspecific skin eruption: Secondary | ICD-10-CM | POA: Diagnosis not present

## 2022-12-22 MED ORDER — LIDOCAINE 5 % EX OINT: 1.0000 | TOPICAL_OINTMENT | CUTANEOUS | 0 refills | Status: DC | PRN

## 2022-12-22 NOTE — Assessment & Plan Note (Signed)
Lab: 06/18/21:  Hb 11.2, Platelet 778 07/30/2021: Hemoglobin 12.9, platelets 783, iron saturation 13%, ferritin 7  10/01/2021: Hemoglobin 14.1, platelets 828, ferritin 22 11/02/2021: Hemoglobin 12.9, platelets 435, ferritin 22, iron saturation 54%  04/06/2022: Platelets 763 05/20/2022: Platelets 782, hemoglobin 13.2, iron saturation 64%, ferritin 42, folate 13.3, B12 222 06/09/22: platelets 826 (shingles) 06/21/2022: Platelets 701, iron saturation 66%, ferritin 31 07/07/2022: Platelets 664, ferritin 34, iron saturation 56%, hemoglobin 14.6 08/15/2022: Platelets 857 08/26/2022: Platelets 938, SPEP, KL ratio: Normal, rheumatoid factor: 10: Normal, ANA, SSA SSB: Negative 09/14/2022: Ferritin 30, iron saturation 56%, hemoglobin 13.8, platelets 951   Severe palpitations: Related to anagrelide. D/C anagrelide and her palpitations have mostly resolved.   Current treatment: Since the patient is intolerant to anagrelide and hydroxyurea, currently on Jakafi at the low-dose of 5 mg twice a day.  Started 09/18/2022  Jakafi toxicities: Denies any major adverse effects to Jakafi Occasional nightmares   Hemochromatosis: On periodic phlebotomies, recommended to be done once a month from this point.   Return to clinic in 2 months with labs and follow-up

## 2022-12-22 NOTE — Progress Notes (Signed)
Please confirm with patient that the plan for breast biopsy is moving forward, and offer to help in any way we can.

## 2022-12-22 NOTE — Progress Notes (Signed)
[facility]: [phone]   -- Medical Office Visit --  Patient:  Kara [name]      Age: 72 y.o.       Sex:  female  Date:   12/22/2022 Patient Care Team: Lula Olszewski, MD as PCP - General (Internal Medicine) Serena Croissant, MD as Consulting Physician (Hematology and Oncology) Bensimhon, Bevelyn Buckles, MD as Consulting Physician (Cardiology) Today's Healthcare Provider: Lula Olszewski, MD      Assessment & Plan Postherpetic neuralgia Bad reaction to gabapentin so not using declines more She Feels opioids unwarranted but I was willing to prescribe, she is sleeping ok despite the pain. She experiences persistent chest wall pain following a shingles rash, with poor tolerance to Gabapentin but some relief from ibuprofen, acetaminophen, aspirin, and topical cortisone. We will continue the current regimen as tolerated and order Lidocaine ointment for additional topical pain relief. Abnormal mammogram of both breasts Discussed she will definitely get this scheduled and let me know A recent sonogram revealed fibroadenomas, and a biopsy is scheduled for further evaluation. We will await biopsy results for further management. Lung nodule An 8mm lesion in the left upper lobe was identified on lung cancer screening, necessitating close follow-up due to her history of smoking. We will repeat the CT scan in 3 months to assess for growth. Aneurysm of ascending aorta without rupture [facility]) She assures me her hematologist will arrange vascular referral we have already placed yearly CT angiogram orders Other emphysema (HCC) Her history of smoking has led to evidence of emphysema and scarring on a CT scan. We have referred her to Pulmonology for further evaluation and management. Positive colorectal cancer screening using Cologuard test Positive Cologuard Test A positive result indicates potential precancerous polyps. A colonoscopy with Dr. Adela Lank has been  scheduled. Vasculopathy Her history of hemochromatosis and elevated platelets has prompted a referral to Vascular Surgery for further evaluation which she says hematologist will manage so defers our offer to facilitate Low hemoglobin Advised patient I don't think this is associated with her fatigue complaints due to not severe  Lab Results  Component Value Date   HGB 11.8 (L) 12/16/2022     We will follow up in 2-3 months to ensure no lapses in care and to discuss the results of pending tests and referrals.     Diagnoses and all orders for this visit: Lung nodule -     Ambulatory referral to Pulmonology Postherpetic neuralgia -     lidocaine (XYLOCAINE) 5 % ointment; Apply 1 Application topically as needed. Abnormal mammogram of both breasts Aneurysm of ascending aorta without rupture (HCC) Other emphysema (HCC) Positive colorectal cancer screening using Cologuard test Vasculopathy   Medical Decision Making: 2 or more stable chronic illnesses     Review of the result(s) of each unique test;  Decision regarding minor surgery with identified patient or procedure risk factors  discussed importance of completing/scheduling biopsy which we have already ordered. 36 minutes was spent in direct face-to-face counseling regarding multiple recently identified potentially serious medical conditions.         Subjective   72 y.o. female who has Benign paroxysmal positional vertigo; Hereditary hemochromatosis (HCC); Essential thrombocytosis (HCC); Nasal obstruction; Facial numbness; Post herpetic neuralgia; History of smoking 25-50 pack years; Night sweats; Malaise; Intrinsic aging of facial skin; Skin rash; Positive colorectal cancer screening using Cologuard test; Emphysema lung (HCC); Aortic aneurysm, thoracic (HCC); Lung nodules; Aortic atherosclerosis (HCC); and Abnormal mammogram of both breasts on their problem list.  Her reasons/main concerns/chief complaints for today's office visit are  Discuss possible shingles (On right side of chest, very painful and internally (constant). Radiates to underarm sometimes. Since 2/11.) and Discuss lab results   ------------------------------------------------------------------------------------------------------------------------ AI-Extracted: Discussed the use of AI scribe software for clinical note transcription with the patient, who gave verbal consent to proceed.  History of Present Illness   The patient, with a history of emphysema and hemochromatosis, presents with persistent chest wall pain, suspected to be postherpetic neuralgia following a shingles outbreak. The pain radiates from the chest wall to under the arm. The patient reports a negative reaction to gabapentin and has been managing the pain with ibuprofen, acetaminophen, and aspirin, supplemented with cortisone and lidocaine ointments.  The patient also reports facial numbness, which she believes may be related to the chest pain. A CT scan was conducted, revealing an 8mm lesion in the left upper lobe of the lung, suspected to be a pulmonary nodule.  In addition to these concerns, the patient has been experiencing low hemoglobin levels, which she believes may be contributing to occasional shortness of breath. The patient also mentions a recent sonogram for breast lumps, with a biopsy scheduled for further investigation.  The patient's mother, under her care, has been managing well with dietary changes and physical therapy. The patient has been actively involved in coordinating her mother's care, including arranging for a nebulizer to manage her breathing problems.  The patient is a former smoker and has been proactive in managing her health, including scheduling follow-up appointments and advocating for her preferred treatments. She has expressed a preference for waiting for preferred specialists for procedures, such as a colonoscopy and a potential lung biopsy.     She has a past  medical history of Allergy (Unknown), Anemia (Unknown), Cataract (One month), Clotting disorder (HCC) (Unknown), Hemochromatosis, Hypertension (Unknown), Orthostatic hypotension (05/20/2016), and Sleep apnea (Unknown).  Problem list overviews that were updated at today's visit: Problem  Abnormal Mammogram of Both Breasts   Abnormal mammogram findings (ICD-10: R92.8) Recent mammogram revealed areas requiring further evaluation in both breasts. BI-RADS Category 0 (Incomplete) assigned. Ultrasound of both breasts recommended (Code: FI-B-69M) for complete assessment. Date of mammogram: [Insert date]. This finding necessitates close follow-up and may be associated with various breast conditions, ranging from benign changes to potential malignancies.  Related problems: 1. Breast mass, unspecified (ICD-10: N63) Mammogram suggests possible masses in both breasts. Exact nature and characteristics pending ultrasound evaluation. This finding warrants thorough investigation to rule out malignancy and determine appropriate management.  2. Need for other imaging of breast (ICD-10: N82.956) Follow-up breast ultrasound required based on recent mammogram findings. This additional imaging is crucial for a comprehensive evaluation of the breast tissue and to guide further management decisions.  Suggested related codes: - Personal history of breast cancer (ICD-10: Z85.3), if applicable - Family history of breast cancer (ICD-10: Z80.3), if relevant to patient history  These problem list updates reflect the current status based on recent mammogram results. They highlight the need for further evaluation and close monitoring. The upcoming ultrasound will provide more detailed information to refine these assessments and guide the next steps in patient care.   Aortic Aneurysm, Thoracic (Hcc)   - Thoracic aortic aneurysm (I71.2):   New: 4.3 cm ascending thoracic aortic aneurysm noted on CT on 11/09/22. Annual imaging  follow-up recommended. Vascular specialist referral recommended.    Current Outpatient Medications on File Prior to Visit  Medication Sig   aspirin EC 81 MG tablet Take  1 tablet (81 mg total) by mouth daily.   ruxolitinib phosphate (JAKAFI) 5 MG tablet Take 1 tablet (5 mg total) by mouth 2 (two) times daily.   No current facility-administered medications on file prior to visit.  There are no discontinued medications.   Objective   Physical Exam  BP 120/80 (BP Location: Left Arm, Patient Position: Sitting)   Pulse 63   Temp 98.1 F (36.7 C) (Temporal)   Ht 5\' 1"  (1.549 m)   Wt 137 lb 6.4 oz (62.3 kg)   SpO2 97%   BMI 25.96 kg/m  Wt Readings from Last 10 Encounters:  12/22/22 137 lb 6.4 oz (62.3 kg)  12/22/22 136 lb 14.4 oz (62.1 kg)  11/24/22 131 lb (59.4 kg)  11/18/22 131 lb 4.8 oz (59.6 kg)  10/12/22 131 lb 12.8 oz (59.8 kg)  09/26/22 128 lb 6.4 oz (58.2 kg)  09/22/22 129 lb 6.4 oz (58.7 kg)  09/16/22 130 lb 1.6 oz (59 kg)  08/18/22 124 lb 11.2 oz (56.6 kg)  07/21/22 125 lb 4.8 oz (56.8 kg)  Vital signs reviewed.  Nursing notes reviewed. Weight trend reviewed.  General Appearance:  No acute distress appreciable.   Well-groomed, healthy-appearing female.  Well proportioned with no abnormal fat distribution.  Good muscle tone. Pulmonary:  Normal work of breathing at rest, no respiratory distress apparent. SpO2: 97 %  Musculoskeletal: All extremities are intact.  Neurological:  Awake, alert, oriented, and engaged.  No obvious focal neurological deficits or cognitive impairments.  Sensorium seems unclouded.   Speech is clear and coherent with logical content. Psychiatric:  Appropriate mood, pleasant and cooperative demeanor, thoughtful and engaged during the exam  Results   LABS Hemoglobin: 11.8 g/dL  RADIOLOGY Chest x-ray: mass in breast Breast ultrasound: fibroadenomas (12/21/2022) CT scan: Lung-RADS 4A, suspicious 8 mm lesion in left upper lobe  (11/09/2022)  DIAGNOSTIC Cologuard: positive        No results found for any visits on 12/22/22.  Appointment on 12/16/2022  Component Date Value   WBC Count 12/16/2022 8.1    RBC 12/16/2022 3.79 (L)    Hemoglobin 12/16/2022 11.8 (L)    HCT 12/16/2022 35.8 (L)    MCV 12/16/2022 94.5    MCH 12/16/2022 31.1    MCHC 12/16/2022 33.0    RDW 12/16/2022 15.0    Platelet Count 12/16/2022 725 (H)    nRBC 12/16/2022 0.0    Neutrophils Relative % 12/16/2022 61    Neutro Abs 12/16/2022 4.9    Lymphocytes Relative 12/16/2022 27    Lymphs Abs 12/16/2022 2.2    Monocytes Relative 12/16/2022 8    Monocytes Absolute 12/16/2022 0.7    Eosinophils Relative 12/16/2022 3    Eosinophils Absolute 12/16/2022 0.3    Basophils Relative 12/16/2022 1    Basophils Absolute 12/16/2022 0.0    Immature Granulocytes 12/16/2022 0    Abs Immature Granulocytes 12/16/2022 0.03    Ferritin 12/16/2022 12    Iron 12/16/2022 128    TIBC 12/16/2022 330    Saturation Ratios 12/16/2022 39 (H)    UIBC 12/16/2022 202    Sodium 12/16/2022 138    Potassium 12/16/2022 4.1    Chloride 12/16/2022 108    CO2 12/16/2022 22    Glucose, Bld 12/16/2022 103 (H)    BUN 12/16/2022 17    Creatinine 12/16/2022 0.67    Calcium 12/16/2022 9.3    Total Protein 12/16/2022 6.9    Albumin 12/16/2022 4.5    AST 12/16/2022 16  ALT 12/16/2022 9    Alkaline Phosphatase 12/16/2022 64    Total Bilirubin 12/16/2022 0.3    GFR, Estimated 12/16/2022 >60    Anion gap 12/16/2022 8   Appointment on 11/18/2022  Component Date Value   Sodium 11/18/2022 137    Potassium 11/18/2022 4.1    Chloride 11/18/2022 105    CO2 11/18/2022 23    Glucose, Bld 11/18/2022 88    BUN 11/18/2022 17    Creatinine 11/18/2022 0.66    Calcium 11/18/2022 9.6    Total Protein 11/18/2022 7.2    Albumin 11/18/2022 4.9    AST 11/18/2022 19    ALT 11/18/2022 9    Alkaline Phosphatase 11/18/2022 56    Total Bilirubin 11/18/2022 0.5    GFR, Estimated  11/18/2022 >60    Anion gap 11/18/2022 9    Iron 11/18/2022 177 (H)    TIBC 11/18/2022 265    Saturation Ratios 11/18/2022 67 (H)    UIBC 11/18/2022 88 (L)    Ferritin 11/18/2022 43    WBC Count 11/18/2022 9.3    RBC 11/18/2022 4.27    Hemoglobin 11/18/2022 13.3    HCT 11/18/2022 39.0    MCV 11/18/2022 91.3    MCH 11/18/2022 31.1    MCHC 11/18/2022 34.1    RDW 11/18/2022 14.5    Platelet Count 11/18/2022 739 (H)    nRBC 11/18/2022 0.0    Neutrophils Relative % 11/18/2022 61    Neutro Abs 11/18/2022 5.7    Lymphocytes Relative 11/18/2022 29    Lymphs Abs 11/18/2022 2.7    Monocytes Relative 11/18/2022 7    Monocytes Absolute 11/18/2022 0.6    Eosinophils Relative 11/18/2022 3    Eosinophils Absolute 11/18/2022 0.2    Basophils Relative 11/18/2022 0    Basophils Absolute 11/18/2022 0.0    Immature Granulocytes 11/18/2022 0    Abs Immature Granulocytes 11/18/2022 0.03   Appointment on 10/12/2022  Component Date Value   Sodium 10/12/2022 138    Potassium 10/12/2022 4.2    Chloride 10/12/2022 105    CO2 10/12/2022 25    Glucose, Bld 10/12/2022 111 (H)    BUN 10/12/2022 16    Creatinine 10/12/2022 0.63    Calcium 10/12/2022 9.4    Total Protein 10/12/2022 7.1    Albumin 10/12/2022 4.4    AST 10/12/2022 17    ALT 10/12/2022 9    Alkaline Phosphatase 10/12/2022 70    Total Bilirubin 10/12/2022 0.3    GFR, Estimated 10/12/2022 >60    Anion gap 10/12/2022 8    Iron 10/12/2022 122    TIBC 10/12/2022 315    Saturation Ratios 10/12/2022 39 (H)    UIBC 10/12/2022 193    Ferritin 10/12/2022 29    WBC Count 10/12/2022 10.1    RBC 10/12/2022 3.76 (L)    Hemoglobin 10/12/2022 11.7 (L)    HCT 10/12/2022 34.9 (L)    MCV 10/12/2022 92.8    MCH 10/12/2022 31.1    MCHC 10/12/2022 33.5    RDW 10/12/2022 15.3    Platelet Count 10/12/2022 769 (H)    nRBC 10/12/2022 0.0    Neutrophils Relative % 10/12/2022 63    Neutro Abs 10/12/2022 6.3    Lymphocytes Relative 10/12/2022 27     Lymphs Abs 10/12/2022 2.7    Monocytes Relative 10/12/2022 6    Monocytes Absolute 10/12/2022 0.6    Eosinophils Relative 10/12/2022 3    Eosinophils Absolute 10/12/2022 0.3    Basophils Relative 10/12/2022 1  Basophils Absolute 10/12/2022 0.1    Immature Granulocytes 10/12/2022 0    Abs Immature Granulocytes 10/12/2022 0.02   Office Visit on 09/26/2022  Component Date Value   COLOGUARD 10/26/2022 Positive (A)   Appointment on 09/22/2022  Component Date Value   Sodium 09/22/2022 137    Potassium 09/22/2022 4.2    Chloride 09/22/2022 106    CO2 09/22/2022 24    Glucose, Bld 09/22/2022 98    BUN 09/22/2022 12    Creatinine 09/22/2022 0.60    Calcium 09/22/2022 9.3    Total Protein 09/22/2022 7.1    Albumin 09/22/2022 4.6    AST 09/22/2022 18    ALT 09/22/2022 9    Alkaline Phosphatase 09/22/2022 68    Total Bilirubin 09/22/2022 0.4    GFR, Estimated 09/22/2022 >60    Anion gap 09/22/2022 7    WBC Count 09/22/2022 9.8    RBC 09/22/2022 4.07    Hemoglobin 09/22/2022 12.5    HCT 09/22/2022 37.0    MCV 09/22/2022 90.9    MCH 09/22/2022 30.7    MCHC 09/22/2022 33.8    RDW 09/22/2022 15.2    Platelet Count 09/22/2022 908 (HH)    nRBC 09/22/2022 0.0    Neutrophils Relative % 09/22/2022 63    Neutro Abs 09/22/2022 6.3    Lymphocytes Relative 09/22/2022 26    Lymphs Abs 09/22/2022 2.5    Monocytes Relative 09/22/2022 7    Monocytes Absolute 09/22/2022 0.7    Eosinophils Relative 09/22/2022 3    Eosinophils Absolute 09/22/2022 0.2    Basophils Relative 09/22/2022 1    Basophils Absolute 09/22/2022 0.1    Immature Granulocytes 09/22/2022 0    Abs Immature Granulocytes 09/22/2022 0.03   Appointment on 09/14/2022  Component Date Value   Iron 09/14/2022 156    TIBC 09/14/2022 277    Saturation Ratios 09/14/2022 56 (H)    UIBC 09/14/2022 121 (L)    Ferritin 09/14/2022 30   Appointment on 09/14/2022  Component Date Value   Sodium 09/14/2022 139    Potassium 09/14/2022  4.1    Chloride 09/14/2022 107    CO2 09/14/2022 24    Glucose, Bld 09/14/2022 94    BUN 09/14/2022 18    Creatinine 09/14/2022 0.53    Calcium 09/14/2022 9.3    Total Protein 09/14/2022 7.3    Albumin 09/14/2022 4.7    AST 09/14/2022 14 (L)    ALT 09/14/2022 8    Alkaline Phosphatase 09/14/2022 79    Total Bilirubin 09/14/2022 0.5    GFR, Estimated 09/14/2022 >60    Anion gap 09/14/2022 8    WBC Count 09/14/2022 9.8    RBC 09/14/2022 4.47    Hemoglobin 09/14/2022 13.8    HCT 09/14/2022 40.6    MCV 09/14/2022 90.8    MCH 09/14/2022 30.9    MCHC 09/14/2022 34.0    RDW 09/14/2022 15.4    Platelet Count 09/14/2022 951 (HH)    nRBC 09/14/2022 0.0    Neutrophils Relative % 09/14/2022 64    Neutro Abs 09/14/2022 6.3    Lymphocytes Relative 09/14/2022 22    Lymphs Abs 09/14/2022 2.2    Monocytes Relative 09/14/2022 8    Monocytes Absolute 09/14/2022 0.8    Eosinophils Relative 09/14/2022 5    Eosinophils Absolute 09/14/2022 0.5    Basophils Relative 09/14/2022 1    Basophils Absolute 09/14/2022 0.1    Immature Granulocytes 09/14/2022 0    Abs Immature Granulocytes 09/14/2022 0.04   Appointment on  08/26/2022  Component Date Value   SSA (Ro) (ENA) Antibody,* 08/26/2022 <0.2    SSB (La) (ENA) Antibody,* 08/26/2022 <0.2    WBC Count 08/26/2022 10.6 (H)    RBC 08/26/2022 4.36    Hemoglobin 08/26/2022 13.4    HCT 08/26/2022 40.0    MCV 08/26/2022 91.7    MCH 08/26/2022 30.7    MCHC 08/26/2022 33.5    RDW 08/26/2022 15.2    Platelet Count 08/26/2022 938 (HH)    nRBC 08/26/2022 0.0    Neutrophils Relative % 08/26/2022 60    Neutro Abs 08/26/2022 6.4    Lymphocytes Relative 08/26/2022 24    Lymphs Abs 08/26/2022 2.6    Monocytes Relative 08/26/2022 9    Monocytes Absolute 08/26/2022 1.0    Eosinophils Relative 08/26/2022 6    Eosinophils Absolute 08/26/2022 0.7 (H)    Basophils Relative 08/26/2022 1    Basophils Absolute 08/26/2022 0.1    Immature Granulocytes 08/26/2022  0    Abs Immature Granulocytes 08/26/2022 0.04    Kappa free light chain 08/26/2022 13.5    Lambda free light chains 08/26/2022 10.2    Kappa, lambda light chai* 08/26/2022 1.32    IgG (Immunoglobin G), Se* 08/26/2022 640    IgA 08/26/2022 279    IgM (Immunoglobulin M), * 08/26/2022 31    Total Protein ELP 08/26/2022 6.7    Albumin SerPl Elph-Mcnc 08/26/2022 4.4    Alpha 1 08/26/2022 0.3    Alpha2 Glob SerPl Elph-M* 08/26/2022 0.6    B-Globulin SerPl Elph-Mc* 08/26/2022 1.0    Gamma Glob SerPl Elph-Mc* 08/26/2022 0.5    M Protein SerPl Elph-Mcnc 08/26/2022 Not Observed    Globulin, Total 08/26/2022 2.3    Albumin/Glob SerPl 08/26/2022 2.0 (H)    IFE 1 08/26/2022 Comment    Please Note 08/26/2022 Comment    Rheumatoid fact SerPl-aC* 08/26/2022 10.0    ANA Ab, IFA 08/26/2022 Negative   Appointment on 08/15/2022  Component Date Value   Iron 08/15/2022 143    TIBC 08/15/2022 302    Saturation Ratios 08/15/2022 47 (H)    UIBC 08/15/2022 159    Ferritin 08/15/2022 31    WBC Count 08/15/2022 10.2    RBC 08/15/2022 4.45    Hemoglobin 08/15/2022 13.8    HCT 08/15/2022 40.2    MCV 08/15/2022 90.3    MCH 08/15/2022 31.0    MCHC 08/15/2022 34.3    RDW 08/15/2022 14.8    Platelet Count 08/15/2022 857 (H)    nRBC 08/15/2022 0.0    Neutrophils Relative % 08/15/2022 62    Neutro Abs 08/15/2022 6.3    Lymphocytes Relative 08/15/2022 25    Lymphs Abs 08/15/2022 2.6    Monocytes Relative 08/15/2022 7    Monocytes Absolute 08/15/2022 0.8    Eosinophils Relative 08/15/2022 5    Eosinophils Absolute 08/15/2022 0.5    Basophils Relative 08/15/2022 1    Basophils Absolute 08/15/2022 0.1    Immature Granulocytes 08/15/2022 0    Abs Immature Granulocytes 08/15/2022 0.03   Appointment on 07/15/2022  Component Date Value   Sodium 07/15/2022 135    Potassium 07/15/2022 4.1    Chloride 07/15/2022 103    CO2 07/15/2022 25    Glucose, Bld 07/15/2022 94    BUN 07/15/2022 12    Creatinine  07/15/2022 0.58    Calcium 07/15/2022 9.4    Total Protein 07/15/2022 6.9    Albumin 07/15/2022 4.5    AST 07/15/2022 16    ALT 07/15/2022  9    Alkaline Phosphatase 07/15/2022 64    Total Bilirubin 07/15/2022 0.4    GFR, Estimated 07/15/2022 >60    Anion gap 07/15/2022 7    WBC Count 07/15/2022 8.1    RBC 07/15/2022 4.31    Hemoglobin 07/15/2022 13.6    HCT 07/15/2022 39.9    MCV 07/15/2022 92.6    MCH 07/15/2022 31.6    MCHC 07/15/2022 34.1    RDW 07/15/2022 14.1    Platelet Count 07/15/2022 735 (H)    nRBC 07/15/2022 0.0    Neutrophils Relative % 07/15/2022 65    Neutro Abs 07/15/2022 5.2    Lymphocytes Relative 07/15/2022 24    Lymphs Abs 07/15/2022 2.0    Monocytes Relative 07/15/2022 7    Monocytes Absolute 07/15/2022 0.6    Eosinophils Relative 07/15/2022 3    Eosinophils Absolute 07/15/2022 0.2    Basophils Relative 07/15/2022 1    Basophils Absolute 07/15/2022 0.0    Immature Granulocytes 07/15/2022 0    Abs Immature Granulocytes 07/15/2022 0.03   There may be more visits with results that are not included.   No image results found.   [place] LIMITED ULTRASOUND INCLUDING AXILLA LEFT BREAST   Result Date: 12/20/2022 CLINICAL DATA:  72 year old female recalled from screening for possible bilateral breast masses. EXAM: ULTRASOUND OF THE BILATERAL BREASTS COMPARISON:  Previous exam(s). FINDINGS: Ultrasound targeted to the right breast at 12 o'clock, 1 cm from the nipple demonstrates a heterogeneous oval mass with indistinct margins measuring 1.6 x 0.8 x 1.3 cm. A second mass in the right breast at 2 o'clock, 4 cm from the nipple has a similar appearance and measures 1.5 x 0.5 x 0.9 cm. Ultrasound of the right axilla demonstrates multiple normal-appearing lymph nodes. Ultrasound targeted to the left breast at 1 o'clock, 6 cm from the nipple demonstrates a heterogeneous oval mass with indistinct margins measuring 1.8 x 0.8 x 1.5 cm. Ultrasound of the left axilla demonstrates  multiple normal-appearing lymph nodes. IMPRESSION: 1. There are 2 masses in the right breast and low 1 left breast mass that are indeterminate, but possibly representing fibroadenomas. 2.  No evidence of bilateral axillary lymphadenopathy. RECOMMENDATION: Ultrasound guided biopsy is recommended for the bilateral breast masses. I have discussed the findings and recommendations with the patient. If applicable, a reminder letter will be sent to the patient regarding the next appointment. BI-RADS CATEGORY  4: Suspicious. Electronically Signed   By: Frederico Hamman M.D.   On: 12/20/2022 10:24   [place] LIMITED ULTRASOUND INCLUDING AXILLA RIGHT BREAST  Result Date: 12/20/2022 CLINICAL DATA:  72 year old female recalled from screening for possible bilateral breast masses. EXAM: ULTRASOUND OF THE BILATERAL BREASTS COMPARISON:  Previous exam(s). FINDINGS: Ultrasound targeted to the right breast at 12 o'clock, 1 cm from the nipple demonstrates a heterogeneous oval mass with indistinct margins measuring 1.6 x 0.8 x 1.3 cm. A second mass in the right breast at 2 o'clock, 4 cm from the nipple has a similar appearance and measures 1.5 x 0.5 x 0.9 cm. Ultrasound of the right axilla demonstrates multiple normal-appearing lymph nodes. Ultrasound targeted to the left breast at 1 o'clock, 6 cm from the nipple demonstrates a heterogeneous oval mass with indistinct margins measuring 1.8 x 0.8 x 1.5 cm. Ultrasound of the left axilla demonstrates multiple normal-appearing lymph nodes. IMPRESSION: 1. There are 2 masses in the right breast and low 1 left breast mass that are indeterminate, but possibly representing fibroadenomas. 2.  No evidence of bilateral axillary lymphadenopathy.  RECOMMENDATION: Ultrasound guided biopsy is recommended for the bilateral breast masses. I have discussed the findings and recommendations with the patient. If applicable, a reminder letter will be sent to the patient regarding the next appointment. BI-RADS  CATEGORY  4: Suspicious. Electronically Signed   By: Frederico Hamman M.D.   On: 12/20/2022 10:24   MM 3D SCREENING MAMMOGRAM BILATERAL BREAST  Result Date: 12/06/2022 CLINICAL DATA:  Screening. EXAM: DIGITAL SCREENING BILATERAL MAMMOGRAM WITH TOMOSYNTHESIS AND CAD TECHNIQUE: Bilateral screening digital craniocaudal and mediolateral oblique mammograms were obtained. Bilateral screening digital breast tomosynthesis was performed. The images were evaluated with computer-aided detection. COMPARISON:  None. ACR Breast Density Category c: The breasts are heterogeneously dense, which may obscure small masses. FINDINGS: In the right breast masses requires further evaluation. In the left breast a mass requires further evaluation. IMPRESSION: Further evaluation is suggested for possible masses in the right breast. Further evaluation is suggested for possible mass in the left breast. RECOMMENDATION: Ultrasound of both breasts. (Code:FI-B-81M) The patient will be contacted regarding the findings, and additional imaging will be scheduled. BI-RADS CATEGORY  0: Incomplete: Need additional imaging evaluation. Electronically Signed   By: Edwin Cap M.D.   On: 12/06/2022 10:16   Low Dose CT Chest w/o Contrast for Lung Cancer Screening [[id number]]  Result Date: 11/15/2022 CLINICAL DATA:  72 year old female with 13 pack-year history of smoking. Lung cancer screening. EXAM: CT CHEST WITHOUT CONTRAST LOW-DOSE FOR LUNG CANCER SCREENING TECHNIQUE: Multidetector CT imaging of the chest was performed following the standard protocol without IV contrast. RADIATION DOSE REDUCTION: This exam was performed according to the departmental dose-optimization program which includes automated exposure control, adjustment of the mA and/or kV according to patient size and/or use of iterative reconstruction technique. COMPARISON:  No comparison studies available. FINDINGS: Cardiovascular: The heart size is normal. No substantial pericardial  effusion. Ascending thoracic aorta measures 4.3 cm diameter. Mild atherosclerotic calcification is noted in the wall of the thoracic aorta. Mediastinum/Nodes: No mediastinal lymphadenopathy. No evidence for gross hilar lymphadenopathy although assessment is limited by the lack of intravenous contrast on the current study. The esophagus has normal imaging features. There is no axillary lymphadenopathy. Lungs/Pleura: Centrilobular emphsyema noted. Atelectasis or scarring noted anterior right lung with probable subsegmental atelectasis in the dependent lung bases bilaterally. Atelectasis or scarring is seen in the inferior lingula. Scattered tiny pulmonary nodules are identified in both lungs. The dominant nodule on this study is in the left upper lobe measuring 8 mm on image 120. No focal airspace consolidation. No pleural effusion. Upper Abdomen: Visualized portion of the upper abdomen is unremarkable. Musculoskeletal: No worrisome lytic or sclerotic osseous abnormality. IMPRESSION: 1. Lung-RADS 4A, suspicious. 8 mm left upper lobe pulmonary nodule. Follow up low-dose chest CT without contrast in 3 months (please use the following order, "CT CHEST LCS NODULE FOLLOW-UP W/O CM") is recommended. Alternatively, PET may be considered when there is a solid component 8mm or larger. 2. 4.3 cm diameter ascending thoracic aortic aneurysm. Recommend annual imaging followup by CTA or MRA. This recommendation follows 2010 ACCF/AHA/AATS/ACR/ASA/SCA/SCAI/SIR/STS/SVM Guidelines for the Diagnosis and Management of Patients with Thoracic Aortic Disease. Circulation. 2010; 121: J478-G956. Aortic aneurysm NOS (ICD10-I71.9) 3.  Emphysema (ICD10-J43.9) and Aortic Atherosclerosis (ICD10-170.0) Electronically Signed   By: Kennith Center M.D.   On: 11/15/2022 16:43    [place] LIMITED ULTRASOUND INCLUDING AXILLA LEFT BREAST   Result Date: 12/20/2022 CLINICAL DATA:  72 year old female recalled from screening for possible bilateral breast masses.  EXAM: ULTRASOUND OF THE BILATERAL  BREASTS COMPARISON:  Previous exam(s). FINDINGS: Ultrasound targeted to the right breast at 12 o'clock, 1 cm from the nipple demonstrates a heterogeneous oval mass with indistinct margins measuring 1.6 x 0.8 x 1.3 cm. A second mass in the right breast at 2 o'clock, 4 cm from the nipple has a similar appearance and measures 1.5 x 0.5 x 0.9 cm. Ultrasound of the right axilla demonstrates multiple normal-appearing lymph nodes. Ultrasound targeted to the left breast at 1 o'clock, 6 cm from the nipple demonstrates a heterogeneous oval mass with indistinct margins measuring 1.8 x 0.8 x 1.5 cm. Ultrasound of the left axilla demonstrates multiple normal-appearing lymph nodes. IMPRESSION: 1. There are 2 masses in the right breast and low 1 left breast mass that are indeterminate, but possibly representing fibroadenomas. 2.  No evidence of bilateral axillary lymphadenopathy. RECOMMENDATION: Ultrasound guided biopsy is recommended for the bilateral breast masses. I have discussed the findings and recommendations with the patient. If applicable, a reminder letter will be sent to the patient regarding the next appointment. BI-RADS CATEGORY  4: Suspicious. Electronically Signed   By: Frederico Hamman M.D.   On: 12/20/2022 10:24   [place] LIMITED ULTRASOUND INCLUDING AXILLA RIGHT BREAST  Result Date: 12/20/2022 CLINICAL DATA:  72 year old female recalled from screening for possible bilateral breast masses. EXAM: ULTRASOUND OF THE BILATERAL BREASTS COMPARISON:  Previous exam(s). FINDINGS: Ultrasound targeted to the right breast at 12 o'clock, 1 cm from the nipple demonstrates a heterogeneous oval mass with indistinct margins measuring 1.6 x 0.8 x 1.3 cm. A second mass in the right breast at 2 o'clock, 4 cm from the nipple has a similar appearance and measures 1.5 x 0.5 x 0.9 cm. Ultrasound of the right axilla demonstrates multiple normal-appearing lymph nodes. Ultrasound targeted to the left breast  at 1 o'clock, 6 cm from the nipple demonstrates a heterogeneous oval mass with indistinct margins measuring 1.8 x 0.8 x 1.5 cm. Ultrasound of the left axilla demonstrates multiple normal-appearing lymph nodes. IMPRESSION: 1. There are 2 masses in the right breast and low 1 left breast mass that are indeterminate, but possibly representing fibroadenomas. 2.  No evidence of bilateral axillary lymphadenopathy. RECOMMENDATION: Ultrasound guided biopsy is recommended for the bilateral breast masses. I have discussed the findings and recommendations with the patient. If applicable, a reminder letter will be sent to the patient regarding the next appointment. BI-RADS CATEGORY  4: Suspicious. Electronically Signed   By: Frederico Hamman M.D.   On: 12/20/2022 10:24       Additional Info: This encounter employed real-time, collaborative documentation. The patient actively reviewed and updated their medical record on a shared screen, ensuring transparency and facilitating joint problem-solving for the problem list, overview, and plan. This approach promotes accurate, informed care. The treatment plan was discussed and reviewed in detail, including medication safety, potential side effects, and all patient questions. We confirmed understanding and comfort with the plan. Follow-up instructions were established, including contacting the office for any concerns, returning if symptoms worsen, persist, or new symptoms develop, and precautions for potential emergency department visits.

## 2022-12-22 NOTE — Assessment & Plan Note (Signed)
She assures me her hematologist will arrange vascular referral we have already placed yearly CT angiogram orders

## 2022-12-22 NOTE — Progress Notes (Signed)
Patient Care Team: Lula Olszewski, MD as PCP - General (Internal Medicine) Serena Croissant, MD as Consulting Physician (Hematology and Oncology) Bensimhon, Bevelyn Buckles, MD as Consulting Physician (Cardiology)  DIAGNOSIS:  Encounter Diagnosis  Name Primary?   Essential thrombocytosis (HCC) Yes   CHIEF COMPLIANT: thrombocytosis and hereditary hemochromatosis   INTERVAL HISTORY: Kara [name] is a 72 y.o. female with a history of thrombocytosis and hereditary hemochromatosis. She presents to the clinic for a follow-up. Patient reports she doesn't sleep well. She does have a slight bit of depression. Pain has worsened at the site of the rash.  She has an appointment to see Dr. Jon Billings today.  She is hoping to get a referral to [facility] dermatology.   ALLERGIES:  is allergic to penicillins and tramadol.  MEDICATIONS:  Current Outpatient Medications  Medication Sig Dispense Refill   aspirin EC 81 MG tablet Take 1 tablet (81 mg total) by mouth daily.     ruxolitinib phosphate (JAKAFI) 5 MG tablet Take 1 tablet (5 mg total) by mouth 2 (two) times daily. 60 tablet 3   No current facility-administered medications for this visit.    PHYSICAL EXAMINATION: ECOG PERFORMANCE STATUS: 1 - Symptomatic but completely ambulatory  Vitals:   12/22/22 1031  BP: 121/81  Pulse: 72  Resp: 18  Temp: 97.9 F (36.6 C)  SpO2: 98%   Filed Weights   12/22/22 1031  Weight: 136 lb 14.4 oz (62.1 kg)      LABORATORY DATA:  I have reviewed the data as listed    Latest Ref Rng & Units 12/16/2022    3:22 PM 11/18/2022    8:17 AM 10/12/2022    2:17 PM  CMP  Glucose 70 - 99 mg/dL 161  88  096   BUN 8 - 23 mg/dL 17  17  16    Creatinine 0.44 - 1.00 mg/dL 0.45  4.09  8.11   Sodium 135 - 145 mmol/L 138  137  138   Potassium 3.5 - 5.1 mmol/L 4.1  4.1  4.2   Chloride 98 - 111 mmol/L 108  105  105   CO2 22 - 32 mmol/L 22  23  25    Calcium 8.9 - 10.3 mg/dL 9.3  9.6  9.4   Total Protein 6.5 - 8.1 g/dL 6.9  7.2   7.1   Total Bilirubin 0.3 - 1.2 mg/dL 0.3  0.5  0.3   Alkaline Phos 38 - 126 U/L 64  56  70   AST 15 - 41 U/L 16  19  17    ALT 0 - 44 U/L 9  9  9      Lab Results  Component Value Date   WBC 8.1 12/16/2022   HGB 11.8 (L) 12/16/2022   HCT 35.8 (L) 12/16/2022   MCV 94.5 12/16/2022   PLT 725 (H) 12/16/2022   NEUTROABS 4.9 12/16/2022    ASSESSMENT & PLAN:  Essential thrombocytosis (HCC) Lab: 06/18/21:  Hb 11.2, Platelet 778 07/30/2021: Hemoglobin 12.9, platelets 783, iron saturation 13%, ferritin 7  10/01/2021: Hemoglobin 14.1, platelets 828, ferritin 22 11/02/2021: Hemoglobin 12.9, platelets 435, ferritin 22, iron saturation 54%  04/06/2022: Platelets 763 05/20/2022: Platelets 782, hemoglobin 13.2, iron saturation 64%, ferritin 42, folate 13.3, B12 222 06/09/22: platelets 826 (shingles) 06/21/2022: Platelets 701, iron saturation 66%, ferritin 31 07/07/2022: Platelets 664, ferritin 34, iron saturation 56%, hemoglobin 14.6 08/15/2022: Platelets 857 08/26/2022: Platelets 938, SPEP, KL ratio: Normal, rheumatoid factor: 10: Normal, ANA, SSA SSB: Negative 09/14/2022: Ferritin 30,  iron saturation 56%, hemoglobin 13.8, platelets 951 12/22/2022: WBC 8.1, hemoglobin 11.8, platelets 725   Severe palpitations: Related to anagrelide. D/C anagrelide and her palpitations have mostly resolved.   Current treatment: Since the patient is intolerant to anagrelide and hydroxyurea, currently on Jakafi at the low-dose of 5 mg twice a day.  Started 09/18/2022  Jakafi toxicities: Denies any major adverse effects to Jakafi Occasional nightmares and difficulty with sleeping   Hemochromatosis: On periodic phlebotomies.  Phlebotomy was canceled on 12/16/2022.  With the ferritin of only 12 we can cancel the phlebotomy that is coming up later this month.   Return to clinic in 2 months with labs and follow-up    No orders of the defined types were placed in this encounter.  The patient has a good understanding of the  overall plan. she agrees with it. she will call with any problems that may develop before the next visit here. Total time spent: 30 mins including face to face time and time spent for planning, charting and co-ordination of care   Tamsen Meek, MD 12/22/22    I Janan Ridge am acting as a Neurosurgeon for [employer]  I have reviewed the above documentation for accuracy and completeness, and I agree with the above.

## 2022-12-22 NOTE — Assessment & Plan Note (Addendum)
Discussed she will definitely get this scheduled and let me know A recent sonogram revealed fibroadenomas, and a biopsy is scheduled for further evaluation. We will await biopsy results for further management.

## 2022-12-23 ENCOUNTER — Inpatient Hospital Stay: Payer: Medicare Other | Admitting: Hematology and Oncology

## 2022-12-23 ENCOUNTER — Other Ambulatory Visit: Payer: Self-pay

## 2022-12-23 DIAGNOSIS — J439 Emphysema, unspecified: Secondary | ICD-10-CM

## 2022-12-23 DIAGNOSIS — D473 Essential (hemorrhagic) thrombocythemia: Secondary | ICD-10-CM

## 2022-12-23 DIAGNOSIS — Z87891 Personal history of nicotine dependence: Secondary | ICD-10-CM

## 2022-12-23 DIAGNOSIS — I7121 Aneurysm of the ascending aorta, without rupture: Secondary | ICD-10-CM

## 2022-12-23 DIAGNOSIS — R918 Other nonspecific abnormal finding of lung field: Secondary | ICD-10-CM

## 2022-12-23 DIAGNOSIS — I700 Atherosclerosis of aorta: Secondary | ICD-10-CM

## 2022-12-23 NOTE — Assessment & Plan Note (Signed)
Her history of smoking has led to evidence of emphysema and scarring on a CT scan. We have referred her to Pulmonology for further evaluation and management.

## 2022-12-23 NOTE — Patient Instructions (Signed)
VISIT SUMMARY:  During your visit, we discussed several health concerns including persistent chest wall pain, a lesion in your lung, low hemoglobin levels, and lumps in your breast. We also discussed your history of emphysema and hemochromatosis. You have been proactive in managing your health and coordinating care for your mother. We have made several plans to address your concerns and ensure you receive the best possible care.  YOUR PLAN:  -POSTHERPETIC NEURALGIA: This is a condition where you experience pain after a shingles rash. We will continue your current pain management regimen and add Lidocaine ointment for additional relief.  -BREAST MASSES: These are lumps in your breast that were found during a sonogram. We have scheduled a biopsy for further evaluation and will discuss the results with you.  -LUNG NODULE: This is a small lesion in your lung that was found during a screening. We will repeat the CT scan in 3 months to check for any growth.  -EMPHYSEMA: This is a lung condition often caused by smoking. We have referred you to a lung specialist for further evaluation and management.  -POSITIVE COLOGUARD TEST: This test result indicates potential precancerous polyps in your colon. We have scheduled a colonoscopy for further evaluation.  -VASCULAR REFERRAL: Due to your history of hemochromatosis and elevated platelets, we have referred you to a vascular surgeon for further evaluation.  INSTRUCTIONS:  We will follow up in 2 months to ensure no lapses in care and to discuss the results of pending tests and referrals. Please continue to manage your health proactively and reach out if you have any concerns or questions.

## 2022-12-23 NOTE — Assessment & Plan Note (Signed)
Positive Cologuard Test A positive result indicates potential precancerous polyps. A colonoscopy with Dr. Adela Lank has been scheduled.

## 2022-12-28 ENCOUNTER — Telehealth: Payer: Self-pay | Admitting: Internal Medicine

## 2022-12-28 ENCOUNTER — Other Ambulatory Visit: Payer: Self-pay | Admitting: Internal Medicine

## 2022-12-28 DIAGNOSIS — R911 Solitary pulmonary nodule: Secondary | ICD-10-CM

## 2022-12-28 NOTE — Telephone Encounter (Signed)
Patient requests to be called re: personal reason

## 2022-12-29 ENCOUNTER — Ambulatory Visit
Admission: RE | Admit: 2022-12-29 | Discharge: 2022-12-29 | Disposition: A | Discharge status: Home / Self Care | Payer: Medicare Other | Source: Ambulatory Visit | Attending: Internal Medicine | Admitting: Internal Medicine

## 2022-12-29 DIAGNOSIS — R928 Other abnormal and inconclusive findings on diagnostic imaging of breast: Secondary | ICD-10-CM

## 2022-12-29 DIAGNOSIS — N6315 Unspecified lump in the right breast, overlapping quadrants: Secondary | ICD-10-CM | POA: Diagnosis not present

## 2022-12-29 DIAGNOSIS — N6321 Unspecified lump in the left breast, upper outer quadrant: Secondary | ICD-10-CM | POA: Diagnosis not present

## 2022-12-29 DIAGNOSIS — N6310 Unspecified lump in the right breast, unspecified quadrant: Secondary | ICD-10-CM

## 2022-12-29 DIAGNOSIS — N6320 Unspecified lump in the left breast, unspecified quadrant: Secondary | ICD-10-CM

## 2022-12-29 DIAGNOSIS — N6312 Unspecified lump in the right breast, upper inner quadrant: Secondary | ICD-10-CM | POA: Diagnosis not present

## 2022-12-29 DIAGNOSIS — N6031 Fibrosclerosis of right breast: Secondary | ICD-10-CM | POA: Diagnosis not present

## 2022-12-29 DIAGNOSIS — N6032 Fibrosclerosis of left breast: Secondary | ICD-10-CM | POA: Diagnosis not present

## 2022-12-29 HISTORY — PX: BREAST BIOPSY: SHX20

## 2022-12-30 LAB — SURGICAL PATHOLOGY

## 2023-01-04 NOTE — Telephone Encounter (Signed)
Spoke with patient and she stated all her concerns have been taking care of by Tiffany last week.

## 2023-01-12 ENCOUNTER — Other Ambulatory Visit: Payer: Self-pay | Admitting: *Deleted

## 2023-01-12 DIAGNOSIS — I7121 Aneurysm of the ascending aorta, without rupture: Secondary | ICD-10-CM

## 2023-01-12 NOTE — Progress Notes (Signed)
Received message from Vascular Surgery stating referral was denied stating they do not treat ascending aortic aneurysms and that the referral needs to be sent to Cardiothoracic Surgery.  RN placed new referral for evaluation of aortic aneurysm.

## 2023-01-13 ENCOUNTER — Inpatient Hospital Stay: Payer: Medicare Other

## 2023-01-13 ENCOUNTER — Telehealth: Payer: Self-pay | Admitting: Hematology and Oncology

## 2023-01-16 ENCOUNTER — Inpatient Hospital Stay: Payer: Medicare Other

## 2023-01-16 DIAGNOSIS — D473 Essential (hemorrhagic) thrombocythemia: Secondary | ICD-10-CM

## 2023-01-16 DIAGNOSIS — R21 Rash and other nonspecific skin eruption: Secondary | ICD-10-CM | POA: Diagnosis not present

## 2023-01-16 DIAGNOSIS — D75839 Thrombocytosis, unspecified: Secondary | ICD-10-CM | POA: Diagnosis not present

## 2023-01-16 DIAGNOSIS — F32A Depression, unspecified: Secondary | ICD-10-CM | POA: Diagnosis not present

## 2023-01-16 LAB — CMP (CANCER CENTER ONLY)
ALT: 11 U/L (ref 0–44)
AST: 21 U/L (ref 15–41)
Albumin: 4.9 g/dL (ref 3.5–5.0)
Alkaline Phosphatase: 47 U/L (ref 38–126)
Anion gap: 8 (ref 5–15)
BUN: 9 mg/dL (ref 8–23)
CO2: 24 mmol/L (ref 22–32)
Calcium: 9.6 mg/dL (ref 8.9–10.3)
Chloride: 105 mmol/L (ref 98–111)
Creatinine: 0.64 mg/dL (ref 0.44–1.00)
GFR, Estimated: >60 mL/min (ref >60)
Glucose, Bld: 93 mg/dL (ref 70–99)
Potassium: 4.2 mmol/L (ref 3.5–5.1)
Sodium: 137 mmol/L (ref 135–145)
Total Bilirubin: 0.4 mg/dL (ref 0.3–1.2)
Total Protein: 7.2 g/dL (ref 6.5–8.1)

## 2023-01-16 LAB — CBC WITH DIFFERENTIAL (CANCER CENTER ONLY)
Abs Immature Granulocytes: 0.03 10*3/uL (ref 0.00–0.07)
Basophils Absolute: 0 10*3/uL (ref 0.0–0.1)
Basophils Relative: 0 %
Eosinophils Absolute: 0.2 10*3/uL (ref 0.0–0.5)
Eosinophils Relative: 3 %
HCT: 39.7 % (ref 36.0–46.0)
Hemoglobin: 13.1 g/dL (ref 12.0–15.0)
Immature Granulocytes: 0 %
Lymphocytes Relative: 28 %
Lymphs Abs: 2.1 10*3/uL (ref 0.7–4.0)
MCH: 30.8 pg (ref 26.0–34.0)
MCHC: 33 g/dL (ref 30.0–36.0)
MCV: 93.4 fL (ref 80.0–100.0)
Monocytes Absolute: 0.6 10*3/uL (ref 0.1–1.0)
Monocytes Relative: 7 %
Neutro Abs: 4.7 10*3/uL (ref 1.7–7.7)
Neutrophils Relative %: 62 %
Platelet Count: 594 10*3/uL — ABNORMAL HIGH (ref 150–400)
RBC: 4.25 MIL/uL (ref 3.87–5.11)
RDW: 14.3 % (ref 11.5–15.5)
WBC Count: 7.5 10*3/uL (ref 4.0–10.5)
nRBC: 0 % (ref 0.0–0.2)

## 2023-01-16 LAB — IRON AND IRON BINDING CAPACITY (CC-WL,HP ONLY)
Iron: 164 ug/dL (ref 28–170)
Saturation Ratios: 60 % — ABNORMAL HIGH (ref 10.4–31.8)
TIBC: 274 ug/dL (ref 250–450)
UIBC: 110 ug/dL — ABNORMAL LOW (ref 148–442)

## 2023-01-16 LAB — FERRITIN: Ferritin: 26 ng/mL (ref 11–307)

## 2023-01-17 ENCOUNTER — Other Ambulatory Visit: Payer: Self-pay | Admitting: *Deleted

## 2023-01-17 MED ORDER — RUXOLITINIB PHOSPHATE 5 MG PO TABS: 5.0000 mg | ORAL_TABLET | Freq: Two times a day (BID) | ORAL | 3 refills | Status: DC

## 2023-01-18 ENCOUNTER — Other Ambulatory Visit: Payer: Self-pay | Admitting: *Deleted

## 2023-01-18 MED ORDER — RUXOLITINIB PHOSPHATE 5 MG PO TABS: 5.0000 mg | ORAL_TABLET | Freq: Two times a day (BID) | ORAL | 3 refills | Status: DC

## 2023-01-20 ENCOUNTER — Inpatient Hospital Stay: Payer: Medicare Other | Attending: Hematology and Oncology | Admitting: Hematology and Oncology

## 2023-01-20 VITALS — BP 154/76 | HR 78 | Temp 97.8°F | Resp 18 | Ht 61.0 in | Wt 133.5 lb

## 2023-01-20 DIAGNOSIS — R21 Rash and other nonspecific skin eruption: Secondary | ICD-10-CM | POA: Diagnosis not present

## 2023-01-20 DIAGNOSIS — D75839 Thrombocytosis, unspecified: Secondary | ICD-10-CM | POA: Insufficient documentation

## 2023-01-20 DIAGNOSIS — D473 Essential (hemorrhagic) thrombocythemia: Secondary | ICD-10-CM | POA: Insufficient documentation

## 2023-01-20 NOTE — Progress Notes (Signed)
Patient Care Team: Lula Olszewski, MD as PCP - General (Internal Medicine) Serena Croissant, MD as Consulting Physician (Hematology and Oncology) Bensimhon, Bevelyn Buckles, MD as Consulting Physician (Cardiology)  DIAGNOSIS:  Encounter Diagnosis  Name Primary?   Essential thrombocytosis (HCC) Yes   CHIEF COMPLIANT: Follow-up essential thrombocytosis and hemochromatosis  Discussed the use of AI scribe software for clinical note transcription with the patient, who gave verbal consent to proceed.  History of Present Illness   Kara [name], a patient with a history of hemochromatosis and essential thrombocytosis, presents for a routine follow-up. The patient reports feeling better in the past few weeks, attributing this improvement to a new diet involving time-restricted eating and the Fast 800 plan. The patient notes a decrease in fatigue, although it is still present to a lesser degree.  In addition to the primary conditions, the patient has been experiencing a persistent rash. Despite its duration, the rash's etiology remains unclear, with previous considerations including shingles. The rash is described as being worse at night and has led to some skin deterioration. The patient plans to seek dermatological consultation for further evaluation.  The patient also mentions an upcoming eyelid surgery and queries about the need to stop aspirin intake prior to the procedure.         ALLERGIES:  is allergic to penicillins and tramadol.  MEDICATIONS:  Current Outpatient Medications  Medication Sig Dispense Refill   aspirin EC 81 MG tablet Take 1 tablet (81 mg total) by mouth daily.     lidocaine (XYLOCAINE) 5 % ointment Apply 1 Application topically as needed. 35.44 g 0   ruxolitinib phosphate (JAKAFI) 5 MG tablet Take 1 tablet (5 mg total) by mouth 2 (two) times daily. 60 tablet 3   No current facility-administered medications for this visit.    PHYSICAL EXAMINATION: ECOG PERFORMANCE STATUS: 1 -  Symptomatic but completely ambulatory  Vitals:   01/20/23 0824  BP: (!) 154/76  Pulse: 78  Resp: 18  Temp: 97.8 F (36.6 C)  SpO2: 97%   Filed Weights   01/20/23 0824  Weight: 133 lb 8 oz (60.6 kg)      LABORATORY DATA:  I have reviewed the data as listed    Latest Ref Rng & Units 01/16/2023   10:53 AM 12/16/2022    3:22 PM 11/18/2022    8:17 AM  CMP  Glucose 70 - 99 mg/dL 93  161  88   BUN 8 - 23 mg/dL 9  17  17    Creatinine 0.44 - 1.00 mg/dL 0.96  0.45  4.09   Sodium 135 - 145 mmol/L 137  138  137   Potassium 3.5 - 5.1 mmol/L 4.2  4.1  4.1   Chloride 98 - 111 mmol/L 105  108  105   CO2 22 - 32 mmol/L 24  22  23    Calcium 8.9 - 10.3 mg/dL 9.6  9.3  9.6   Total Protein 6.5 - 8.1 g/dL 7.2  6.9  7.2   Total Bilirubin 0.3 - 1.2 mg/dL 0.4  0.3  0.5   Alkaline Phos 38 - 126 U/L 47  64  56   AST 15 - 41 U/L 21  16  19    ALT 0 - 44 U/L 11  9  9      Lab Results  Component Value Date   WBC 7.5 01/16/2023   HGB 13.1 01/16/2023   HCT 39.7 01/16/2023   MCV 93.4 01/16/2023   PLT 594 (H) 01/16/2023  NEUTROABS 4.7 01/16/2023    ASSESSMENT & PLAN:  Essential thrombocytosis (HCC) Lab: 06/18/21:  Hb 11.2, Platelet 778 07/30/2021: Hemoglobin 12.9, platelets 783, iron saturation 13%, ferritin 7  10/01/2021: Hemoglobin 14.1, platelets 828, ferritin 22 11/02/2021: Hemoglobin 12.9, platelets 435, ferritin 22, iron saturation 54%  04/06/2022: Platelets 763 05/20/2022: Platelets 782, hemoglobin 13.2, iron saturation 64%, ferritin 42, folate 13.3, B12 222 06/09/22: platelets 826 (shingles) 06/21/2022: Platelets 701, iron saturation 66%, ferritin 31 07/07/2022: Platelets 664, ferritin 34, iron saturation 56%, hemoglobin 14.6 08/15/2022: Platelets 857 08/26/2022: Platelets 938, SPEP, KL ratio: Normal, rheumatoid factor: 10: Normal, ANA, SSA SSB: Negative 09/14/2022: Ferritin 30, iron saturation 56%, hemoglobin 13.8, platelets 951 12/30/2022: 13.1, platelets 594, iron saturation 60%, ferritin 26    Severe palpitations: Related to anagrelide. D/C anagrelide and her palpitations have mostly resolved.   Current treatment: Since the patient is intolerant to anagrelide and hydroxyurea, currently on Jakafi at the low-dose of 5 mg twice a day.  Started 09/18/2022   Jakafi toxicities: Denies any major adverse effects to Jakafi Occasional nightmares and difficulty with sleeping   Hemochromatosis: On periodic phlebotomies.  Phlebotomy was canceled on 12/16/2022.  With the ferritin of only 12 we can cancel the phlebotomy that is coming up later this month.   Return to clinic in 6 weeks with labs and follow-up    Orders Placed This Encounter  Procedures   CBC with Differential ([facility] Only)    Standing Status:   Future    Standing Expiration Date:   01/20/2024   Ferritin    Standing Status:   Future    Standing Expiration Date:   01/20/2024   Iron and Iron Binding Capacity ([facility] only)    Standing Status:   Future    Standing Expiration Date:   01/20/2024   CMP ([facility] only)    Standing Status:   Future    Standing Expiration Date:   01/20/2024   The patient has a good understanding of the overall plan. she agrees with it. she will call with any problems that may develop before the next visit here. Total time spent: 30 mins including face to face time and time spent for planning, charting and co-ordination of care   Kara Meek, MD 01/20/23

## 2023-01-20 NOTE — Assessment & Plan Note (Signed)
Lab: 06/18/21:  Hb 11.2, Platelet 778 07/30/2021: Hemoglobin 12.9, platelets 783, iron saturation 13%, ferritin 7  10/01/2021: Hemoglobin 14.1, platelets 828, ferritin 22 11/02/2021: Hemoglobin 12.9, platelets 435, ferritin 22, iron saturation 54%  04/06/2022: Platelets 763 05/20/2022: Platelets 782, hemoglobin 13.2, iron saturation 64%, ferritin 42, folate 13.3, B12 222 06/09/22: platelets 826 (shingles) 06/21/2022: Platelets 701, iron saturation 66%, ferritin 31 07/07/2022: Platelets 664, ferritin 34, iron saturation 56%, hemoglobin 14.6 08/15/2022: Platelets 857 08/26/2022: Platelets 938, SPEP, KL ratio: Normal, rheumatoid factor: 10: Normal, ANA, SSA SSB: Negative 09/14/2022: Ferritin 30, iron saturation 56%, hemoglobin 13.8, platelets 951 12/30/2022: 13.1, platelets 594, iron saturation 60%, ferritin 26   Severe palpitations: Related to anagrelide. D/C anagrelide and her palpitations have mostly resolved.   Current treatment: Since the patient is intolerant to anagrelide and hydroxyurea, currently on Jakafi at the low-dose of 5 mg twice a day.  Started 09/18/2022   Jakafi toxicities: Denies any major adverse effects to Jakafi Occasional nightmares and difficulty with sleeping   Hemochromatosis: On periodic phlebotomies.  Phlebotomy was canceled on 12/16/2022.  With the ferritin of only 12 we can cancel the phlebotomy that is coming up later this month.   Return to clinic in 2 months with labs and follow-up

## 2023-01-27 DIAGNOSIS — R238 Other skin changes: Secondary | ICD-10-CM | POA: Diagnosis not present

## 2023-02-01 ENCOUNTER — Institutional Professional Consult (permissible substitution): Payer: Medicare Other

## 2023-02-01 VITALS — BP 160/93 | HR 81 | Resp 18 | Ht 61.0 in | Wt 134.0 lb

## 2023-02-01 DIAGNOSIS — I7121 Aneurysm of the ascending aorta, without rupture: Secondary | ICD-10-CM

## 2023-02-01 NOTE — Progress Notes (Signed)
PCP is Kara Olszewski, MD Referring Provider is Kara Croissant, MD  Chief Complaint  Patient presents with   Thoracic Aortic Aneurysm    Surgical consult/ Chest CT 11/09/22    HPI: Kara [name] is a 72 year old [name] with past history notable for post herpetic neuralgia, bilateral breast masses (with biopsies last month negative for malignancy), emphysema, pulmonary nodule being followed by pulmonary medicine, thrombocytopenia being followed by hematology/oncology,.  Positive Cologuard test with pending colonoscopy.  She is a former 60-pack-year smoker having quit on 04/18/2020.  She recently underwent a chest CT with a cancer screening protocol in July of this year that identified the 8 mm left upper lobe pulmonary nodule.  In addition, she was discovered to have a 4.3 cm  thoracic aortic aneurysm.  She is referred to [place] for evaluation and surveillance of the aneurysm.  Past Medical History:  Diagnosis Date   Allergy Unknown   Penecillin   Anemia Unknown   Review lab tests from [facility]   Cataract One month   Very early stages   Clotting disorder (HCC) Unknown   Hemochromatosis & Jax2 Mutation   Hemochromatosis    Hypertension Unknown   Sometimes   Orthostatic hypotension 05/20/2016   Sleep apnea Unknown   Probably due to collapsed septum    Past Surgical History:  Procedure Laterality Date   BREAST BIOPSY Left 12/29/2022   [place] LT BREAST BX W LOC DEV 1ST LESION IMG BX SPEC US GUIDE 12/29/2022 [facility]   BREAST BIOPSY Right 12/29/2022   [place] RT BREAST BX W LOC DEV EA ADD LESION IMG BX SPEC US GUIDE 12/29/2022 [facility]   BREAST BIOPSY Right 12/29/2022   [place] RT BREAST BX W LOC DEV 1ST LESION IMG BX SPEC US GUIDE 12/29/2022 [facility]   COSMETIC SURGERY  Mar 07 2022   Facial   NASAL SEPTUM SURGERY      Family History  Problem Relation Age of Onset   Healthy Mother    Acute myelogenous leukemia Father    Bipolar disorder Sister    Diabetes Neg Hx    Cancer Neg  Hx    Heart failure Neg Hx    Hyperlipidemia Neg Hx    Hypertension Neg Hx     Social History Social History   Tobacco Use   Smoking status: Former    Current packs/day: 0.00    Average packs/day: 2.0 packs/day for 30.0 years (60.0 ttl pk-yrs)    Types: Cigarettes    Start date: 04/18/1990    Quit date: 04/18/2020    Years since quitting: 2.7   Smokeless tobacco: Never   Tobacco comments:    Quit  Substance Use Topics   Alcohol use: Not Currently    Comment: No alcohol approx 13 yrs   Drug use: No    Current Outpatient Medications  Medication Sig Dispense Refill   aspirin EC 81 MG tablet Take 1 tablet (81 mg total) by mouth daily.     lidocaine (XYLOCAINE) 5 % ointment Apply 1 Application topically as needed. 35.Kara g 0   ruxolitinib phosphate (JAKAFI) 5 MG tablet Take 1 tablet (5 mg total) by mouth 2 (two) times daily. 60 tablet 3   No current facility-administered medications for this visit.    Allergies  Allergen Reactions   Penicillins Rash   Tramadol Nausea And Vomiting    Review of Systems: Review of Systems  Constitutional: Negative.   HENT:         Recent  blepharoplasty  Eyes: Negative.   Respiratory: Negative.    Cardiovascular: Negative.   Gastrointestinal: Negative.   Genitourinary: Negative.   Musculoskeletal: Negative.   Skin:  Positive for rash.       Recent rash to her right chest that one of her physicians thought was shingles and another physician told her it was not.  The rash has cleared, she continues to have pain in her right anterior chest radiating around to the right axilla  Neurological: Negative.   Endo/Heme/Allergies:        History of hemochromatosis and thrombocytosis Seasonal allergies  Psychiatric/Behavioral: Negative.       There were no vitals taken for this visit. Physical Exam: Vital signs BP 160/93 Pulse 81 Respirations 18 SpO2 96%  General: Pleasant 72 year old [name] in no distress. HEENT: Head is bruising around  both eyes recent blepharoplasty Neck: No JVD, no carotid bruits, no palpable lymph nodes Chest: Symmetrical, breath sounds are clear to auscultation Heart: Regular rate and rhythm, no murmur Extremities: Well-perfused, no peripheral edema, no deformity Neuro: Grossly nonfocal  Diagnostic Tests: CLINICAL DATA:  72 year old [name] with 63 pack-year history of smoking. Lung cancer screening.   EXAM: CT CHEST WITHOUT CONTRAST LOW-DOSE FOR LUNG CANCER SCREENING   TECHNIQUE: Multidetector CT imaging of the chest was performed following the standard protocol without IV contrast.   RADIATION DOSE REDUCTION: This exam was performed according to the departmental dose-optimization program which includes automated exposure control, adjustment of the mA and/or kV according to patient size and/or use of iterative reconstruction technique.   COMPARISON:  No comparison studies available.   FINDINGS: Cardiovascular: The heart size is normal. No substantial pericardial effusion. Ascending thoracic aorta measures 4.3 cm diameter. Mild atherosclerotic calcification is noted in the wall of the thoracic aorta.   Mediastinum/Nodes: No mediastinal lymphadenopathy. No evidence for gross hilar lymphadenopathy although assessment is limited by the lack of intravenous contrast on the current study. The esophagus has normal imaging features. There is no axillary lymphadenopathy.   Lungs/Pleura: Centrilobular emphsyema noted. Atelectasis or scarring noted anterior right lung with probable subsegmental atelectasis in the dependent lung bases bilaterally. Atelectasis or scarring is seen in the inferior lingula. Scattered tiny pulmonary nodules are identified in both lungs. The dominant nodule on this study is in the left upper lobe measuring 8 mm on image 120. No focal airspace consolidation. No pleural effusion.   Upper Abdomen: Visualized portion of the upper abdomen is unremarkable.    Musculoskeletal: No worrisome lytic or sclerotic osseous abnormality.   IMPRESSION: 1. Lung-RADS 4A, suspicious. 8 mm left upper lobe pulmonary nodule. Follow up low-dose chest CT without contrast in 3 months (please use the following order, "CT CHEST LCS NODULE FOLLOW-UP W/O CM") is recommended. Alternatively, PET may be considered when there is a solid component 8mm or larger. 2. 4.3 cm diameter ascending thoracic aortic aneurysm. Recommend annual imaging followup by CTA or MRA. This recommendation follows 2010 ACCF/AHA/AATS/ACR/ASA/SCA/SCAI/SIR/STS/SVM Guidelines for the Diagnosis and Management of Patients with Thoracic Aortic Disease. Circulation. 2010; 121: Z610-R604. Aortic aneurysm NOS (ICD10-I71.9) 3.  Emphysema (ICD10-J43.9) and Aortic Atherosclerosis (ICD10-170.0)     Electronically Signed   By: Kennith Center M.D.   On: 11/15/2022 16:43  Impression / Plan: Asymptomatic 4.3 cm thoracic aortic aneurysm.  No surgical intervention is indicated but ongoing surveillance is recommended.  Plan for CTA chest in 1 year.  We discussed the importance of blood pressure management and monitoring with a goal of less than 130/90.  Ms.  Bochicchio is advised to avoid repetitive strenuous activities such as lifting more than 30 pounds we also discussed the recommendation to avoid the fluoroquinolone class of antibiotics as these are known to weaken connective tissue.  Follow-up 1 year with CTA  Leary Roca, PA-C [facility] [phone]

## 2023-02-01 NOTE — Patient Instructions (Signed)
Monitor and maintain healthy blood pressure with a goal of less than 130/90  Avoid strenuous activity with lifting, pushing, or pulling more than 30 pounds  Avoid the fluoroquinolone class of antibiotics such as Levaquin Cipro, or Avelox.  These agents are known to weaken connective tissue

## 2023-02-10 ENCOUNTER — Ambulatory Visit
Admission: RE | Admit: 2023-02-10 | Discharge: 2023-02-10 | Disposition: A | Discharge status: Home / Self Care | Payer: Medicare Other | Source: Ambulatory Visit | Attending: Internal Medicine

## 2023-02-10 DIAGNOSIS — Z87891 Personal history of nicotine dependence: Secondary | ICD-10-CM | POA: Diagnosis not present

## 2023-02-10 DIAGNOSIS — I700 Atherosclerosis of aorta: Secondary | ICD-10-CM | POA: Diagnosis not present

## 2023-02-10 DIAGNOSIS — I7121 Aneurysm of the ascending aorta, without rupture: Secondary | ICD-10-CM | POA: Diagnosis not present

## 2023-02-10 DIAGNOSIS — R911 Solitary pulmonary nodule: Secondary | ICD-10-CM

## 2023-02-15 ENCOUNTER — Telehealth: Payer: Self-pay | Admitting: *Deleted

## 2023-02-15 DIAGNOSIS — D473 Essential (hemorrhagic) thrombocythemia: Secondary | ICD-10-CM

## 2023-02-15 NOTE — Telephone Encounter (Signed)
Received call from with complaint of headache and pressure.  Pt requesting labs and MD f/u to discuss Jakafi dose and recheck plt count.  Appt scheduled, pt educated and verbalized understanding.

## 2023-02-16 ENCOUNTER — Inpatient Hospital Stay: Payer: Medicare Other | Admitting: Hematology and Oncology

## 2023-02-16 ENCOUNTER — Inpatient Hospital Stay: Payer: Medicare Other

## 2023-02-16 VITALS — BP 142/79 | HR 78 | Temp 97.3°F | Resp 18 | Ht 61.0 in | Wt 134.8 lb

## 2023-02-16 DIAGNOSIS — D75839 Thrombocytosis, unspecified: Secondary | ICD-10-CM | POA: Diagnosis not present

## 2023-02-16 DIAGNOSIS — R21 Rash and other nonspecific skin eruption: Secondary | ICD-10-CM | POA: Diagnosis not present

## 2023-02-16 DIAGNOSIS — D473 Essential (hemorrhagic) thrombocythemia: Secondary | ICD-10-CM

## 2023-02-16 LAB — CBC WITH DIFFERENTIAL (CANCER CENTER ONLY)
Abs Immature Granulocytes: 0.04 10*3/uL (ref 0.00–0.07)
Basophils Absolute: 0.1 10*3/uL (ref 0.0–0.1)
Basophils Relative: 1 %
Eosinophils Absolute: 0.3 10*3/uL (ref 0.0–0.5)
Eosinophils Relative: 3 %
HCT: 40.5 % (ref 36.0–46.0)
Hemoglobin: 13.3 g/dL (ref 12.0–15.0)
Immature Granulocytes: 0 %
Lymphocytes Relative: 30 %
Lymphs Abs: 3.1 10*3/uL (ref 0.7–4.0)
MCH: 30.9 pg (ref 26.0–34.0)
MCHC: 32.8 g/dL (ref 30.0–36.0)
MCV: 94 fL (ref 80.0–100.0)
Monocytes Absolute: 0.7 10*3/uL (ref 0.1–1.0)
Monocytes Relative: 7 %
Neutro Abs: 6 10*3/uL (ref 1.7–7.7)
Neutrophils Relative %: 59 %
Platelet Count: 793 10*3/uL — ABNORMAL HIGH (ref 150–400)
RBC: 4.31 MIL/uL (ref 3.87–5.11)
RDW: 14.3 % (ref 11.5–15.5)
WBC Count: 10.1 10*3/uL (ref 4.0–10.5)
nRBC: 0 % (ref 0.0–0.2)

## 2023-02-16 LAB — CMP (CANCER CENTER ONLY)
ALT: 12 U/L (ref 0–44)
AST: 20 U/L (ref 15–41)
Albumin: 5.1 g/dL — ABNORMAL HIGH (ref 3.5–5.0)
Alkaline Phosphatase: 64 U/L (ref 38–126)
Anion gap: 8 (ref 5–15)
BUN: 24 mg/dL — ABNORMAL HIGH (ref 8–23)
CO2: 26 mmol/L (ref 22–32)
Calcium: 9.9 mg/dL (ref 8.9–10.3)
Chloride: 103 mmol/L (ref 98–111)
Creatinine: 0.66 mg/dL (ref 0.44–1.00)
GFR, Estimated: >60 mL/min (ref >60)
Glucose, Bld: 92 mg/dL (ref 70–99)
Potassium: 4.1 mmol/L (ref 3.5–5.1)
Sodium: 137 mmol/L (ref 135–145)
Total Bilirubin: 0.5 mg/dL (ref 0.3–1.2)
Total Protein: 7.8 g/dL (ref 6.5–8.1)

## 2023-02-16 LAB — IRON AND IRON BINDING CAPACITY (CC-WL,HP ONLY)
Iron: 221 ug/dL — ABNORMAL HIGH (ref 28–170)
Saturation Ratios: 72 % — ABNORMAL HIGH (ref 10.4–31.8)
TIBC: 308 ug/dL (ref 250–450)
UIBC: 87 ug/dL — ABNORMAL LOW (ref 148–442)

## 2023-02-16 LAB — FERRITIN: Ferritin: 56 ng/mL (ref 11–307)

## 2023-02-16 NOTE — Assessment & Plan Note (Signed)
Lab: 06/18/21:  Hb 11.2, Platelet 778 07/30/2021: Hemoglobin 12.9, platelets 783, iron saturation 13%, ferritin 7  10/01/2021: Hemoglobin 14.1, platelets 828, ferritin 22 11/02/2021: Hemoglobin 12.9, platelets 435, ferritin 22, iron saturation 54%  04/06/2022: Platelets 763 05/20/2022: Platelets 782, hemoglobin 13.2, iron saturation 64%, ferritin 42, folate 13.3, B12 222 06/09/22: platelets 826 (shingles) 06/21/2022: Platelets 701, iron saturation 66%, ferritin 31 08/26/2022: Platelets 938, SPEP, KL ratio: Normal, rheumatoid factor: 10: Normal, ANA, SSA SSB: Negative 09/14/2022: Ferritin 30, iron saturation 56%, hemoglobin 13.8, platelets 951 12/30/2022: Hemoglobin 13.1, platelets 594, iron saturation 60%, ferritin 26 02/16/2023:   Severe palpitations: Related to anagrelide. D/C anagrelide and her palpitations have mostly resolved.   Current treatment: Since the patient is intolerant to anagrelide and hydroxyurea, currently on Jakafi at the low-dose of 5 mg twice a day.  Started 09/18/2022   Jakafi toxicities: Denies any major adverse effects to Jakafi Occasional nightmares and difficulty with sleeping   Hemochromatosis: On periodic phlebotomies. With the ferritin of only 12 we can cancel the phlebotomy for the time being  Return to clinic in 6 weeks with labs and follow-up

## 2023-02-16 NOTE — Progress Notes (Signed)
Patient Care Team: Lula Olszewski, MD as PCP - General (Internal Medicine) Serena Croissant, MD as Consulting Physician (Hematology and Oncology) Bensimhon, Bevelyn Buckles, MD as Consulting Physician (Cardiology)  DIAGNOSIS:  Encounter Diagnosis  Name Primary?   Essential thrombocytosis (HCC) Yes      CHIEF COMPLIANT: Follow-up of essential thrombocytosis    History of Present Illness   The patient, with a history of high platelet count managed with Jakafi 5mg  twice daily, presents with daily episodes of feeling unwell, described as not quite lightheadedness but a controlled 'rush.' These episodes occur roughly at the same time each day, leading the patient to suspect it may be related to the timing of her medication. She denies missing any doses recently, except for possibly one. She also reports stopping aspirin for four to five days. She denies any other changes to her medication regimen or diet that could explain the recent increase in her platelet count from 594 to 793.  The patient also reports concerns about weight and blood pressure, wondering if these could be contributing to her symptoms or platelet count. She expresses a desire to lose weight and exercise more. She also questions if Jakafi could be causing hypertension, as she's heard of others passing out on the medication. She does not currently monitor her blood pressure at home but expresses willingness to start doing so, especially during her episodes of feeling unwell.         ALLERGIES:  is allergic to penicillins and tramadol.  MEDICATIONS:  Current Outpatient Medications  Medication Sig Dispense Refill   aspirin EC 81 MG tablet Take 1 tablet (81 mg total) by mouth daily.     lidocaine (XYLOCAINE) 5 % ointment Apply 1 Application topically as needed. 35.44 g 0   ruxolitinib phosphate (JAKAFI) 5 MG tablet Take 1 tablet (5 mg total) by mouth 2 (two) times daily. 60 tablet 3   No current facility-administered medications  for this visit.    PHYSICAL EXAMINATION: ECOG PERFORMANCE STATUS: 1 - Symptomatic but completely ambulatory  Vitals:   02/16/23 0849  BP: (!) 142/79  Pulse: 78  Resp: 18  Temp: (!) 97.3 F (36.3 C)  SpO2: 100%   Filed Weights   02/16/23 0849  Weight: 134 lb 12.8 oz (61.1 kg)       LABORATORY DATA:  I have reviewed the data as listed    Latest Ref Rng & Units 01/16/2023   10:53 AM 12/16/2022    3:22 PM 11/18/2022    8:17 AM  CMP  Glucose 70 - 99 mg/dL 93  161  88   BUN 8 - 23 mg/dL 9  17  17    Creatinine 0.44 - 1.00 mg/dL 0.96  0.45  4.09   Sodium 135 - 145 mmol/L 137  138  137   Potassium 3.5 - 5.1 mmol/L 4.2  4.1  4.1   Chloride 98 - 111 mmol/L 105  108  105   CO2 22 - 32 mmol/L 24  22  23    Calcium 8.9 - 10.3 mg/dL 9.6  9.3  9.6   Total Protein 6.5 - 8.1 g/dL 7.2  6.9  7.2   Total Bilirubin 0.3 - 1.2 mg/dL 0.4  0.3  0.5   Alkaline Phos 38 - 126 U/L 47  64  56   AST 15 - 41 U/L 21  16  19    ALT 0 - 44 U/L 11  9  9      Lab Results  Component Value Date   WBC 10.1 02/16/2023   HGB 13.3 02/16/2023   HCT 40.5 02/16/2023   MCV 94.0 02/16/2023   PLT 793 (H) 02/16/2023   NEUTROABS 6.0 02/16/2023    ASSESSMENT & PLAN:  Essential thrombocytosis (HCC) Lab: 06/18/21:  Hb 11.2, Platelet 778 07/30/2021: Hemoglobin 12.9, platelets 783, iron saturation 13%, ferritin 7  10/01/2021: Hemoglobin 14.1, platelets 828, ferritin 22 11/02/2021: Hemoglobin 12.9, platelets 435, ferritin 22, iron saturation 54%  04/06/2022: Platelets 763 05/20/2022: Platelets 782, hemoglobin 13.2, iron saturation 64%, ferritin 42, folate 13.3, B12 222 06/09/22: platelets 826 (shingles) 06/21/2022: Platelets 701, iron saturation 66%, ferritin 31 08/26/2022: Platelets 938, SPEP, KL ratio: Normal, rheumatoid factor: 10: Normal, ANA, SSA SSB: Negative 09/14/2022: Ferritin 30, iron saturation 56%, hemoglobin 13.8, platelets 951 12/30/2022: Hemoglobin 13.1, platelets 594, iron saturation 60%, ferritin  26 02/16/2023: Platelets 793    Current treatment: Since the patient is intolerant to anagrelide and hydroxyurea, currently on Jakafi at the low-dose of 5 mg twice a day.  Started 09/18/2022   Jakafi toxicities: Denies any major adverse effects to Jakafi Occasional nightmares and difficulty with sleeping   Hemochromatosis: On periodic phlebotomies. With the ferritin of only 12 we can cancel the phlebotomy for the time being  Return to clinic in 4 weeks with labs and follow-up     Orders Placed This Encounter  Procedures   CBC with Differential ([facility] Only)    Standing Status:   Future    Standing Expiration Date:   02/16/2024   Ferritin    Standing Status:   Future    Standing Expiration Date:   02/16/2024   Iron and Iron Binding Capacity ([facility] only)    Standing Status:   Future    Standing Expiration Date:   02/16/2024   CMP ([facility] Center only)    Standing Status:   Future    Standing Expiration Date:   02/16/2024   The patient has a good understanding of the overall plan. she agrees with it. she will call with any problems that may develop before the next visit here. Total time spent: 30 mins including face to face time and time spent for planning, charting and co-ordination of care   Tamsen Meek, MD 02/16/23

## 2023-02-27 ENCOUNTER — Telehealth: Payer: Self-pay | Admitting: Internal Medicine

## 2023-02-27 NOTE — Telephone Encounter (Signed)
Spoke to [employer]. She reports she has not received results from her LUNG CHEST LCS NODULE F/U LOW DOSE WO CONTRAST completed on 02/10/23. Please call pt at [phone].

## 2023-02-27 NOTE — Telephone Encounter (Signed)
See message below °

## 2023-03-02 ENCOUNTER — Encounter: Payer: Self-pay | Admitting: Internal Medicine

## 2023-03-02 NOTE — Progress Notes (Signed)
Reviewed CT chest (03/02/2023): Left upper lobe nodule stable at 6.8 mm, benign appearance (Lung-RADS 2). Ascending aortic aneurysm stable at 4.2 cm. Annual CT screening recommended.   Overall this means nodules are less worrisome because they  aren't growing.  Same with the aneurysm. Suspicious for injuries from smoking that has now stopped... expect stability if no restart.  Still we should discuss cholesterol medications.  See Patient Message for details.

## 2023-03-02 NOTE — Telephone Encounter (Signed)
Called the number for GI to have the resulting of her imaging escalated. Sent my chart message informing patient.

## 2023-03-03 ENCOUNTER — Telehealth: Payer: Self-pay | Admitting: Pharmacy Technician

## 2023-03-03 NOTE — Telephone Encounter (Signed)
Oral Oncology Patient Advocate Encounter   Received notification that patient is due for re-enrollment for assistance for Jakafi through [facility]   Re-enrollment process has been initiated via online portal and is now pending POI.  Patient has agreed to send POI to my email for submission once she has the documentation.  [employer] phone number [phone].   I will continue to follow until final determination.  Jinger Neighbors, CPhT-Adv Oncology Pharmacy Patient Advocate [facility] Direct Number: [phone]  Fax: [phone]

## 2023-03-06 NOTE — Telephone Encounter (Signed)
Informed patient of lab results/notes on 11/15.

## 2023-03-17 ENCOUNTER — Inpatient Hospital Stay: Payer: Medicare Other | Attending: Hematology and Oncology

## 2023-03-20 ENCOUNTER — Inpatient Hospital Stay: Payer: Medicare Other | Admitting: Hematology and Oncology

## 2023-03-20 NOTE — Assessment & Plan Note (Signed)
Lab: 06/18/21:  Hb 11.2, Platelet 778 07/30/2021: Hemoglobin 12.9, platelets 783, iron saturation 13%, ferritin 7  10/01/2021: Hemoglobin 14.1, platelets 828, ferritin 22 11/02/2021: Hemoglobin 12.9, platelets 435, ferritin 22, iron saturation 54%  04/06/2022: Platelets 763 05/20/2022: Platelets 782, hemoglobin 13.2, iron saturation 64%, ferritin 42, folate 13.3, B12 222 06/09/22: platelets 826 (shingles) 06/21/2022: Platelets 701, iron saturation 66%, ferritin 31 08/26/2022: Platelets 938, SPEP, KL ratio: Normal, rheumatoid factor: 10: Normal, ANA, SSA SSB: Negative 09/14/2022: Ferritin 30, iron saturation 56%, hemoglobin 13.8, platelets 951 12/30/2022: Hemoglobin 13.1, platelets 594, iron saturation 60%, ferritin 26 02/16/2023: Platelets 793 03/20/2023: Platelets    Current treatment: Since the patient is intolerant to anagrelide and hydroxyurea, currently on Jakafi at the low-dose of 5 mg twice a day.  Started 09/18/2022   Jakafi toxicities: Denies any major adverse effects to Jakafi Occasional nightmares and difficulty with sleeping   Hemochromatosis: On periodic phlebotomies.   Return to clinic in 4 weeks with labs and follow-up

## 2023-03-21 ENCOUNTER — Telehealth: Payer: Self-pay | Admitting: Hematology and Oncology

## 2023-03-23 ENCOUNTER — Ambulatory Visit: Payer: Medicare Other | Admitting: Hematology and Oncology

## 2023-03-24 ENCOUNTER — Ambulatory Visit: Payer: Medicare Other | Admitting: Internal Medicine

## 2023-03-29 ENCOUNTER — Encounter: Payer: Self-pay | Admitting: Gastroenterology

## 2023-03-29 ENCOUNTER — Ambulatory Visit (INDEPENDENT_AMBULATORY_CARE_PROVIDER_SITE_OTHER): Payer: Medicare Other | Admitting: Gastroenterology

## 2023-03-29 ENCOUNTER — Encounter: Payer: Self-pay | Admitting: Hematology and Oncology

## 2023-03-29 ENCOUNTER — Other Ambulatory Visit (HOSPITAL_BASED_OUTPATIENT_CLINIC_OR_DEPARTMENT_OTHER): Payer: Self-pay

## 2023-03-29 VITALS — BP 150/86 | HR 76 | Ht 60.0 in | Wt 137.0 lb

## 2023-03-29 DIAGNOSIS — R195 Other fecal abnormalities: Secondary | ICD-10-CM | POA: Diagnosis not present

## 2023-03-29 MED ORDER — NA SULFATE-K SULFATE-MG SULF 17.5-3.13-1.6 GM/177ML PO SOLN
1.0000 | Freq: Once | ORAL | 0 refills | Status: DC
Start: 2023-03-29 — End: 2023-03-30
  Filled 2023-03-29: qty 354, 1d supply, fill #0

## 2023-03-29 NOTE — Progress Notes (Signed)
HPI :  72 year old female with a history of hereditary hemochromatosis, thrombocytosis/Jak mutation followed by hematology, positive Cologuard, referred here by Dr. Jon Billings for the positive Cologuard test.  I have met her in the past, I have cared for her mother previously.  She had a positive Cologuard this past July.  She has never had a prior colonoscopy.  She reports at baseline she normally has regular bowel habits.  She has been on a gluten-free diet for most of her life as she just feels better on that type of diet.  She states she stopped the gluten-free diet a few years ago and gained about 30 pounds or so.  She tolerates gluten and it does not cause any symptoms but she wants to go back on gluten-free diet to lose some of the weight she has gained.  Since she has been off the gluten-free diet her bowels somewhat fluctuate in regards to stool form which remains rather regular without any significant symptoms that bother her routinely.  She has occasional leakage of stool at times.  She denies any blood in her stools. She has never had any problems with anesthesia reportedly.  She states she was diagnosed with hemochromatosis in her mid 65s.  She reports a remote liver biopsy which showed no cirrhosis or fibrotic change.  She has been followed by Dr. Ave Filter for this issue and the Jak mutation with thrombocytosis.  She is currently on Jakafi.  She has had phlebotomy periodically for hemochromatosis in the past and is followed closely with hematology.  Her liver enzymes have been normal.  She had an ultrasound of her abdomen in 2022 which showed no evidence of pathology in her liver, no cirrhosis.  She was seen by cardiology in 2023, had a cardiac MRI last year which showed no evidence of hemochromatosis involvement of her heart.  She has been doing well in this regard.  We discussed her Jak mutation, she had questions about that and told me the various regimen she has been on in the past to treat  that.  She has had intolerance to a few medications in the past for this but is currently tolerating the [facility] but she wished she would be able to not take it.   CT chest 03/02/23: IMPRESSION: 1. Lung-RADS 2, benign appearance or behavior. Continue annual screening with low-dose chest CT without contrast in 12 months. 2. Similar ascending aortic aneurysm at 4.2 cm. This can be re-evaluated on routine lung cancer screening CT. 3. Aortic atherosclerosis (ICD10-I70.0) and emphysema (ICD10-J43.9).    Cardiac MRI 02/21/22: IMPRESSION: 1.  Normal LV size and systolic function, EF 61%.   2.  Normal RV size and systolic function, EF 53%.   3. No myocardial LGE, so no definitive evidence for prior MI, infiltrative disease, or myocarditis.   4.  Normal T1 and extracellular volume percentage.   5. Normal T2 star (>20 in all regions sampled). This is not suggestive of cardiac involvement by hemochromatosis.   Cologuard 10/26/22 - positive    US abdomen 09/09/20: FINDINGS: Gallbladder: No gallstones or wall thickening visualized. No sonographic Murphy sign noted by sonographer. Common bile duct: Diameter: 4.1 mm Liver: No focal lesion identified. Within normal limits in parenchymal echogenicity. Portal vein is patent on color Doppler imaging with normal direction of blood flow towards the liver.     Past Medical History:  Diagnosis Date   Allergy Unknown   Penecillin   Anemia Unknown   Review lab tests from [facility]  Aneurysm of ascending aorta without rupture (HCC)    Aortic atherosclerosis (HCC)    Cataract One month   Very early stages   Clotting disorder (HCC) Unknown   Hemochromatosis & Jax2 Mutation   Essential thrombocytosis (HCC)    Hemochromatosis    Jax's mutation   History of smoking 25-50 pack years    Hypertension Unknown   Sometimes   Lung nodule    Orthostatic hypotension 05/20/2016   Other emphysema (HCC)    Sleep apnea Unknown   Probably due to collapsed  septum     Past Surgical History:  Procedure Laterality Date   BREAST BIOPSY Left 12/29/2022   [place] LT BREAST BX W LOC DEV 1ST LESION IMG BX SPEC US GUIDE 12/29/2022 [facility]   BREAST BIOPSY Right 12/29/2022   [place] RT BREAST BX W LOC DEV EA ADD LESION IMG BX SPEC US GUIDE 12/29/2022 [facility]   BREAST BIOPSY Right 12/29/2022   [place] RT BREAST BX W LOC DEV 1ST LESION IMG BX SPEC US GUIDE 12/29/2022 [facility]   COSMETIC SURGERY  Mar 07 2022   Facial   NASAL SEPTUM SURGERY     Family History  Problem Relation Age of Onset   Arthritis Mother    Acute myelogenous leukemia Father    Bipolar disorder Sister    Graves' disease Sister    Heart disease Sister    Stroke Maternal Grandmother    Heart attack Maternal Grandfather    Heart disease Paternal Grandmother    Heart disease Paternal Grandfather    Diabetes Neg Hx    Cancer Neg Hx    Heart failure Neg Hx    Hyperlipidemia Neg Hx    Hypertension Neg Hx    Social History   Tobacco Use   Smoking status: Former    Current packs/day: 0.00    Average packs/day: 2.0 packs/day for 30.0 years (60.0 ttl pk-yrs)    Types: Cigarettes    Start date: 04/18/1990    Quit date: 04/18/2020    Years since quitting: 2.9   Smokeless tobacco: Never   Tobacco comments:    Quit  Vaping Use   Vaping status: Never Used  Substance Use Topics   Alcohol use: Not Currently    Comment: No alcohol approx 13 yrs   Drug use: No   Current Outpatient Medications  Medication Sig Dispense Refill   aspirin EC 81 MG tablet Take 1 tablet (81 mg total) by mouth daily.     ruxolitinib phosphate (JAKAFI) 5 MG tablet Take 1 tablet (5 mg total) by mouth 2 (two) times daily. 60 tablet 3   lidocaine (XYLOCAINE) 5 % ointment Apply 1 Application topically as needed. (Patient not taking: Reported on 03/29/2023) 35.44 g 0   No current facility-administered medications for this visit.   Allergies  Allergen Reactions   Demerol [Meperidine Hcl]     Penicillins Rash   Tramadol Nausea And Vomiting     Review of Systems: All systems reviewed and negative except where noted in HPI.   Lab Results  Component Value Date   WBC 10.1 02/16/2023   HGB 13.3 02/16/2023   HCT 40.5 02/16/2023   MCV 94.0 02/16/2023   PLT 793 (H) 02/16/2023    Lab Results  Component Value Date   NA 137 02/16/2023   CL 103 02/16/2023   K 4.1 02/16/2023   CO2 26 02/16/2023   BUN 24 (H) 02/16/2023   CREATININE 0.66 02/16/2023   GFRNONAA >60  02/16/2023   CALCIUM 9.9 02/16/2023   ALBUMIN 5.1 (H) 02/16/2023   GLUCOSE 92 02/16/2023    Lab Results  Component Value Date   ALT 12 02/16/2023   AST 20 02/16/2023   ALKPHOS 64 02/16/2023   BILITOT 0.5 02/16/2023     Physical Exam: BP (!) 150/86 (BP Location: Left Arm, Patient Position: Sitting, Cuff Size: Normal)   Pulse 76   Ht 5' (1.524 m)   Wt 137 lb (62.1 kg)   BMI 26.76 kg/m  Constitutional: Pleasant,well-developed, female in no acute distress. HEENT: Normocephalic and atraumatic. Conjunctivae are normal. No scleral icterus. Neck supple.  Cardiovascular: Normal rate, regular rhythm.  Pulmonary/chest: Effort normal and breath sounds normal. No wheezing, rales or rhonchi. Abdominal: Soft, nondistended, nontender. There are no masses palpable. No hepatomegaly. Extremities: no edema Lymphadenopathy: No cervical adenopathy noted. Neurological: Alert and oriented to person place and time. Skin: Skin is warm and dry. No rashes noted. Psychiatric: Normal mood and affect. Behavior is normal.   ASSESSMENT: 72 y.o. female here for assessment of the following  1. Positive colorectal cancer screening using Cologuard test   2. Hereditary hemochromatosis (HCC)    Positive Cologuard test.  We discussed what Cologuard is and differential diagnosis for making it a positive test - most likely colon polyps, less likely colon cancer, or false positive test.  She has never had a colonoscopy, prior tobacco  history, strongly recommend colonoscopy to further evaluate this.  She does not have any significant symptoms of bother her on a routine basis in regards to her bowel habits.  We discussed risks and benefits of colonoscopy and anesthesia and she wanted to proceed.  Further recommendations pending results.  Otherwise, her hemochromatosis was diagnosed many years ago and fortunately with treatment she has prevented any complications of this that we can tell so far.  No involvement of her heart based on cardiac MRI, her ultrasound looks good as well as labs, no evidence of cirrhosis.  She will continue to follow with hematology for this.  Harlin Rain, MD [facility]  CC: Lula Olszewski, MD

## 2023-03-29 NOTE — Patient Instructions (Addendum)
If your blood pressure at your visit was 140/90 or greater, please contact your primary care physician to follow up on this. ______________________________________________________  If you are age 71 or older, your body mass index should be between 23-30. Your Body mass index is 26.76 kg/m. If this is out of the aforementioned range listed, please consider follow up with your Primary Care Provider.  If you are age 45 or younger, your body mass index should be between 19-25. Your Body mass index is 26.76 kg/m. If this is out of the aformentioned range listed, please consider follow up with your Primary Care Provider.  ________________________________________________________  The [facility] providers would like to encourage you to use [facility] to communicate with providers for non-urgent requests or questions.  Due to long hold times on the telephone, sending your provider a message by [facility] may be a faster and more efficient way to get a response.  Please allow 48 business hours for a response.  Please remember that this is for non-urgent requests.  _______________________________________________________  Due to recent changes in healthcare laws, you may see the results of your imaging and laboratory studies on MyChart before your provider has had a chance to review them.  We understand that in some cases there may be results that are confusing or concerning to you. Not all laboratory results come back in the same time frame and the provider may be waiting for multiple results in order to interpret others.  Please give [place] 48 hours in order for your provider to thoroughly review all the results before contacting the office for clarification of your results.    You have been scheduled for a colonoscopy. Please follow written instructions given to you at your visit today.   Please pick up your prep supplies at the pharmacy within the next 1-3 days.  If you use inhalers (even only as needed), please  bring them with you on the day of your procedure.  DO NOT TAKE 7 DAYS PRIOR TO TEST- Trulicity (dulaglutide) Ozempic, Wegovy (semaglutide) Mounjaro (tirzepatide) Bydureon Bcise (exanatide extended release)  DO NOT TAKE 1 DAY PRIOR TO YOUR TEST Rybelsus (semaglutide) Adlyxin (lixisenatide) Victoza (liraglutide) Byetta (exanatide) ___________________________________________________________________________   Thank you for entrusting me with your care and for choosing [facility], Dr. Ileene Patrick

## 2023-03-30 ENCOUNTER — Telehealth: Payer: Self-pay | Admitting: Gastroenterology

## 2023-03-30 ENCOUNTER — Inpatient Hospital Stay: Payer: Medicare Other | Attending: Hematology and Oncology | Admitting: Hematology and Oncology

## 2023-03-30 ENCOUNTER — Other Ambulatory Visit (HOSPITAL_BASED_OUTPATIENT_CLINIC_OR_DEPARTMENT_OTHER): Payer: Self-pay

## 2023-03-30 ENCOUNTER — Inpatient Hospital Stay: Payer: Medicare Other

## 2023-03-30 ENCOUNTER — Telehealth: Payer: Self-pay

## 2023-03-30 VITALS — BP 139/85 | HR 78 | Temp 97.7°F | Resp 18 | Ht 60.0 in | Wt 137.4 lb

## 2023-03-30 DIAGNOSIS — R202 Paresthesia of skin: Secondary | ICD-10-CM | POA: Diagnosis not present

## 2023-03-30 DIAGNOSIS — D75839 Thrombocytosis, unspecified: Secondary | ICD-10-CM | POA: Diagnosis not present

## 2023-03-30 DIAGNOSIS — R200 Anesthesia of skin: Secondary | ICD-10-CM | POA: Insufficient documentation

## 2023-03-30 DIAGNOSIS — D473 Essential (hemorrhagic) thrombocythemia: Secondary | ICD-10-CM | POA: Diagnosis not present

## 2023-03-30 LAB — CBC WITH DIFFERENTIAL (CANCER CENTER ONLY)
Abs Immature Granulocytes: 0.02 10*3/uL (ref 0.00–0.07)
Basophils Absolute: 0 10*3/uL (ref 0.0–0.1)
Basophils Relative: 0 %
Eosinophils Absolute: 0.3 10*3/uL (ref 0.0–0.5)
Eosinophils Relative: 3 %
HCT: 39 % (ref 36.0–46.0)
Hemoglobin: 12.9 g/dL (ref 12.0–15.0)
Immature Granulocytes: 0 %
Lymphocytes Relative: 27 %
Lymphs Abs: 2.5 10*3/uL (ref 0.7–4.0)
MCH: 31.6 pg (ref 26.0–34.0)
MCHC: 33.1 g/dL (ref 30.0–36.0)
MCV: 95.6 fL (ref 80.0–100.0)
Monocytes Absolute: 0.7 10*3/uL (ref 0.1–1.0)
Monocytes Relative: 8 %
Neutro Abs: 5.9 10*3/uL (ref 1.7–7.7)
Neutrophils Relative %: 62 %
Platelet Count: 751 10*3/uL — ABNORMAL HIGH (ref 150–400)
RBC: 4.08 MIL/uL (ref 3.87–5.11)
RDW: 14.8 % (ref 11.5–15.5)
WBC Count: 9.5 10*3/uL (ref 4.0–10.5)
nRBC: 0 % (ref 0.0–0.2)

## 2023-03-30 LAB — CMP (CANCER CENTER ONLY)
ALT: 10 U/L (ref 0–44)
AST: 20 U/L (ref 15–41)
Albumin: 5 g/dL (ref 3.5–5.0)
Alkaline Phosphatase: 61 U/L (ref 38–126)
Anion gap: 7 (ref 5–15)
BUN: 16 mg/dL (ref 8–23)
CO2: 28 mmol/L (ref 22–32)
Calcium: 9.7 mg/dL (ref 8.9–10.3)
Chloride: 104 mmol/L (ref 98–111)
Creatinine: 0.6 mg/dL (ref 0.44–1.00)
GFR, Estimated: >60 mL/min (ref >60)
Glucose, Bld: 98 mg/dL (ref 70–99)
Potassium: 4.4 mmol/L (ref 3.5–5.1)
Sodium: 139 mmol/L (ref 135–145)
Total Bilirubin: 0.4 mg/dL (ref <1.2)
Total Protein: 7.6 g/dL (ref 6.5–8.1)

## 2023-03-30 LAB — FERRITIN: Ferritin: 45 ng/mL (ref 11–307)

## 2023-03-30 LAB — IRON AND IRON BINDING CAPACITY (CC-WL,HP ONLY)
Iron: 174 ug/dL — ABNORMAL HIGH (ref 28–170)
Saturation Ratios: 63 % — ABNORMAL HIGH (ref 10.4–31.8)
TIBC: 274 ug/dL (ref 250–450)
UIBC: 100 ug/dL — ABNORMAL LOW (ref 148–442)

## 2023-03-30 MED ORDER — VALACYCLOVIR HCL 1 G PO TABS: 1000.0000 mg | ORAL_TABLET | Freq: Two times a day (BID) | ORAL | 0 refills | Status: AC

## 2023-03-30 MED ORDER — NA SULFATE-K SULFATE-MG SULF 17.5-3.13-1.6 GM/177ML PO SOLN: 1.0000 | Freq: Once | ORAL | 0 refills | Status: AC

## 2023-03-30 NOTE — Telephone Encounter (Signed)
Suprep sent to [facility] on [address]

## 2023-03-30 NOTE — Assessment & Plan Note (Signed)
Lab: 06/18/21:  Hb 11.2, Platelet 778 07/30/2021: Hemoglobin 12.9, platelets 783, iron saturation 13%, ferritin 7  10/01/2021: Hemoglobin 14.1, platelets 828, ferritin 22 11/02/2021: Hemoglobin 12.9, platelets 435, ferritin 22, iron saturation 54%  04/06/2022: Platelets 763 05/20/2022: Platelets 782, hemoglobin 13.2, iron saturation 64%, ferritin 42, folate 13.3, B12 222 06/09/22: platelets 826 (shingles) 06/21/2022: Platelets 701, iron saturation 66%, ferritin 31 08/26/2022: Platelets 938, SPEP, KL ratio: Normal, rheumatoid factor: 10: Normal, ANA, SSA SSB: Negative 09/14/2022: Ferritin 30, iron saturation 56%, hemoglobin 13.8, platelets 951 12/30/2022: Hemoglobin 13.1, platelets 594, iron saturation 60%, ferritin 26 02/16/2023: Platelets 793    Current treatment: Since the patient is intolerant to anagrelide and hydroxyurea, currently on Jakafi at the low-dose of 5 mg twice a day.  Started 09/18/2022   Jakafi toxicities: Denies any major adverse effects to Jakafi Occasional nightmares and difficulty with sleeping   Hemochromatosis: On periodic phlebotomies. With the ferritin of only 12 we can cancel the phlebotomy for the time being   Return to clinic in 4 weeks with labs and follow-up

## 2023-03-30 NOTE — Telephone Encounter (Signed)
Pt called and states since coming home from seeing MD today, she feels she has shingles again inside her face. Per MD, we are sending in valacyclovir 1G 1 tab BID x 14 days. Per MD she needs to f/u with neurology moving forward regarding her shingles.

## 2023-03-30 NOTE — Progress Notes (Signed)
Patient Care Team: Lula Olszewski, MD as PCP - General (Internal Medicine) Serena Croissant, MD as Consulting Physician (Hematology and Oncology) Bensimhon, Bevelyn Buckles, MD as Consulting Physician (Cardiology)  DIAGNOSIS:  Encounter Diagnosis  Name Primary?   Essential thrombocytosis (HCC) Yes   CHIEF COMPLIANT: Follow-up of essential thrombocytosis and hemochromatosis  HISTORY OF PRESENT ILLNESS:  History of Present Illness   The patient, with a history of ET and Hemochromatosis and H/o shingles and currently on Jakafi medication, presents with persistent and spreading facial numbness and tingling. The numbness, initially localized to one side of the face, has been gradually spreading to the other side, particularly noticeable at night. The patient also reports a sensation of pressure in the head, which is sometimes accompanied by a feeling of her heart racing.  The patient has noticed an increase in scarring on her skin, which she believes started happening before she began [place]. She also reports dryness in her nose and eyes, which she attributes to the [facility] medication. The patient expresses concern about the possibility of internal shingles, given her history and the persistent symptoms.  The patient also mentions a history of facial surgery and wonders if the current symptoms could be related to that. She reports that the numbness and tingling are more pronounced on the side where the major incision was made during the surgery.  The patient also reports a decrease in her overall well-being, stating that she only gets three or four good hours a day before she starts feeling strange. She expresses concern about the potential long-term effects of her condition and the possibility of having a serious, undiagnosed condition.         ALLERGIES:  is allergic to demerol [meperidine hcl], penicillins, and tramadol.  MEDICATIONS:  Current Outpatient Medications  Medication Sig Dispense  Refill   aspirin EC 81 MG tablet Take 1 tablet (81 mg total) by mouth daily.     lidocaine (XYLOCAINE) 5 % ointment Apply 1 Application topically as needed. (Patient not taking: Reported on 03/29/2023) 35.44 g 0   Na Sulfate-K Sulfate-Mg Sulf 17.5-3.13-1.6 GM/177ML SOLN Take 1 kit by mouth once for 1 dose. 354 mL 0   ruxolitinib phosphate (JAKAFI) 5 MG tablet Take 1 tablet (5 mg total) by mouth 2 (two) times daily. 60 tablet 3   No current facility-administered medications for this visit.    PHYSICAL EXAMINATION: ECOG PERFORMANCE STATUS: 1 - Symptomatic but completely ambulatory  Vitals:   03/30/23 0900  BP: 139/85  Pulse: 78  Resp: 18  Temp: 97.7 F (36.5 C)  SpO2: 97%   Filed Weights   03/30/23 0900  Weight: 137 lb 6.4 oz (62.3 kg)      LABORATORY DATA:  I have reviewed the data as listed    Latest Ref Rng & Units 03/30/2023    8:50 AM 02/16/2023    8:42 AM 01/16/2023   10:53 AM  CMP  Glucose 70 - 99 mg/dL 98  92  93   BUN 8 - 23 mg/dL 16  24  9    Creatinine 0.44 - 1.00 mg/dL 4.09  8.11  9.14   Sodium 135 - 145 mmol/L 139  137  137   Potassium 3.5 - 5.1 mmol/L 4.4  4.1  4.2   Chloride 98 - 111 mmol/L 104  103  105   CO2 22 - 32 mmol/L 28  26  24    Calcium 8.9 - 10.3 mg/dL 9.7  9.9  9.6   Total  Protein 6.5 - 8.1 g/dL 7.6  7.8  7.2   Total Bilirubin <1.2 mg/dL 0.4  0.5  0.4   Alkaline Phos 38 - 126 U/L 61  64  47   AST 15 - 41 U/L 20  20  21    ALT 0 - 44 U/L 10  12  11      Lab Results  Component Value Date   WBC 9.5 03/30/2023   HGB 12.9 03/30/2023   HCT 39.0 03/30/2023   MCV 95.6 03/30/2023   PLT 751 (H) 03/30/2023   NEUTROABS 5.9 03/30/2023    ASSESSMENT & PLAN:  Essential thrombocytosis (HCC) Lab: 06/18/21:  Hb 11.2, Platelet 778 07/30/2021: Hemoglobin 12.9, platelets 783, iron saturation 13%, ferritin 7  10/01/2021: Hemoglobin 14.1, platelets 828, ferritin 22 11/02/2021: Hemoglobin 12.9, platelets 435, ferritin 22, iron saturation 54%  04/06/2022:  Platelets 763 05/20/2022: Platelets 782, hemoglobin 13.2, iron saturation 64%, ferritin 42, folate 13.3, B12 222 06/09/22: platelets 826 (shingles) 06/21/2022: Platelets 701, iron saturation 66%, ferritin 31 08/26/2022: Platelets 938, SPEP, KL ratio: Normal, rheumatoid factor: 10: Normal, ANA, SSA SSB: Negative 09/14/2022: Ferritin 30, iron saturation 56%, hemoglobin 13.8, platelets 951 12/30/2022: Hemoglobin 13.1, platelets 594, iron saturation 60%, ferritin 26 02/16/2023: Platelets 793    Current treatment: Since the patient is intolerant to anagrelide and hydroxyurea, currently on Jakafi at the low-dose of 5 mg twice a day.  Started 09/18/2022   Jakafi toxicities: Denies any major adverse effects to Jakafi Occasional nightmares and difficulty with sleeping   Hemochromatosis: On periodic phlebotomies. With the ferritin of only 12 we can cancel the phlebotomy for the time being   Return to clinic in 4 weeks with labs and follow-up   Facial Numbness Patient reports persistent numbness on the right side of the face, with recent onset of similar symptoms on the left side. Symptoms are more pronounced at night. Patient has a history of shingles and facial surgery. -Refer to neurology for further evaluation.  Skin Changes Clinical suspicion for recurrent shingles: Sent a prescription for Valtrex. Previously the symptoms got better with phlebotomy therefore we will proceed with another phlebotomy.  General Health Maintenance -Schedule phlebotomy around patient's existing appointments. -Follow-up in one month.          No orders of the defined types were placed in this encounter.  The patient has a good understanding of the overall plan. she agrees with it. she will call with any problems that may develop before the next visit here. Total time spent: 30 mins including face to face time and time spent for planning, charting and co-ordination of care   Tamsen Meek, MD 03/30/23

## 2023-03-30 NOTE — Telephone Encounter (Signed)
Inbound call from patient wishing to have prep medication to be sent to [facility] on [address]. States [facility] pharmacy did not accept Good Rx coupon. Please advise, thank you.

## 2023-04-05 ENCOUNTER — Ambulatory Visit: Payer: Medicare Other | Admitting: Hematology and Oncology

## 2023-04-05 ENCOUNTER — Ambulatory Visit: Payer: Medicare Other | Admitting: Internal Medicine

## 2023-04-05 ENCOUNTER — Encounter: Payer: Self-pay | Admitting: Internal Medicine

## 2023-04-05 ENCOUNTER — Other Ambulatory Visit: Payer: Medicare Other

## 2023-04-05 VITALS — BP 120/75 | HR 88 | Temp 97.3°F | Ht 60.0 in | Wt 137.0 lb

## 2023-04-05 DIAGNOSIS — I7121 Aneurysm of the ascending aorta, without rupture: Secondary | ICD-10-CM

## 2023-04-05 DIAGNOSIS — B0229 Other postherpetic nervous system involvement: Secondary | ICD-10-CM

## 2023-04-05 DIAGNOSIS — I700 Atherosclerosis of aorta: Secondary | ICD-10-CM | POA: Diagnosis not present

## 2023-04-05 DIAGNOSIS — J438 Other emphysema: Secondary | ICD-10-CM | POA: Diagnosis not present

## 2023-04-05 MED ORDER — PREGABALIN 50 MG PO CAPS
50.0000 mg | ORAL_CAPSULE | Freq: Three times a day (TID) | ORAL | 0 refills | Status: DC
Start: 2023-04-05 — End: 2023-05-31

## 2023-04-05 NOTE — Assessment & Plan Note (Signed)
Ascending Aortic Aneurysm   Imaging showed a well-managed aneurysm in the ascending aorta, currently too small for intervention. She was reassured that no one has ever died from hardening of the arteries of the aorta itself. We will schedule follow-up imaging in one year to monitor the aneurysm.

## 2023-04-05 NOTE — Patient Instructions (Signed)
It was a pleasure seeing you today! Your health and satisfaction are our top priorities.  Glenetta Hew, MD  VISIT SUMMARY:  During today's visit, we discussed your recent diagnosis of atherosclerosis, your stable ascending aortic aneurysm, mild emphysema, and persistent pain from postherpetic neuralgia. We also reviewed your proactive approach to managing your health through dietary changes and upcoming medical procedures.  YOUR PLAN:  -ATHEROSCLEROSIS: Atherosclerosis is the buildup of fats, cholesterol, and other substances in and on your artery walls, which can restrict blood flow. We recommend adopting a Mediterranean diet rich in extra virgin olive oil, fish, and avocados to manage your cholesterol levels. We will revisit your cholesterol levels at your next check-up to assess the impact of these dietary changes.  -ASCENDING AORTIC ANEURYSM: An ascending aortic aneurysm is an abnormal bulge in the wall of the aorta as it ascends from the heart. Your aneurysm is currently stable and too small for intervention. We will schedule follow-up imaging in one year to monitor its size.  -EMPHYSEMA: Emphysema is a lung condition that causes shortness of breath due to damage to the air sacs in the lungs. Your mild emphysema is likely due to past exposure to harmful substances. We recommend getting the full pneumonia vaccination series and will refer you to a pulmonologist for further evaluation and management.  -POSTHERPETIC NEURALGIA: Postherpetic neuralgia is a complication of shingles that causes persistent nerve pain. You experience this pain primarily at night and during flare-ups. We will prescribe Lyrica at a low dose as needed for pain management.  -GENERAL HEALTH MAINTENANCE: We discussed the need for a pneumonia vaccination due to your increased risk from emphysema. You prefer to wait until after your upcoming colonoscopy. We will administer the pneumonia vaccination after your  colonoscopy.  INSTRUCTIONS:  Please follow up with a pulmonologist for further evaluation of your emphysema. We will monitor your cholesterol levels and dietary changes at your next check-up. Follow-up imaging for your aortic aneurysm will be scheduled in one year.     NEXT STEPS: [x]  Early Intervention: Schedule sooner appointment, call our on-call services, or go to emergency room if there is any significant Increase in pain or discomfort New or worsening symptoms Sudden or severe changes in your health [x]  Flexible Follow-Up: We recommend a No follow-ups on file. for optimal routine care. This allows for progress monitoring and treatment adjustments. [x]  Preventive Care: Schedule your annual preventive care visit! It's typically covered by insurance and helps identify potential health issues early. [x]  Lab & X-ray Appointments: Incomplete tests scheduled today, or call to schedule. X-rays: [facility] (M-F, 8:30am-noon or 1pm-5pm). [x]  Medical Information Release: Sign a release form at front desk to obtain relevant medical information we don't have.  MAKING THE MOST OF OUR FOCUSED 20 MINUTE APPOINTMENTS: [x]   Clearly state your top concerns at the beginning of the visit to focus our discussion [x]   If you anticipate you will need more time, please inform the front desk during scheduling - we can book multiple appointments in the same week. [x]   If you have transportation problems- use our convenient video appointments or ask about transportation support. [x]   We can get down to business faster if you use MyChart to update information before the visit and submit non-urgent questions before your visit. Thank you for taking the time to provide details through MyChart.  Let our nurse know and she can import this information into your encounter documents.  Arrival and Wait Times: [x]   Arriving on time ensures that everyone receives prompt attention. [x]   Early morning (8a)  and afternoon (1p) appointments tend to have shortest wait times. [x]   Unfortunately, we cannot delay appointments for late arrivals or hold slots during phone calls.  Getting Answers and Following Up [x]   Simple Questions & Concerns: For quick questions or basic follow-up after your visit, reach [place] at [phone] or MyChart messaging. [x]   Complex Concerns: If your concern is more complex, scheduling an appointment might be best. Discuss this with the staff to find the most suitable option. [x]   Lab & Imaging Results: We'll contact you directly if results are abnormal or you don't use MyChart. Most normal results will be on MyChart within 2-3 business days, with a review message from Dr. Jon Billings. Haven't heard back in 2 weeks? Need results sooner? Contact us at [phone]. [x]   Referrals: Our referral coordinator will manage specialist referrals. The specialist's office should contact you within 2 weeks to schedule an appointment. Call us if you haven't heard from them after 2 weeks.  Staying Connected [x]   MyChart: Activate your MyChart for the fastest way to access results and message [place]. See the last page of this paperwork for instructions on how to activate.  Bring to Your Next Appointment [x]   Medications: Please bring all your medication bottles to your next appointment to ensure we have an accurate record of your prescriptions. [x]   Health Diaries: If you're monitoring any health conditions at home, keeping a diary of your readings can be very helpful for discussions at your next appointment.  Billing [x]   X-ray & Lab Orders: These are billed by separate companies. Contact the invoicing company directly for questions or concerns. [x]   Visit Charges: Discuss any billing inquiries with our administrative services team.  Your Satisfaction Matters [x]   Share Your Experience: We strive for your satisfaction! If you have any complaints, or preferably compliments, please let Dr.  Jon Billings know directly or contact our Practice Administrators, Edwena Felty or [employer], by asking at the front desk.   Reviewing Your Records [x]   Review this early draft of your clinical encounter notes below and the final encounter summary tomorrow on MyChart after its been completed.  All orders placed so far are visible here: Aortic atherosclerosis (HCC)  Other emphysema (HCC)  Aneurysm of ascending aorta without rupture (HCC)  Postherpetic neuralgia -     Pregabalin; Take 1 capsule (50 mg total) by mouth 3 (three) times daily.  Dispense: 90 capsule; Refill: 0

## 2023-04-05 NOTE — Assessment & Plan Note (Signed)
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 She is asymptomatic, with no significant shortness of breath or wheezing. We recommend the full pneumonia vaccination series and refer her to a pulmonologist for further evaluation and management.

## 2023-04-05 NOTE — Assessment & Plan Note (Signed)
Atherosclerosis   Recent imaging revealed minimal atherosclerosis with calcium deposits in her arterial walls, attributed to aging, hypertension, and potential side effects from [facility]. She expressed concerns about statin use, preferring dietary modifications over medication due to research suggesting minimal life extension benefits from statins. We will recommend a Mediterranean diet rich in extra virgin olive oil, fish, and avocados and revisit her cholesterol levels at the next check-up to assess the impact of these dietary changes.

## 2023-04-05 NOTE — Progress Notes (Signed)
[facility]: [phone]   -- Medical Office Visit --  Patient:  Kara [name]      Age: 72 y.o.       Sex:  female  Date:   04/05/2023 Today's Healthcare Provider: Lula Olszewski, MD  =======================================================================   Assessment & Plan Aortic atherosclerosis [facility]) Atherosclerosis   Recent imaging revealed minimal atherosclerosis with calcium deposits in her arterial walls, attributed to aging, hypertension, and potential side effects from [facility]. She expressed concerns about statin use, preferring dietary modifications over medication due to research suggesting minimal life extension benefits from statins. We will recommend a Mediterranean diet rich in extra virgin olive oil, fish, and avocados and revisit her cholesterol levels at the next check-up to assess the impact of these dietary changes. Other emphysema (HCC) Emphysema   Mild emphysema was noted on imaging, likely resulting from past exposure to harmful substances, with suspected exposure to mold and chemicals during grad school as a contributing factor. She is asymptomatic, with no significant shortness of breath or wheezing. We recommend the full pneumonia vaccination series and refer her to a pulmonologist for further evaluation and management. Aneurysm of ascending aorta without rupture [facility]) Ascending Aortic Aneurysm   Imaging showed a well-managed aneurysm in the ascending aorta, currently too small for intervention. She was reassured that no one has ever died from hardening of the arteries of the aorta itself. We will schedule follow-up imaging in one year to monitor the aneurysm. Postherpetic neuralgia Postherpetic Neuralgia   She experiences persistent pain following a shingles outbreak, consistent with postherpetic neuralgia, primarily at night and during flare-ups. Having tried gabapentin and preferring not to use it due to side effects, we will  prescribe Lyrica at a low dose as needed for pain management.     Orders Placed During this Encounter:   Diagnoses and all orders for this visit: Aortic atherosclerosis (HCC) Other emphysema (HCC) Aneurysm of ascending aorta without rupture (HCC) Postherpetic neuralgia -     pregabalin (LYRICA) 50 MG capsule; Take 1 capsule (50 mg total) by mouth 3 (three) times daily.   General Health Maintenance   We discussed the need for pneumonia vaccination due to increased risk from emphysema. She prefers to wait until after her upcoming colonoscopy. We will administer the pneumonia vaccination after the colonoscopy.  Follow-up   We will follow up with a pulmonologist for emphysema evaluation, monitor cholesterol levels and dietary changes at the next check-up, and schedule follow-up imaging for the aortic aneurysm in one year  SUBJECTIVE: 72 y.o. female who has Hereditary hemochromatosis (HCC); Essential thrombocytosis (HCC); Nasal obstruction; Facial numbness; Post herpetic neuralgia; History of smoking 25-50 pack years; Night sweats; Intrinsic aging of facial skin; Skin rash; Positive colorectal cancer screening using Cologuard test; Emphysema lung (HCC); Aortic aneurysm, thoracic (HCC); Lung nodules; Aortic atherosclerosis (HCC); and Abnormal mammogram of both breasts on their problem list.  Chief complaint: 3 month follow-up    AI-Extracted: Discussed the use of AI scribe software for clinical note transcription with the patient, who gave verbal consent to proceed.   The following HPI was synthesized by artificial intelligence using recording of the clinical interaction:  History of Present Illness The patient, with a history of atherosclerosis and emphysema, presents with concerns about her recent diagnosis of atherosclerosis, identified on a recent CT scan. She reports no symptoms related to this condition and was unaware of its presence until the recent imaging. The patient also has a known  aneurysm on the ascending aorta, which has remained stable in size.  The patient has been experiencing persistent pain, suspected to be post-herpetic neuralgia following a shingles outbreak. The pain is described as being located under the arm and on the chest, and seems to worsen at night. The patient also reports episodes of shortness of breath when lying down, which she suspects may be a side effect of her medication, Jakafi.  The patient has been proactive in managing her health, expressing a preference for dietary modifications over starting cholesterol medication. She plans to adopt a Mediterranean diet to manage her cholesterol levels and overall health. She also has an upcoming colonoscopy scheduled.  The patient has a history of exposure to potentially harmful chemicals during her time in graduate school, which she suspects may have contributed to her lung condition. She has been advised to consider a pneumonia vaccine due to her mild emphysema.  Past Medical History - History of hemochromatosis - History of platelet storage disease - History of emphysema - History of shingles infection  Social History - Works from home - Previously exposed to toxic chemicals Problem list overviews that were updated at today's visit:No problems updated.  Med reconciliation: Current Outpatient Medications on File Prior to Visit  Medication Sig   aspirin EC 81 MG tablet Take 1 tablet (81 mg total) by mouth daily.   lidocaine (XYLOCAINE) 5 % ointment Apply 1 Application topically as needed.   Na Sulfate-K Sulfate-Mg Sulf 17.5-3.13-1.6 GM/177ML SOLN TAKE AS DIRECTED ONCE FOR 1 DOSE   ruxolitinib phosphate (JAKAFI) 5 MG tablet Take 1 tablet (5 mg total) by mouth 2 (two) times daily.   valACYclovir (VALTREX) 1000 MG tablet Take 1 tablet (1,000 mg total) by mouth 2 (two) times daily for 14 days.   No current facility-administered medications on file prior to visit.  There are no discontinued  medications.    Objective   Physical Exam     04/05/2023    1:37 PM 03/30/2023    9:00 AM 03/29/2023    8:32 AM  Vitals with BMI  Height 5\' 0"  5\' 0"  5\' 0"   Weight 137 lbs 137 lbs 6 oz 137 lbs  BMI 26.76 26.83 26.76  Systolic 120 139 161  Diastolic 75 85 86  Pulse 88 78 76   Wt Readings from Last 10 Encounters:  04/05/23 137 lb (62.1 kg)  03/30/23 137 lb 6.4 oz (62.3 kg)  03/29/23 137 lb (62.1 kg)  02/16/23 134 lb 12.8 oz (61.1 kg)  02/01/23 134 lb (60.8 kg)  01/20/23 133 lb 8 oz (60.6 kg)  12/22/22 137 lb 6.4 oz (62.3 kg)  12/22/22 136 lb 14.4 oz (62.1 kg)  11/24/22 131 lb (59.4 kg)  11/18/22 131 lb 4.8 oz (59.6 kg)   Vital signs reviewed.  Nursing notes reviewed. Weight trend reviewed. Abnormalities and Problem-Specific physical exam findings:    General Appearance:  No acute distress appreciable.   Well-groomed, healthy-appearing female.  Well proportioned with no abnormal fat distribution.  Good muscle tone. Pulmonary:  Normal work of breathing at rest, no respiratory distress apparent. SpO2: 95 %  Musculoskeletal: All extremities are intact.  Neurological:  Awake, alert, oriented, and engaged.  No obvious focal neurological deficits or cognitive impairments.  Sensorium seems unclouded.   Speech is clear and coherent with logical content. Psychiatric:  Appropriate mood, pleasant and cooperative demeanor, thoughtful and engaged during the exam  Results          No results found for  any visits on 04/05/23. Appointment on 03/30/2023  Component Date Value   Sodium 03/30/2023 139    Potassium 03/30/2023 4.4    Chloride 03/30/2023 104    CO2 03/30/2023 28    Glucose, Bld 03/30/2023 98    BUN 03/30/2023 16    Creatinine 03/30/2023 0.60    Calcium 03/30/2023 9.7    Total Protein 03/30/2023 7.6    Albumin 03/30/2023 5.0    AST 03/30/2023 20    ALT 03/30/2023 10    Alkaline Phosphatase 03/30/2023 61    Total Bilirubin 03/30/2023 0.4    GFR, Estimated 03/30/2023 >60     Anion gap 03/30/2023 7    Iron 03/30/2023 174 (H)    TIBC 03/30/2023 274    Saturation Ratios 03/30/2023 63 (H)    UIBC 03/30/2023 100 (L)    Ferritin 03/30/2023 45    WBC Count 03/30/2023 9.5    RBC 03/30/2023 4.08    Hemoglobin 03/30/2023 12.9    HCT 03/30/2023 39.0    MCV 03/30/2023 95.6    MCH 03/30/2023 31.6    MCHC 03/30/2023 33.1    RDW 03/30/2023 14.8    Platelet Count 03/30/2023 751 (H)    nRBC 03/30/2023 0.0    Neutrophils Relative % 03/30/2023 62    Neutro Abs 03/30/2023 5.9    Lymphocytes Relative 03/30/2023 27    Lymphs Abs 03/30/2023 2.5    Monocytes Relative 03/30/2023 8    Monocytes Absolute 03/30/2023 0.7    Eosinophils Relative 03/30/2023 3    Eosinophils Absolute 03/30/2023 0.3    Basophils Relative 03/30/2023 0    Basophils Absolute 03/30/2023 0.0    Immature Granulocytes 03/30/2023 0    Abs Immature Granulocytes 03/30/2023 0.02   Appointment on 02/16/2023  Component Date Value   Sodium 02/16/2023 137    Potassium 02/16/2023 4.1    Chloride 02/16/2023 103    CO2 02/16/2023 26    Glucose, Bld 02/16/2023 92    BUN 02/16/2023 24 (H)    Creatinine 02/16/2023 0.66    Calcium 02/16/2023 9.9    Total Protein 02/16/2023 7.8    Albumin 02/16/2023 5.1 (H)    AST 02/16/2023 20    ALT 02/16/2023 12    Alkaline Phosphatase 02/16/2023 64    Total Bilirubin 02/16/2023 0.5    GFR, Estimated 02/16/2023 >60    Anion gap 02/16/2023 8    Iron 02/16/2023 221 (H)    TIBC 02/16/2023 308    Saturation Ratios 02/16/2023 72 (H)    UIBC 02/16/2023 87 (L)    Ferritin 02/16/2023 56    WBC Count 02/16/2023 10.1    RBC 02/16/2023 4.31    Hemoglobin 02/16/2023 13.3    HCT 02/16/2023 40.5    MCV 02/16/2023 94.0    MCH 02/16/2023 30.9    MCHC 02/16/2023 32.8    RDW 02/16/2023 14.3    Platelet Count 02/16/2023 793 (H)    nRBC 02/16/2023 0.0    Neutrophils Relative % 02/16/2023 59    Neutro Abs 02/16/2023 6.0    Lymphocytes Relative 02/16/2023 30    Lymphs Abs  02/16/2023 3.1    Monocytes Relative 02/16/2023 7    Monocytes Absolute 02/16/2023 0.7    Eosinophils Relative 02/16/2023 3    Eosinophils Absolute 02/16/2023 0.3    Basophils Relative 02/16/2023 1    Basophils Absolute 02/16/2023 0.1    Immature Granulocytes 02/16/2023 0    Abs Immature Granulocytes 02/16/2023 0.04   Appointment on 01/16/2023  Component Date  Value   Sodium 01/16/2023 137    Potassium 01/16/2023 4.2    Chloride 01/16/2023 105    CO2 01/16/2023 24    Glucose, Bld 01/16/2023 93    BUN 01/16/2023 9    Creatinine 01/16/2023 0.64    Calcium 01/16/2023 9.6    Total Protein 01/16/2023 7.2    Albumin 01/16/2023 4.9    AST 01/16/2023 21    ALT 01/16/2023 11    Alkaline Phosphatase 01/16/2023 47    Total Bilirubin 01/16/2023 0.4    GFR, Estimated 01/16/2023 >60    Anion gap 01/16/2023 8    Iron 01/16/2023 164    TIBC 01/16/2023 274    Saturation Ratios 01/16/2023 60 (H)    UIBC 01/16/2023 110 (L)    Ferritin 01/16/2023 26    WBC Count 01/16/2023 7.5    RBC 01/16/2023 4.25    Hemoglobin 01/16/2023 13.1    HCT 01/16/2023 39.7    MCV 01/16/2023 93.4    MCH 01/16/2023 30.8    MCHC 01/16/2023 33.0    RDW 01/16/2023 14.3    Platelet Count 01/16/2023 594 (H)    nRBC 01/16/2023 0.0    Neutrophils Relative % 01/16/2023 62    Neutro Abs 01/16/2023 4.7    Lymphocytes Relative 01/16/2023 28    Lymphs Abs 01/16/2023 2.1    Monocytes Relative 01/16/2023 7    Monocytes Absolute 01/16/2023 0.6    Eosinophils Relative 01/16/2023 3    Eosinophils Absolute 01/16/2023 0.2    Basophils Relative 01/16/2023 0    Basophils Absolute 01/16/2023 0.0    Immature Granulocytes 01/16/2023 0    Abs Immature Granulocytes 01/16/2023 0.03   Hospital Outpatient Visit on 12/29/2022  Component Date Value   SURGICAL PATHOLOGY 12/29/2022                     Value:SURGICAL PATHOLOGY [facility] [address] [place], [place] [address] Telephone [phone] or  [phone] Fax [phone]  REPORT OF SURGICAL PATHOLOGY   Accession #: [id number] Patient Name: LOUANN, HUEBERT Visit # : [id number]  MRN: [id number] Physician: Emmaline Kluver DOB/Age Aug 27, 1950 (Age: 89) Gender: F Collected Date: 12/29/2022 Received Date: 12/29/2022  FINAL DIAGNOSIS       1. Breast, right, needle core biopsy, mass, 12:00 :       BENIGN BREAST TISSUE WITH STROMAL FIBROSIS.      NEGATIVE FOR MALIGNANCY.      SEE NOTE.       2. Breast, right, needle core biopsy, 2:00, mass :       BENIGN BREAST TISSUE WITH STROMAL FIBROSIS.      NEGATIVE FOR MALIGNANCY.      SEE NOTE.       3. Breast, left, needle core biopsy, 1:00, mass :       BENIGN BREAST TISSUE WITH STROMAL FIBROSIS.      NEGATIVE FOR MALIGNANCY.      SEE NOTE.       Diagnosis Note : - 3.[facility] was notified on 12/30/2022.                               ELECTRONIC SIGNATURE : Jacelyn Grip, John, Pathologist, Electronic Signature  MICROSCOPIC DESCRIPTION  CASE COMMENTS STAINS USED IN DIAGNOSIS: H&E-2 H&E-3 H&E-4 H&E H&E-2 H&E-3 H&E-4 H&E H&E-2 H&E-3 H&E-4 H&E    CLINICAL HISTORY  SPECIMEN(S) OBTAINED 1. Breast, right, needle  core biopsy, Mass, 12:00 2. Breast, right, needle core biopsy, 2:00, Mass 3. Breast, left, needle core biopsy, 1:00, Mass  SPECIMEN COMMENTS: 1. In formalin: 1:30, CIT < 1 min, ribbon clip 2. In formalin: 1:45, CIT < 1 min, coil clip 3. In formalin: 2:15, CIT < 1 min, ribbon clip SPECIMEN CLINICAL INFORMATION: 1. Multiple bilateral similar appearing oval masses likely FA R/O atypia or malignancy    Gross Description 1. Received in formalin labeled with the patient's name and "right breast 12 o'clock, 1 cm from nipple" are four fragments to cores of fibroadipose tissue ranging from 0.5 to 1.0 cm in length, each measuring 0.1 cm in diameter.The specimen is entirely submitte                         d in one block.      TIF  1:30 p.m. CIT less than 1 min. 2. Received in formalin labeled with the patient's name and "right breast 2 o'clock, 4 cm from nipple" are four cores of fibroadipose tissue ranging from 0.7 to 1.1 cm in length, each measuring 0.1 cm in diameter.The specimen is entirely submitted in one block.      TIF 1:45 p.m. CIT less than 1 min. 3. Received in formalin labeled with the patient's name and "left breast 1 o'clock, 6 cm from nipple" are five fragments to cores of fibroadipose tissue ranging from 0.4 to 1.3 cm in length, each measuring 0.1 cm in diameter.The specimen is entirely submitted in one block.      TIF 2:15 p.m. CIT less than 1 min. (KW:gt, 12/30/22)        Report signed out from the following location(s) [place] [address], [place] [address] CLIA #: [id number]  [facility] [address] [place], [place] [address] CLIA #: [id number]   Appointment on 12/16/2022  Component Date Value   WBC Count 12/16/2022 8.1    RBC 12/16/2022 3.79 (L)    Hemoglobin 12/16/2022 11.8 (L)    HCT 12/16/2022 35.8 (L)    MCV 12/16/2022 94.5    MCH 12/16/2022 31.1    MCHC 12/16/2022 33.0    RDW 12/16/2022 15.0    Platelet Count 12/16/2022 725 (H)    nRBC 12/16/2022 0.0    Neutrophils Relative % 12/16/2022 61    Neutro Abs 12/16/2022 4.9    Lymphocytes Relative 12/16/2022 27    Lymphs Abs 12/16/2022 2.2    Monocytes Relative 12/16/2022 8    Monocytes Absolute 12/16/2022 0.7    Eosinophils Relative 12/16/2022 3    Eosinophils Absolute 12/16/2022 0.3    Basophils Relative 12/16/2022 1    Basophils Absolute 12/16/2022 0.0    Immature Granulocytes 12/16/2022 0    Abs Immature Granulocytes 12/16/2022 0.03    Ferritin 12/16/2022 12    Iron 12/16/2022 128    TIBC 12/16/2022 330    Saturation Ratios 12/16/2022 39 (H)    UIBC 12/16/2022 202    Sodium 12/16/2022 138    Potassium 12/16/2022 4.1    Chloride 12/16/2022 108    CO2 12/16/2022  22    Glucose, Bld 12/16/2022 103 (H)    BUN 12/16/2022 17    Creatinine 12/16/2022 0.67    Calcium 12/16/2022 9.3    Total Protein 12/16/2022 6.9    Albumin 12/16/2022 4.5    AST 12/16/2022 16    ALT 12/16/2022 9    Alkaline Phosphatase 12/16/2022 64  Total Bilirubin 12/16/2022 0.3    GFR, Estimated 12/16/2022 >60    Anion gap 12/16/2022 8   Appointment on 11/18/2022  Component Date Value   Sodium 11/18/2022 137    Potassium 11/18/2022 4.1    Chloride 11/18/2022 105    CO2 11/18/2022 23    Glucose, Bld 11/18/2022 88    BUN 11/18/2022 17    Creatinine 11/18/2022 0.66    Calcium 11/18/2022 9.6    Total Protein 11/18/2022 7.2    Albumin 11/18/2022 4.9    AST 11/18/2022 19    ALT 11/18/2022 9    Alkaline Phosphatase 11/18/2022 56    Total Bilirubin 11/18/2022 0.5    GFR, Estimated 11/18/2022 >60    Anion gap 11/18/2022 9    Iron 11/18/2022 177 (H)    TIBC 11/18/2022 265    Saturation Ratios 11/18/2022 67 (H)    UIBC 11/18/2022 88 (L)    Ferritin 11/18/2022 43    WBC Count 11/18/2022 9.3    RBC 11/18/2022 4.27    Hemoglobin 11/18/2022 13.3    HCT 11/18/2022 39.0    MCV 11/18/2022 91.3    MCH 11/18/2022 31.1    MCHC 11/18/2022 34.1    RDW 11/18/2022 14.5    Platelet Count 11/18/2022 739 (H)    nRBC 11/18/2022 0.0    Neutrophils Relative % 11/18/2022 61    Neutro Abs 11/18/2022 5.7    Lymphocytes Relative 11/18/2022 29    Lymphs Abs 11/18/2022 2.7    Monocytes Relative 11/18/2022 7    Monocytes Absolute 11/18/2022 0.6    Eosinophils Relative 11/18/2022 3    Eosinophils Absolute 11/18/2022 0.2    Basophils Relative 11/18/2022 0    Basophils Absolute 11/18/2022 0.0    Immature Granulocytes 11/18/2022 0    Abs Immature Granulocytes 11/18/2022 0.03   Appointment on 10/12/2022  Component Date Value   Sodium 10/12/2022 138    Potassium 10/12/2022 4.2    Chloride 10/12/2022 105    CO2 10/12/2022 25    Glucose, Bld 10/12/2022 111 (H)    BUN 10/12/2022 16     Creatinine 10/12/2022 0.63    Calcium 10/12/2022 9.4    Total Protein 10/12/2022 7.1    Albumin 10/12/2022 4.4    AST 10/12/2022 17    ALT 10/12/2022 9    Alkaline Phosphatase 10/12/2022 70    Total Bilirubin 10/12/2022 0.3    GFR, Estimated 10/12/2022 >60    Anion gap 10/12/2022 8    Iron 10/12/2022 122    TIBC 10/12/2022 315    Saturation Ratios 10/12/2022 39 (H)    UIBC 10/12/2022 193    Ferritin 10/12/2022 29    WBC Count 10/12/2022 10.1    RBC 10/12/2022 3.76 (L)    Hemoglobin 10/12/2022 11.7 (L)    HCT 10/12/2022 34.9 (L)    MCV 10/12/2022 92.8    MCH 10/12/2022 31.1    MCHC 10/12/2022 33.5    RDW 10/12/2022 15.3    Platelet Count 10/12/2022 769 (H)    nRBC 10/12/2022 0.0    Neutrophils Relative % 10/12/2022 63    Neutro Abs 10/12/2022 6.3    Lymphocytes Relative 10/12/2022 27    Lymphs Abs 10/12/2022 2.7    Monocytes Relative 10/12/2022 6    Monocytes Absolute 10/12/2022 0.6    Eosinophils Relative 10/12/2022 3    Eosinophils Absolute 10/12/2022 0.3    Basophils Relative 10/12/2022 1    Basophils Absolute 10/12/2022 0.1    Immature Granulocytes 10/12/2022 0  Abs Immature Granulocytes 10/12/2022 0.02   Office Visit on 09/26/2022  Component Date Value   COLOGUARD 10/26/2022 Positive (A)   Appointment on 09/22/2022  Component Date Value   Sodium 09/22/2022 137    Potassium 09/22/2022 4.2    Chloride 09/22/2022 106    CO2 09/22/2022 24    Glucose, Bld 09/22/2022 98    BUN 09/22/2022 12    Creatinine 09/22/2022 0.60    Calcium 09/22/2022 9.3    Total Protein 09/22/2022 7.1    Albumin 09/22/2022 4.6    AST 09/22/2022 18    ALT 09/22/2022 9    Alkaline Phosphatase 09/22/2022 68    Total Bilirubin 09/22/2022 0.4    GFR, Estimated 09/22/2022 >60    Anion gap 09/22/2022 7    WBC Count 09/22/2022 9.8    RBC 09/22/2022 4.07    Hemoglobin 09/22/2022 12.5    HCT 09/22/2022 37.0    MCV 09/22/2022 90.9    MCH 09/22/2022 30.7    MCHC 09/22/2022 33.8    RDW  09/22/2022 15.2    Platelet Count 09/22/2022 908 (HH)    nRBC 09/22/2022 0.0    Neutrophils Relative % 09/22/2022 63    Neutro Abs 09/22/2022 6.3    Lymphocytes Relative 09/22/2022 26    Lymphs Abs 09/22/2022 2.5    Monocytes Relative 09/22/2022 7    Monocytes Absolute 09/22/2022 0.7    Eosinophils Relative 09/22/2022 3    Eosinophils Absolute 09/22/2022 0.2    Basophils Relative 09/22/2022 1    Basophils Absolute 09/22/2022 0.1    Immature Granulocytes 09/22/2022 0    Abs Immature Granulocytes 09/22/2022 0.03   Appointment on 09/14/2022  Component Date Value   Iron 09/14/2022 156    TIBC 09/14/2022 277    Saturation Ratios 09/14/2022 56 (H)    UIBC 09/14/2022 121 (L)    Ferritin 09/14/2022 30   There may be more visits with results that are not included.  No image results found. CT CHEST LCS NODULE F/U LOW DOSE WO CONTRAST Result Date: 03/02/2023 CLINICAL DATA:  Follow-up of pulmonary nodule. Ex-smoker quitting last year EXAM: CT CHEST WITHOUT CONTRAST FOR LUNG CANCER SCREENING NODULE FOLLOW-UP TECHNIQUE: Multidetector CT imaging of the chest was performed following the standard protocol without IV contrast. RADIATION DOSE REDUCTION: This exam was performed according to the departmental dose-optimization program which includes automated exposure control, adjustment of the mA and/or kV according to patient size and/or use of iterative reconstruction technique. COMPARISON:  11/09/2022 FINDINGS: Cardiovascular: Aortic atherosclerosis. Ascending aortic dilatation including at 4.2 cm, similar. Mild cardiomegaly, without pericardial effusion. Mediastinum/Nodes: No mediastinal or hilar adenopathy, given limitations of unenhanced CT. Lungs/Pleura: No pleural fluid. Lingular scarring. Mild centrilobular emphysema. Anteromedial right upper lobe scarring. The left upper lobe pulmonary nodule of volume derived equivalent diameter 6.8 mm is unchanged. Other nodules of up to 3.5 mm are also similar.  Upper Abdomen: Normal imaged portions of the liver, spleen, stomach, pancreas, gallbladder, adrenal glands, left kidney. Musculoskeletal: Osteopenia. IMPRESSION: 1. Lung-RADS 2, benign appearance or behavior. Continue annual screening with low-dose chest CT without contrast in 12 months. 2. Similar ascending aortic aneurysm at 4.2 cm. This can be re-evaluated on routine lung cancer screening CT. 3. Aortic atherosclerosis (ICD10-I70.0) and emphysema (ICD10-J43.9). Electronically Signed   By: Jeronimo Greaves M.D.   On: 03/02/2023 17:39  CT CHEST LCS NODULE F/U LOW DOSE WO CONTRAST Result Date: 03/02/2023 CLINICAL DATA:  Follow-up of pulmonary nodule. Ex-smoker quitting last year EXAM: CT CHEST WITHOUT  CONTRAST FOR LUNG CANCER SCREENING NODULE FOLLOW-UP TECHNIQUE: Multidetector CT imaging of the chest was performed following the standard protocol without IV contrast. RADIATION DOSE REDUCTION: This exam was performed according to the departmental dose-optimization program which includes automated exposure control, adjustment of the mA and/or kV according to patient size and/or use of iterative reconstruction technique. COMPARISON:  11/09/2022 FINDINGS: Cardiovascular: Aortic atherosclerosis. Ascending aortic dilatation including at 4.2 cm, similar. Mild cardiomegaly, without pericardial effusion. Mediastinum/Nodes: No mediastinal or hilar adenopathy, given limitations of unenhanced CT. Lungs/Pleura: No pleural fluid. Lingular scarring. Mild centrilobular emphysema. Anteromedial right upper lobe scarring. The left upper lobe pulmonary nodule of volume derived equivalent diameter 6.8 mm is unchanged. Other nodules of up to 3.5 mm are also similar. Upper Abdomen: Normal imaged portions of the liver, spleen, stomach, pancreas, gallbladder, adrenal glands, left kidney. Musculoskeletal: Osteopenia. IMPRESSION: 1. Lung-RADS 2, benign appearance or behavior. Continue annual screening with low-dose chest CT without contrast in  12 months. 2. Similar ascending aortic aneurysm at 4.2 cm. This can be re-evaluated on routine lung cancer screening CT. 3. Aortic atherosclerosis (ICD10-I70.0) and emphysema (ICD10-J43.9). Electronically Signed   By: Jeronimo Greaves M.D.   On: 03/02/2023 17:39         Additional Info: This encounter employed real-time, collaborative documentation. The patient actively reviewed and updated their medical record on a shared screen, ensuring transparency and facilitating joint problem-solving for the problem list, overview, and plan. This approach promotes accurate, informed care. The treatment plan was discussed and reviewed in detail, including medication safety, potential side effects, and all patient questions. We confirmed understanding and comfort with the plan. Follow-up instructions were established, including contacting the office for any concerns, returning if symptoms worsen, persist, or new symptoms develop, and precautions for potential emergency department visits.

## 2023-04-06 NOTE — Telephone Encounter (Signed)
Oral Oncology Patient Advocate Encounter   Submitted POI for assistance for Jakafi to [facility].   Application submitted via e-fax to [phone]   [facility] phone number [phone].   I will continue to check the status until final determination.   Jinger Neighbors, CPhT-Adv Oncology Pharmacy Patient Advocate [facility] Direct Number: [phone]  Fax: [phone]

## 2023-04-07 ENCOUNTER — Telehealth: Payer: Self-pay | Admitting: Gastroenterology

## 2023-04-07 ENCOUNTER — Inpatient Hospital Stay: Payer: Medicare Other

## 2023-04-07 DIAGNOSIS — D473 Essential (hemorrhagic) thrombocythemia: Secondary | ICD-10-CM | POA: Diagnosis not present

## 2023-04-07 DIAGNOSIS — R200 Anesthesia of skin: Secondary | ICD-10-CM | POA: Diagnosis not present

## 2023-04-07 DIAGNOSIS — R202 Paresthesia of skin: Secondary | ICD-10-CM | POA: Diagnosis not present

## 2023-04-07 DIAGNOSIS — D75839 Thrombocytosis, unspecified: Secondary | ICD-10-CM | POA: Diagnosis not present

## 2023-04-07 NOTE — Telephone Encounter (Signed)
Patient states that she forgot she was not supposed to have nuts or fresh vegetables prior to procedure. Patient had chocolate with small amount of nuts and also had a couple tablespoons of fresh salsa a couple days ago. She questions if she can keep her scheduled colonoscopy on 04/10/23 or if she needs to reschedule. Advised patient that she may keep her scheduled colonoscopy but should follow recommended diet at this time. No additional nuts or fresh vegetables/seeds. Patient verbalizes understanding.

## 2023-04-07 NOTE — Progress Notes (Signed)
Kara [name] presents today for phlebotomy per MD orders. 18G secondary kit used in left forearm.  Phlebotomy procedure started at 1511 and ended at 1530. 503 cc removed. Patient tolerated procedure well. IV needle removed intact. VSS at time of discharge.

## 2023-04-07 NOTE — Telephone Encounter (Signed)
PT is scheduled to have a colonoscopy on 12/23 and ate chocoloate with nuts on Wednesday and yesterday she had fresh salsa. She wants to know can she still proceed with the procedure. Please advise.

## 2023-04-10 ENCOUNTER — Encounter: Payer: Self-pay | Admitting: Gastroenterology

## 2023-04-10 ENCOUNTER — Ambulatory Visit: Payer: Medicare Other | Admitting: Gastroenterology

## 2023-04-10 VITALS — BP 117/78 | HR 66 | Temp 97.8°F | Resp 18 | Ht 60.0 in | Wt 137.0 lb

## 2023-04-10 DIAGNOSIS — D123 Benign neoplasm of transverse colon: Secondary | ICD-10-CM

## 2023-04-10 DIAGNOSIS — D125 Benign neoplasm of sigmoid colon: Secondary | ICD-10-CM

## 2023-04-10 DIAGNOSIS — D128 Benign neoplasm of rectum: Secondary | ICD-10-CM | POA: Diagnosis not present

## 2023-04-10 DIAGNOSIS — Z1211 Encounter for screening for malignant neoplasm of colon: Secondary | ICD-10-CM | POA: Diagnosis not present

## 2023-04-10 DIAGNOSIS — D120 Benign neoplasm of cecum: Secondary | ICD-10-CM | POA: Diagnosis not present

## 2023-04-10 DIAGNOSIS — R195 Other fecal abnormalities: Secondary | ICD-10-CM

## 2023-04-10 DIAGNOSIS — K635 Polyp of colon: Secondary | ICD-10-CM | POA: Diagnosis not present

## 2023-04-10 DIAGNOSIS — K5730 Diverticulosis of large intestine without perforation or abscess without bleeding: Secondary | ICD-10-CM | POA: Diagnosis not present

## 2023-04-10 DIAGNOSIS — K648 Other hemorrhoids: Secondary | ICD-10-CM

## 2023-04-10 DIAGNOSIS — K621 Rectal polyp: Secondary | ICD-10-CM | POA: Diagnosis not present

## 2023-04-10 MED ORDER — SODIUM CHLORIDE 0.9 % IV SOLN: 500.0000 mL | Freq: Once | INTRAVENOUS | Status: AC

## 2023-04-10 NOTE — Progress Notes (Signed)
Sedate, gd SR, tolerated procedure well, VSS, report to RN 

## 2023-04-10 NOTE — Op Note (Signed)
[facility] Patient Name: Kara [name] Procedure Date: 04/10/2023 2:36 PM MRN: [id number] Endoscopist: Viviann Spare P. Adela Lank , MD, [id number] Age: 72 Referring MD:  Date of Birth: 12/23/1950 Gender: Female Account #: [id number] Procedure:                Colonoscopy Indications:              Positive Cologuard test - first time exam Medicines:                Monitored Anesthesia Care Procedure:                Pre-Anesthesia Assessment:                           - Prior to the procedure, a History and Physical                            was performed, and patient medications and                            allergies were reviewed. The patient's tolerance of                            previous anesthesia was also reviewed. The risks                            and benefits of the procedure and the sedation                            options and risks were discussed with the patient.                            All questions were answered, and informed consent                            was obtained. Prior Anticoagulants: The patient has                            taken no anticoagulant or antiplatelet agents. ASA                            Grade Assessment: III - A patient with severe                            systemic disease. After reviewing the risks and                            benefits, the patient was deemed in satisfactory                            condition to undergo the procedure.                           After obtaining informed consent, the colonoscope  was passed under direct vision. Throughout the                            procedure, the patient's blood pressure, pulse, and                            oxygen saturations were monitored continuously. The                            [id number]-HQ190L Colonoscope [id number] was introduced                            through the anus and advanced to the the cecum,                            identified by  appendiceal orifice and ileocecal                            valve. The colonoscopy was performed without                            difficulty. The patient tolerated the procedure                            well. The quality of the bowel preparation was                            adequate. The ileocecal valve, appendiceal orifice,                            and rectum were photographed. Scope In: 2:38:36 PM Scope Out: 3:03:40 PM Scope Withdrawal Time: 0 hours 20 minutes 17 seconds  Total Procedure Duration: 0 hours 25 minutes 4 seconds  Findings:                 The perianal and digital rectal examinations were                            normal.                           A 4 mm polyp was found in the cecum. The polyp was                            sessile. The polyp was removed with a cold snare.                            Resection and retrieval were complete.                           A diminutive polyp was found in the transverse                            colon. The polyp was sessile. The polyp was removed  with a cold snare. Resection and retrieval were                            complete.                           A 4 mm polyp was found in the sigmoid colon. The                            polyp was sessile. The polyp was removed with a                            cold snare. Resection and retrieval were complete.                           Three sessile polyps were found in the rectum. The                            polyps were 2 to 4 mm in size. These polyps were                            removed with a cold snare. Resection and retrieval                            were complete.                           Many diverticula were found in the entire colon.                           Internal hemorrhoids were found during                            retroflexion. The hemorrhoids were small.                           The exam was otherwise without  abnormality. Complications:            No immediate complications. Estimated blood loss:                            Minimal. Estimated Blood Loss:     Estimated blood loss was minimal. Impression:               - One 4 mm polyp in the cecum, removed with a cold                            snare. Resected and retrieved.                           - One diminutive polyp in the transverse colon,                            removed with a cold snare. Resected and retrieved.                           -  One 4 mm polyp in the sigmoid colon, removed with                            a cold snare. Resected and retrieved.                           - Three 2 to 4 mm polyps in the rectum, removed                            with a cold snare. Resected and retrieved.                           - Diverticulosis in the entire examined colon.                           - Internal hemorrhoids.                           - The examination was otherwise normal. Recommendation:           - Patient has a contact number available for                            emergencies. The signs and symptoms of potential                            delayed complications were discussed with the                            patient. Return to normal activities tomorrow.                            Written discharge instructions were provided to the                            patient.                           - Resume previous diet.                           - Continue present medications.                           - Await pathology results. Viviann Spare P. Iyanna Drummer, MD 04/10/2023 3:10:22 PM This report has been signed electronically.

## 2023-04-10 NOTE — Progress Notes (Signed)
Pt's states no medical or surgical changes since previsit or office visit. 

## 2023-04-10 NOTE — Progress Notes (Signed)
Called to room to assist during endoscopic procedure.  Patient ID and intended procedure confirmed with present staff. Received instructions for my participation in the procedure from the performing physician.  

## 2023-04-10 NOTE — Progress Notes (Signed)
History and Physical Interval Note: Seen on 03/29/23 - positive Cologuard, first time colonoscopy. No baseline bowel symptoms. No interval changes since the office visit. Have discussed colonoscopy with me - risks / benefits, she wants to proceed.    04/10/2023 2:23 PM  Kara [name]  has presented today for endoscopic procedure(s), with the diagnosis of  Encounter Diagnosis  Name Primary?   Positive colorectal cancer screening using Cologuard test Yes  .  The various methods of evaluation and treatment have been discussed with the patient and/or family. After consideration of risks, benefits and other options for treatment, the patient has consented to  the endoscopic procedure(s).   The patient's history has been reviewed, patient examined, no change in status, stable for surgery.  I have reviewed the patient's chart and labs.  Questions were answered to the patient's satisfaction.    Harlin Rain, MD [facility]

## 2023-04-10 NOTE — Patient Instructions (Addendum)
Recommendation:  - Patient has a contact number available for emergencies. The signs and symptoms of potential delayed complications were discussed with the patient. Return to normal activities tomorrow. Written discharge instructions were provided to the patient.  - Resume previous diet.  - Continue present medications.   YOU HAD AN ENDOSCOPIC PROCEDURE TODAY AT THE [facility]:   Refer to the procedure report that was given to you for any specific questions about what was found during the examination.  If the procedure report does not answer your questions, please call your gastroenterologist to clarify.  If you requested that your care partner not be given the details of your procedure findings, then the procedure report has been included in a sealed envelope for you to review at your convenience later.  YOU SHOULD EXPECT: Some feelings of bloating in the abdomen. Passage of more gas than usual.  Walking can help get rid of the air that was put into your GI tract during the procedure and reduce the bloating. If you had a lower endoscopy (such as a colonoscopy or flexible sigmoidoscopy) you may notice spotting of blood in your stool or on the toilet paper. If you underwent a bowel prep for your procedure, you may not have a normal bowel movement for a few days.  Please Note:  You might notice some irritation and congestion in your nose or some drainage.  This is from the oxygen used during your procedure.  There is no need for concern and it should clear up in a day or so.  SYMPTOMS TO REPORT IMMEDIATELY:  Following lower endoscopy (colonoscopy or flexible sigmoidoscopy):  Excessive amounts of blood in the stool  Significant tenderness or worsening of abdominal pains  Swelling of the abdomen that is new, acute  Fever of 100F or higher   For urgent or emergent issues, a gastroenterologist can be reached at any hour by calling [phone]. Do not use MyChart messaging for  urgent concerns.    DIET:  We do recommend a small meal at first, but then you may proceed to your regular diet.  Drink plenty of fluids but you should avoid alcoholic beverages for 24 hours.  MEDICATIONS: Continue present medications.  Please see handouts given to you by your recovery nurse: Polyps, Diverticulosis, Hemorrhoids.  FOLLOW UP: Await pathology results.  Thank you for allowing [place] to provide for your healthcare needs today.  ACTIVITY:  You should plan to take it easy for the rest of today and you should NOT DRIVE or use heavy machinery until tomorrow (because of the sedation medicines used during the test).    FOLLOW UP: Our staff will call the number listed on your records the next business day following your procedure.  We will call around 7:15- 8:00 am to check on you and address any questions or concerns that you may have regarding the information given to you following your procedure. If we do not reach you, we will leave a message.     If any biopsies were taken you will be contacted by phone or by letter within the next 1-3 weeks.  Please call us at [phone] if you have not heard about the biopsies in 3 weeks.    SIGNATURES/CONFIDENTIALITY: You and/or your care partner have signed paperwork which will be entered into your electronic medical record.  These signatures attest to the fact that that the information above on your After Visit Summary has been reviewed and is understood.  Full responsibility  of the confidentiality of this discharge information lies with you and/or your care-partner.

## 2023-04-11 ENCOUNTER — Telehealth: Payer: Self-pay

## 2023-04-11 NOTE — Telephone Encounter (Signed)
  Follow up Call-     04/10/2023    1:46 PM  Call back number  Post procedure Call Back phone  # [phone]  Permission to leave phone message Yes     Patient questions:  Do you have a fever, pain , or abdominal swelling? No. Pain Score  0 *  Have you tolerated food without any problems? Yes.    Have you been able to return to your normal activities? Yes.    Do you have any questions about your discharge instructions: Diet   No. Medications  No. Follow up visit  No.  Do you have questions or concerns about your Care? No.  Actions: * If pain score is 4 or above: No action needed, pain <4.

## 2023-04-15 ENCOUNTER — Other Ambulatory Visit: Payer: Self-pay

## 2023-04-15 ENCOUNTER — Emergency Department (HOSPITAL_BASED_OUTPATIENT_CLINIC_OR_DEPARTMENT_OTHER)
Admission: EM | Admit: 2023-04-15 | Discharge: 2023-04-15 | Disposition: A | Discharge status: Home / Self Care | Payer: Medicare Other

## 2023-04-15 ENCOUNTER — Encounter (HOSPITAL_BASED_OUTPATIENT_CLINIC_OR_DEPARTMENT_OTHER): Payer: Self-pay | Admitting: Emergency Medicine

## 2023-04-15 DIAGNOSIS — Z7982 Long term (current) use of aspirin: Secondary | ICD-10-CM | POA: Diagnosis not present

## 2023-04-15 DIAGNOSIS — R2231 Localized swelling, mass and lump, right upper limb: Secondary | ICD-10-CM | POA: Insufficient documentation

## 2023-04-15 DIAGNOSIS — T148XXA Other injury of unspecified body region, initial encounter: Secondary | ICD-10-CM

## 2023-04-15 DIAGNOSIS — R233 Spontaneous ecchymoses: Secondary | ICD-10-CM | POA: Diagnosis not present

## 2023-04-15 NOTE — ED Notes (Signed)
Discharge paperwork given and verbally understood. 

## 2023-04-15 NOTE — ED Provider Notes (Signed)
[place] EMERGENCY DEPARTMENT AT [facility] Provider Note   CSN: [id number] Arrival date & time: 04/15/23  1239     History  Chief Complaint  Patient presents with   Mass    Kara [name] is a 72 y.o. female.  Presenting for evaluation of right forearm bruise with small lump.  Reports having bruise on arm for the past week or so.  Does not recall how she got it.  Noticed yesterday small lump just adjacent to the bruise.  No numbness tingling changes in sensation in extremity.  No chest pain or shortness of breath.        Home Medications Prior to Admission medications   Medication Sig Start Date End Date Taking? Authorizing Provider  aspirin EC 81 MG tablet Take 1 tablet (81 mg total) by mouth daily. 12/20/18   Kara Croissant, MD  lidocaine (XYLOCAINE) 5 % ointment Apply 1 Application topically as needed. 12/22/22   Kara Olszewski, MD  Na Sulfate-K Sulfate-Mg Sulf 17.5-3.13-1.6 GM/177ML SOLN TAKE AS DIRECTED ONCE FOR 1 DOSE 03/30/23   [provider]  pregabalin (LYRICA) 50 MG capsule Take 1 capsule (50 mg total) by mouth 3 (three) times daily. 04/05/23   Kara Olszewski, MD  ruxolitinib phosphate (JAKAFI) 5 MG tablet Take 1 tablet (5 mg total) by mouth 2 (two) times daily. 01/18/23   Kara Croissant, MD      Allergies    Demerol [meperidine hcl], Penicillins, and Tramadol    Review of Systems   Review of Systems  Physical Exam Updated Vital Signs BP 128/71 (BP Location: Left Arm)   Pulse 95   Temp (!) 97.5 F (36.4 C)   Resp 16   Wt 62.1 kg   SpO2 98%   BMI 26.76 kg/m  Physical Exam Vitals and nursing note reviewed.  Constitutional:      General: She is not in acute distress.    Appearance: She is not toxic-appearing.  HENT:     Head: Normocephalic.     Mouth/Throat:     Mouth: Mucous membranes are moist.  Eyes:     Conjunctiva/sclera: Conjunctivae normal.  Cardiovascular:     Rate and Rhythm: Normal rate and regular rhythm.  Pulmonary:      Effort: Pulmonary effort is normal.  Musculoskeletal:     Comments: Right forearm with 5 cm x 2 cm area of bruising.  Small pea-sized area of induration and fluctuance.  No overlying erythema.  Neurovascular intact in extremity.  No edema in hand or upper extremity  Skin:    Capillary Refill: Capillary refill takes less than 2 seconds.  Neurological:     Mental Status: She is alert and oriented to person, place, and time.  Psychiatric:        Behavior: Behavior normal.     ED Results / Procedures / Treatments   Labs (all labs ordered are listed, but only abnormal results are displayed) Labs Reviewed - No data to display  EKG None  Radiology No results found.  Procedures Procedures    Medications Ordered in ED Medications - No data to display  ED Course/ Medical Decision Making/ A&P                                 Medical Decision Making Is a well-appearing 72 year old female present emergency department with a bruise on her right forearm with a small lump.  Afebrile nontachycardic hemodynamically  stable.  Low suspicion for DVT given appearance and exam.  Suspect small hematoma versus thrombophlebitis.  Discussed watchful waiting and following up with primary doctor.  Patient agreeable to plan.          Final Clinical Impression(s) / ED Diagnoses Final diagnoses:  None    Rx / DC Orders ED Discharge Orders     None         [name], DO 04/15/23 1351

## 2023-04-15 NOTE — Discharge Instructions (Signed)
As discussed please follow-up with your primary doctor.  Return immediately for fevers, chills, worsening swelling, pain, redness to the extremity or any new or worsening symptoms that are concerning to you.

## 2023-04-15 NOTE — ED Triage Notes (Signed)
Pt has bruise on right arm and was gray (has hemochromatosis, she notices a lump at that area and worried about  blood clot.

## 2023-04-17 LAB — SURGICAL PATHOLOGY

## 2023-04-24 ENCOUNTER — Other Ambulatory Visit: Payer: Self-pay | Admitting: *Deleted

## 2023-04-24 ENCOUNTER — Other Ambulatory Visit: Payer: Self-pay

## 2023-04-24 MED ORDER — RUXOLITINIB PHOSPHATE 5 MG PO TABS: 5.0000 mg | ORAL_TABLET | Freq: Two times a day (BID) | ORAL | 3 refills | Status: DC

## 2023-05-01 ENCOUNTER — Inpatient Hospital Stay: Payer: Medicare Other | Attending: Hematology and Oncology

## 2023-05-01 ENCOUNTER — Inpatient Hospital Stay (HOSPITAL_BASED_OUTPATIENT_CLINIC_OR_DEPARTMENT_OTHER): Payer: Medicare Other | Admitting: Hematology and Oncology

## 2023-05-01 VITALS — BP 155/81 | HR 75 | Temp 97.5°F | Resp 18 | Ht 60.0 in | Wt 138.3 lb

## 2023-05-01 DIAGNOSIS — Z7982 Long term (current) use of aspirin: Secondary | ICD-10-CM | POA: Diagnosis not present

## 2023-05-01 DIAGNOSIS — D473 Essential (hemorrhagic) thrombocythemia: Secondary | ICD-10-CM

## 2023-05-01 DIAGNOSIS — D75839 Thrombocytosis, unspecified: Secondary | ICD-10-CM | POA: Insufficient documentation

## 2023-05-01 DIAGNOSIS — B0229 Other postherpetic nervous system involvement: Secondary | ICD-10-CM | POA: Insufficient documentation

## 2023-05-01 DIAGNOSIS — M2560 Stiffness of unspecified joint, not elsewhere classified: Secondary | ICD-10-CM | POA: Diagnosis not present

## 2023-05-01 LAB — CMP (CANCER CENTER ONLY)
ALT: 8 U/L (ref 0–44)
AST: 18 U/L (ref 15–41)
Albumin: 4.9 g/dL (ref 3.5–5.0)
Alkaline Phosphatase: 62 U/L (ref 38–126)
Anion gap: 7 (ref 5–15)
BUN: 15 mg/dL (ref 8–23)
CO2: 27 mmol/L (ref 22–32)
Calcium: 9.6 mg/dL (ref 8.9–10.3)
Chloride: 105 mmol/L (ref 98–111)
Creatinine: 0.63 mg/dL (ref 0.44–1.00)
GFR, Estimated: >60 mL/min (ref >60)
Glucose, Bld: 90 mg/dL (ref 70–99)
Potassium: 4.2 mmol/L (ref 3.5–5.1)
Sodium: 139 mmol/L (ref 135–145)
Total Bilirubin: 0.4 mg/dL (ref 0.0–1.2)
Total Protein: 7.4 g/dL (ref 6.5–8.1)

## 2023-05-01 LAB — CBC WITH DIFFERENTIAL (CANCER CENTER ONLY)
Abs Immature Granulocytes: 0.05 10*3/uL (ref 0.00–0.07)
Basophils Absolute: 0.1 10*3/uL (ref 0.0–0.1)
Basophils Relative: 1 %
Eosinophils Absolute: 0.3 10*3/uL (ref 0.0–0.5)
Eosinophils Relative: 4 %
HCT: 37.1 % (ref 36.0–46.0)
Hemoglobin: 12.1 g/dL (ref 12.0–15.0)
Immature Granulocytes: 1 %
Lymphocytes Relative: 32 %
Lymphs Abs: 2.9 10*3/uL (ref 0.7–4.0)
MCH: 31.7 pg (ref 26.0–34.0)
MCHC: 32.6 g/dL (ref 30.0–36.0)
MCV: 97.1 fL (ref 80.0–100.0)
Monocytes Absolute: 0.6 10*3/uL (ref 0.1–1.0)
Monocytes Relative: 7 %
Neutro Abs: 5.1 10*3/uL (ref 1.7–7.7)
Neutrophils Relative %: 55 %
Platelet Count: 755 10*3/uL — ABNORMAL HIGH (ref 150–400)
RBC: 3.82 MIL/uL — ABNORMAL LOW (ref 3.87–5.11)
RDW: 16.1 % — ABNORMAL HIGH (ref 11.5–15.5)
WBC Count: 9 10*3/uL (ref 4.0–10.5)
nRBC: 0 % (ref 0.0–0.2)

## 2023-05-01 LAB — IRON AND IRON BINDING CAPACITY (CC-WL,HP ONLY)
Iron: 168 ug/dL (ref 28–170)
Saturation Ratios: 53 % — ABNORMAL HIGH (ref 10.4–31.8)
TIBC: 316 ug/dL (ref 250–450)
UIBC: 148 ug/dL (ref 148–442)

## 2023-05-01 LAB — FERRITIN: Ferritin: 21 ng/mL (ref 11–307)

## 2023-05-01 NOTE — Progress Notes (Signed)
 Patient Care Team: Jesus Bernardino MATSU, MD as PCP - General (Internal Medicine) Odean Potts, MD as Consulting Physician (Hematology and Oncology) Bensimhon, Toribio SAUNDERS, MD as Consulting Physician (Cardiology)  DIAGNOSIS:  Encounter Diagnoses  Name Primary?   Essential thrombocytosis (HCC) Yes   Hereditary hemochromatosis (HCC)     CHIEF COMPLIANT: Follow-up of essential thrombocytosis and hemochromatosis  HISTORY OF PRESENT ILLNESS:  History of Present Illness   The patient, with a history of a ET and Hemochromatosis presents with stable platelet counts around 750. She is currently on low-dose aspirin  therapy to prevent platelet aggregation. The patient also reports experiencing symptoms of menopause, including joint stiffness, particularly in the ankles and knees, and difficulty controlling weight. She plans to try a new diet regimen with weekly monitoring to address this issue.  The patient also has a history of shingles, which has resulted in chronic nerve damage and persistent discomfort. The patient describes the pain as internal and constant, with numbness particularly noticeable in the face and earlobe. The patient also mentions a white scar from the shingles, which has been increasing in size.  The patient also mentions an upcoming cataract surgery and expresses concern about the potential effects of anesthesia, as she believes it may have triggered her shingles outbreak.         ALLERGIES:  is allergic to demerol [meperidine hcl], penicillins, and tramadol.  MEDICATIONS:  Current Outpatient Medications  Medication Sig Dispense Refill   aspirin  EC 81 MG tablet Take 1 tablet (81 mg total) by mouth daily.     lidocaine  (XYLOCAINE ) 5 % ointment Apply 1 Application topically as needed. 35.44 g 0   Na Sulfate-K Sulfate-Mg Sulf 17.5-3.13-1.6 GM/177ML SOLN TAKE AS DIRECTED ONCE FOR 1 DOSE     pregabalin  (LYRICA ) 50 MG capsule Take 1 capsule (50 mg total) by mouth 3 (three) times  daily. 90 capsule 0   ruxolitinib  phosphate (JAKAFI ) 5 MG tablet Take 1 tablet (5 mg total) by mouth 2 (two) times daily. 60 tablet 3   Current Facility-Administered Medications  Medication Dose Route Frequency Provider Last Rate Last Admin   0.9 %  sodium chloride  infusion  500 mL Intravenous Once Leigh Elspeth SQUIBB, MD        PHYSICAL EXAMINATION: ECOG PERFORMANCE STATUS: 1 - Symptomatic but completely ambulatory  Vitals:   05/01/23 0841  BP: (!) 155/81  Pulse: 75  Resp: 18  Temp: (!) 97.5 F (36.4 C)  SpO2: 98%   Filed Weights   05/01/23 0841  Weight: 138 lb 4.8 oz (62.7 kg)   LABORATORY DATA:  I have reviewed the data as listed    Latest Ref Rng & Units 05/01/2023    8:26 AM 03/30/2023    8:50 AM 02/16/2023    8:42 AM  CMP  Glucose 70 - 99 mg/dL 90  98  92   BUN 8 - 23 mg/dL 15  16  24    Creatinine 0.44 - 1.00 mg/dL 9.36  9.39  9.33   Sodium 135 - 145 mmol/L 139  139  137   Potassium 3.5 - 5.1 mmol/L 4.2  4.4  4.1   Chloride 98 - 111 mmol/L 105  104  103   CO2 22 - 32 mmol/L 27  28  26    Calcium 8.9 - 10.3 mg/dL 9.6  9.7  9.9   Total Protein 6.5 - 8.1 g/dL 7.4  7.6  7.8   Total Bilirubin 0.0 - 1.2 mg/dL 0.4  0.4  0.5  Alkaline Phos 38 - 126 U/L 62  61  64   AST 15 - 41 U/L 18  20  20    ALT 0 - 44 U/L 8  10  12      Lab Results  Component Value Date   WBC 9.0 05/01/2023   HGB 12.1 05/01/2023   HCT 37.1 05/01/2023   MCV 97.1 05/01/2023   PLT 755 (H) 05/01/2023   NEUTROABS 5.1 05/01/2023    ASSESSMENT & PLAN:  Essential thrombocytosis (HCC) Lab: 06/18/21:  Hb 11.2, Platelet 778 07/30/2021: Hemoglobin 12.9, platelets 783, iron saturation 13%, ferritin 7  10/01/2021: Hemoglobin 14.1, platelets 828, ferritin 22 11/02/2021: Hemoglobin 12.9, platelets 435, ferritin 22, iron saturation 54%  04/06/2022: Platelets 763 05/20/2022: Platelets 782, hemoglobin 13.2, iron saturation 64%, ferritin 42, folate 13.3, B12 222 06/09/22: platelets 826 (shingles) 06/21/2022:  Platelets 701, iron saturation 66%, ferritin 31 08/26/2022: Platelets 938, SPEP, KL ratio: Normal, rheumatoid factor: 10: Normal, ANA, SSA SSB: Negative 09/14/2022: Ferritin 30, iron saturation 56%, hemoglobin 13.8, platelets 951 12/30/2022: Hemoglobin 13.1, platelets 594, iron saturation 60%, ferritin 26 02/16/2023: Platelets 793    Current treatment: Since the patient is intolerant to anagrelide  and hydroxyurea , currently on Jakafi  at the low-dose of 5 mg twice a day.  Started 09/18/2022   Jakafi  toxicities: Denies any major adverse effects to Jakafi  Occasional nightmares and difficulty with sleeping Facial numbness  Hemochromatosis: On periodic phlebotomies.   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 No change in symptoms. Discussed the possibility of this being a chronic condition. -Continue current management plan.  Arthritic Symptoms Reports of stiffness in ankles and knees, possibly related to menopause and weight gain. Discussed the benefits of exercise and stretching. -Implement daily stretching routine. -Consider weight management strategies.  Cataract Surgery Upcoming cataract surgery. Discussed potential concerns related to anesthesia. -Continue with planned surgery. -Ensure appropriate anesthesia monitoring during procedure.  Aspirin  Use Reports of bruising, likely related to aspirin  use. Discussed the role of aspirin  in preventing platelet aggregation. -Continue aspirin  as prescribed.  Menopause Reports of joint stiffness, possibly related to decreased estrogen levels. Discussed the natural progression of menopause and its effects on the body. -Continue current management plan.          Orders Placed This Encounter  Procedures   CBC with Differential (Cancer  Center Only)    Standing Status:   Future    Expiration Date:   04/30/2024   Ferritin    Standing Status:   Future    Expiration Date:   04/30/2024   Iron and Iron Binding Capacity ([facility] only)    Standing Status:   Future    Expiration Date:   04/30/2024   The patient has a good understanding of the overall plan. she agrees with it. she will call with any problems that may develop before the next visit here. Total time spent: 30 mins including face to face time and time spent for planning, charting and co-ordination of care   Naomi MARLA Chad, MD 05/01/23

## 2023-05-01 NOTE — Assessment & Plan Note (Signed)
 Lab: 06/18/21:  Hb 11.2, Platelet 778 07/30/2021: Hemoglobin 12.9, platelets 783, iron saturation 13%, ferritin 7  10/01/2021: Hemoglobin 14.1, platelets 828, ferritin 22 11/02/2021: Hemoglobin 12.9, platelets 435, ferritin 22, iron saturation 54%  04/06/2022: Platelets 763 05/20/2022: Platelets 782, hemoglobin 13.2, iron saturation 64%, ferritin 42, folate 13.3, B12 222 06/09/22: platelets 826 (shingles) 06/21/2022: Platelets 701, iron saturation 66%, ferritin 31 08/26/2022: Platelets 938, SPEP, KL ratio: Normal, rheumatoid factor: 10: Normal, ANA, SSA SSB: Negative 09/14/2022: Ferritin 30, iron saturation 56%, hemoglobin 13.8, platelets 951 12/30/2022: Hemoglobin 13.1, platelets 594, iron saturation 60%, ferritin 26 02/16/2023: Platelets 793    Current treatment: Since the patient is intolerant to anagrelide and hydroxyurea, currently on Jakafi at the low-dose of 5 mg twice a day.  Started 09/18/2022   Jakafi toxicities: Denies any major adverse effects to [facility] Occasional nightmares and difficulty with sleeping Facial numbness   Hemochromatosis: On periodic phlebotomies.   Return to clinic in 4 weeks with labs and follow-up

## 2023-05-03 NOTE — Telephone Encounter (Signed)
 Oral Oncology Patient Advocate Encounter  Received fax update that [employer] is reviewing the patient's renewal application.  Paulette Borrow, CPhT-Adv Oncology Pharmacy Patient Advocate [facility] Direct Number: [phone]  Fax: [phone]

## 2023-05-03 NOTE — Telephone Encounter (Signed)
 Oral Oncology Patient Advocate Encounter   Received notification re-enrollment for assistance for Jakafi  through [employer] has been approved. Patient may continue to receive their medication at $0 from this program.    [facility] phone number [phone].   Effective dates: 05/03/23 through 04/17/24  I have spoken to the patient.  Paulette Borrow, CPhT-Adv Oncology Pharmacy Patient Advocate [facility] Direct Number: [phone]  Fax: [phone]

## 2023-05-04 DIAGNOSIS — L814 Other melanin hyperpigmentation: Secondary | ICD-10-CM | POA: Diagnosis not present

## 2023-05-31 ENCOUNTER — Inpatient Hospital Stay: Payer: Medicare Other

## 2023-05-31 ENCOUNTER — Inpatient Hospital Stay: Payer: Medicare Other | Attending: Hematology and Oncology | Admitting: Hematology and Oncology

## 2023-05-31 ENCOUNTER — Other Ambulatory Visit: Payer: Medicare Other

## 2023-05-31 DIAGNOSIS — D75839 Thrombocytosis, unspecified: Secondary | ICD-10-CM | POA: Diagnosis not present

## 2023-05-31 DIAGNOSIS — D473 Essential (hemorrhagic) thrombocythemia: Secondary | ICD-10-CM | POA: Insufficient documentation

## 2023-05-31 LAB — CBC WITH DIFFERENTIAL (CANCER CENTER ONLY)
Abs Immature Granulocytes: 0.03 10*3/uL (ref 0.00–0.07)
Basophils Absolute: 0 10*3/uL (ref 0.0–0.1)
Basophils Relative: 0 %
Eosinophils Absolute: 0.3 10*3/uL (ref 0.0–0.5)
Eosinophils Relative: 3 %
HCT: 39.2 % (ref 36.0–46.0)
Hemoglobin: 13 g/dL (ref 12.0–15.0)
Immature Granulocytes: 0 %
Lymphocytes Relative: 30 %
Lymphs Abs: 2.7 10*3/uL (ref 0.7–4.0)
MCH: 32.7 pg (ref 26.0–34.0)
MCHC: 33.2 g/dL (ref 30.0–36.0)
MCV: 98.5 fL (ref 80.0–100.0)
Monocytes Absolute: 0.6 10*3/uL (ref 0.1–1.0)
Monocytes Relative: 7 %
Neutro Abs: 5.2 10*3/uL (ref 1.7–7.7)
Neutrophils Relative %: 60 %
Platelet Count: 729 10*3/uL — ABNORMAL HIGH (ref 150–400)
RBC: 3.98 MIL/uL (ref 3.87–5.11)
RDW: 14.4 % (ref 11.5–15.5)
WBC Count: 8.8 10*3/uL (ref 4.0–10.5)
nRBC: 0 % (ref 0.0–0.2)

## 2023-05-31 LAB — CMP (CANCER CENTER ONLY)
ALT: 10 U/L (ref 0–44)
AST: 18 U/L (ref 15–41)
Albumin: 4.8 g/dL (ref 3.5–5.0)
Alkaline Phosphatase: 69 U/L (ref 38–126)
Anion gap: 7 (ref 5–15)
BUN: 17 mg/dL (ref 8–23)
CO2: 26 mmol/L (ref 22–32)
Calcium: 9.4 mg/dL (ref 8.9–10.3)
Chloride: 106 mmol/L (ref 98–111)
Creatinine: 0.65 mg/dL (ref 0.44–1.00)
GFR, Estimated: >60 mL/min (ref >60)
Glucose, Bld: 93 mg/dL (ref 70–99)
Potassium: 3.7 mmol/L (ref 3.5–5.1)
Sodium: 139 mmol/L (ref 135–145)
Total Bilirubin: 0.4 mg/dL (ref 0.0–1.2)
Total Protein: 7.4 g/dL (ref 6.5–8.1)

## 2023-05-31 LAB — IRON AND IRON BINDING CAPACITY (CC-WL,HP ONLY)
Iron: 159 ug/dL (ref 28–170)
Saturation Ratios: 49 % — ABNORMAL HIGH (ref 10.4–31.8)
TIBC: 328 ug/dL (ref 250–450)
UIBC: 169 ug/dL (ref 148–442)

## 2023-05-31 LAB — FERRITIN: Ferritin: 18 ng/mL (ref 11–307)

## 2023-05-31 NOTE — Progress Notes (Signed)
Patient Care Team: Lula Olszewski, MD as PCP - General (Internal Medicine) Serena Croissant, MD as Consulting Physician (Hematology and Oncology) Bensimhon, Bevelyn Buckles, MD as Consulting Physician (Cardiology)  DIAGNOSIS:  Encounter Diagnoses  Name Primary?   Hereditary hemochromatosis (HCC) Yes   Essential thrombocytosis (HCC)     CHIEF COMPLIANT: F/U of hemochromatosis and ET  HISTORY OF PRESENT ILLNESS:   History of Present Illness   Kara [name] is a 73 year old female with shingles who presents with ongoing neuropathic pain.  She has been experiencing ongoing neuropathic pain associated with shingles, which fluctuates in intensity. Two weeks ago, the pain was particularly severe, causing facial numbness and crossing the meridian. The pain is persistent and affects her ability to perform tasks such as putting in earrings, although it is currently calmer compared to previous episodes. She suspects she had internal shingles before the external outbreak on February 11th of the previous year.  She has a history of hemochromatosis and Essential thrombocytosis, which complicates her ability to differentiate these symptoms from those of shingles. Initially, her symptoms, including numbness, were attributed to a facelift, but she now believes they were related to shingles.  Her current medications include aspirin and Jakafi. She was previously prescribed pregabalin for neuropathy but did not take it. She monitors her blood pressure at home and has noted weight gain, which she believes may be affecting her blood pressure.         ALLERGIES:  is allergic to demerol [meperidine hcl], penicillins, and tramadol.  MEDICATIONS:  Current Outpatient Medications  Medication Sig Dispense Refill   aspirin EC 81 MG tablet Take 1 tablet (81 mg total) by mouth daily.     ruxolitinib phosphate (JAKAFI) 5 MG tablet Take 1 tablet (5 mg total) by mouth 2 (two) times daily. 60 tablet 3   Current  Facility-Administered Medications  Medication Dose Route Frequency Provider Last Rate Last Admin   0.9 %  sodium chloride infusion  500 mL Intravenous Once Armbruster, Willaim Rayas, MD        PHYSICAL EXAMINATION: ECOG PERFORMANCE STATUS: 1 - Symptomatic but completely ambulatory  Vitals:   05/31/23 0921 05/31/23 0928  BP: (!) 151/76 (!) 143/78  Pulse: 73   Resp: 19   Temp: 98.2 F (36.8 C)   SpO2: 98%    Filed Weights   05/31/23 0921  Weight: 141 lb 12.8 oz (64.3 kg)      LABORATORY DATA:  I have reviewed the data as listed    Latest Ref Rng & Units 05/31/2023    9:08 AM 05/01/2023    8:26 AM 03/30/2023    8:50 AM  CMP  Glucose 70 - 99 mg/dL 93  90  98   BUN 8 - 23 mg/dL 17  15  16    Creatinine 0.44 - 1.00 mg/dL 1.61  0.96  0.45   Sodium 135 - 145 mmol/L 139  139  139   Potassium 3.5 - 5.1 mmol/L 3.7  4.2  4.4   Chloride 98 - 111 mmol/L 106  105  104   CO2 22 - 32 mmol/L 26  27  28    Calcium 8.9 - 10.3 mg/dL 9.4  9.6  9.7   Total Protein 6.5 - 8.1 g/dL 7.4  7.4  7.6   Total Bilirubin 0.0 - 1.2 mg/dL 0.4  0.4  0.4   Alkaline Phos 38 - 126 U/L 69  62  61   AST 15 - 41 U/L 18  18  20   ALT 0 - 44 U/L 10  8  10      Lab Results  Component Value Date   WBC 8.8 05/31/2023   HGB 13.0 05/31/2023   HCT 39.2 05/31/2023   MCV 98.5 05/31/2023   PLT 729 (H) 05/31/2023   NEUTROABS 5.2 05/31/2023    ASSESSMENT & PLAN:  Essential thrombocytosis (HCC) Lab: 06/18/21:  Hb 11.2, Platelet 778 07/30/2021: Hemoglobin 12.9, platelets 783, iron saturation 13%, ferritin 7  10/01/2021: Hemoglobin 14.1, platelets 828, ferritin 22 11/02/2021: Hemoglobin 12.9, platelets 435, ferritin 22, iron saturation 54%  04/06/2022: Platelets 763 05/20/2022: Platelets 782, hemoglobin 13.2, iron saturation 64%, ferritin 42, folate 13.3, B12 222 06/09/22: platelets 826 (shingles) 06/21/2022: Platelets 701, iron saturation 66%, ferritin 31 08/26/2022: Platelets 938, SPEP, KL ratio: Normal, rheumatoid factor: 10:  Normal, ANA, SSA SSB: Negative 09/14/2022: Ferritin 30, iron saturation 56%, hemoglobin 13.8, platelets 951 12/30/2022: Hemoglobin 13.1, platelets 594, iron saturation 60%, ferritin 26 02/16/2023: Platelets 793    Current treatment: Since the patient is intolerant to anagrelide and hydroxyurea, currently on Jakafi at the low-dose of 5 mg twice a day.  Started 09/18/2022   Jakafi toxicities: Denies any major adverse effects to [facility] Occasional nightmares and difficulty with sleeping Facial numbness   Hemochromatosis: On periodic phlebotomies.   Return to clinic in 4 weeks with labs and follow-up    Orders Placed This Encounter  Procedures   CBC with Differential (Cancer Center Only)    Standing Status:   Future    Expiration Date:   05/30/2024   Ferritin    Standing Status:   Future    Expiration Date:   05/30/2024   Iron and Iron Binding Capacity ([facility] only)    Standing Status:   Future    Expiration Date:   05/30/2024   The patient has a good understanding of the overall plan. she agrees with it. she will call with any problems that may develop before the next visit here. Total time spent: 30 mins including face to face time and time spent for planning, charting and co-ordination of care   Tamsen Meek, MD 05/31/23

## 2023-05-31 NOTE — Assessment & Plan Note (Signed)
Lab: 06/18/21:  Hb 11.2, Platelet 778 07/30/2021: Hemoglobin 12.9, platelets 783, iron saturation 13%, ferritin 7  10/01/2021: Hemoglobin 14.1, platelets 828, ferritin 22 11/02/2021: Hemoglobin 12.9, platelets 435, ferritin 22, iron saturation 54%  04/06/2022: Platelets 763 05/20/2022: Platelets 782, hemoglobin 13.2, iron saturation 64%, ferritin 42, folate 13.3, B12 222 06/09/22: platelets 826 (shingles) 06/21/2022: Platelets 701, iron saturation 66%, ferritin 31 08/26/2022: Platelets 938, SPEP, KL ratio: Normal, rheumatoid factor: 10: Normal, ANA, SSA SSB: Negative 09/14/2022: Ferritin 30, iron saturation 56%, hemoglobin 13.8, platelets 951 12/30/2022: Hemoglobin 13.1, platelets 594, iron saturation 60%, ferritin 26 02/16/2023: Platelets 793    Current treatment: Since the patient is intolerant to anagrelide and hydroxyurea, currently on Jakafi at the low-dose of 5 mg twice a day.  Started 09/18/2022   Jakafi toxicities: Denies any major adverse effects to [facility] Occasional nightmares and difficulty with sleeping Facial numbness   Hemochromatosis: On periodic phlebotomies.   Return to clinic in 4 weeks with labs and follow-up

## 2023-06-07 ENCOUNTER — Other Ambulatory Visit: Payer: Self-pay | Admitting: *Deleted

## 2023-06-07 DIAGNOSIS — R002 Palpitations: Secondary | ICD-10-CM

## 2023-06-07 NOTE — Progress Notes (Signed)
Received call from pt with complaint of heart palpations and concerns for heart failure.  Pt states her sister died suddenly from heart failure and she is concerned. Pt states she has seen Dr. Gala Romney in the past.  Per MD pt needing to f/u with Dr. Gala Romney for further evaluation and tx. Pt educated and verbalized understanding.

## 2023-06-28 ENCOUNTER — Inpatient Hospital Stay (HOSPITAL_BASED_OUTPATIENT_CLINIC_OR_DEPARTMENT_OTHER): Payer: Medicare Other | Admitting: Hematology and Oncology

## 2023-06-28 ENCOUNTER — Inpatient Hospital Stay: Payer: Medicare Other | Attending: Hematology and Oncology

## 2023-06-28 VITALS — BP 145/82 | HR 70 | Temp 98.2°F | Resp 18 | Ht 61.0 in | Wt 144.0 lb

## 2023-06-28 DIAGNOSIS — B029 Zoster without complications: Secondary | ICD-10-CM | POA: Diagnosis not present

## 2023-06-28 DIAGNOSIS — D75839 Thrombocytosis, unspecified: Secondary | ICD-10-CM | POA: Insufficient documentation

## 2023-06-28 DIAGNOSIS — D473 Essential (hemorrhagic) thrombocythemia: Secondary | ICD-10-CM | POA: Insufficient documentation

## 2023-06-28 LAB — CBC WITH DIFFERENTIAL (CANCER CENTER ONLY)
Abs Immature Granulocytes: 0.02 10*3/uL (ref 0.00–0.07)
Basophils Absolute: 0 10*3/uL (ref 0.0–0.1)
Basophils Relative: 0 %
Eosinophils Absolute: 0.2 10*3/uL (ref 0.0–0.5)
Eosinophils Relative: 3 %
HCT: 38.6 % (ref 36.0–46.0)
Hemoglobin: 12.8 g/dL (ref 12.0–15.0)
Immature Granulocytes: 0 %
Lymphocytes Relative: 31 %
Lymphs Abs: 2.3 10*3/uL (ref 0.7–4.0)
MCH: 32 pg (ref 26.0–34.0)
MCHC: 33.2 g/dL (ref 30.0–36.0)
MCV: 96.5 fL (ref 80.0–100.0)
Monocytes Absolute: 0.5 10*3/uL (ref 0.1–1.0)
Monocytes Relative: 7 %
Neutro Abs: 4.3 10*3/uL (ref 1.7–7.7)
Neutrophils Relative %: 59 %
Platelet Count: 668 10*3/uL — ABNORMAL HIGH (ref 150–400)
RBC: 4 MIL/uL (ref 3.87–5.11)
RDW: 13.7 % (ref 11.5–15.5)
WBC Count: 7.4 10*3/uL (ref 4.0–10.5)
nRBC: 0 % (ref 0.0–0.2)

## 2023-06-28 LAB — CMP (CANCER CENTER ONLY)
ALT: 9 U/L (ref 0–44)
AST: 18 U/L (ref 15–41)
Albumin: 4.7 g/dL (ref 3.5–5.0)
Alkaline Phosphatase: 59 U/L (ref 38–126)
Anion gap: 7 (ref 5–15)
BUN: 17 mg/dL (ref 8–23)
CO2: 27 mmol/L (ref 22–32)
Calcium: 9.2 mg/dL (ref 8.9–10.3)
Chloride: 107 mmol/L (ref 98–111)
Creatinine: 0.64 mg/dL (ref 0.44–1.00)
GFR, Estimated: >60 mL/min (ref >60)
Glucose, Bld: 98 mg/dL (ref 70–99)
Potassium: 4.2 mmol/L (ref 3.5–5.1)
Sodium: 141 mmol/L (ref 135–145)
Total Bilirubin: 0.4 mg/dL (ref 0.0–1.2)
Total Protein: 7 g/dL (ref 6.5–8.1)

## 2023-06-28 LAB — IRON AND IRON BINDING CAPACITY (CC-WL,HP ONLY)
Iron: 153 ug/dL (ref 28–170)
Saturation Ratios: 52 % — ABNORMAL HIGH (ref 10.4–31.8)
TIBC: 295 ug/dL (ref 250–450)
UIBC: 142 ug/dL — ABNORMAL LOW (ref 148–442)

## 2023-06-28 LAB — FERRITIN: Ferritin: 19 ng/mL (ref 11–307)

## 2023-06-28 NOTE — Assessment & Plan Note (Signed)
 06/18/21:  Hb 11.2, Platelet 778 07/30/2021: Hemoglobin 12.9, platelets 783, iron saturation 13%, ferritin 7  10/01/2021: Hemoglobin 14.1, platelets 828, ferritin 22 11/02/2021: Hemoglobin 12.9, platelets 435, ferritin 22, iron saturation 54%  04/06/2022: Platelets 763 05/20/2022: Platelets 782, hemoglobin 13.2, iron saturation 64%, ferritin 42, folate 13.3, B12 222 06/09/22: platelets 826 (shingles) 06/21/2022: Platelets 701, iron saturation 66%, ferritin 31 08/26/2022: Platelets 938, SPEP, KL ratio: Normal, rheumatoid factor: 10: Normal, ANA, SSA SSB: Negative 09/14/2022: Ferritin 30, iron saturation 56%, hemoglobin 13.8, platelets 951 12/30/2022: Hemoglobin 13.1, platelets 594, iron saturation 60%, ferritin 26 02/16/2023: Platelets 793    Current treatment: Since the patient is intolerant to anagrelide and hydroxyurea, currently on Jakafi at the low-dose of 5 mg twice a day.  Started 09/18/2022   Jakafi toxicities: Denies any major adverse effects to [facility] Facial numbness   Hemochromatosis: On periodic phlebotomies.   Return to clinic in 4 weeks with labs and follow-up

## 2023-06-28 NOTE — Progress Notes (Signed)
 Patient Care Team: Lula Olszewski, MD as PCP - General (Internal Medicine) Serena Croissant, MD as Consulting Physician (Hematology and Oncology) Bensimhon, Bevelyn Buckles, MD as Consulting Physician (Cardiology)  DIAGNOSIS:  Encounter Diagnoses  Name Primary?   Essential thrombocytosis (HCC) Yes   Hereditary hemochromatosis (HCC)     CHIEF COMPLIANT: Follow-up of essential thrombocytosis and hereditary hemochromatosis  HISTORY OF PRESENT ILLNESS:  History of Present Illness The patient, with a history of hemochromatosis, presents with multiple complaints. The chief complaint is fatigue, which worsens as the day progresses, peaking by late afternoon or early evening. The patient also reports significant weight gain, which she attributes to dietary habits and stress. She expresses a desire to return to the Fast 800 diet, which she found effective in the past.  The patient also reports persistent symptoms of shingles, which appear to be worsening. The shingles symptoms are more active at night and are spreading across the patient's face. The patient is scheduled to see a retinal doctor in six weeks for further evaluation of these symptoms.  The patient also expresses concern about potential heart failure due to symptoms of shortness of breath and weight gain. She reports no swelling in the feet but mentions difficulty with buttons due to what feels like arthritic hands. The patient is considering a referral to a cardiologist for further evaluation.     ALLERGIES:  is allergic to demerol [meperidine hcl], penicillins, and tramadol.  MEDICATIONS:  Current Outpatient Medications  Medication Sig Dispense Refill   aspirin EC 81 MG tablet Take 1 tablet (81 mg total) by mouth daily.     ruxolitinib phosphate (JAKAFI) 5 MG tablet Take 1 tablet (5 mg total) by mouth 2 (two) times daily. 60 tablet 3   Current Facility-Administered Medications  Medication Dose Route Frequency Provider Last Rate  Last Admin   0.9 %  sodium chloride infusion  500 mL Intravenous Once Armbruster, Willaim Rayas, MD        PHYSICAL EXAMINATION: ECOG PERFORMANCE STATUS: 1 - Symptomatic but completely ambulatory  Vitals:   06/28/23 1035  BP: (!) 145/82  Pulse: 70  Resp: 18  Temp: 98.2 F (36.8 C)  SpO2: 98%   Filed Weights   06/28/23 1035  Weight: 144 lb (65.3 kg)    Physical Exam   (exam performed in the presence of a chaperone)  LABORATORY DATA:  I have reviewed the data as listed    Latest Ref Rng & Units 06/28/2023   10:13 AM 05/31/2023    9:08 AM 05/01/2023    8:26 AM  CMP  Glucose 70 - 99 mg/dL 98  93  90   BUN 8 - 23 mg/dL 17  17  15    Creatinine 0.44 - 1.00 mg/dL 5.62  1.30  8.65   Sodium 135 - 145 mmol/L 141  139  139   Potassium 3.5 - 5.1 mmol/L 4.2  3.7  4.2   Chloride 98 - 111 mmol/L 107  106  105   CO2 22 - 32 mmol/L 27  26  27    Calcium 8.9 - 10.3 mg/dL 9.2  9.4  9.6   Total Protein 6.5 - 8.1 g/dL 7.0  7.4  7.4   Total Bilirubin 0.0 - 1.2 mg/dL 0.4  0.4  0.4   Alkaline Phos 38 - 126 U/L 59  69  62   AST 15 - 41 U/L 18  18  18    ALT 0 - 44 U/L 9  10  8     Lab Results  Component Value Date   WBC 7.4 06/28/2023   HGB 12.8 06/28/2023   HCT 38.6 06/28/2023   MCV 96.5 06/28/2023   PLT 668 (H) 06/28/2023   NEUTROABS 4.3 06/28/2023    ASSESSMENT & PLAN:  Hereditary hemochromatosis (HCC) Essential thrombocytosis 06/18/21:  Hb 11.2, Platelet 778 07/30/2021: Hemoglobin 12.9, platelets 783, iron saturation 13%, ferritin 7  10/01/2021: Hemoglobin 14.1, platelets 828, ferritin 22 11/02/2021: Hemoglobin 12.9, platelets 435, ferritin 22, iron saturation 54%  04/06/2022: Platelets 763 05/20/2022: Platelets 782, hemoglobin 13.2, iron saturation 64%, ferritin 42, folate 13.3, B12 222 06/09/22: platelets 826 (shingles) 06/21/2022: Platelets 701, iron saturation 66%, ferritin 31 08/26/2022: Platelets 938, SPEP, KL ratio: Normal, rheumatoid factor: 10: Normal, ANA, SSA SSB:  Negative 09/14/2022: Ferritin 30, iron saturation 56%, hemoglobin 13.8, platelets 951 12/30/2022: Hemoglobin 13.1, platelets 594, iron saturation 60%, ferritin 26 02/16/2023: Platelets 793 06/28/2023: Platelets 668    Current treatment: Since the patient is intolerant to anagrelide and hydroxyurea, currently on Jakafi at the low-dose of 5 mg twice a day.  Started 09/18/2022   Jakafi toxicities: Denies any major adverse effects to [facility] Facial numbness   Hemochromatosis: On periodic phlebotomies.   Return to clinic in 6 weeks with labs and follow-up ------------------------------------- Assessment and Plan Assessment & Plan Essential thrombocytosis Platelet count decreased to 668,000/L. Symptoms of fatigue and weight gain likely related to dietary habits. - Continue Ruxolitinib phosphate (Jakafi) 5 mg oral bid. - Follow the Fast 800 diet for three months. - Move to six-week intervals for lab checks.  Hereditary hemochromatosis Iron saturation below 50%. Awaiting today's test results. - Monitor iron saturation levels and await today's test results.  Shingles Worsening symptoms affecting the face, possibly triggered by previous surgery. Awaiting specialist appointment in six weeks. - Consider referral to an infectious disease specialist if symptoms persist or worsen.  Possible heart failure Symptoms of fatigue, weight gain, and shortness of breath. Absence of typical heart failure symptoms. Discussed potential referral for echocardiogram. - Consider referral to Dr. Augustina Mood for an echocardiogram to assess heart function.      Orders Placed This Encounter  Procedures   CBC with Differential (Cancer Center Only)    Standing Status:   Future    Expiration Date:   06/27/2024   Ferritin    Standing Status:   Future    Expiration Date:   06/27/2024   Iron and Iron Binding Capacity ([facility] only)    Standing Status:   Future    Expiration Date:   06/27/2024   The patient has a  good understanding of the overall plan. she agrees with it. she will call with any problems that may develop before the next visit here. Total time spent: 30 mins including face to face time and time spent for planning, charting and co-ordination of care   Tamsen Meek, MD 06/28/23

## 2023-07-07 ENCOUNTER — Ambulatory Visit: Payer: Medicare Other | Admitting: Internal Medicine

## 2023-08-01 ENCOUNTER — Encounter: Payer: Self-pay | Admitting: Hematology and Oncology

## 2023-08-07 DIAGNOSIS — L718 Other rosacea: Secondary | ICD-10-CM | POA: Diagnosis not present

## 2023-08-09 ENCOUNTER — Encounter: Payer: Self-pay | Admitting: Hematology and Oncology

## 2023-08-09 ENCOUNTER — Ambulatory Visit (INDEPENDENT_AMBULATORY_CARE_PROVIDER_SITE_OTHER): Admitting: Internal Medicine

## 2023-08-09 ENCOUNTER — Encounter: Payer: Self-pay | Admitting: Internal Medicine

## 2023-08-09 VITALS — BP 134/84 | HR 79 | Temp 97.5°F | Wt 137.6 lb

## 2023-08-09 DIAGNOSIS — I700 Atherosclerosis of aorta: Secondary | ICD-10-CM | POA: Diagnosis not present

## 2023-08-09 DIAGNOSIS — B0223 Postherpetic polyneuropathy: Secondary | ICD-10-CM

## 2023-08-09 DIAGNOSIS — B0229 Other postherpetic nervous system involvement: Secondary | ICD-10-CM | POA: Diagnosis not present

## 2023-08-09 DIAGNOSIS — Z860100 Personal history of colon polyps, unspecified: Secondary | ICD-10-CM

## 2023-08-09 DIAGNOSIS — H01003 Unspecified blepharitis right eye, unspecified eyelid: Secondary | ICD-10-CM

## 2023-08-09 MED ORDER — POLYMYXIN B-TRIMETHOPRIM 10000-0.1 UNIT/ML-% OP SOLN: 2.0000 [drp] | OPHTHALMIC | 0 refills | Status: DC

## 2023-08-09 MED ORDER — VALACYCLOVIR HCL 500 MG PO TABS: 500.0000 mg | ORAL_TABLET | Freq: Every day | ORAL | 3 refills | Status: DC

## 2023-08-09 NOTE — Progress Notes (Signed)
 ==============================  [facility]: [phone]   -- Medical Office Visit --  Patient: Kara [name]      Age: 73 y.o.       Sex:  female  Date:   08/09/2023 Today's Healthcare Provider: Anthon Kins, MD  ==============================   Chief Complaint: Medical Management of Chronic Issues (Would like to discuss polytrim  rx for right eye. )  Discussed the use of AI scribe software for clinical note transcription with the patient, who gave verbal consent to proceed.  History of Present Illness She has a history of shingles with postherpetic neuralgia, causing significant discomfort. A recent flare-up included pain and swelling in the earlobe. Topical cortisone is used for relief, and he feels like it is flared up again from last year when it came out and it was really bad on the right side of her chest that left scarring also present in her ear and her right face.  It feels like it started all over again and topical cortisone is helping again She also would like Polytrim  for a flare of inflammation in her lower right eyelid which she is concerned is can get worse before she can see the eye doctor next week  We have never done a cholesterol check since having her here she gets most of her blood work done elsewhere by other specialists we did did to see aortic atherosclerosis on some of the imaging test so we discussed this and possibly starting statin  Lab Results  Component Value Date   HDL 62.30 04/28/2016   CHOLHDL 2 04/28/2016   Lab Results  Component Value Date   LDLCALC 77 04/28/2016   Lab Results  Component Value Date   TRIG 73.0 04/28/2016   Lab Results  Component Value Date   CHOL 154 04/28/2016   The ASCVD Risk score (Arnett DK, et al., 2019) failed to calculate for the following reasons:   Cannot find a previous HDL lab   Cannot find a previous total cholesterol lab Lab Results  Component Value Date   ALT 9  06/28/2023   AST 18 06/28/2023   ALKPHOS 59 06/28/2023   TSH 0.824 12/16/2021   HGBA1C 5.4 04/28/2016    Background Reviewed: Problem List: has Hereditary hemochromatosis (HCC); Essential thrombocytosis (HCC); Nasal obstruction; Facial numbness; Post herpetic neuralgia; History of smoking 25-50 pack years; Night sweats; Intrinsic aging of facial skin; Skin rash; History of colon polyps; Emphysema lung (HCC); Aortic aneurysm, thoracic (HCC); Lung nodules; Aortic atherosclerosis (HCC); and Abnormal mammogram of both breasts on their problem list. Medications:  has a current medication list which includes the following prescription(s): aspirin  ec, azithromycin , ruxolitinib  phosphate, trimethoprim -polymyxin b , and valacyclovir , and the following Facility-Administered Medications: sodium chloride .  Allergies:  is allergic to demerol [meperidine hcl], penicillins, and tramadol.  Past Medical History:  has a past medical history of Allergy (Unknown), Anemia (Unknown), Aneurysm of ascending aorta without rupture (HCC), Aortic atherosclerosis (HCC), Cataract (One month), Clotting disorder (HCC) (Unknown), Essential thrombocytosis (HCC), Hemochromatosis, History of smoking 25-50 pack years, Hypertension (Unknown), Lung nodule, Orthostatic hypotension (05/20/2016), Other emphysema (HCC), and Sleep apnea (Unknown). Past Surgical History:   has a past surgical history that includes Nasal septum surgery; Cosmetic surgery (Mar 07 2022); Breast biopsy (Left, 12/29/2022); Breast biopsy (Right, 12/29/2022); and Breast biopsy (Right, 12/29/2022). Social History:   reports that she quit smoking about 3 years ago. Her smoking use included cigarettes. She started smoking about 33 years ago. She has a  60 pack-year smoking history. She has never used smokeless tobacco. She reports that she does not currently use alcohol. She reports that she does not use drugs. Family History:  family history includes Acute myelogenous leukemia  in her father; Arthritis in her mother; Bipolar disorder in her sister; Murrell Arrant' disease in her sister; Heart attack in her maternal grandfather; Heart disease in her paternal grandfather, paternal grandmother, and sister; Stroke in her maternal grandmother. Depression Screen and Health Maintenance:    04/05/2023    1:58 PM 11/24/2022    8:06 AM 09/26/2022    2:19 PM  PHQ 2/9 Scores  PHQ - 2 Score 0 0 0    Medication Reconciliation: Current Outpatient Medications on File Prior to Visit  Medication Sig   aspirin  EC 81 MG tablet Take 1 tablet (81 mg total) by mouth daily.   azithromycin  (ZITHROMAX ) 250 MG tablet Take 250 mg by mouth.   ruxolitinib  phosphate (JAKAFI ) 5 MG tablet Take 1 tablet (5 mg total) by mouth 2 (two) times daily.   Current Facility-Administered Medications on File Prior to Visit  Medication   0.9 %  sodium chloride  infusion  There are no discontinued medications.      Physical Exam:    08/09/2023    3:13 PM 06/28/2023   10:35 AM 05/31/2023    9:28 AM  Vitals with BMI  Height  5\' 1"    Weight 137 lbs 10 oz 144 lbs   BMI  27.22   Systolic 134 145 161  Diastolic 84 82 78  Pulse 79 70    Wt Readings from Last 10 Encounters:  08/09/23 137 lb 9.6 oz (62.4 kg)  06/28/23 144 lb (65.3 kg)  05/31/23 141 lb 12.8 oz (64.3 kg)  05/01/23 138 lb 4.8 oz (62.7 kg)  04/15/23 137 lb (62.1 kg)  04/10/23 137 lb (62.1 kg)  04/05/23 137 lb (62.1 kg)  03/30/23 137 lb 6.4 oz (62.3 kg)  03/29/23 137 lb (62.1 kg)  02/16/23 134 lb 12.8 oz (61.1 kg)  Vital signs reviewed.  Nursing notes reviewed. Weight trend reviewed. Physical Exam  Physical Exam Inflamed lower right eyelid there is some pale scarring along her right chest wall from the shingles  General Appearance:  No acute distress appreciable.   Well-groomed, healthy-appearing female.  Well proportioned with no abnormal fat distribution.  Good muscle tone. Pulmonary:  Normal work of breathing at rest, no respiratory  distress apparent. SpO2: 99 %  Musculoskeletal: All extremities are intact.  Neurological:  Awake, alert, oriented, and engaged.  No obvious focal neurological deficits or cognitive impairments.  Sensorium seems unclouded.   Speech is clear and coherent with logical content. Psychiatric:  Appropriate mood, pleasant and cooperative demeanor, thoughtful and engaged during the exam    No results found for any visits on 08/09/23. Appointment on 06/28/2023  Component Date Value   Sodium 06/28/2023 141    Potassium 06/28/2023 4.2    Chloride 06/28/2023 107    CO2 06/28/2023 27    Glucose, Bld 06/28/2023 98    BUN 06/28/2023 17    Creatinine 06/28/2023 0.64    Calcium 06/28/2023 9.2    Total Protein 06/28/2023 7.0    Albumin 06/28/2023 4.7    AST 06/28/2023 18    ALT 06/28/2023 9    Alkaline Phosphatase 06/28/2023 59    Total Bilirubin 06/28/2023 0.4    GFR, Estimated 06/28/2023 >60    Anion gap 06/28/2023 7    Iron 06/28/2023 153  TIBC 06/28/2023 295    Saturation Ratios 06/28/2023 52 (H)    UIBC 06/28/2023 142 (L)    Ferritin 06/28/2023 19    WBC Count 06/28/2023 7.4    RBC 06/28/2023 4.00    Hemoglobin 06/28/2023 12.8    HCT 06/28/2023 38.6    MCV 06/28/2023 96.5    MCH 06/28/2023 32.0    MCHC 06/28/2023 33.2    RDW 06/28/2023 13.7    Platelet Count 06/28/2023 668 (H)    nRBC 06/28/2023 0.0    Neutrophils Relative % 06/28/2023 59    Neutro Abs 06/28/2023 4.3    Lymphocytes Relative 06/28/2023 31    Lymphs Abs 06/28/2023 2.3    Monocytes Relative 06/28/2023 7    Monocytes Absolute 06/28/2023 0.5    Eosinophils Relative 06/28/2023 3    Eosinophils Absolute 06/28/2023 0.2    Basophils Relative 06/28/2023 0    Basophils Absolute 06/28/2023 0.0    Immature Granulocytes 06/28/2023 0    Abs Immature Granulocytes 06/28/2023 0.02   Appointment on 05/31/2023  Component Date Value   Sodium 05/31/2023 139    Potassium 05/31/2023 3.7    Chloride 05/31/2023 106    CO2  05/31/2023 26    Glucose, Bld 05/31/2023 93    BUN 05/31/2023 17    Creatinine 05/31/2023 0.65    Calcium 05/31/2023 9.4    Total Protein 05/31/2023 7.4    Albumin 05/31/2023 4.8    AST 05/31/2023 18    ALT 05/31/2023 10    Alkaline Phosphatase 05/31/2023 69    Total Bilirubin 05/31/2023 0.4    GFR, Estimated 05/31/2023 >60    Anion gap 05/31/2023 7    Iron 05/31/2023 159    TIBC 05/31/2023 328    Saturation Ratios 05/31/2023 49 (H)    UIBC 05/31/2023 169    Ferritin 05/31/2023 18    WBC Count 05/31/2023 8.8    RBC 05/31/2023 3.98    Hemoglobin 05/31/2023 13.0    HCT 05/31/2023 39.2    MCV 05/31/2023 98.5    MCH 05/31/2023 32.7    MCHC 05/31/2023 33.2    RDW 05/31/2023 14.4    Platelet Count 05/31/2023 729 (H)    nRBC 05/31/2023 0.0    Neutrophils Relative % 05/31/2023 60    Neutro Abs 05/31/2023 5.2    Lymphocytes Relative 05/31/2023 30    Lymphs Abs 05/31/2023 2.7    Monocytes Relative 05/31/2023 7    Monocytes Absolute 05/31/2023 0.6    Eosinophils Relative 05/31/2023 3    Eosinophils Absolute 05/31/2023 0.3    Basophils Relative 05/31/2023 0    Basophils Absolute 05/31/2023 0.0    Immature Granulocytes 05/31/2023 0    Abs Immature Granulocytes 05/31/2023 0.03   Appointment on 05/01/2023  Component Date Value   Sodium 05/01/2023 139    Potassium 05/01/2023 4.2    Chloride 05/01/2023 105    CO2 05/01/2023 27    Glucose, Bld 05/01/2023 90    BUN 05/01/2023 15    Creatinine 05/01/2023 0.63    Calcium 05/01/2023 9.6    Total Protein 05/01/2023 7.4    Albumin 05/01/2023 4.9    AST 05/01/2023 18    ALT 05/01/2023 8    Alkaline Phosphatase 05/01/2023 62    Total Bilirubin 05/01/2023 0.4    GFR, Estimated 05/01/2023 >60    Anion gap 05/01/2023 7    Iron 05/01/2023 168    TIBC 05/01/2023 316    Saturation Ratios 05/01/2023 53 (H)    UIBC 05/01/2023  148    Ferritin 05/01/2023 21    WBC Count 05/01/2023 9.0    RBC 05/01/2023 3.82 (L)    Hemoglobin 05/01/2023  12.1    HCT 05/01/2023 37.1    MCV 05/01/2023 97.1    MCH 05/01/2023 31.7    MCHC 05/01/2023 32.6    RDW 05/01/2023 16.1 (H)    Platelet Count 05/01/2023 755 (H)    nRBC 05/01/2023 0.0    Neutrophils Relative % 05/01/2023 55    Neutro Abs 05/01/2023 5.1    Lymphocytes Relative 05/01/2023 32    Lymphs Abs 05/01/2023 2.9    Monocytes Relative 05/01/2023 7    Monocytes Absolute 05/01/2023 0.6    Eosinophils Relative 05/01/2023 4    Eosinophils Absolute 05/01/2023 0.3    Basophils Relative 05/01/2023 1    Basophils Absolute 05/01/2023 0.1    Immature Granulocytes 05/01/2023 1    Abs Immature Granulocytes 05/01/2023 0.05   Procedure visit on 04/10/2023  Component Date Value   SURGICAL PATHOLOGY 04/10/2023                     Value:SURGICAL PATHOLOGY [facility] [address] [place], [place] [address] Telephone [phone] or [phone] Fax [phone]  REPORT OF SURGICAL PATHOLOGY   Accession #: [id number] Patient Name: ARACELIS, ULREY Visit # : [id number]  MRN: [id number] Physician: Alvester Johnson DOB/Age [birth year] (Age: 88) Gender: F Collected Date: 04/10/2023 Received Date: 04/13/2023  FINAL DIAGNOSIS       1. 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 Surgical [P], Colon, Cecum, Polyp (1)                          2. Surgical [P], Colon, Transverse, Sigmoid, Rectal, Polyp (5)  SPECIMEN COMMENTS: 1. Positive colorectal cancer screening using cologuard test; benign neoplasm  of cecum; benign neoplasm of transverse colon; benign neoplasm of sigmoid colon; benign neoplasm of rectum and anal canal SPECIMEN CLINICAL INFORMATION: 1. R/O adenoma 2. R/O adenoma    Gross Description 1. Received in formalin are tan, soft tissue fragments that are submitted in toto.Number: 1 Size: 1.1 cm, (1B) ( TA ) 2. Received in formalin are tan, soft tissue fragments that are submitted in toto.Only 4 pol in bottle Number: 4, Size: 0.3 cm smallest to 0.9 cm largest, (1B) ( TA )        Report signed out from the following location(s) [place]. [facility] [address] [place], [place] [address] CLIA #: [id number]  [facility] [address] [place], [place] [address] CLIA #: [id number]   Appointment on 03/30/2023  Component Date Value   Sodium 03/30/2023 139    Potassium 03/30/2023 4.4    Chloride 03/30/2023 104    CO2 03/30/2023 28    Glucose, Bld 03/30/2023 98    BUN 03/30/2023 16    Creatinine 03/30/2023 0.60    Calcium 03/30/2023  9.7    Total Protein 03/30/2023 7.6    Albumin 03/30/2023 5.0    AST 03/30/2023 20    ALT 03/30/2023 10    Alkaline Phosphatase 03/30/2023 61    Total Bilirubin 03/30/2023 0.4    GFR, Estimated 03/30/2023 >60    Anion gap 03/30/2023 7    Iron 03/30/2023 174 (H)    TIBC 03/30/2023 274    Saturation Ratios 03/30/2023 63 (H)    UIBC 03/30/2023 100 (L)    Ferritin 03/30/2023 45    WBC Count 03/30/2023 9.5    RBC 03/30/2023 4.08    Hemoglobin 03/30/2023 12.9    HCT 03/30/2023 39.0    MCV 03/30/2023 95.6    MCH 03/30/2023 31.6    MCHC 03/30/2023 33.1    RDW 03/30/2023 14.8    Platelet Count 03/30/2023 751 (H)    nRBC 03/30/2023 0.0    Neutrophils Relative % 03/30/2023 62    Neutro Abs 03/30/2023 5.9    Lymphocytes Relative 03/30/2023 27    Lymphs Abs 03/30/2023 2.5    Monocytes Relative 03/30/2023 8    Monocytes Absolute 03/30/2023 0.7    Eosinophils Relative 03/30/2023 3    Eosinophils  Absolute 03/30/2023 0.3    Basophils Relative 03/30/2023 0    Basophils Absolute 03/30/2023 0.0    Immature Granulocytes 03/30/2023 0    Abs Immature Granulocytes 03/30/2023 0.02   Appointment on 02/16/2023  Component Date Value   Sodium 02/16/2023 137    Potassium 02/16/2023 4.1    Chloride 02/16/2023 103    CO2 02/16/2023 26    Glucose, Bld 02/16/2023 92    BUN 02/16/2023 24 (H)    Creatinine 02/16/2023 0.66    Calcium 02/16/2023 9.9    Total Protein 02/16/2023 7.8    Albumin 02/16/2023 5.1 (H)    AST 02/16/2023 20    ALT 02/16/2023 12    Alkaline Phosphatase 02/16/2023 64    Total Bilirubin 02/16/2023 0.5    GFR, Estimated 02/16/2023 >60    Anion gap 02/16/2023 8    Iron 02/16/2023 221 (H)    TIBC 02/16/2023 308    Saturation Ratios 02/16/2023 72 (H)    UIBC 02/16/2023 87 (L)    Ferritin 02/16/2023 56    WBC Count 02/16/2023 10.1    RBC 02/16/2023 4.31    Hemoglobin 02/16/2023 13.3    HCT 02/16/2023 40.5    MCV 02/16/2023 94.0    MCH 02/16/2023 30.9    MCHC 02/16/2023 32.8    RDW 02/16/2023 14.3    Platelet Count 02/16/2023 793 (H)    nRBC 02/16/2023 0.0    Neutrophils Relative % 02/16/2023 59    Neutro Abs 02/16/2023 6.0    Lymphocytes Relative 02/16/2023 30    Lymphs Abs 02/16/2023 3.1    Monocytes Relative 02/16/2023 7    Monocytes Absolute 02/16/2023 0.7    Eosinophils Relative 02/16/2023 3    Eosinophils Absolute 02/16/2023 0.3    Basophils Relative 02/16/2023 1    Basophils Absolute 02/16/2023 0.1    Immature Granulocytes 02/16/2023 0    Abs Immature Granulocytes 02/16/2023 0.04   Appointment on 01/16/2023  Component Date Value   Sodium 01/16/2023 137    Potassium 01/16/2023 4.2    Chloride 01/16/2023 105    CO2 01/16/2023 24    Glucose, Bld 01/16/2023 93    BUN 01/16/2023 9    Creatinine 01/16/2023 0.64    Calcium 01/16/2023 9.6    Total Protein 01/16/2023 7.2  Albumin 01/16/2023 4.9    AST 01/16/2023 21    ALT 01/16/2023 11    Alkaline  Phosphatase 01/16/2023 47    Total Bilirubin 01/16/2023 0.4    GFR, Estimated 01/16/2023 >60    Anion gap 01/16/2023 8    Iron 01/16/2023 164    TIBC 01/16/2023 274    Saturation Ratios 01/16/2023 60 (H)    UIBC 01/16/2023 110 (L)    Ferritin 01/16/2023 26    WBC Count 01/16/2023 7.5    RBC 01/16/2023 4.25    Hemoglobin 01/16/2023 13.1    HCT 01/16/2023 39.7    MCV 01/16/2023 93.4    MCH 01/16/2023 30.8    MCHC 01/16/2023 33.0    RDW 01/16/2023 14.3    Platelet Count 01/16/2023 594 (H)    nRBC 01/16/2023 0.0    Neutrophils Relative % 01/16/2023 62    Neutro Abs 01/16/2023 4.7    Lymphocytes Relative 01/16/2023 28    Lymphs Abs 01/16/2023 2.1    Monocytes Relative 01/16/2023 7    Monocytes Absolute 01/16/2023 0.6    Eosinophils Relative 01/16/2023 3    Eosinophils Absolute 01/16/2023 0.2    Basophils Relative 01/16/2023 0    Basophils Absolute 01/16/2023 0.0    Immature Granulocytes 01/16/2023 0    Abs Immature Granulocytes 01/16/2023 0.03   Hospital Outpatient Visit on 12/29/2022  Component Date Value   SURGICAL PATHOLOGY 12/29/2022                     Value:SURGICAL PATHOLOGY [facility] [address] [place], [place] [address] Telephone [phone] or [phone] Fax [phone]  REPORT OF SURGICAL PATHOLOGY   Accession #: [id number] Patient Name: FEIGA, NADEL Visit # : [id number]  MRN: [id number] Physician: Allena Ito DOB/Age 20-Jan-1951 (Age: 39) Gender: F Collected Date: 12/29/2022 Received Date: 12/29/2022  FINAL DIAGNOSIS       1. Breast, right, needle core biopsy, mass, 12:00 :       BENIGN BREAST TISSUE WITH STROMAL FIBROSIS.      NEGATIVE FOR MALIGNANCY.      SEE NOTE.       2. Breast, right, needle core biopsy, 2:00, mass :       BENIGN BREAST TISSUE WITH STROMAL FIBROSIS.      NEGATIVE FOR MALIGNANCY.      SEE NOTE.       3. Breast, left, needle core biopsy, 1:00, mass :       BENIGN BREAST TISSUE  WITH STROMAL FIBROSIS.      NEGATIVE FOR MALIGNANCY.      SEE NOTE.       Diagnosis Note : - 3.[facility] was notified on 12/30/2022.                               ELECTRONIC SIGNATURE : Earleen Glazier, John, Pathologist, Electronic Signature  MICROSCOPIC DESCRIPTION  CASE COMMENTS STAINS USED IN DIAGNOSIS: H&E-2 H&E-3 H&E-4 H&E H&E-2 H&E-3 H&E-4 H&E H&E-2 H&E-3 H&E-4 H&E    CLINICAL HISTORY  SPECIMEN(S) OBTAINED 1. Breast, right, needle core biopsy, Mass, 12:00 2. Breast, right, needle core biopsy, 2:00, Mass 3. Breast, left, needle core biopsy, 1:00, Mass  SPECIMEN COMMENTS: 1. In formalin: 1:30, CIT < 1 min, ribbon clip 2. In formalin: 1:45, CIT < 1 min, coil clip 3. In formalin: 2:15, CIT < 1 min, ribbon clip SPECIMEN CLINICAL INFORMATION: 1. Multiple  bilateral similar appearing oval masses likely FA R/O atypia or malignancy    Gross Description 1. Received in formalin labeled with the patient's name and "right breast 12 o'clock, 1 cm from nipple" are four fragments to cores of fibroadipose tissue ranging from 0.5 to 1.0 cm in length, each measuring 0.1 cm in diameter.The specimen is entirely submitte                         d in one block.      TIF 1:30 p.m. CIT less than 1 min. 2. Received in formalin labeled with the patient's name and "right breast 2 o'clock, 4 cm from nipple" are four cores of fibroadipose tissue ranging from 0.7 to 1.1 cm in length, each measuring 0.1 cm in diameter.The specimen is entirely submitted in one block.      TIF 1:45 p.m. CIT less than 1 min. 3. Received in formalin labeled with the patient's name and "left breast 1 o'clock, 6 cm from nipple" are five fragments to cores of fibroadipose tissue ranging from 0.4 to 1.3 cm in length, each measuring 0.1 cm in diameter.The specimen is entirely submitted in one block.      TIF 2:15 p.m. CIT less than 1 min. (KW:gt, 12/30/22)        Report signed out  from the following location(s) [place]. [facility] [address], [place] [address] CLIA #: [id number]  [facility] [address] [place], [place] [address] CLIA #: [id number]   Appointment on 12/16/2022  Component Date Value   WBC Count 12/16/2022 8.1    RBC 12/16/2022 3.79 (L)    Hemoglobin 12/16/2022 11.8 (L)    HCT 12/16/2022 35.8 (L)    MCV 12/16/2022 94.5    MCH 12/16/2022 31.1    MCHC 12/16/2022 33.0    RDW 12/16/2022 15.0    Platelet Count 12/16/2022 725 (H)    nRBC 12/16/2022 0.0    Neutrophils Relative % 12/16/2022 61    Neutro Abs 12/16/2022 4.9    Lymphocytes Relative 12/16/2022 27    Lymphs Abs 12/16/2022 2.2    Monocytes Relative 12/16/2022 8    Monocytes Absolute 12/16/2022 0.7    Eosinophils Relative 12/16/2022 3    Eosinophils Absolute 12/16/2022 0.3    Basophils Relative 12/16/2022 1    Basophils Absolute 12/16/2022 0.0    Immature Granulocytes 12/16/2022 0    Abs Immature Granulocytes 12/16/2022 0.03    Ferritin 12/16/2022 12    Iron 12/16/2022 128    TIBC 12/16/2022 330    Saturation Ratios 12/16/2022 39 (H)    UIBC 12/16/2022 202    Sodium 12/16/2022 138    Potassium 12/16/2022 4.1    Chloride 12/16/2022 108    CO2 12/16/2022 22    Glucose, Bld 12/16/2022 103 (H)    BUN 12/16/2022 17    Creatinine 12/16/2022 0.67    Calcium 12/16/2022 9.3    Total Protein 12/16/2022 6.9    Albumin 12/16/2022 4.5    AST 12/16/2022 16    ALT 12/16/2022 9    Alkaline Phosphatase 12/16/2022 64    Total Bilirubin 12/16/2022 0.3    GFR, Estimated 12/16/2022 >60    Anion gap 12/16/2022 8   Appointment on 11/18/2022  Component Date Value   Sodium 11/18/2022 137    Potassium 11/18/2022 4.1    Chloride 11/18/2022 105    CO2 11/18/2022 23    Glucose, Bld 11/18/2022 88  BUN 11/18/2022 17    Creatinine 11/18/2022 0.66    Calcium 11/18/2022 9.6    Total Protein 11/18/2022 7.2    Albumin 11/18/2022 4.9    AST 11/18/2022 19     ALT 11/18/2022 9    Alkaline Phosphatase 11/18/2022 56    Total Bilirubin 11/18/2022 0.5    GFR, Estimated 11/18/2022 >60    Anion gap 11/18/2022 9    Iron 11/18/2022 177 (H)    TIBC 11/18/2022 265    Saturation Ratios 11/18/2022 67 (H)    UIBC 11/18/2022 88 (L)    Ferritin 11/18/2022 43    WBC Count 11/18/2022 9.3    RBC 11/18/2022 4.27    Hemoglobin 11/18/2022 13.3    HCT 11/18/2022 39.0    MCV 11/18/2022 91.3    MCH 11/18/2022 31.1    MCHC 11/18/2022 34.1    RDW 11/18/2022 14.5    Platelet Count 11/18/2022 739 (H)    nRBC 11/18/2022 0.0    Neutrophils Relative % 11/18/2022 61    Neutro Abs 11/18/2022 5.7    Lymphocytes Relative 11/18/2022 29    Lymphs Abs 11/18/2022 2.7    Monocytes Relative 11/18/2022 7    Monocytes Absolute 11/18/2022 0.6    Eosinophils Relative 11/18/2022 3    Eosinophils Absolute 11/18/2022 0.2    Basophils Relative 11/18/2022 0    Basophils Absolute 11/18/2022 0.0    Immature Granulocytes 11/18/2022 0    Abs Immature Granulocytes 11/18/2022 0.03   There may be more visits with results that are not included.  No image results found. No results found.    Results      Assessment & Plan Blepharitis of eyelid of right eye, unspecified eyelid, unspecified type Prescribed polytrim  Post herpetic neuralgia Postherpetic neuralgia  Postherpetic neuralgia presents with a recent flare-up of shingles symptoms. Internal shingles affect energy levels and cause discomfort. Valacyclovir  is prescribed for long-term suppression of shingles outbreaks. Start valacyclovir  500 mg daily to prevent further shingles outbreaks. Consider referral to neurology for further management if symptoms persist. Use topical cortisone for symptomatic relief of shingles flare-ups.  The pain is discomfort is significant at times but she declines any medication pills for relief Shingles (herpes zoster) polyneuropathy Postherpetic neuralgia  Postherpetic neuralgia presents with a  recent flare-up of shingles symptoms. Internal shingles affect energy levels and cause discomfort. Valacyclovir  is prescribed for long-term suppression of shingles outbreaks. Start valacyclovir  500 mg daily to prevent further shingles outbreaks. Consider referral to neurology for further management if symptoms persist. Use topical cortisone for symptomatic relief of shingles flare-ups.  Aortic atherosclerosis (HCC) Discussed statin we will wait to see repeat fasting lipid profile.       Orders Placed During this Encounter:   Orders Placed This Encounter  Procedures   Lipid panel    Standing Status:   Future    Expiration Date:   08/08/2024   Ambulatory referral to Neurology    Referral Priority:   Routine    Referral Type:   Consultation    Referral Reason:   Specialty Services Required    Requested Specialty:   Neurology    Number of Visits Requested:   1   Meds ordered this encounter  Medications   trimethoprim -polymyxin b  (POLYTRIM ) ophthalmic solution    Sig: Place 2 drops into both eyes every 4 (four) hours.    Dispense:  10 mL    Refill:  0   valACYclovir  (VALTREX ) 500 MG tablet    Sig: Take 1 tablet (  500 mg total) by mouth daily.    Dispense:  90 tablet    Refill:  3           Medical Decision Making: 2 or more stable chronic illnesses Prescription drug management     Additional Info: This encounter employed state-of-the-art, real-time, collaborative documentation. The patient actively reviewed and assisted in updating their electronic medical record on a shared screen, ensuring transparency and facilitating joint problem-solving for the problem list, overview, and plan. This approach promotes accurate, informed care. The treatment plan was discussed and reviewed in detail, including medication safety, potential side effects, and all patient questions. We confirmed understanding and comfort with the plan. Follow-up instructions were established, including contacting the  office for any concerns, returning if symptoms worsen, persist, or new symptoms develop, and precautions for potential emergency department visits.   This document was synthesized by artificial intelligence ([employer]bridge) using HIPAA-compliant recording of the clinical interaction;   We discussed the use of AI scribe software for clinical note transcription with the patient, who gave verbal consent to proceed.

## 2023-08-09 NOTE — Assessment & Plan Note (Signed)
 Discussed statin we will wait to see repeat fasting lipid profile.

## 2023-08-09 NOTE — Assessment & Plan Note (Signed)
 Postherpetic neuralgia  Postherpetic neuralgia presents with a recent flare-up of shingles symptoms. Internal shingles affect energy levels and cause discomfort. Valacyclovir  is prescribed for long-term suppression of shingles outbreaks. Start valacyclovir  500 mg daily to prevent further shingles outbreaks. Consider referral to neurology for further management if symptoms persist. Use topical cortisone for symptomatic relief of shingles flare-ups.  The pain is discomfort is significant at times but she declines any medication pills for relief

## 2023-08-10 ENCOUNTER — Inpatient Hospital Stay (HOSPITAL_BASED_OUTPATIENT_CLINIC_OR_DEPARTMENT_OTHER): Admitting: Hematology and Oncology

## 2023-08-10 ENCOUNTER — Encounter: Payer: Self-pay | Admitting: Neurology

## 2023-08-10 ENCOUNTER — Inpatient Hospital Stay: Attending: Hematology and Oncology

## 2023-08-10 VITALS — BP 124/85 | HR 67 | Temp 97.6°F | Resp 18 | Ht 61.0 in | Wt 136.2 lb

## 2023-08-10 DIAGNOSIS — B029 Zoster without complications: Secondary | ICD-10-CM | POA: Insufficient documentation

## 2023-08-10 DIAGNOSIS — D75839 Thrombocytosis, unspecified: Secondary | ICD-10-CM | POA: Diagnosis not present

## 2023-08-10 DIAGNOSIS — D473 Essential (hemorrhagic) thrombocythemia: Secondary | ICD-10-CM

## 2023-08-10 LAB — IRON AND IRON BINDING CAPACITY (CC-WL,HP ONLY)
Iron: 155 ug/dL (ref 28–170)
Saturation Ratios: 61 % — ABNORMAL HIGH (ref 10.4–31.8)
TIBC: 256 ug/dL (ref 250–450)
UIBC: 101 ug/dL — ABNORMAL LOW (ref 148–442)

## 2023-08-10 LAB — CMP (CANCER CENTER ONLY)
ALT: 13 U/L (ref 0–44)
AST: 24 U/L (ref 15–41)
Albumin: 5 g/dL (ref 3.5–5.0)
Alkaline Phosphatase: 47 U/L (ref 38–126)
Anion gap: 6 (ref 5–15)
BUN: 17 mg/dL (ref 8–23)
CO2: 28 mmol/L (ref 22–32)
Calcium: 9.7 mg/dL (ref 8.9–10.3)
Chloride: 105 mmol/L (ref 98–111)
Creatinine: 0.62 mg/dL (ref 0.44–1.00)
GFR, Estimated: >60 mL/min (ref >60)
Glucose, Bld: 87 mg/dL (ref 70–99)
Potassium: 4.1 mmol/L (ref 3.5–5.1)
Sodium: 139 mmol/L (ref 135–145)
Total Bilirubin: 0.4 mg/dL (ref 0.0–1.2)
Total Protein: 7.3 g/dL (ref 6.5–8.1)

## 2023-08-10 LAB — CBC WITH DIFFERENTIAL (CANCER CENTER ONLY)
Abs Immature Granulocytes: 0.03 10*3/uL (ref 0.00–0.07)
Basophils Absolute: 0 10*3/uL (ref 0.0–0.1)
Basophils Relative: 1 %
Eosinophils Absolute: 0.2 10*3/uL (ref 0.0–0.5)
Eosinophils Relative: 2 %
HCT: 39.5 % (ref 36.0–46.0)
Hemoglobin: 13.5 g/dL (ref 12.0–15.0)
Immature Granulocytes: 0 %
Lymphocytes Relative: 30 %
Lymphs Abs: 2.4 10*3/uL (ref 0.7–4.0)
MCH: 30.8 pg (ref 26.0–34.0)
MCHC: 34.2 g/dL (ref 30.0–36.0)
MCV: 90.2 fL (ref 80.0–100.0)
Monocytes Absolute: 0.6 10*3/uL (ref 0.1–1.0)
Monocytes Relative: 7 %
Neutro Abs: 4.8 10*3/uL (ref 1.7–7.7)
Neutrophils Relative %: 60 %
Platelet Count: 706 10*3/uL — ABNORMAL HIGH (ref 150–400)
RBC: 4.38 MIL/uL (ref 3.87–5.11)
RDW: 13.8 % (ref 11.5–15.5)
WBC Count: 8.1 10*3/uL (ref 4.0–10.5)
nRBC: 0 % (ref 0.0–0.2)

## 2023-08-10 LAB — FERRITIN: Ferritin: 56 ng/mL (ref 11–307)

## 2023-08-10 NOTE — Progress Notes (Signed)
 Patient Care Team: Anthon Kins, MD as PCP - General (Internal Medicine) Cameron Cea, MD as Consulting Physician (Hematology and Oncology) Bensimhon, Rheta Celestine, MD as Consulting Physician (Cardiology)  DIAGNOSIS:  Encounter Diagnosis  Name Primary?   Essential thrombocytosis (HCC) Yes      CHIEF COMPLIANT: Follow-up of essential thrombocytosis and hemochromatosis  HISTORY OF PRESENT ILLNESS:  History of Present Illness The patient, with a history of shingles and weight gain, presents with persistent shingles for the past 14-15 months. The shingles have been waxing and waning, with periods of relief lasting up to three weeks. The patient reports numbness in the earlobe, sweating, and a sensation akin to having a wound. The shingles have also been associated with heart palpitations, forgetfulness, and exhaustion, leading to concerns about potential dementia. The patient also reports a weight gain of 30 pounds, which she attributes to the shingles. She has recently started a diet and has lost 10 pounds in less than a month. The patient is currently on Jakafi  and has been wondering if some of her symptoms could be side effects of the medication.     ALLERGIES:  is allergic to demerol [meperidine hcl], penicillins, and tramadol.  MEDICATIONS:  Current Outpatient Medications  Medication Sig Dispense Refill   aspirin  EC 81 MG tablet Take 1 tablet (81 mg total) by mouth daily.     azithromycin  (ZITHROMAX ) 250 MG tablet Take 250 mg by mouth.     ruxolitinib  phosphate (JAKAFI ) 5 MG tablet Take 1 tablet (5 mg total) by mouth 2 (two) times daily. 60 tablet 3   trimethoprim -polymyxin b  (POLYTRIM ) ophthalmic solution Place 2 drops into both eyes every 4 (four) hours. 10 mL 0   valACYclovir  (VALTREX ) 500 MG tablet Take 1 tablet (500 mg total) by mouth daily. 90 tablet 3   Current Facility-Administered Medications  Medication Dose Route Frequency Provider Last Rate Last Admin   0.9 %   sodium chloride  infusion  500 mL Intravenous Once Armbruster, Lendon Queen, MD        PHYSICAL EXAMINATION: ECOG PERFORMANCE STATUS: 1 - Symptomatic but completely ambulatory  Vitals:   08/10/23 1000  BP: 124/85  Pulse: 67  Resp: 18  Temp: 97.6 F (36.4 C)  SpO2: 96%   Filed Weights   08/10/23 1000  Weight: 136 lb 3.2 oz (61.8 kg)      LABORATORY DATA:  I have reviewed the data as listed    Latest Ref Rng & Units 06/28/2023   10:13 AM 05/31/2023    9:08 AM 05/01/2023    8:26 AM  CMP  Glucose 70 - 99 mg/dL 98  93  90   BUN 8 - 23 mg/dL 17  17  15    Creatinine 0.44 - 1.00 mg/dL 4.09  8.11  9.14   Sodium 135 - 145 mmol/L 141  139  139   Potassium 3.5 - 5.1 mmol/L 4.2  3.7  4.2   Chloride 98 - 111 mmol/L 107  106  105   CO2 22 - 32 mmol/L 27  26  27    Calcium 8.9 - 10.3 mg/dL 9.2  9.4  9.6   Total Protein 6.5 - 8.1 g/dL 7.0  7.4  7.4   Total Bilirubin 0.0 - 1.2 mg/dL 0.4  0.4  0.4   Alkaline Phos 38 - 126 U/L 59  69  62   AST 15 - 41 U/L 18  18  18    ALT 0 - 44 U/L 9  10  8     Lab Results  Component Value Date   WBC 8.1 08/10/2023   HGB 13.5 08/10/2023   HCT 39.5 08/10/2023   MCV 90.2 08/10/2023   PLT 706 (H) 08/10/2023   NEUTROABS 4.8 08/10/2023    ASSESSMENT & PLAN:  Essential thrombocytosis (HCC) 06/18/21:  Hb 11.2, Platelet 778 07/30/2021: Hemoglobin 12.9, platelets 783, iron saturation 13%, ferritin 7  10/01/2021: Hemoglobin 14.1, platelets 828, ferritin 22 11/02/2021: Hemoglobin 12.9, platelets 435, ferritin 22, iron saturation 54%  04/06/2022: Platelets 763 05/20/2022: Platelets 782, hemoglobin 13.2, iron saturation 64%, ferritin 42, folate 13.3, B12 222 06/09/22: platelets 826 (shingles) 06/21/2022: Platelets 701, iron saturation 66%, ferritin 31 08/26/2022: Platelets 938, SPEP, KL ratio: Normal, rheumatoid factor: 10: Normal, ANA, SSA SSB: Negative 09/14/2022: Ferritin 30, iron saturation 56%, hemoglobin 13.8, platelets 951 12/30/2022: Hemoglobin 13.1, platelets  594, iron saturation 60%, ferritin 26 02/16/2023: Platelets 793 06/28/2023: Platelets 668, iron saturation 52%, ferritin 16 08/10/23: Platelets 706    Current treatment: Since the patient is intolerant to anagrelide  and hydroxyurea , currently on Jakafi  at the low-dose of 5 mg twice a day.  Started 09/18/2022   Jakafi  toxicities: Denies any major adverse effects to Jakafi  Facial numbness   Relapse shingles episodes: Waxing and waning symptoms She went on a diet and lost 10 pounds. Hemochromatosis: On periodic phlebotomies.  Does not need a phlebotomy at this time.  Return to clinic in 8 weeks with labs and follow-up ------------------------------------- Assessment and Plan Assessment & Plan Essential thrombocytosis Platelet count decreased to 706, within target range. Missed medication doses may have influenced count. Further reduction possible. - Continue Ruxolitinib  phosphate (Jakafi ) 5 MG oral bid. - Monitor platelet count regularly.  Hereditary hemochromatosis Ferritin at 19, iron saturation 52%, liver function normal. Condition well-managed.  Shingles Persistent for 14-15 months with intermittent exacerbations. Symptoms include numbness, sweating, palpitations, fatigue. Long-term management likely needed. - Continue current management and monitor symptoms. - Consider referral to a neurologist for further evaluation.      No orders of the defined types were placed in this encounter.  The patient has a good understanding of the overall plan. she agrees with it. she will call with any problems that may develop before the next visit here. Total time spent: 30 mins including face to face time and time spent for planning, charting and co-ordination of care   Viinay K Regenia Erck, MD 08/10/23

## 2023-08-10 NOTE — Assessment & Plan Note (Signed)
 06/18/21:  Hb 11.2, Platelet 778 07/30/2021: Hemoglobin 12.9, platelets 783, iron saturation 13%, ferritin 7  10/01/2021: Hemoglobin 14.1, platelets 828, ferritin 22 11/02/2021: Hemoglobin 12.9, platelets 435, ferritin 22, iron saturation 54%  04/06/2022: Platelets 763 05/20/2022: Platelets 782, hemoglobin 13.2, iron saturation 64%, ferritin 42, folate 13.3, B12 222 06/09/22: platelets 826 (shingles) 06/21/2022: Platelets 701, iron saturation 66%, ferritin 31 08/26/2022: Platelets 938, SPEP, KL ratio: Normal, rheumatoid factor: 10: Normal, ANA, SSA SSB: Negative 09/14/2022: Ferritin 30, iron saturation 56%, hemoglobin 13.8, platelets 951 12/30/2022: Hemoglobin 13.1, platelets 594, iron saturation 60%, ferritin 26 02/16/2023: Platelets 793 06/28/2023: Platelets 668 08/10/23:     Current treatment: Since the patient is intolerant to anagrelide  and hydroxyurea , currently on Jakafi  at the low-dose of 5 mg twice a day.  Started 09/18/2022   Jakafi  toxicities: Denies any major adverse effects to Jakafi  Facial numbness   Hemochromatosis: On periodic phlebotomies.   Return to clinic in 6 weeks with labs and follow-up

## 2023-08-11 ENCOUNTER — Telehealth: Payer: Self-pay | Admitting: Internal Medicine

## 2023-08-11 NOTE — Telephone Encounter (Signed)
 Copied from CRM [id number]. Topic: General - Other >> Aug 10, 2023  4:29 PM Jenice Mitts wrote: Reason for CRM: Patient would like a nurse to give her a call so she can give them some information. She did not want to give the information out on the call

## 2023-08-11 NOTE — Telephone Encounter (Signed)
 Patient's call was returned and she want to know if Dr. Boston Byers knew another neurologist office that would get her in before 09/25/23? Please advise.

## 2023-08-14 ENCOUNTER — Telehealth: Payer: Self-pay

## 2023-08-14 NOTE — Telephone Encounter (Signed)
 Pt called and states she would like to speak with MD concerning Altamease Asters and whether or not "this weird feeling I keep getting that we thought was shingles" could be related to the medication. Advised pt this discussion would warrant a phone visit. She is in agreement with this and agreed to 5/15 at 0945 which is Dr Alix Aquas soonest appt. Pt was advised to go to ED if symptoms of numbness and hot flashes worsen or if she notices facial dropping or slurred speech. She is agreeable.

## 2023-08-21 ENCOUNTER — Ambulatory Visit (INDEPENDENT_AMBULATORY_CARE_PROVIDER_SITE_OTHER): Admitting: Neurology

## 2023-08-21 ENCOUNTER — Encounter: Payer: Self-pay | Admitting: Neurology

## 2023-08-21 VITALS — BP 143/84 | HR 75 | Ht 61.0 in | Wt 135.0 lb

## 2023-08-21 DIAGNOSIS — R200 Anesthesia of skin: Secondary | ICD-10-CM | POA: Diagnosis not present

## 2023-08-21 DIAGNOSIS — R202 Paresthesia of skin: Secondary | ICD-10-CM | POA: Diagnosis not present

## 2023-08-21 DIAGNOSIS — R7989 Other specified abnormal findings of blood chemistry: Secondary | ICD-10-CM | POA: Diagnosis not present

## 2023-08-21 NOTE — Progress Notes (Signed)
 [facility] Neurology Division Clinic Note - Initial Visit   Date: 08/21/2023   Kara [name] MRN: [id number] DOB: 09-03-1950   Dear Dr. Boston Byers:  Thank you for your kind referral of Kara [name] for consultation of post herpetic neuralgia. Although her history is well known to you, please allow us  to reiterate it for the purpose of our medical record. The patient was accompanied to the clinic by self.   Kara [name] is a 73 y.o. right-handed female with hereditary hemochromatosis, essential thrombocytosis,  presenting for evaluation of post herpetic neuralgia.   IMPRESSION/PLAN: Right > left face, neck, arm, and chest numbness.  Discussed with patient that her symptoms are not consistent with post herpetic neuralgia, which is restricted to the dermatomal region affected by the rash.  Her symptoms extend beyond any dermatomal distribution and have also started to involve the contralateral side.  Neurological exam is nonfocal with nonphysiologic findings.  - To further evaluate her diffuse paresthesias, MRI brain wwo contrast will be ordered  2.   Low vitamin B12   - Start vitamin B12 1000mcg daily  Further recommendations pending results.   ------------------------------------------------------------- History of present illness: On 05/29/2022, she developed a small rash over the right side of the chest.  She had pain which involved the right arm.  Pain was stinging pain.  She was treated with antiviral medication.  Since this time, she has ongoing sensory changes on the right side of her chest, arm, and face.  She reports that the numbness/tinging and to a lesser degree, some pain.  This involves the right side of her upper chest, neck, and face including the ear lobe.  It also starts to involve the left side of the face.  She has some relief with topical cortisone.  No weakness.  She is concerned that shingles will affect her optic nerve.   Out-side paper records, electronic  medical record, and images have been reviewed where available and summarized as:  Lab Results  Component Value Date   HGBA1C 5.4 04/28/2016   Lab Results  Component Value Date   VITAMINB12 222 05/20/2022   Lab Results  Component Value Date   TSH 0.824 12/16/2021   Lab Results  Component Value Date   ESRSEDRATE 2 04/28/2016    Past Medical History:  Diagnosis Date   Allergy Unknown   Penecillin   Anemia Unknown   Review lab tests from [facility]   Aneurysm of ascending aorta without rupture (HCC)    Aortic atherosclerosis (HCC)    Cataract One month   Very early stages   Clotting disorder (HCC) Unknown   Hemochromatosis & Jax2 Mutation   Essential thrombocytosis (HCC)    Hemochromatosis    Jax's mutation   History of smoking 25-50 pack years    Hypertension Unknown   Sometimes   Lung nodule    Orthostatic hypotension 05/20/2016   Other emphysema (HCC)    Sleep apnea Unknown   Probably due to collapsed septum    Past Surgical History:  Procedure Laterality Date   BREAST BIOPSY Left 12/29/2022   US  LT BREAST BX W LOC DEV 1ST LESION IMG BX SPEC US  GUIDE 12/29/2022 [facility]   BREAST BIOPSY Right 12/29/2022   US  RT BREAST BX W LOC DEV EA ADD LESION IMG BX SPEC US  GUIDE 12/29/2022 [facility]   BREAST BIOPSY Right 12/29/2022   US  RT BREAST BX W LOC DEV 1ST LESION IMG BX SPEC US  GUIDE 12/29/2022 [facility]   COSMETIC SURGERY  Mar 07 2022   Facial   NASAL SEPTUM SURGERY       Medications:  Outpatient Encounter Medications as of 08/21/2023  Medication Sig   aspirin  EC 81 MG tablet Take 1 tablet (81 mg total) by mouth daily.   ruxolitinib  phosphate (JAKAFI ) 5 MG tablet Take 1 tablet (5 mg total) by mouth 2 (two) times daily.   trimethoprim -polymyxin b  (POLYTRIM ) ophthalmic solution Place 2 drops into both eyes every 4 (four) hours.   [DISCONTINUED] azithromycin  (ZITHROMAX ) 250 MG tablet Take 250 mg by mouth. (Patient not taking: Reported on 08/21/2023)    [DISCONTINUED] valACYclovir  (VALTREX ) 500 MG tablet Take 1 tablet (500 mg total) by mouth daily. (Patient not taking: Reported on 08/21/2023)   Facility-Administered Encounter Medications as of 08/21/2023  Medication   0.9 %  sodium chloride  infusion    Allergies:  Allergies  Allergen Reactions   Demerol [Meperidine Hcl] Other (See Comments)    Passed out after surgery   Penicillins Rash   Tramadol Nausea And Vomiting    Family History: Family History  Problem Relation Age of Onset   Arthritis Mother    Acute myelogenous leukemia Father    Bipolar disorder Sister    Murrell Arrant' disease Sister    Heart disease Sister    Stroke Maternal Grandmother    Heart attack Maternal Grandfather    Heart disease Paternal Grandmother    Heart disease Paternal Grandfather    Diabetes Neg Hx    Cancer Neg Hx    Heart failure Neg Hx    Hyperlipidemia Neg Hx    Hypertension Neg Hx     Social History: Social History   Tobacco Use   Smoking status: Former    Current packs/day: 0.00    Average packs/day: 2.0 packs/day for 30.0 years (60.0 ttl pk-yrs)    Types: Cigarettes    Start date: 04/18/1990    Quit date: 04/18/2020    Years since quitting: 3.3   Smokeless tobacco: Never   Tobacco comments:    Quit  Vaping Use   Vaping status: Never Used  Substance Use Topics   Alcohol use: Not Currently    Comment: No alcohol approx 13 yrs   Drug use: No   Social History   Social History Narrative   She is living with mother.    She works as a Associate Professor for [employer].   Highest level education:  Masters   Right-handed.   Caffeine use: 4 cups per day.      Are you right handed or left handed? Right Handed   Are you currently employed ? 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 No    Who lives with you? With mother    What type of home do you live in: 1 story or 2 story? Lives in a one story floor plan         Vital Signs:  BP (!)  143/84   Pulse 75   Ht 5\' 1"  (1.549 m)   Wt 135 lb (61.2 kg)   SpO2 96%   BMI 25.51 kg/m   Neurological Exam: MENTAL STATUS including orientation to time, place, person, recent and remote memory, attention span and concentration, language, and fund of knowledge is normal.  Speech is not dysarthric.  CRANIAL NERVES: II:  No visual field defects.     III-IV-VI: Pupils equal round and reactive to light.  Normal conjugate,  extra-ocular eye movements in all directions of gaze.  No nystagmus.  No ptosis.   V:  Normal facial sensation.    VII:  Normal facial symmetry and movements.   VIII:  Normal hearing and vestibular function.   IX-X:  Normal palatal movement.   XI:  Normal shoulder shrug and head rotation.   XII:  Normal tongue strength and range of motion, no deviation or fasciculation.  MOTOR:  Motor strength is 5/5 throughout.  No atrophy, fasciculations or abnormal movements.  No pronator drift.   MSRs:                                           Right        Left brachioradialis 2+  2+  biceps 2+  2+  triceps 2+  2+  patellar 2+  2+  ankle jerk 2+  2+  Hoffman no  no  plantar response down  down   SENSORY:  Normal and symmetric perception of light touch, pinprick, and temperature.  Vibration is intact at the limbs.  There is vibrational splitting at the forehead and sternum.   COORDINATION/GAIT: Normal finger-to- nose-finger.  Intact rapid alternating movements bilaterally.  Able to rise from a chair without using arms.  Gait narrow based and stable. Tandem and stressed gait intact.     Thank you for allowing me to participate in patient's care.  If I can answer any additional questions, I would be pleased to do so.    Sincerely,    Kaye Mitro K. Lydia Sams, DO

## 2023-08-23 ENCOUNTER — Ambulatory Visit
Admission: RE | Admit: 2023-08-23 | Discharge: 2023-08-23 | Disposition: A | Discharge status: Home / Self Care | Source: Ambulatory Visit | Attending: Neurology | Admitting: Neurology

## 2023-08-23 DIAGNOSIS — M9901 Segmental and somatic dysfunction of cervical region: Secondary | ICD-10-CM | POA: Diagnosis not present

## 2023-08-23 DIAGNOSIS — I6782 Cerebral ischemia: Secondary | ICD-10-CM | POA: Diagnosis not present

## 2023-08-23 DIAGNOSIS — R200 Anesthesia of skin: Secondary | ICD-10-CM

## 2023-08-23 DIAGNOSIS — M5031 Other cervical disc degeneration,  high cervical region: Secondary | ICD-10-CM | POA: Diagnosis not present

## 2023-08-23 MED ORDER — GADOPICLENOL 0.5 MMOL/ML IV SOLN
6.0000 mL | Freq: Once | INTRAVENOUS | Status: AC | PRN
Start: 2023-08-23 — End: 2023-08-23
  Administered 2023-08-23: 6 mL via INTRAVENOUS

## 2023-08-24 DIAGNOSIS — M5031 Other cervical disc degeneration,  high cervical region: Secondary | ICD-10-CM | POA: Diagnosis not present

## 2023-08-24 DIAGNOSIS — M9901 Segmental and somatic dysfunction of cervical region: Secondary | ICD-10-CM | POA: Diagnosis not present

## 2023-08-29 ENCOUNTER — Ambulatory Visit: Payer: Self-pay | Admitting: Neurology

## 2023-08-29 DIAGNOSIS — M9901 Segmental and somatic dysfunction of cervical region: Secondary | ICD-10-CM | POA: Diagnosis not present

## 2023-08-29 DIAGNOSIS — M5031 Other cervical disc degeneration,  high cervical region: Secondary | ICD-10-CM | POA: Diagnosis not present

## 2023-08-30 ENCOUNTER — Telehealth: Payer: Self-pay | Admitting: *Deleted

## 2023-08-30 ENCOUNTER — Other Ambulatory Visit: Payer: Self-pay | Admitting: *Deleted

## 2023-08-30 MED ORDER — RUXOLITINIB PHOSPHATE 5 MG PO TABS: 5.0000 mg | ORAL_TABLET | Freq: Two times a day (BID) | ORAL | 3 refills | Status: DC

## 2023-08-30 NOTE — Assessment & Plan Note (Signed)
 06/18/21:  Hb 11.2, Platelet 778 07/30/2021: Hemoglobin 12.9, platelets 783, iron saturation 13%, ferritin 7  10/01/2021: Hemoglobin 14.1, platelets 828, ferritin 22 11/02/2021: Hemoglobin 12.9, platelets 435, ferritin 22, iron saturation 54%  04/06/2022: Platelets 763 05/20/2022: Platelets 782, hemoglobin 13.2, iron saturation 64%, ferritin 42, folate 13.3, B12 222 06/09/22: platelets 826 (shingles) 06/21/2022: Platelets 701, iron saturation 66%, ferritin 31 08/26/2022: Platelets 938, SPEP, KL ratio: Normal, rheumatoid factor: 10: Normal, ANA, SSA SSB: Negative 09/14/2022: Ferritin 30, iron saturation 56%, hemoglobin 13.8, platelets 951 12/30/2022: Hemoglobin 13.1, platelets 594, iron saturation 60%, ferritin 26 02/16/2023: Platelets 793 06/28/2023: Platelets 668, iron saturation 52%, ferritin 16 08/10/23: Platelets 706    Current treatment: Since the patient is intolerant to anagrelide  and hydroxyurea , currently on Jakafi  at the low-dose of 5 mg twice a day.  Started 09/18/2022   Jakafi  toxicities: Denies any major adverse effects to Jakafi  Facial numbness   Relapse shingles episodes: Waxing and waning symptoms She went on a diet and lost 10 pounds. Hemochromatosis: On periodic phlebotomies.  Does not need a phlebotomy at this time.   Return to clinic in 8 weeks with labs and follow-up

## 2023-08-30 NOTE — Telephone Encounter (Signed)
 error

## 2023-08-31 ENCOUNTER — Inpatient Hospital Stay: Attending: Hematology and Oncology | Admitting: Hematology and Oncology

## 2023-08-31 DIAGNOSIS — M5031 Other cervical disc degeneration,  high cervical region: Secondary | ICD-10-CM | POA: Diagnosis not present

## 2023-08-31 DIAGNOSIS — B0229 Other postherpetic nervous system involvement: Secondary | ICD-10-CM

## 2023-08-31 DIAGNOSIS — D473 Essential (hemorrhagic) thrombocythemia: Secondary | ICD-10-CM | POA: Diagnosis not present

## 2023-08-31 DIAGNOSIS — M9901 Segmental and somatic dysfunction of cervical region: Secondary | ICD-10-CM | POA: Diagnosis not present

## 2023-08-31 NOTE — Progress Notes (Signed)
 HEMATOLOGY-ONCOLOGY TELEPHONE VISIT PROGRESS NOTE  I connected with our patient on 08/31/23 at  9:45 AM EDT by telephone and verified that I am speaking with the correct person using two identifiers.  I discussed the limitations, risks, security and privacy concerns of performing an evaluation and management service by telephone and the availability of in person appointments.  I also discussed with the patient that there may be a patient responsible charge related to this service. The patient expressed understanding and agreed to proceed.   History of Present Illness: F/U on Jakafi   History of Present Illness Kara [name] is a 73 year old female with post-herpetic neuralgia who presents with persistent pain and numbness.  She experiences persistent pain and numbness primarily affecting the back of her skull, face, and earlobe. The pain resembles a 'skullcap', with numbness on the opposite side of her face and earlobe. These symptoms are ongoing, with some nights being particularly severe, affecting her sleep.  An MRI revealed moderate chronic microvascular ischemic changes and possible atherosclerosis. There is confusion regarding the presence of a rash in relation to her post-herpetic pain.  She continues to experience slight chest pain and numbness on her face, but these symptoms have significantly reduced. She feels much better and does not feel the medication's side effects.    REVIEW OF SYSTEMS:   Constitutional: Denies fevers, chills or abnormal weight loss Insomnia Skull pain, numbness on face Chest neuropathic pain All other systems were reviewed with the patient and are negative. Observations/Objective:     Assessment Plan:  Essential thrombocytosis (HCC) 06/18/21:  Hb 11.2, Platelet 778 07/30/2021: Hemoglobin 12.9, platelets 783, iron saturation 13%, ferritin 7  10/01/2021: Hemoglobin 14.1, platelets 828, ferritin 22 05/20/2022: Platelets 782, hemoglobin 13.2, iron saturation 64%,  ferritin 42, folate 13.3, B12 222 06/09/22: platelets 826 (shingles) 06/21/2022: Platelets 701, iron saturation 66%, ferritin 31 08/26/2022: Platelets 938, SPEP, KL ratio: Normal, rheumatoid factor: 10: Normal, ANA, SSA SSB: Negative 09/14/2022: Ferritin 30, iron saturation 56%, hemoglobin 13.8, platelets 951 12/30/2022: Hemoglobin 13.1, platelets 594, iron saturation 60%, ferritin 26 02/16/2023: Platelets 793 06/28/2023: Platelets 668, iron saturation 52%, ferritin 16 08/10/23: Platelets 706    Current treatment: Since the patient is intolerant to anagrelide  and hydroxyurea , currently on Jakafi  at the low-dose of 5 mg twice a day. Started 09/18/2022   Jakafi  toxicities: Denies any major adverse effects to Jakafi  Facial numbness: Saw neurology Dr.Patel: MRI brain: 08/28/2023: No acute findings. Pain on skull and neck: went to chiropractor: Dr.Spell did alignment of skull and pain has resolved. Insomnia: intermittent   Relapse shingles episodes: Waxing and waning symptoms She went on a diet and lost 10 pounds. Hemochromatosis: On periodic phlebotomies. Does not need a phlebotomy at this time. Depression: slight improvement  Return to clinic in 4 weeks with labs and follow-up Assessment & Plan Shingles Chronic postherpetic neuralgia with pain and numbness in the skull, face, and earlobe. Symptoms improved with chiropractic treatment, indicating a musculoskeletal component. MRI showed no neurological issues. Pain not brain-originated. Neurologist's assessment of pain location contradicted by other opinions. Significant symptom improvement with chiropractic adjustments. - Continue chiropractic treatment with Dr. Spell.  I discussed the assessment and treatment plan with the patient. The patient was provided an opportunity to ask questions and all were answered. The patient agreed with the plan and demonstrated an understanding of the instructions. The patient was advised to call back or seek an in-person  evaluation if the symptoms worsen or if the condition fails to improve as  anticipated.   I provided 20 minutes of non-face-to-face time during this encounter.  This includes time for charting and coordination of care   Margert Sheerer, MD

## 2023-09-04 DIAGNOSIS — M9901 Segmental and somatic dysfunction of cervical region: Secondary | ICD-10-CM | POA: Diagnosis not present

## 2023-09-04 DIAGNOSIS — M5031 Other cervical disc degeneration,  high cervical region: Secondary | ICD-10-CM | POA: Diagnosis not present

## 2023-09-06 DIAGNOSIS — M5031 Other cervical disc degeneration,  high cervical region: Secondary | ICD-10-CM | POA: Diagnosis not present

## 2023-09-06 DIAGNOSIS — M9901 Segmental and somatic dysfunction of cervical region: Secondary | ICD-10-CM | POA: Diagnosis not present

## 2023-09-13 DIAGNOSIS — M9901 Segmental and somatic dysfunction of cervical region: Secondary | ICD-10-CM | POA: Diagnosis not present

## 2023-09-13 DIAGNOSIS — M5031 Other cervical disc degeneration,  high cervical region: Secondary | ICD-10-CM | POA: Diagnosis not present

## 2023-09-19 DIAGNOSIS — M9901 Segmental and somatic dysfunction of cervical region: Secondary | ICD-10-CM | POA: Diagnosis not present

## 2023-09-19 DIAGNOSIS — M5031 Other cervical disc degeneration,  high cervical region: Secondary | ICD-10-CM | POA: Diagnosis not present

## 2023-09-23 ENCOUNTER — Other Ambulatory Visit

## 2023-09-25 ENCOUNTER — Ambulatory Visit: Admitting: Neurology

## 2023-09-25 DIAGNOSIS — H0102B Squamous blepharitis left eye, upper and lower eyelids: Secondary | ICD-10-CM | POA: Diagnosis not present

## 2023-09-25 DIAGNOSIS — H04123 Dry eye syndrome of bilateral lacrimal glands: Secondary | ICD-10-CM | POA: Diagnosis not present

## 2023-09-25 DIAGNOSIS — R519 Headache, unspecified: Secondary | ICD-10-CM | POA: Diagnosis not present

## 2023-09-25 DIAGNOSIS — H43811 Vitreous degeneration, right eye: Secondary | ICD-10-CM | POA: Diagnosis not present

## 2023-09-25 DIAGNOSIS — H0102A Squamous blepharitis right eye, upper and lower eyelids: Secondary | ICD-10-CM | POA: Diagnosis not present

## 2023-09-25 DIAGNOSIS — H2513 Age-related nuclear cataract, bilateral: Secondary | ICD-10-CM | POA: Diagnosis not present

## 2023-09-26 DIAGNOSIS — M5031 Other cervical disc degeneration,  high cervical region: Secondary | ICD-10-CM | POA: Diagnosis not present

## 2023-09-26 DIAGNOSIS — M9901 Segmental and somatic dysfunction of cervical region: Secondary | ICD-10-CM | POA: Diagnosis not present

## 2023-10-03 DIAGNOSIS — J310 Chronic rhinitis: Secondary | ICD-10-CM | POA: Diagnosis not present

## 2023-10-03 DIAGNOSIS — J3489 Other specified disorders of nose and nasal sinuses: Secondary | ICD-10-CM | POA: Diagnosis not present

## 2023-10-05 DIAGNOSIS — H01009 Unspecified blepharitis unspecified eye, unspecified eyelid: Secondary | ICD-10-CM | POA: Diagnosis not present

## 2023-10-05 DIAGNOSIS — H04123 Dry eye syndrome of bilateral lacrimal glands: Secondary | ICD-10-CM | POA: Diagnosis not present

## 2023-10-05 DIAGNOSIS — H0288B Meibomian gland dysfunction left eye, upper and lower eyelids: Secondary | ICD-10-CM | POA: Diagnosis not present

## 2023-10-05 DIAGNOSIS — H0288A Meibomian gland dysfunction right eye, upper and lower eyelids: Secondary | ICD-10-CM | POA: Diagnosis not present

## 2023-10-05 DIAGNOSIS — H25813 Combined forms of age-related cataract, bilateral: Secondary | ICD-10-CM | POA: Diagnosis not present

## 2023-10-10 ENCOUNTER — Other Ambulatory Visit: Payer: Self-pay | Admitting: Internal Medicine

## 2023-10-10 ENCOUNTER — Encounter: Payer: Self-pay | Admitting: Internal Medicine

## 2023-10-10 DIAGNOSIS — I7121 Aneurysm of the ascending aorta, without rupture: Secondary | ICD-10-CM

## 2023-10-11 DIAGNOSIS — M5031 Other cervical disc degeneration,  high cervical region: Secondary | ICD-10-CM | POA: Diagnosis not present

## 2023-10-11 DIAGNOSIS — M9901 Segmental and somatic dysfunction of cervical region: Secondary | ICD-10-CM | POA: Diagnosis not present

## 2023-10-12 ENCOUNTER — Inpatient Hospital Stay: Admitting: Hematology and Oncology

## 2023-10-12 ENCOUNTER — Inpatient Hospital Stay: Attending: Hematology and Oncology

## 2023-10-12 VITALS — BP 150/72 | HR 92 | Temp 97.7°F | Resp 17 | Ht 61.0 in | Wt 144.0 lb

## 2023-10-12 DIAGNOSIS — F32A Depression, unspecified: Secondary | ICD-10-CM | POA: Diagnosis not present

## 2023-10-12 DIAGNOSIS — D473 Essential (hemorrhagic) thrombocythemia: Secondary | ICD-10-CM | POA: Insufficient documentation

## 2023-10-12 DIAGNOSIS — D75839 Thrombocytosis, unspecified: Secondary | ICD-10-CM | POA: Diagnosis not present

## 2023-10-12 DIAGNOSIS — D471 Chronic myeloproliferative disease: Secondary | ICD-10-CM | POA: Diagnosis not present

## 2023-10-12 DIAGNOSIS — B0229 Other postherpetic nervous system involvement: Secondary | ICD-10-CM | POA: Diagnosis not present

## 2023-10-12 LAB — FERRITIN: Ferritin: 59 ng/mL (ref 11–307)

## 2023-10-12 LAB — CBC WITH DIFFERENTIAL (CANCER CENTER ONLY)
Abs Immature Granulocytes: 0.04 10*3/uL (ref 0.00–0.07)
Basophils Absolute: 0 10*3/uL (ref 0.0–0.1)
Basophils Relative: 0 %
Eosinophils Absolute: 0.2 10*3/uL (ref 0.0–0.5)
Eosinophils Relative: 2 %
HCT: 35.5 % — ABNORMAL LOW (ref 36.0–46.0)
Hemoglobin: 12.1 g/dL (ref 12.0–15.0)
Immature Granulocytes: 1 %
Lymphocytes Relative: 24 %
Lymphs Abs: 1.9 10*3/uL (ref 0.7–4.0)
MCH: 31.9 pg (ref 26.0–34.0)
MCHC: 34.1 g/dL (ref 30.0–36.0)
MCV: 93.7 fL (ref 80.0–100.0)
Monocytes Absolute: 0.6 10*3/uL (ref 0.1–1.0)
Monocytes Relative: 7 %
Neutro Abs: 5.2 10*3/uL (ref 1.7–7.7)
Neutrophils Relative %: 66 %
Platelet Count: 648 10*3/uL — ABNORMAL HIGH (ref 150–400)
RBC: 3.79 MIL/uL — ABNORMAL LOW (ref 3.87–5.11)
RDW: 15.3 % (ref 11.5–15.5)
WBC Count: 7.9 10*3/uL (ref 4.0–10.5)
nRBC: 0 % (ref 0.0–0.2)

## 2023-10-12 LAB — IRON AND IRON BINDING CAPACITY (CC-WL,HP ONLY)
Iron: 193 ug/dL — ABNORMAL HIGH (ref 28–170)
Saturation Ratios: 67 % — ABNORMAL HIGH (ref 10.4–31.8)
TIBC: 287 ug/dL (ref 250–450)
UIBC: 94 ug/dL — ABNORMAL LOW (ref 148–442)

## 2023-10-12 NOTE — Assessment & Plan Note (Signed)
 06/18/21:  Hb 11.2, Platelet 778 07/30/2021: Hemoglobin 12.9, platelets 783, iron saturation 13%, ferritin 7  10/01/2021: Hemoglobin 14.1, platelets 828, ferritin 22 05/20/2022: Platelets 782, hemoglobin 13.2, iron saturation 64%, ferritin 42, folate 13.3, B12 222 06/09/22: platelets 826 (shingles) 06/21/2022: Platelets 701, iron saturation 66%, ferritin 31 08/26/2022: Platelets 938, SPEP, KL ratio: Normal, rheumatoid factor: 10: Normal, ANA, SSA SSB: Negative 09/14/2022: Ferritin 30, iron saturation 56%, hemoglobin 13.8, platelets 951 12/30/2022: Hemoglobin 13.1, platelets 594, iron saturation 60%, ferritin 26 02/16/2023: Platelets 793 06/28/2023: Platelets 668, iron saturation 52%, ferritin 16 08/10/23: Platelets 706    Current treatment: Since the patient is intolerant to anagrelide  and hydroxyurea , currently on Jakafi  at the low-dose of 5 mg twice a day. Started 09/18/2022   Jakafi  toxicities: Denies any major adverse effects to Jakafi  Facial numbness: Saw neurology Dr.Patel: MRI brain: 08/28/2023: No acute findings. Pain on skull and neck: went to chiropractor: Dr.Spell did alignment of skull and pain has resolved. Insomnia: intermittent   Relapse shingles episodes: Waxing and waning symptoms She went on a diet and lost 10 pounds. Hemochromatosis: On periodic phlebotomies. Does not need a phlebotomy at this time. Depression: slight improvement  Return to clinic in 4 weeks with labs and follow-up

## 2023-10-12 NOTE — Progress Notes (Signed)
 Patient Care Team: Jesus Bernardino MATSU, MD as PCP - General (Internal Medicine) Odean Potts, MD as Consulting Physician (Hematology and Oncology) Bensimhon, Toribio SAUNDERS, MD as Consulting Physician (Cardiology) Patel, Donika K, DO as Consulting Physician (Neurology)  DIAGNOSIS:  Encounter Diagnosis  Name Primary?   Essential thrombocytosis (HCC) Yes    CHIEF COMPLIANT: Follow-up of essential thrombocytosis and hemochromatosis  HISTORY OF PRESENT ILLNESS:  History of Present Illness Kara [name] is a 73 year old female with myeloproliferative disorder who presents for follow-up regarding her condition and medication management.  She is currently on Jakafi  for her myeloproliferative disorder. She experienced dizziness and nausea after accidentally taking Jakafi  twice in one day, which resolved with rest. She is cautious with her medication intake, documenting doses to prevent errors. Her platelet count is 648, and she has missed two doses of her medication.  She is managing shingles, which she believes is exacerbated by her medication regimen, including hydroxyurea  and anagrelide . She developed a shingles rash in February and suspects Jakafi  may have prolonged the condition. Spinal manipulations twice a week have alleviated symptoms, particularly neck tightness and earlobe redness, without associated heat.  She experiences depression, which she attributes to her medical conditions and medication side effects. This has affected her eating habits, particularly in the late afternoon. She attempted weight loss with the Fast 800 diet, losing seven to eight pounds in a month, but has since reverted to previous habits. Managing shingles has improved her mental health.  She has hemochromatosis and is considering the need for phlebotomy. Her last ferritin level was around 50, and she is awaiting current iron level results to determine if phlebotomy is necessary.     ALLERGIES:  is allergic to demerol  [meperidine hcl], penicillins, and tramadol.  MEDICATIONS:  Current Outpatient Medications  Medication Sig Dispense Refill   aspirin  EC 81 MG tablet Take 1 tablet (81 mg total) by mouth daily.     ruxolitinib  phosphate (JAKAFI ) 5 MG tablet Take 1 tablet (5 mg total) by mouth 2 (two) times daily. 60 tablet 3   trimethoprim -polymyxin b  (POLYTRIM ) ophthalmic solution Place 2 drops into both eyes every 4 (four) hours. 10 mL 0   Current Facility-Administered Medications  Medication Dose Route Frequency Provider Last Rate Last Admin   0.9 %  sodium chloride  infusion  500 mL Intravenous Once Armbruster, Elspeth SQUIBB, MD        PHYSICAL EXAMINATION: ECOG PERFORMANCE STATUS: 1 - Symptomatic but completely ambulatory  Vitals:   10/12/23 1400  BP: (!) 150/72  Pulse: 92  Resp: 17  Temp: 97.7 F (36.5 C)  SpO2: 96%   Filed Weights   10/12/23 1400  Weight: 144 lb (65.3 kg)     LABORATORY DATA:  I have reviewed the data as listed    Latest Ref Rng & Units 08/10/2023   10:18 AM 06/28/2023   10:13 AM 05/31/2023    9:08 AM  CMP  Glucose 70 - 99 mg/dL 87  98  93   BUN 8 - 23 mg/dL 17  17  17    Creatinine 0.44 - 1.00 mg/dL 9.37  9.35  9.34   Sodium 135 - 145 mmol/L 139  141  139   Potassium 3.5 - 5.1 mmol/L 4.1  4.2  3.7   Chloride 98 - 111 mmol/L 105  107  106   CO2 22 - 32 mmol/L 28  27  26    Calcium 8.9 - 10.3 mg/dL 9.7  9.2  9.4  Total Protein 6.5 - 8.1 g/dL 7.3  7.0  7.4   Total Bilirubin 0.0 - 1.2 mg/dL 0.4  0.4  0.4   Alkaline Phos 38 - 126 U/L 47  59  69   AST 15 - 41 U/L 24  18  18    ALT 0 - 44 U/L 13  9  10      Lab Results  Component Value Date   WBC 7.9 10/12/2023   HGB 12.1 10/12/2023   HCT 35.5 (L) 10/12/2023   MCV 93.7 10/12/2023   PLT 648 (H) 10/12/2023   NEUTROABS 5.2 10/12/2023    ASSESSMENT & PLAN:  Essential thrombocytosis (HCC) 06/18/21:  Hb 11.2, Platelet 778 07/30/2021: Hemoglobin 12.9, platelets 783, iron saturation 13%, ferritin 7  10/01/2021: Hemoglobin  14.1, platelets 828, ferritin 22 05/20/2022: Platelets 782, hemoglobin 13.2, iron saturation 64%, ferritin 42, folate 13.3, B12 222 06/09/22: platelets 826 (shingles) 06/21/2022: Platelets 701, iron saturation 66%, ferritin 31 08/26/2022: Platelets 938, SPEP, KL ratio: Normal, rheumatoid factor: 10: Normal, ANA, SSA SSB: Negative 09/14/2022: Ferritin 30, iron saturation 56%, hemoglobin 13.8, platelets 951 12/30/2022: Hemoglobin 13.1, platelets 594, iron saturation 60%, ferritin 26 02/16/2023: Platelets 793 06/28/2023: Platelets 668, iron saturation 52%, ferritin 16 10/11/2024: Platelets 648, iron saturation 56%, ferritin pending    Current treatment: Since the patient is intolerant to anagrelide  and hydroxyurea , currently on Jakafi  at the low-dose of 5 mg twice a day. Started 09/18/2022   Jakafi  toxicities: Denies any major adverse effects to Jakafi  Facial numbness: Saw neurology Dr.Patel: MRI brain: 08/28/2023: No acute findings. Pain on skull and neck: went to chiropractor: Dr.Spell did alignment of skull and pain has resolved. Insomnia: intermittent   Relapse shingles episodes: Waxing and waning symptoms She went on a diet and lost 10 pounds. Hemochromatosis: On periodic phlebotomies. Does not need a phlebotomy at this time. Depression: slight improvement  Return to clinic in 8 weeks with labs and follow-up ------------------------------------- Assessment and Plan Assessment & Plan Essential Thrombocytosis Platelet count at 648, stable despite missed Jakafi  dose. Dizziness and nausea linked to double dosing emphasize adherence importance. - Continue Jakafi  as prescribed. - Monitor platelet count and adjust treatment as necessary.  Postherpetic Neuralgia Symptoms improved with spinal manipulation. No signs of infection, consistent with shingles. - Continue spinal manipulation therapy with chiropractor.  Depression Depression linked to postherpetic neuralgia, affecting weight and eating  habits. Improved with shingles management. - Continue current management strategies for depression. - Focus on weight loss and mental well-being.  Hemochromatosis Previous ferritin level around 50, no phlebotomy needed. - Monitor iron levels and consider phlebotomy if indicated.      Orders Placed This Encounter  Procedures   CBC with Differential ([facility] Only)    Standing Status:   Future    Expiration Date:   10/11/2024   Ferritin    Standing Status:   Future    Expiration Date:   10/11/2024   CMP ([facility] Center only)    Standing Status:   Future    Expiration Date:   10/11/2024   Iron and Iron Binding Capacity ([facility] only)    Standing Status:   Future    Expiration Date:   10/11/2024   The patient has a good understanding of the overall plan. she agrees with it. she will call with any problems that may develop before the next visit here. Total time spent: 30 mins including face to face time and time spent for planning, charting and co-ordination of care   Garretson  MARLA Chad, MD 10/12/23

## 2023-10-18 DIAGNOSIS — M5031 Other cervical disc degeneration,  high cervical region: Secondary | ICD-10-CM | POA: Diagnosis not present

## 2023-10-18 DIAGNOSIS — M9901 Segmental and somatic dysfunction of cervical region: Secondary | ICD-10-CM | POA: Diagnosis not present

## 2023-10-24 DIAGNOSIS — M5031 Other cervical disc degeneration,  high cervical region: Secondary | ICD-10-CM | POA: Diagnosis not present

## 2023-10-24 DIAGNOSIS — M9901 Segmental and somatic dysfunction of cervical region: Secondary | ICD-10-CM | POA: Diagnosis not present

## 2023-10-26 ENCOUNTER — Encounter: Payer: Self-pay | Admitting: Hematology and Oncology

## 2023-10-26 ENCOUNTER — Ambulatory Visit
Admission: RE | Admit: 2023-10-26 | Discharge: 2023-10-26 | Disposition: A | Discharge status: Home / Self Care | Source: Ambulatory Visit | Attending: Internal Medicine | Admitting: Internal Medicine

## 2023-10-26 DIAGNOSIS — I7121 Aneurysm of the ascending aorta, without rupture: Secondary | ICD-10-CM

## 2023-10-26 DIAGNOSIS — R911 Solitary pulmonary nodule: Secondary | ICD-10-CM | POA: Diagnosis not present

## 2023-10-26 DIAGNOSIS — I77810 Thoracic aortic ectasia: Secondary | ICD-10-CM | POA: Diagnosis not present

## 2023-10-26 MED ORDER — IOPAMIDOL (ISOVUE-370) INJECTION 76%
75.0000 mL | Freq: Once | INTRAVENOUS | Status: AC | PRN
  Administered 2023-10-26: 75 mL via INTRAVENOUS

## 2023-10-28 ENCOUNTER — Ambulatory Visit: Payer: Self-pay | Admitting: Internal Medicine

## 2023-10-30 DIAGNOSIS — M5031 Other cervical disc degeneration,  high cervical region: Secondary | ICD-10-CM | POA: Diagnosis not present

## 2023-10-30 DIAGNOSIS — M9901 Segmental and somatic dysfunction of cervical region: Secondary | ICD-10-CM | POA: Diagnosis not present

## 2023-10-31 DIAGNOSIS — G939 Disorder of brain, unspecified: Secondary | ICD-10-CM | POA: Diagnosis not present

## 2023-10-31 DIAGNOSIS — B0229 Other postherpetic nervous system involvement: Secondary | ICD-10-CM | POA: Diagnosis not present

## 2023-10-31 DIAGNOSIS — Z1331 Encounter for screening for depression: Secondary | ICD-10-CM | POA: Diagnosis not present

## 2023-10-31 DIAGNOSIS — Z8619 Personal history of other infectious and parasitic diseases: Secondary | ICD-10-CM | POA: Diagnosis not present

## 2023-11-02 DIAGNOSIS — M5031 Other cervical disc degeneration,  high cervical region: Secondary | ICD-10-CM | POA: Diagnosis not present

## 2023-11-02 DIAGNOSIS — M9901 Segmental and somatic dysfunction of cervical region: Secondary | ICD-10-CM | POA: Diagnosis not present

## 2023-11-09 DIAGNOSIS — M5031 Other cervical disc degeneration,  high cervical region: Secondary | ICD-10-CM | POA: Diagnosis not present

## 2023-11-09 DIAGNOSIS — M9901 Segmental and somatic dysfunction of cervical region: Secondary | ICD-10-CM | POA: Diagnosis not present

## 2023-11-10 ENCOUNTER — Ambulatory Visit: Admitting: Internal Medicine

## 2023-11-15 ENCOUNTER — Encounter: Payer: Self-pay | Admitting: Internal Medicine

## 2023-11-15 ENCOUNTER — Telehealth: Admitting: Internal Medicine

## 2023-11-15 NOTE — Progress Notes (Signed)
 Provider running late, patient had to leave.  No service provided.

## 2023-11-16 DIAGNOSIS — M5031 Other cervical disc degeneration,  high cervical region: Secondary | ICD-10-CM | POA: Diagnosis not present

## 2023-11-16 DIAGNOSIS — M9901 Segmental and somatic dysfunction of cervical region: Secondary | ICD-10-CM | POA: Diagnosis not present

## 2023-11-22 ENCOUNTER — Telehealth: Admitting: Internal Medicine

## 2023-11-22 ENCOUNTER — Encounter: Payer: Self-pay | Admitting: Internal Medicine

## 2023-11-22 DIAGNOSIS — I700 Atherosclerosis of aorta: Secondary | ICD-10-CM | POA: Diagnosis not present

## 2023-11-22 DIAGNOSIS — B0229 Other postherpetic nervous system involvement: Secondary | ICD-10-CM | POA: Diagnosis not present

## 2023-11-22 DIAGNOSIS — R635 Abnormal weight gain: Secondary | ICD-10-CM | POA: Diagnosis not present

## 2023-11-22 DIAGNOSIS — D473 Essential (hemorrhagic) thrombocythemia: Secondary | ICD-10-CM | POA: Diagnosis not present

## 2023-11-22 MED ORDER — ALPHA-LIPOIC ACID 600 MG PO CAPS: 1.0000 | ORAL_CAPSULE | Freq: Every day | ORAL | 3 refills | Status: AC

## 2023-11-22 NOTE — Assessment & Plan Note (Signed)
 She has mild to moderate aortic atherosclerosis. Dietary modifications to protect her arteries include increased intake of fish, avocados, and extra virgin olive oil. Statin therapy is discussed but deferred due to current pain management priorities. Add a cholesterol check to future blood work.

## 2023-11-22 NOTE — Assessment & Plan Note (Signed)
 Essential thrombocythemia contributes to her immunocompromised state, potentially exacerbating shingles severity. Defer to hematology

## 2023-11-22 NOTE — Progress Notes (Addendum)
 ====================================  [facility]: [phone]   --  Virtual Video Medical Office Visit --  Patient: Kara [name]      Age: 73 y.o.       Sex:  female  Date:   11/22/2023 Today's Healthcare Provider: Bernardino KANDICE Cone, MD  ====================================    Chief Complaint/Reason For Visit: Chronic disease monitoring   Chart reviewed: has Hereditary hemochromatosis (HCC); Essential thrombocytosis (HCC); Nasal obstruction; Facial numbness; Post herpetic neuralgia; History of smoking 25-50 pack years; Night sweats; Intrinsic aging of facial skin; Skin rash; History of colon polyps; Emphysema lung (HCC); Aortic aneurysm, thoracic (HCC); Lung nodules; Aortic atherosclerosis (HCC); and Abnormal mammogram of both breasts on their problem list..  Chart reviewed:  has a past medical history of Allergy (Unknown), Anemia (Unknown), Aneurysm of ascending aorta without rupture (HCC), Aortic atherosclerosis (HCC), Cataract (One month), Clotting disorder (HCC) (Unknown), Essential thrombocytosis (HCC), Hemochromatosis, History of smoking 25-50 pack years, Hypertension (Unknown), Lung nodule, Orthostatic hypotension (05/20/2016), Other emphysema (HCC), and Sleep apnea (Unknown). Discussed the use of AI scribe software for clinical note transcription with the patient, who gave verbal consent to proceed.  History of Present Illness  73 year old female who presents with persistent postherpetic neuralgia.  She experiences persistent postherpetic neuralgia, describing the pain as exhausting and worsening in the evening, especially after busy days or when fatigued. The pain is primarily located on her chest and face, accompanied by internal numbness. It is debilitating, affecting her concentration and causing feelings of being overwhelmed.  She has been seeking relief through chiropractic treatments, which provide temporary relief for about a week. The  treatments focus on the area where the headset connects into the spine and across the shoulder. She has also tried L-lysine supplements but has not yet tried alpha lipoic acid.  She has a history of hemochromatosis and thrombocytosis. She expressed hesitancy about receiving the shingles vaccine because of her concerns related to hemochromatosis and platelet storage issues.  She mentions weight gain over the past three years, attributing it to feeling tired and short of breath. She is attempting to manage her weight through diet, aiming to return to a gluten-free and sugar-free regimen.   Medications reviewed Current Outpatient Medications on File Prior to Visit  Medication Sig   aspirin  EC 81 MG tablet Take 1 tablet (81 mg total) by mouth daily.   ruxolitinib  phosphate (JAKAFI ) 5 MG tablet Take 1 tablet (5 mg total) by mouth 2 (two) times daily.   trimethoprim -polymyxin b  (POLYTRIM ) ophthalmic solution Place 2 drops into both eyes every 4 (four) hours.   Current Facility-Administered Medications on File Prior to Visit  Medication   0.9 %  sodium chloride  infusion  There are no discontinued medications.      Virtual Physical Exam  General Appearance:  Well Developed, Well Nourished, No Acute Distress by Limited Video Assessment Pulmonary:  No Respiratory Distress Apparent. Normal Work of Breathing.   Neurological:  Awake, Alert. No Obvious Focal Neurological Deficits or Cognitive Impairments.  Sensorium Seems Unclouded. Psychiatric:  Appropriate Mood, Pleasant Demeanor, Calm, Articulate, Good Mood   Appointment on 10/12/2023  Component Date Value   Iron 10/12/2023 193 (H)    TIBC 10/12/2023 287    Saturation Ratios 10/12/2023 67 (H)    UIBC 10/12/2023 94 (L)    Ferritin 10/12/2023 59    WBC Count 10/12/2023 7.9    RBC 10/12/2023 3.79 (L)    Hemoglobin 10/12/2023 12.1    HCT  10/12/2023 35.5 (L)    MCV 10/12/2023 93.7    MCH 10/12/2023 31.9    MCHC 10/12/2023 34.1    RDW  10/12/2023 15.3    Platelet Count 10/12/2023 648 (H)    nRBC 10/12/2023 0.0    Neutrophils Relative % 10/12/2023 66    Neutro Abs 10/12/2023 5.2    Lymphocytes Relative 10/12/2023 24    Lymphs Abs 10/12/2023 1.9    Monocytes Relative 10/12/2023 7    Monocytes Absolute 10/12/2023 0.6    Eosinophils Relative 10/12/2023 2    Eosinophils Absolute 10/12/2023 0.2    Basophils Relative 10/12/2023 0    Basophils Absolute 10/12/2023 0.0    Immature Granulocytes 10/12/2023 1    Abs Immature Granulocytes 10/12/2023 0.04   Appointment on 08/10/2023  Component Date Value   WBC Count 08/10/2023 8.1    RBC 08/10/2023 4.38    Hemoglobin 08/10/2023 13.5    HCT 08/10/2023 39.5    MCV 08/10/2023 90.2    MCH 08/10/2023 30.8    MCHC 08/10/2023 34.2    RDW 08/10/2023 13.8    Platelet Count 08/10/2023 706 (H)    nRBC 08/10/2023 0.0    Neutrophils Relative % 08/10/2023 60    Neutro Abs 08/10/2023 4.8    Lymphocytes Relative 08/10/2023 30    Lymphs Abs 08/10/2023 2.4    Monocytes Relative 08/10/2023 7    Monocytes Absolute 08/10/2023 0.6    Eosinophils Relative 08/10/2023 2    Eosinophils Absolute 08/10/2023 0.2    Basophils Relative 08/10/2023 1    Basophils Absolute 08/10/2023 0.0    Immature Granulocytes 08/10/2023 0    Abs Immature Granulocytes 08/10/2023 0.03    Ferritin 08/10/2023 56    Iron 08/10/2023 155    TIBC 08/10/2023 256    Saturation Ratios 08/10/2023 61 (H)    UIBC 08/10/2023 101 (L)    Sodium 08/10/2023 139    Potassium 08/10/2023 4.1    Chloride 08/10/2023 105    CO2 08/10/2023 28    Glucose, Bld 08/10/2023 87    BUN 08/10/2023 17    Creatinine 08/10/2023 0.62    Calcium 08/10/2023 9.7    Total Protein 08/10/2023 7.3    Albumin 08/10/2023 5.0    AST 08/10/2023 24    ALT 08/10/2023 13    Alkaline Phosphatase 08/10/2023 47    Total Bilirubin 08/10/2023 0.4    GFR, Estimated 08/10/2023 >60    Anion gap 08/10/2023 6   Appointment on 06/28/2023  Component Date Value    Sodium 06/28/2023 141    Potassium 06/28/2023 4.2    Chloride 06/28/2023 107    CO2 06/28/2023 27    Glucose, Bld 06/28/2023 98    BUN 06/28/2023 17    Creatinine 06/28/2023 0.64    Calcium 06/28/2023 9.2    Total Protein 06/28/2023 7.0    Albumin 06/28/2023 4.7    AST 06/28/2023 18    ALT 06/28/2023 9    Alkaline Phosphatase 06/28/2023 59    Total Bilirubin 06/28/2023 0.4    GFR, Estimated 06/28/2023 >60    Anion gap 06/28/2023 7    Iron 06/28/2023 153    TIBC 06/28/2023 295    Saturation Ratios 06/28/2023 52 (H)    UIBC 06/28/2023 142 (L)    Ferritin 06/28/2023 19    WBC Count 06/28/2023 7.4    RBC 06/28/2023 4.00    Hemoglobin 06/28/2023 12.8    HCT 06/28/2023 38.6    MCV 06/28/2023 96.5    [facility] 06/28/2023  32.0    MCHC 06/28/2023 33.2    RDW 06/28/2023 13.7    Platelet Count 06/28/2023 668 (H)    nRBC 06/28/2023 0.0    Neutrophils Relative % 06/28/2023 59    Neutro Abs 06/28/2023 4.3    Lymphocytes Relative 06/28/2023 31    Lymphs Abs 06/28/2023 2.3    Monocytes Relative 06/28/2023 7    Monocytes Absolute 06/28/2023 0.5    Eosinophils Relative 06/28/2023 3    Eosinophils Absolute 06/28/2023 0.2    Basophils Relative 06/28/2023 0    Basophils Absolute 06/28/2023 0.0    Immature Granulocytes 06/28/2023 0    Abs Immature Granulocytes 06/28/2023 0.02   Appointment on 05/31/2023  Component Date Value   Sodium 05/31/2023 139    Potassium 05/31/2023 3.7    Chloride 05/31/2023 106    CO2 05/31/2023 26    Glucose, Bld 05/31/2023 93    BUN 05/31/2023 17    Creatinine 05/31/2023 0.65    Calcium 05/31/2023 9.4    Total Protein 05/31/2023 7.4    Albumin 05/31/2023 4.8    AST 05/31/2023 18    ALT 05/31/2023 10    Alkaline Phosphatase 05/31/2023 69    Total Bilirubin 05/31/2023 0.4    GFR, Estimated 05/31/2023 >60    Anion gap 05/31/2023 7    Iron 05/31/2023 159    TIBC 05/31/2023 328    Saturation Ratios 05/31/2023 49 (H)    UIBC 05/31/2023 169    Ferritin  05/31/2023 18    WBC Count 05/31/2023 8.8    RBC 05/31/2023 3.98    Hemoglobin 05/31/2023 13.0    HCT 05/31/2023 39.2    MCV 05/31/2023 98.5    MCH 05/31/2023 32.7    MCHC 05/31/2023 33.2    RDW 05/31/2023 14.4    Platelet Count 05/31/2023 729 (H)    nRBC 05/31/2023 0.0    Neutrophils Relative % 05/31/2023 60    Neutro Abs 05/31/2023 5.2    Lymphocytes Relative 05/31/2023 30    Lymphs Abs 05/31/2023 2.7    Monocytes Relative 05/31/2023 7    Monocytes Absolute 05/31/2023 0.6    Eosinophils Relative 05/31/2023 3    Eosinophils Absolute 05/31/2023 0.3    Basophils Relative 05/31/2023 0    Basophils Absolute 05/31/2023 0.0    Immature Granulocytes 05/31/2023 0    Abs Immature Granulocytes 05/31/2023 0.03   Appointment on 05/01/2023  Component Date Value   Sodium 05/01/2023 139    Potassium 05/01/2023 4.2    Chloride 05/01/2023 105    CO2 05/01/2023 27    Glucose, Bld 05/01/2023 90    BUN 05/01/2023 15    Creatinine 05/01/2023 0.63    Calcium 05/01/2023 9.6    Total Protein 05/01/2023 7.4    Albumin 05/01/2023 4.9    AST 05/01/2023 18    ALT 05/01/2023 8    Alkaline Phosphatase 05/01/2023 62    Total Bilirubin 05/01/2023 0.4    GFR, Estimated 05/01/2023 >60    Anion gap 05/01/2023 7    Iron 05/01/2023 168    TIBC 05/01/2023 316    Saturation Ratios 05/01/2023 53 (H)    UIBC 05/01/2023 148    Ferritin 05/01/2023 21    WBC Count 05/01/2023 9.0    RBC 05/01/2023 3.82 (L)    Hemoglobin 05/01/2023 12.1    HCT 05/01/2023 37.1    MCV 05/01/2023 97.1    MCH 05/01/2023 31.7    MCHC 05/01/2023 32.6    RDW 05/01/2023 16.1 (H)  Platelet Count 05/01/2023 755 (H)    nRBC 05/01/2023 0.0    Neutrophils Relative % 05/01/2023 55    Neutro Abs 05/01/2023 5.1    Lymphocytes Relative 05/01/2023 32    Lymphs Abs 05/01/2023 2.9    Monocytes Relative 05/01/2023 7    Monocytes Absolute 05/01/2023 0.6    Eosinophils Relative 05/01/2023 4    Eosinophils Absolute 05/01/2023 0.3     Basophils Relative 05/01/2023 1    Basophils Absolute 05/01/2023 0.1    Immature Granulocytes 05/01/2023 1    Abs Immature Granulocytes 05/01/2023 0.05   Procedure visit on 04/10/2023  Component Date Value   SURGICAL PATHOLOGY 04/10/2023                     Value:SURGICAL PATHOLOGY [facility] [address], [address] [place], [place] [address] Telephone [phone] or [phone] Fax [phone]  REPORT OF SURGICAL PATHOLOGY   Accession #: [id number] Patient Name: LUCRESHA, DISMUKE Visit # : [id number]  MRN: [id number] Physician: Leigh Standing DOB/Age Mar 28, 1951 (Age: 37) Gender: F Collected Date: 04/10/2023 Received Date: 04/13/2023  FINAL DIAGNOSIS       1. 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 Surgical [P], Colon, Cecum, Polyp (1)                          2. Surgical [P], Colon, Transverse, Sigmoid, Rectal, Polyp (5)  SPECIMEN COMMENTS: 1. Positive colorectal cancer screening using cologuard test; benign neoplasm of cecum; benign neoplasm of transverse colon; benign neoplasm of sigmoid colon; benign neoplasm of rectum and anal canal SPECIMEN CLINICAL INFORMATION: 1. R/O adenoma 2. R/O adenoma    Gross Description 1. Received in formalin are tan, soft tissue fragments that are submitted in toto.Number: 1 Size: 1.1 cm, (1B) ( TA ) 2. Received in formalin are tan, soft tissue fragments that are submitted in toto.Only 4 pol in bottle Number: 4, Size: 0.3 cm smallest to 0.9 cm  largest, (1B) ( TA )        Report signed out from the following location(s) [facility] [address] [place], [place] [address] CLIA #: [id number]  [facility] [address] [place], [place] [address] CLIA #: [id number]   Appointment on 03/30/2023  Component Date Value   Sodium 03/30/2023 139    Potassium 03/30/2023 4.4    Chloride 03/30/2023 104    CO2 03/30/2023 28    Glucose, Bld 03/30/2023 98    BUN 03/30/2023 16    Creatinine 03/30/2023 0.60    Calcium 03/30/2023 9.7    Total Protein 03/30/2023 7.6    Albumin 03/30/2023 5.0    AST 03/30/2023 20    ALT 03/30/2023 10    Alkaline Phosphatase 03/30/2023 61    Total Bilirubin 03/30/2023 0.4    GFR, Estimated 03/30/2023 >60    Anion gap 03/30/2023 7    Iron 03/30/2023 174 (H)  TIBC 03/30/2023 274    Saturation Ratios 03/30/2023 63 (H)    UIBC 03/30/2023 100 (L)    Ferritin 03/30/2023 45    WBC Count 03/30/2023 9.5    RBC 03/30/2023 4.08    Hemoglobin 03/30/2023 12.9    HCT 03/30/2023 39.0    MCV 03/30/2023 95.6    MCH 03/30/2023 31.6    MCHC 03/30/2023 33.1    RDW 03/30/2023 14.8    Platelet Count 03/30/2023 751 (H)    nRBC 03/30/2023 0.0    Neutrophils Relative % 03/30/2023 62    Neutro Abs 03/30/2023 5.9    Lymphocytes Relative 03/30/2023 27    Lymphs Abs 03/30/2023 2.5    Monocytes Relative 03/30/2023 8    Monocytes Absolute 03/30/2023 0.7    Eosinophils Relative 03/30/2023 3    Eosinophils Absolute 03/30/2023 0.3    Basophils Relative 03/30/2023 0    Basophils Absolute 03/30/2023 0.0    Immature Granulocytes 03/30/2023 0    Abs Immature Granulocytes 03/30/2023 0.02   Appointment on 02/16/2023  Component Date Value   Sodium 02/16/2023 137    Potassium 02/16/2023 4.1    Chloride 02/16/2023 103    CO2 02/16/2023 26    Glucose, Bld 02/16/2023 92    BUN 02/16/2023 24 (H)    Creatinine 02/16/2023 0.66    Calcium 02/16/2023 9.9    Total Protein 02/16/2023 7.8     Albumin 02/16/2023 5.1 (H)    AST 02/16/2023 20    ALT 02/16/2023 12    Alkaline Phosphatase 02/16/2023 64    Total Bilirubin 02/16/2023 0.5    GFR, Estimated 02/16/2023 >60    Anion gap 02/16/2023 8    Iron 02/16/2023 221 (H)    TIBC 02/16/2023 308    Saturation Ratios 02/16/2023 72 (H)    UIBC 02/16/2023 87 (L)    Ferritin 02/16/2023 56    WBC Count 02/16/2023 10.1    RBC 02/16/2023 4.31    Hemoglobin 02/16/2023 13.3    HCT 02/16/2023 40.5    MCV 02/16/2023 94.0    MCH 02/16/2023 30.9    MCHC 02/16/2023 32.8    RDW 02/16/2023 14.3    Platelet Count 02/16/2023 793 (H)    nRBC 02/16/2023 0.0    Neutrophils Relative % 02/16/2023 59    Neutro Abs 02/16/2023 6.0    Lymphocytes Relative 02/16/2023 30    Lymphs Abs 02/16/2023 3.1    Monocytes Relative 02/16/2023 7    Monocytes Absolute 02/16/2023 0.7    Eosinophils Relative 02/16/2023 3    Eosinophils Absolute 02/16/2023 0.3    Basophils Relative 02/16/2023 1    Basophils Absolute 02/16/2023 0.1    Immature Granulocytes 02/16/2023 0    Abs Immature Granulocytes 02/16/2023 0.04   Appointment on 01/16/2023  Component Date Value   Sodium 01/16/2023 137    Potassium 01/16/2023 4.2    Chloride 01/16/2023 105    CO2 01/16/2023 24    Glucose, Bld 01/16/2023 93    BUN 01/16/2023 9    Creatinine 01/16/2023 0.64    Calcium 01/16/2023 9.6    Total Protein 01/16/2023 7.2    Albumin 01/16/2023 4.9    AST 01/16/2023 21    ALT 01/16/2023 11    Alkaline Phosphatase 01/16/2023 47    Total Bilirubin 01/16/2023 0.4    GFR, Estimated 01/16/2023 >60    Anion gap 01/16/2023 8    Iron 01/16/2023 164    TIBC 01/16/2023 274    Saturation Ratios 01/16/2023 60 (H)  UIBC 01/16/2023 110 (L)    Ferritin 01/16/2023 26    WBC Count 01/16/2023 7.5    RBC 01/16/2023 4.25    Hemoglobin 01/16/2023 13.1    HCT 01/16/2023 39.7    MCV 01/16/2023 93.4    MCH 01/16/2023 30.8    MCHC 01/16/2023 33.0    RDW 01/16/2023 14.3    Platelet Count  01/16/2023 594 (H)    nRBC 01/16/2023 0.0    Neutrophils Relative % 01/16/2023 62    Neutro Abs 01/16/2023 4.7    Lymphocytes Relative 01/16/2023 28    Lymphs Abs 01/16/2023 2.1    Monocytes Relative 01/16/2023 7    Monocytes Absolute 01/16/2023 0.6    Eosinophils Relative 01/16/2023 3    Eosinophils Absolute 01/16/2023 0.2    Basophils Relative 01/16/2023 0    Basophils Absolute 01/16/2023 0.0    Immature Granulocytes 01/16/2023 0    Abs Immature Granulocytes 01/16/2023 0.03   Hospital Outpatient Visit on 12/29/2022  Component Date Value   SURGICAL PATHOLOGY 12/29/2022                     Value:SURGICAL PATHOLOGY [facility] [address], [address] [place], [place] [address] Telephone [phone] or [phone] Fax [phone]  REPORT OF SURGICAL PATHOLOGY   Accession #: [id number] Patient Name: MARSENA, TAFF Visit # : [id number]  MRN: [id number] Physician: Alphia Mom DOB/Age [birth year] (Age: 66) Gender: F Collected Date: 12/29/2022 Received Date: 12/29/2022  FINAL DIAGNOSIS       1. Breast, right, needle core biopsy, mass, 12:00 :       BENIGN BREAST TISSUE WITH STROMAL FIBROSIS.      NEGATIVE FOR MALIGNANCY.      SEE NOTE.       2. Breast, right, needle core biopsy, 2:00, mass :       BENIGN BREAST TISSUE WITH STROMAL FIBROSIS.      NEGATIVE FOR MALIGNANCY.      SEE NOTE.       3. Breast, left, needle core biopsy, 1:00, mass :       BENIGN BREAST TISSUE WITH STROMAL FIBROSIS.      NEGATIVE FOR MALIGNANCY.      SEE NOTE.       Diagnosis Note : - 3.[facility] was notified on 12/30/2022.                               ELECTRONIC SIGNATURE : Belvie Come, John, Pathologist, Electronic Signature  MICROSCOPIC DESCRIPTION  CASE COMMENTS STAINS USED IN DIAGNOSIS: H&E-2 H&E-3 H&E-4 H&E H&E-2 H&E-3 H&E-4 H&E H&E-2 H&E-3 H&E-4 H&E    CLINICAL HISTORY  SPECIMEN(S) OBTAINED 1. Breast, right, needle  core biopsy, Mass, 12:00 2. Breast, right, needle core biopsy, 2:00, Mass 3. Breast, left, needle core biopsy, 1:00, Mass  SPECIMEN COMMENTS: 1. In formalin: 1:30, CIT < 1 min, ribbon clip 2. In formalin: 1:45, CIT < 1 min, coil clip 3. In formalin: 2:15, CIT < 1 min, ribbon clip SPECIMEN CLINICAL INFORMATION: 1. Multiple bilateral similar appearing oval masses likely FA R/O atypia or malignancy    Gross Description 1. Received in formalin labeled with the patient's name and right breast 12 o'clock, 1 cm from nipple are four fragments to cores of fibroadipose tissue ranging from 0.5 to 1.0 cm in length, each measuring 0.1 cm in diameter.The specimen is entirely submitte  d in one block.      TIF 1:30 p.m. CIT less than 1 min. 2. Received in formalin labeled with the patient's name and right breast 2 o'clock, 4 cm from nipple are four cores of fibroadipose tissue ranging from 0.7 to 1.1 cm in length, each measuring 0.1 cm in diameter.The specimen is entirely submitted in one block.      TIF 1:45 p.m. CIT less than 1 min. 3. Received in formalin labeled with the patient's name and left breast 1 o'clock, 6 cm from nipple are five fragments to cores of fibroadipose tissue ranging from 0.4 to 1.3 cm in length, each measuring 0.1 cm in diameter.The specimen is entirely submitted in one block.      TIF 2:15 p.m. CIT less than 1 min. (KW:gt, 12/30/22)        Report signed out from the following location(s) [place] [address] [place], [place] [address] CLIA #: [id number]  [facility] [address] [place], [place] [address] CLIA #: [id number]   There may be more visits with results that are not included.  No image results found. CT ANGIO CHEST AORTA W/CM & OR WO/CM Result Date: 10/26/2023 CLINICAL DATA:  Ascending thoracic aortic aneurysm EXAM: CT ANGIOGRAPHY CHEST WITH CONTRAST TECHNIQUE: Multidetector CT  imaging of the chest was performed using the standard protocol during bolus administration of intravenous contrast. Multiplanar CT image reconstructions and MIPs were obtained to evaluate the vascular anatomy. RADIATION DOSE REDUCTION: This exam was performed according to the departmental dose-optimization program which includes automated exposure control, adjustment of the mA and/or kV according to patient size and/or use of iterative reconstruction technique. CONTRAST:  75mL ISOVUE -370 IOPAMIDOL  (ISOVUE -370) INJECTION 76% COMPARISON:  02/10/2023 FINDINGS: Cardiovascular: Mild dilatation of the ascending thoracic aorta, maximal diameter 40.2 mm. No interval change. No acute aortic process or dissection. Two vessel arch anatomy appearing patent. Remainder of the aorta is normal in caliber. Minor scattered thoracic aortic atherosclerosis. Normal heart size. No pericardial effusion. Central pulmonary arteries are normal in caliber and patent. No significant filling defect or pulmonary embolus by CTA. Central venous structures are patent. No veno-occlusive process. Mediastinum/Nodes: No enlarged mediastinal, hilar, or axillary lymph nodes. Thyroid  gland, trachea, and esophagus demonstrate no significant findings. Lungs/Pleura: Mild centrilobular emphysema pattern. Anterior left upper lobe 7 mm nodule again noted, stable, image 52/6. Scattered areas of atelectasis/scarring in the inferior right upper lobe, lingula, and both lower lobes. No acute airspace process, significant collapse or consolidation. Trachea and central airways appear patent. No interstitial process or edema. No pleural abnormality, effusion, or pneumothorax. Upper Abdomen: No acute abnormality. Musculoskeletal: No chest wall abnormality. No acute or significant osseous findings. Review of the MIP images confirms the above findings. IMPRESSION: 1. Stable mild dilatation of the ascending thoracic aorta, maximal diameter 40.2 mm. Recommend annual imaging  followup by CTA or MRA. This recommendation follows 2010 ACCF/AHA/AATS/ACR/ASA/SCA/SCAI/SIR/STS/SVM Guidelines for the Diagnosis and Management of Patients with Thoracic Aortic Disease. Circulation. 2010; 121: Z733-z630. Aortic aneurysm NOS (ICD10-I71.9) 2. No acute intrathoracic finding. 3. Stable 7 mm left upper lobe nodule. Recommend continued annual screening with low-dose chest CT as previously recommended on the 02/10/2023 exam. Aortic Atherosclerosis (ICD10-I70.0) and Emphysema (ICD10-J43.9). Electronically Signed   By: CHRISTELLA.  Shick M.D.   On: 10/26/2023 16:19       Assessment & Plan Post herpetic neuralgia Chronic shingles pain with postherpetic neuralgia affects her chest and face, causing numbness and debilitating pain worsened by  stress and fatigue. Chiropractic treatment offers temporary relief. She has concerns about the shingles vaccine due to hemochromatosis and thrombocytosis but is reassured it will not exacerbate shingles. Electroacupuncture is suggested as potentially more effective than regular acupuncture or TENS for pain relief, though insurance coverage is uncertain. Send a prescription for alpha lipoic acid to [facility]. Refer her to an electroacupuncturist and provide a list of licensed acupuncturists, including [facility]. Encourage continuation of chiropractic treatment and discuss the shingles vaccine with Doctor Gudina. Aortic atherosclerosis (HCC) She has mild to moderate aortic atherosclerosis. Dietary modifications to protect her arteries include increased intake of fish, avocados, and extra virgin olive oil. Statin therapy is discussed but deferred due to current pain management priorities. Add a cholesterol check to future blood work. Essential thrombocytosis (HCC) Essential thrombocythemia contributes to her immunocompromised state, potentially exacerbating shingles severity. Defer to hematology Weight gain Obesity contributes to fatigue and shortness  of breath, with weight gain over the past three years. Dietary changes to address weight include a gluten-free and sugar-free diet. Weight loss is anticipated to improve overall health and potentially reduce shingles severity. Encourage adherence to a gluten-free and sugar-free diet.       Orders Placed During this Encounter:   Orders Placed This Encounter  Procedures   Lipid panel    Standing Status:   Future    Expiration Date:   11/21/2024   Ambulatory referral for Acupuncture    Referral Priority:   Routine    Referral Type:   Consultation    Referral Reason:   Specialty Services Required    Requested Specialty:   Joint Surgery    Number of Visits Requested:   1   Meds ordered this encounter  Medications   Alpha-Lipoic Acid 600 MG CAPS    Sig: Take 1 capsule (600 mg total) by mouth daily at 6 (six) AM.    Dispense:  90 capsule    Refill:  3    Treatment plan discussed and reviewed in detail. Explained medication safety and potential side effects.  Answered all patient questions and confirmed understanding and comfort with the plan. Encouraged patient to contact our office if they have any questions or concerns.  Agreed on patient coming for a sooner office visit if symptoms worsen, persist, or new symptoms develop. Discussed precautions in case of needing to visit the Emergency Department.    ----------------------------------------------------- Attestation:  Today's Healthcare Provider Bernardino KANDICE Cone, MD was located at office at [facility] at [facility] [address] [place] [address].  The patient was located at home. All video encounter participant identities and locations confirmed visually and verbally.Today's Telemedicine visit was conducted via synchronous Video after consent for telemedicine was obtained:  Video connection was lost when less than 50% of the duration of the visit was complete, at which time the remainder of the visit was completed via  audio only.      Time spent on audio only was approximately 15 minutes  This document was transcribed and resynthesized, in part, by artificial intelligence (Abridge) using HIPAA-compliant recording of the clinical interaction;   We have discussed the our use of AI scribe software for clinical note transcription with the patient, who has given verbal consent to proceed.

## 2023-11-22 NOTE — Patient Instructions (Addendum)
 It was a pleasure seeing you today! Your health and satisfaction are our top priorities.  Bernardino Cone, MD  VISIT SUMMARY: Today, we discussed your persistent postherpetic neuralgia and its impact on your daily life. We also reviewed your concerns about the shingles vaccine, your history of hemochromatosis and thrombocytosis, and your recent weight gain. We explored various treatment options and lifestyle changes to help manage your symptoms and improve your overall health.  YOUR PLAN: -POSTHERPETIC NEURALGIA AND CHRONIC SHINGLES PAIN: Postherpetic neuralgia is a nerve pain that occurs after a shingles infection. It can be very painful and is often worse with stress and fatigue. We recommend trying electroacupuncture, which may be more effective than regular acupuncture or TENS for pain relief. We will send a prescription for alpha lipoic acid to [facility]. Please continue with your chiropractic treatments and discuss the shingles vaccine with Doctor Gudina. We have also provided a list of licensed acupuncturists, including [facility].  -AORTIC ATHEROSCLEROSIS: Aortic atherosclerosis is the buildup of fats and cholesterol in the artery walls, which can restrict blood flow. To help protect your arteries, we suggest increasing your intake of fish, avocados, and extra virgin olive oil. We will add a cholesterol check to your future blood work. Statin therapy is an option but will be considered later due to your current pain management priorities.  -HEREDITARY HEMOCHROMATOSIS: Hereditary hemochromatosis is a condition where your body absorbs too much iron from the food you eat, which can affect your immune system. This may make your shingles more severe.  -ESSENTIAL THROMBOCYTHEMIA (THROMBOCYTOSIS): Essential thrombocythemia is a condition where your body produces too many platelets, which can also affect your immune system and potentially make your shingles more severe.  -OBESITY:  Obesity can contribute to fatigue and shortness of breath. You have gained weight over the past three years, and we recommend a gluten-free and sugar-free diet to help manage your weight. Losing weight can improve your overall health and may reduce the severity of your shingles.  INSTRUCTIONS: Please follow up with Doctor Gudina to discuss the shingles vaccine. Continue with your chiropractic treatments and consider trying electroacupuncture. We will send a prescription for alpha lipoic acid to [facility]. Make sure to include more fish, avocados, and extra virgin olive oil in your diet, and adhere to a gluten-free and sugar-free diet. We will add a cholesterol check to your future blood work.  Your Providers PCP: Cone Bernardino MATSU, MD,  [phone]) Referring Provider: Cone Bernardino MATSU, MD,  [phone]) Care Team Provider: Odean Potts, MD,  [phone]) Care Team Provider: Cherrie Toribio SAUNDERS, MD,  [phone]) Care Team Provider: Patel, Donika K, DO,  [phone])  NEXT STEPS: [x]  Early Intervention: Schedule sooner appointment, call our on-call services, or go to emergency room if there is any significant Increase in pain or discomfort New or worsening symptoms Sudden or severe changes in your health [x]  Flexible Follow-Up: We recommend a Return in about 6 months (around 05/24/2024) for review problems and medications. for optimal routine care. This allows for progress monitoring and treatment adjustments. [x]  Preventive Care: Schedule your annual preventive care visit! It's typically covered by insurance and helps identify potential health issues early. [x]  Lab & X-ray Appointments: Incomplete tests scheduled today, or call to schedule. X-rays: [facility] (M-F, 8:30am-noon or 1pm-5pm). [x]  Medical Information Release: Sign a release form at front desk to obtain relevant medical information we don't have.  MAKING THE MOST OF OUR FOCUSED 20 MINUTE APPOINTMENTS: [x]    Clearly  state your top concerns at the beginning of the visit to focus our discussion [x]   If you anticipate you will need more time, please inform the front desk during scheduling - we can book multiple appointments in the same week. [x]   If you have transportation problems- use our convenient video appointments or ask about transportation support. [x]   We can get down to business faster if you use MyChart to update information before the visit and submit non-urgent questions before your visit. Thank you for taking the time to provide details through MyChart.  Let our nurse know and she can import this information into your encounter documents.  Arrival and Wait Times: [x]   Arriving on time ensures that everyone receives prompt attention. [x]   Early morning (8a) and afternoon (1p) appointments tend to have shortest wait times. [x]   Unfortunately, we cannot delay appointments for late arrivals or hold slots during phone calls.  Getting Answers and Following Up [x]   Simple Questions & Concerns: For quick questions or basic follow-up after your visit, reach us  at [phone] or MyChart messaging. [x]   Complex Concerns: If your concern is more complex, scheduling an appointment might be best. Discuss this with the staff to find the most suitable option. [x]   Lab & Imaging Results: We'll contact you directly if results are abnormal or you don't use MyChart. Most normal results will be on MyChart within 2-3 business days, with a review message from Dr. Jesus. Haven't heard back in 2 weeks? Need results sooner? Contact us  at [phone]. [x]   Referrals: Our referral coordinator will manage specialist referrals. The specialist's office should contact you within 2 weeks to schedule an appointment. Call us  if you haven't heard from them after 2 weeks.  Staying Connected [x]   MyChart: Activate your MyChart for the fastest way to access results and message us . See the last page of this paperwork for  instructions on how to activate.  Bring to Your Next Appointment [x]   Medications: Please bring all your medication bottles to your next appointment to ensure we have an accurate record of your prescriptions. [x]   Health Diaries: If you're monitoring any health conditions at home, keeping a diary of your readings can be very helpful for discussions at your next appointment.  Billing [x]   X-ray & Lab Orders: These are billed by separate companies. Contact the invoicing company directly for questions or concerns. [x]   Visit Charges: Discuss any billing inquiries with our administrative services team.  Your Satisfaction Matters [x]   Share Your Experience: We strive for your satisfaction! If you have any complaints, or preferably compliments, please let Dr. Jesus know directly or contact our Practice Administrators, Manuelita Rubin or [employer], by asking at the front desk.   Reviewing Your Records [x]   Review this early draft of your clinical encounter notes below and the final encounter summary tomorrow on MyChart after its been completed.  All orders placed so far are visible here: Post herpetic neuralgia Assessment & Plan: Chronic shingles pain with postherpetic neuralgia affects her chest and face, causing numbness and debilitating pain worsened by stress and fatigue. Chiropractic treatment offers temporary relief. She has concerns about the shingles vaccine due to hemochromatosis and thrombocytosis but is reassured it will not exacerbate shingles. Electroacupuncture is suggested as potentially more effective than regular acupuncture or TENS for pain relief, though insurance coverage is uncertain. Send a prescription for [facility] acid to [facility]. Refer her to an electroacupuncturist and provide a list of licensed acupuncturists, including  [facility]. Encourage continuation of chiropractic treatment and discuss the shingles vaccine with Doctor Gudina.  Orders: -      Alpha-Lipoic Acid; Take 1 capsule (600 mg total) by mouth daily at 6 (six) AM.  Dispense: 90 capsule; Refill: 3 -     Ambulatory referral for Acupuncture  Aortic atherosclerosis (HCC) Assessment & Plan: She has mild to moderate aortic atherosclerosis. Dietary modifications to protect her arteries include increased intake of fish, avocados, and extra virgin olive oil. Statin therapy is discussed but deferred due to current pain management priorities. Add a cholesterol check to future blood work.  Orders: -     Lipid panel; Future  Essential thrombocytosis (HCC) Assessment & Plan: Essential thrombocythemia contributes to her immunocompromised state, potentially exacerbating shingles severity. Defer to hematology   Weight gain          Clinic Name Address Phone Number Notes (if any) References [facility] [address] [place], [place] [phone] Licensed acupuncturist [1-3] [facility] [address] [place], [place] [phone] Experience with pain syndromes [1-3] [facility] [address] [place], [place], [place] [phone] Integrative approach [1-3] [facility] [address] [place], [place], [place] [phone] Hospital-based, MD oversight [1-3] [facility] [address] [place], [place], [place] [phone] Licensed acupuncturist [1-3]  All listed clinics employ licensed acupuncturists, and several have experience with chronic pain and neuropathic syndromes. Electroacupuncture is a standard modality in these practices, and the literature supports its use for postherpetic neuralgia, with evidence for pain reduction and improved quality of life.[1-3] For optimal results, it is recommended to confirm that the practitioner is experienced in treating neuropathic pain and is familiar with electroacupuncture protocols as described in the medical literature.[1-3] 1. Therapeutic Strategies  for Postherpetic Neuralgia: Mechanisms, Treatments, and Perspectives. Tang J, Zhang Y, Liu C, Zeng A, Song L. Current Pain and Headache Reports. 2023;27(9):307-319. doi:10.1007/[id number]-x. 2. Efficacy of Different Acupuncture Therapies on Postherpetic Neuralgia: A Bayesian Network Meta-Analysis. Randa CINDERELLA Zelphia OLEGARIO Lucillie ELINORE, et al. Frontiers in Neuroscience. 2022;16:1056102. doi:10.3389/fnins.[id number]. 3. Acupuncture Therapy for Treating Postherpetic Neuralgia: A Protocol for an Overview of Systematic Reviews and Meta-Analysis. Babara JINNY Ricky CHRISTELLA Robina CINDERELLA, et al. Medicine. 2020;99(47):e23283. doi:10.1097/MD.[id number].

## 2023-11-22 NOTE — Assessment & Plan Note (Signed)
 Chronic shingles pain with postherpetic neuralgia affects her chest and face, causing numbness and debilitating pain worsened by stress and fatigue. Chiropractic treatment offers temporary relief. She has concerns about the shingles vaccine due to hemochromatosis and thrombocytosis but is reassured it will not exacerbate shingles. Electroacupuncture is suggested as potentially more effective than regular acupuncture or TENS for pain relief, though insurance coverage is uncertain. Send a prescription for [facility] lip[facility]ic acid to [facility]. Refer her to an electroacupuncturist and provide a list of licensed acupuncturists, including [facility]. Encourage continuation of chiropractic treatment and discuss the shingles vaccine with Doctor Gudina.

## 2023-11-23 DIAGNOSIS — M9901 Segmental and somatic dysfunction of cervical region: Secondary | ICD-10-CM | POA: Diagnosis not present

## 2023-11-23 DIAGNOSIS — M5031 Other cervical disc degeneration,  high cervical region: Secondary | ICD-10-CM | POA: Diagnosis not present

## 2023-11-30 ENCOUNTER — Ambulatory Visit: Payer: Medicare Other

## 2023-12-07 DIAGNOSIS — M5031 Other cervical disc degeneration,  high cervical region: Secondary | ICD-10-CM | POA: Diagnosis not present

## 2023-12-07 DIAGNOSIS — M9901 Segmental and somatic dysfunction of cervical region: Secondary | ICD-10-CM | POA: Diagnosis not present

## 2023-12-12 ENCOUNTER — Inpatient Hospital Stay: Attending: Hematology and Oncology

## 2023-12-12 ENCOUNTER — Inpatient Hospital Stay (HOSPITAL_BASED_OUTPATIENT_CLINIC_OR_DEPARTMENT_OTHER): Admitting: Hematology and Oncology

## 2023-12-12 VITALS — BP 127/65 | HR 76 | Temp 97.8°F | Resp 17 | Wt 144.4 lb

## 2023-12-12 DIAGNOSIS — B0229 Other postherpetic nervous system involvement: Secondary | ICD-10-CM | POA: Diagnosis not present

## 2023-12-12 DIAGNOSIS — D75838 Other thrombocytosis: Secondary | ICD-10-CM | POA: Diagnosis not present

## 2023-12-12 DIAGNOSIS — D473 Essential (hemorrhagic) thrombocythemia: Secondary | ICD-10-CM

## 2023-12-12 LAB — CBC WITH DIFFERENTIAL (CANCER CENTER ONLY)
Abs Immature Granulocytes: 0.03 K/uL (ref 0.00–0.07)
Basophils Absolute: 0 K/uL (ref 0.0–0.1)
Basophils Relative: 0 %
Eosinophils Absolute: 0.2 K/uL (ref 0.0–0.5)
Eosinophils Relative: 2 %
HCT: 37.3 % (ref 36.0–46.0)
Hemoglobin: 12.5 g/dL (ref 12.0–15.0)
Immature Granulocytes: 0 %
Lymphocytes Relative: 22 %
Lymphs Abs: 1.9 K/uL (ref 0.7–4.0)
MCH: 31.3 pg (ref 26.0–34.0)
MCHC: 33.5 g/dL (ref 30.0–36.0)
MCV: 93.3 fL (ref 80.0–100.0)
Monocytes Absolute: 0.6 K/uL (ref 0.1–1.0)
Monocytes Relative: 7 %
Neutro Abs: 5.9 K/uL (ref 1.7–7.7)
Neutrophils Relative %: 69 %
Platelet Count: 732 K/uL — ABNORMAL HIGH (ref 150–400)
RBC: 4 MIL/uL (ref 3.87–5.11)
RDW: 13.5 % (ref 11.5–15.5)
WBC Count: 8.6 K/uL (ref 4.0–10.5)
nRBC: 0 % (ref 0.0–0.2)

## 2023-12-12 LAB — FERRITIN: Ferritin: 68 ng/mL (ref 11–307)

## 2023-12-12 LAB — IRON AND IRON BINDING CAPACITY (CC-WL,HP ONLY)
Iron: 118 ug/dL (ref 28–170)
Saturation Ratios: 42 % — ABNORMAL HIGH (ref 10.4–31.8)
TIBC: 279 ug/dL (ref 250–450)
UIBC: 161 ug/dL (ref 148–442)

## 2023-12-12 LAB — CMP (CANCER CENTER ONLY)
ALT: 9 U/L (ref 0–44)
AST: 19 U/L (ref 15–41)
Albumin: 4.4 g/dL (ref 3.5–5.0)
Alkaline Phosphatase: 53 U/L (ref 38–126)
Anion gap: 6 (ref 5–15)
BUN: 17 mg/dL (ref 8–23)
CO2: 24 mmol/L (ref 22–32)
Calcium: 9.2 mg/dL (ref 8.9–10.3)
Chloride: 107 mmol/L (ref 98–111)
Creatinine: 0.86 mg/dL (ref 0.44–1.00)
GFR, Estimated: >60 mL/min (ref >60)
Glucose, Bld: 91 mg/dL (ref 70–99)
Potassium: 4.3 mmol/L (ref 3.5–5.1)
Sodium: 137 mmol/L (ref 135–145)
Total Bilirubin: 0.3 mg/dL (ref 0.0–1.2)
Total Protein: 6.6 g/dL (ref 6.5–8.1)

## 2023-12-12 NOTE — Progress Notes (Unsigned)
 Patient Care Team: Jesus Bernardino MATSU, MD as PCP - General (Internal Medicine) Odean Potts, MD as Consulting Physician (Hematology and Oncology) Bensimhon, Toribio SAUNDERS, MD as Consulting Physician (Cardiology) Patel, Donika K, DO as Consulting Physician (Neurology)  DIAGNOSIS:  Encounter Diagnosis  Name Primary?   Essential thrombocytosis (HCC) Yes     CHIEF COMPLIANT: Follow-up of essential thrombocytosis  HISTORY OF PRESENT ILLNESS:   History of Present Illness Kara [name] is a 73 year old female with elevated platelet counts and shingles who presents for follow-up of her platelet levels and shingles management.  Her platelet count is currently 732, up from 648. She missed doses of Jakafi  last week, taking only one pill a day or none on some days. She has since obtained a pill bottle to help manage her medication intake.  She experiences recurrent shingles with significant chest discomfort, worsening in the late afternoon. Ear swelling has returned. Her symptoms worsened after a recent chiropractic visit.  She follows a weight management plan with intermittent fasting and reports a discrepancy between her home scale (132 pounds) and the clinic's scale (144 pounds). 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     ALLERGIES:  is allergic to demerol [meperidine hcl], penicillins, and tramadol.  MEDICATIONS:  Current Outpatient Medications  Medication Sig Dispense Refill   Alpha-Lipoic Acid 600 MG CAPS Take 1 capsule (600 mg total) by mouth daily at 6 (six) AM. 90 capsule 3   aspirin  EC 81 MG tablet Take 1 tablet (81 mg total) by mouth daily.     ruxolitinib  phosphate (JAKAFI ) 5 MG tablet Take 1 tablet (5 mg total) by mouth 2 (two) times daily. 60 tablet 3   Current Facility-Administered Medications  Medication Dose Route Frequency Provider Last Rate Last Admin   0.9 %  sodium chloride  infusion  500 mL Intravenous  Once Armbruster, Elspeth SQUIBB, MD        PHYSICAL EXAMINATION: ECOG PERFORMANCE STATUS: 1 - Symptomatic but completely ambulatory  Vitals:   12/12/23 1453  BP: 127/65  Pulse: 76  Resp: 17  Temp: 97.8 F (36.6 C)  SpO2: 95%   Filed Weights   12/12/23 1453  Weight: 144 lb 6.4 oz (65.5 kg)    Physical Exam MEASUREMENTS: Weight- 144.  (exam performed in the presence of a chaperone)  LABORATORY DATA:  I have reviewed the data as listed    Latest Ref Rng & Units 08/10/2023   10:18 AM 06/28/2023   10:13 AM 05/31/2023    9:08 AM  CMP  Glucose 70 - 99 mg/dL 87  98  93   BUN 8 - 23 mg/dL 17  17  17    Creatinine 0.44 - 1.00 mg/dL 9.37  9.35  9.34   Sodium 135 - 145 mmol/L 139  141  139   Potassium 3.5 - 5.1 mmol/L 4.1  4.2  3.7   Chloride 98 - 111 mmol/L 105  107  106   CO2 22 - 32 mmol/L 28  27  26    Calcium 8.9 - 10.3 mg/dL 9.7  9.2  9.4   Total Protein 6.5 - 8.1 g/dL 7.3  7.0  7.4   Total Bilirubin 0.0 - 1.2 mg/dL 0.4  0.4  0.4   Alkaline Phos 38 - 126 U/L 47  59  69   AST 15 - 41 U/L 24  18  18    ALT 0 - 44 U/L 13  9  10     Lab Results  Component Value Date   WBC 8.6 12/12/2023   HGB 12.5 12/12/2023   HCT 37.3 12/12/2023   MCV 93.3 12/12/2023   PLT 732 (H) 12/12/2023   NEUTROABS 5.9 12/12/2023    ASSESSMENT & PLAN:  Essential thrombocytosis (HCC) 06/18/21:  Hb 11.2, Platelet 778 07/30/2021: Hemoglobin 12.9, platelets 783, iron saturation 13%, ferritin 7  06/28/2023: Platelets 668, iron saturation 52%, ferritin 16 10/11/2024: Platelets 648, iron saturation 56%, ferritin 59 12/12/2023: Platelets 732, iron saturation 42%, ferritin 68 (fluctuation platelet count felt to be reactive to underlying inflammation from shingles)    Current treatment: Since the patient is intolerant to anagrelide  and hydroxyurea , currently on Jakafi  at the low-dose of 5 mg twice a day. Started 09/18/2022   Jakafi  toxicities: Denies any major adverse effects to Jakafi  Facial numbness: Saw neurology  Dr.Patel: MRI brain: 08/28/2023: No acute findings. Pain on skull and neck: went to chiropractor: Dr.Spell did alignment of skull and pain has resolved. Insomnia: intermittent   Relapse shingles episodes: Waxing and waning symptoms She went on a diet and lost 10 pounds. Hemochromatosis: On periodic phlebotomies. Does not need a phlebotomy at this time. Depression: slight improvement  Return to clinic in 8 weeks with labs and follow-up ------------------------------------- Assessment and Plan Assessment & Plan Essential thrombocythemia with reactive thrombocytosis Platelet count increased to 732 from 648, potentially due to inflammation from shingles and missed Jakafi  doses. Other blood parameters stable. Liver function and iron levels pending. - Continue Ruxolitinib  phosphate (Jakafi ) 5 mg oral bid. - Monitor platelet count and inflammation levels. - Review liver function and iron levels when available.  Postherpetic neuralgia Persistent neuralgia with significant discomfort, especially in the chest, worsening in the late afternoon, affecting weight management. Previous neurologist consultation unhelpful. Inflammation from shingles may contribute to elevated platelet count. - Consider acupuncture with Rushie Ruth for postherpetic neuralgia. - Continue managing inflammation.      No orders of the defined types were placed in this encounter.  The patient has a good understanding of the overall plan. she agrees with it. she will call with any problems that may develop before the next visit here. Total time spent: 30 mins including face to face time and time spent for planning, charting and co-ordination of care   Naomi MARLA Chad, MD 12/12/23

## 2023-12-12 NOTE — Assessment & Plan Note (Signed)
 06/18/21:  Hb 11.2, Platelet 778 07/30/2021: Hemoglobin 12.9, platelets 783, iron saturation 13%, ferritin 7  06/28/2023: Platelets 668, iron saturation 52%, ferritin 16 10/11/2024: Platelets 648, iron saturation 56%, ferritin 59    Current treatment: Since the patient is intolerant to anagrelide  and hydroxyurea , currently on Jakafi  at the low-dose of 5 mg twice a day. Started 09/18/2022   Jakafi  toxicities: Denies any major adverse effects to Jakafi  Facial numbness: Saw neurology Dr.Patel: MRI brain: 08/28/2023: No acute findings. Pain on skull and neck: went to chiropractor: Dr.Spell did alignment of skull and pain has resolved. Insomnia: intermittent   Relapse shingles episodes: Waxing and waning symptoms She went on a diet and lost 10 pounds. Hemochromatosis: On periodic phlebotomies. Does not need a phlebotomy at this time. Depression: slight improvement  Return to clinic in 8 weeks with labs and follow-up

## 2023-12-13 ENCOUNTER — Encounter: Payer: Self-pay | Admitting: Hematology and Oncology

## 2023-12-14 DIAGNOSIS — M5031 Other cervical disc degeneration,  high cervical region: Secondary | ICD-10-CM | POA: Diagnosis not present

## 2023-12-14 DIAGNOSIS — M9901 Segmental and somatic dysfunction of cervical region: Secondary | ICD-10-CM | POA: Diagnosis not present

## 2023-12-20 DIAGNOSIS — M9901 Segmental and somatic dysfunction of cervical region: Secondary | ICD-10-CM | POA: Diagnosis not present

## 2023-12-20 DIAGNOSIS — M5031 Other cervical disc degeneration,  high cervical region: Secondary | ICD-10-CM | POA: Diagnosis not present

## 2023-12-28 ENCOUNTER — Other Ambulatory Visit: Payer: Self-pay | Admitting: *Deleted

## 2023-12-28 DIAGNOSIS — M5031 Other cervical disc degeneration,  high cervical region: Secondary | ICD-10-CM | POA: Diagnosis not present

## 2023-12-28 DIAGNOSIS — M9901 Segmental and somatic dysfunction of cervical region: Secondary | ICD-10-CM | POA: Diagnosis not present

## 2023-12-28 MED ORDER — RUXOLITINIB PHOSPHATE 5 MG PO TABS: 5.0000 mg | ORAL_TABLET | Freq: Two times a day (BID) | ORAL | 3 refills | Status: DC

## 2024-01-01 ENCOUNTER — Telehealth: Payer: Self-pay | Admitting: *Deleted

## 2024-01-01 ENCOUNTER — Other Ambulatory Visit: Payer: Self-pay | Admitting: *Deleted

## 2024-01-01 DIAGNOSIS — M7989 Other specified soft tissue disorders: Secondary | ICD-10-CM

## 2024-01-01 NOTE — Telephone Encounter (Signed)
 Patient called to report since Thursday swelling to the right instep with pain.  Denies any heat or redness.  Unable to walk on the foot.  She has history shingles, unsure if related.  She doesn't know if phlebotomy would help or is this completely unrelated.  Concerned for DVT due to polycythemia vera.    Seeking guidance from provider on how to proceed with evaluation for new foot pain.

## 2024-01-01 NOTE — Progress Notes (Signed)
 Per Dr Odean order US  to lower ext of foot.  If negative then do xray of foot and cbc due to hx of essential thrombocytosis/hemochromatosis.

## 2024-01-02 ENCOUNTER — Inpatient Hospital Stay: Attending: Hematology and Oncology

## 2024-01-02 ENCOUNTER — Ambulatory Visit (HOSPITAL_COMMUNITY)
Admission: RE | Admit: 2024-01-02 | Discharge: 2024-01-02 | Disposition: A | Discharge status: Home / Self Care | Source: Ambulatory Visit | Attending: Hematology and Oncology | Admitting: Hematology and Oncology

## 2024-01-02 ENCOUNTER — Other Ambulatory Visit: Payer: Self-pay | Admitting: *Deleted

## 2024-01-02 DIAGNOSIS — D75839 Thrombocytosis, unspecified: Secondary | ICD-10-CM | POA: Diagnosis not present

## 2024-01-02 DIAGNOSIS — D45 Polycythemia vera: Secondary | ICD-10-CM | POA: Insufficient documentation

## 2024-01-02 DIAGNOSIS — M79671 Pain in right foot: Secondary | ICD-10-CM

## 2024-01-02 DIAGNOSIS — D473 Essential (hemorrhagic) thrombocythemia: Secondary | ICD-10-CM | POA: Diagnosis not present

## 2024-01-02 DIAGNOSIS — G4700 Insomnia, unspecified: Secondary | ICD-10-CM | POA: Diagnosis not present

## 2024-01-02 DIAGNOSIS — M19071 Primary osteoarthritis, right ankle and foot: Secondary | ICD-10-CM | POA: Diagnosis not present

## 2024-01-02 DIAGNOSIS — R600 Localized edema: Secondary | ICD-10-CM | POA: Insufficient documentation

## 2024-01-02 DIAGNOSIS — B0229 Other postherpetic nervous system involvement: Secondary | ICD-10-CM | POA: Diagnosis not present

## 2024-01-02 DIAGNOSIS — R200 Anesthesia of skin: Secondary | ICD-10-CM | POA: Insufficient documentation

## 2024-01-02 DIAGNOSIS — M7989 Other specified soft tissue disorders: Secondary | ICD-10-CM | POA: Diagnosis not present

## 2024-01-02 LAB — CMP (CANCER CENTER ONLY)
ALT: 14 U/L (ref 0–44)
AST: 20 U/L (ref 15–41)
Albumin: 4.8 g/dL (ref 3.5–5.0)
Alkaline Phosphatase: 71 U/L (ref 38–126)
Anion gap: 6 (ref 5–15)
BUN: 16 mg/dL (ref 8–23)
CO2: 27 mmol/L (ref 22–32)
Calcium: 9.4 mg/dL (ref 8.9–10.3)
Chloride: 107 mmol/L (ref 98–111)
Creatinine: 0.65 mg/dL (ref 0.44–1.00)
GFR, Estimated: >60 mL/min (ref >60)
Glucose, Bld: 103 mg/dL — ABNORMAL HIGH (ref 70–99)
Potassium: 4.1 mmol/L (ref 3.5–5.1)
Sodium: 140 mmol/L (ref 135–145)
Total Bilirubin: 0.3 mg/dL (ref 0.0–1.2)
Total Protein: 7.3 g/dL (ref 6.5–8.1)

## 2024-01-02 LAB — IRON AND IRON BINDING CAPACITY (CC-WL,HP ONLY)
Iron: 123 ug/dL (ref 28–170)
Saturation Ratios: 42 % — ABNORMAL HIGH (ref 10.4–31.8)
TIBC: 294 ug/dL (ref 250–450)
UIBC: 171 ug/dL

## 2024-01-02 LAB — CBC WITH DIFFERENTIAL (CANCER CENTER ONLY)
Abs Immature Granulocytes: 0.02 K/uL (ref 0.00–0.07)
Basophils Absolute: 0 K/uL (ref 0.0–0.1)
Basophils Relative: 0 %
Eosinophils Absolute: 0.3 K/uL (ref 0.0–0.5)
Eosinophils Relative: 3 %
HCT: 38.8 % (ref 36.0–46.0)
Hemoglobin: 13 g/dL (ref 12.0–15.0)
Immature Granulocytes: 0 %
Lymphocytes Relative: 25 %
Lymphs Abs: 2.2 K/uL (ref 0.7–4.0)
MCH: 31.6 pg (ref 26.0–34.0)
MCHC: 33.5 g/dL (ref 30.0–36.0)
MCV: 94.4 fL (ref 80.0–100.0)
Monocytes Absolute: 0.7 K/uL (ref 0.1–1.0)
Monocytes Relative: 7 %
Neutro Abs: 5.8 K/uL (ref 1.7–7.7)
Neutrophils Relative %: 65 %
Platelet Count: 746 K/uL — ABNORMAL HIGH (ref 150–400)
RBC: 4.11 MIL/uL (ref 3.87–5.11)
RDW: 14.1 % (ref 11.5–15.5)
WBC Count: 8.9 K/uL (ref 4.0–10.5)
nRBC: 0 % (ref 0.0–0.2)

## 2024-01-02 NOTE — Progress Notes (Signed)
 Pt vas US  right leg negative for DVT.  Per MD pt needing xray of right foot and lab work with MD f/u.  Appts orders placed, appt scheduled.  Pt notified and verbalized understanding.

## 2024-01-03 ENCOUNTER — Inpatient Hospital Stay: Admitting: Hematology and Oncology

## 2024-01-03 ENCOUNTER — Other Ambulatory Visit: Payer: Self-pay | Admitting: Surgery

## 2024-01-03 DIAGNOSIS — D473 Essential (hemorrhagic) thrombocythemia: Secondary | ICD-10-CM | POA: Diagnosis not present

## 2024-01-03 DIAGNOSIS — D75839 Thrombocytosis, unspecified: Secondary | ICD-10-CM

## 2024-01-03 DIAGNOSIS — D45 Polycythemia vera: Secondary | ICD-10-CM

## 2024-01-03 DIAGNOSIS — I712 Thoracic aortic aneurysm, without rupture, unspecified: Secondary | ICD-10-CM

## 2024-01-03 DIAGNOSIS — B029 Zoster without complications: Secondary | ICD-10-CM | POA: Diagnosis not present

## 2024-01-03 DIAGNOSIS — M7989 Other specified soft tissue disorders: Secondary | ICD-10-CM

## 2024-01-03 DIAGNOSIS — B0229 Other postherpetic nervous system involvement: Secondary | ICD-10-CM | POA: Diagnosis not present

## 2024-01-03 DIAGNOSIS — Z79899 Other long term (current) drug therapy: Secondary | ICD-10-CM

## 2024-01-03 LAB — FERRITIN: Ferritin: 65 ng/mL (ref 11–307)

## 2024-01-03 NOTE — Progress Notes (Signed)
 HEMATOLOGY-ONCOLOGY TELEPHONE VISIT PROGRESS NOTE  I connected with our patient on 01/03/24 at  3:30 PM EDT by telephone and verified that I am speaking with the correct person using two identifiers.  I discussed the limitations, risks, security and privacy concerns of performing an evaluation and management service by telephone and the availability of in person appointments.  I also discussed with the patient that there may be a patient responsible charge related to this service. The patient expressed understanding and agreed to proceed.   History of Present Illness:   History of Present Illness Kara [name] is a 73 year old female with polycythemia vera who presents with right foot swelling and worsening shingles symptoms.  She has experienced right foot swelling for several months, which worsened despite a chiropractic adjustment and increased aspirin  intake. There is no associated heat or itching, and she can walk on it better, although it remains swollen.  Her shingles symptoms have worsened, with headaches and pain moving from above her breast to under her armpit, similar to previous rash locations. Tingling and numbness on her face have returned.  She is taking Jakafi  for polycythemia vera and was on the Fast 800 diet until her symptoms worsened. Her glucose levels were slightly elevated, but she does not consider herself diabetic.  REVIEW OF SYSTEMS:   Constitutional: Denies fevers, chills or abnormal weight loss All other systems were reviewed with the patient and are negative. Observations/Objective:     Assessment Plan:  Essential thrombocytosis (HCC) 06/18/21:  Hb 11.2, Platelet 778 07/30/2021: Hemoglobin 12.9, platelets 783, iron saturation 13%, ferritin 7  06/28/2023: Platelets 668, iron saturation 52%, ferritin 16 10/11/2024: Platelets 648, iron saturation 56%, ferritin 59 12/12/2023: Platelets 732, iron saturation 42%, ferritin 68   01/02/24: Platelets 746, Iron sat: 42%,  Ferritin: 65    Current treatment: Since the patient is intolerant to anagrelide  and hydroxyurea , currently on Jakafi  at the low-dose of 5 mg twice a day. Started 09/18/2022   Jakafi  toxicities: Denies any major adverse effects to Jakafi  Facial numbness: Saw neurology Dr.Patel: MRI brain: 08/28/2023: No acute findings. Pain on skull and neck: went to chiropractor: Dr.Spell did alignment of skull and pain has resolved. Insomnia: intermittent   Relapse shingles episodes: Waxing and waning symptoms She went on a diet and lost 10 pounds. Hemochromatosis: On periodic phlebotomies.    Right foot swelling:  01/03/2024: X-ray: No bony abnormality. 01/03/2024: Ultrasound right leg: No evidence of DVT    Recommend: Phlebotomy X 1  Return to clinic 02/13/2024 with labs and follow-up Assessment & Plan Polycythemia vera with right foot and ankle edema Right foot and ankle edema potentially related to polycythemia vera. Differential includes Ruxolitinib  side effects, though sudden onset is atypical. No clotting evidence. Possible immune response increase from recent acupuncture and chiropractic adjustments. Glucose slightly elevated, ferritin and saturation controlled. - Perform one phlebotomy to reduce hematocrit and assess edema impact. - Advise compression socks for edema management. - Instruct to elevate foot when sitting. - Schedule follow-up appointment.  Postherpetic neuralgia Symptoms worsened, possibly triggered by recent acupuncture and chiropractic adjustments. Includes headaches, pain from above breast to under armpit, and facial tingling and numbness. No rash. Potential exacerbation due to Ruxolitinib  increasing persistent shingles risk. - Discontinue acupuncture sessions temporarily.      I discussed the assessment and treatment plan with the patient. The patient was provided an opportunity to ask questions and all were answered. The patient agreed with the plan and demonstrated an  understanding of the  instructions. The patient was advised to call back or seek an in-person evaluation if the symptoms worsen or if the condition fails to improve as anticipated.   I provided 20 minutes of non-face-to-face time during this encounter.  This includes time for charting and coordination of care   Kara MARLA Chad, MD

## 2024-01-03 NOTE — Assessment & Plan Note (Signed)
 06/18/21:  Hb 11.2, Platelet 778 07/30/2021: Hemoglobin 12.9, platelets 783, iron saturation 13%, ferritin 7  06/28/2023: Platelets 668, iron saturation 52%, ferritin 16 10/11/2024: Platelets 648, iron saturation 56%, ferritin 59 12/12/2023: Platelets 732, iron saturation 42%, ferritin 68 (fluctuation platelet count felt to be reactive to underlying inflammation from shingles)    Current treatment: Since the patient is intolerant to anagrelide  and hydroxyurea , currently on Jakafi  at the low-dose of 5 mg twice a day. Started 09/18/2022   Jakafi  toxicities: Denies any major adverse effects to Jakafi  Facial numbness: Saw neurology Dr.Patel: MRI brain: 08/28/2023: No acute findings. Pain on skull and neck: went to chiropractor: Dr.Spell did alignment of skull and pain has resolved. Insomnia: intermittent   Relapse shingles episodes: Waxing and waning symptoms She went on a diet and lost 10 pounds. Hemochromatosis: On periodic phlebotomies.    Right foot swelling:  01/03/2024: X-ray: No bony abnormality. 01/03/2024: Ultrasound right leg: No evidence of DVT   Return to clinic in 8 weeks with labs and follow-up

## 2024-01-04 DIAGNOSIS — M5031 Other cervical disc degeneration,  high cervical region: Secondary | ICD-10-CM | POA: Diagnosis not present

## 2024-01-04 DIAGNOSIS — M9901 Segmental and somatic dysfunction of cervical region: Secondary | ICD-10-CM | POA: Diagnosis not present

## 2024-01-08 ENCOUNTER — Telehealth: Payer: Self-pay | Admitting: *Deleted

## 2024-01-08 ENCOUNTER — Ambulatory Visit

## 2024-01-08 NOTE — Telephone Encounter (Signed)
 Patient called asking for Dr Gara recommendation.  She states that her previous shingle locations are exacerbated (no rash) just pain.  She is asking if she could benefit from getting the shingle vaccine (if it would stop any further episodes) or if she should be put on a antiviral for this episode.  She was previously prescribed daily dose by Dr Jesus but did not pick that prescription up as she didn't want to be put on a daily medication potentially for the rest of her life that could decrease her immune system.    Routed to Dr Gudena

## 2024-01-09 ENCOUNTER — Inpatient Hospital Stay

## 2024-01-09 DIAGNOSIS — D45 Polycythemia vera: Secondary | ICD-10-CM | POA: Diagnosis not present

## 2024-01-09 DIAGNOSIS — D473 Essential (hemorrhagic) thrombocythemia: Secondary | ICD-10-CM | POA: Diagnosis not present

## 2024-01-09 DIAGNOSIS — D75839 Thrombocytosis, unspecified: Secondary | ICD-10-CM | POA: Diagnosis not present

## 2024-01-09 DIAGNOSIS — G4700 Insomnia, unspecified: Secondary | ICD-10-CM | POA: Diagnosis not present

## 2024-01-09 DIAGNOSIS — R200 Anesthesia of skin: Secondary | ICD-10-CM | POA: Diagnosis not present

## 2024-01-09 NOTE — Progress Notes (Signed)
 Kara [name] presents today for phlebotomy per MD orders. Phlebotomy procedure started at 1534 and ended at 1545. 523 grams removed. 16 G phlebotomy kit to the right AC was utilized. Patient observed for 30 minutes after procedure without any incident. Patient tolerated procedure well. IV needle removed intact.

## 2024-01-09 NOTE — Patient Instructions (Signed)

## 2024-01-11 ENCOUNTER — Telehealth: Payer: Self-pay

## 2024-01-11 DIAGNOSIS — M9904 Segmental and somatic dysfunction of sacral region: Secondary | ICD-10-CM | POA: Diagnosis not present

## 2024-01-11 DIAGNOSIS — M9903 Segmental and somatic dysfunction of lumbar region: Secondary | ICD-10-CM | POA: Diagnosis not present

## 2024-01-11 DIAGNOSIS — M9905 Segmental and somatic dysfunction of pelvic region: Secondary | ICD-10-CM | POA: Diagnosis not present

## 2024-01-11 DIAGNOSIS — M51361 Other intervertebral disc degeneration, lumbar region with lower extremity pain only: Secondary | ICD-10-CM | POA: Diagnosis not present

## 2024-01-11 NOTE — Telephone Encounter (Signed)
 Copied from CRM #[id number]. Topic: Clinical - Medical Advice >> Jan 10, 2024  3:22 PM Terri G wrote: Reason for CRM: Patient wants to speak Dr.Morrison assistant Tiffany, called [facility] and they said she is no longer there. So patient would like to speak to his assistant Nicki. Callback number [phone]  Spoke with pt about wanting lab test she would like to be tested in her blood for shingles she would like for pcp to place order to be tested for shingle in her blood. I advise pt that I would send her provider message and let her know what he decides.

## 2024-01-11 NOTE — Telephone Encounter (Signed)
 Tried to call pt about blood work for shingles provider stated there is no blood test that can be done for shingles if any other concerns to call office back for appt.

## 2024-01-11 NOTE — Telephone Encounter (Signed)
 Spoke back to pt she states she will look up the test to let provider know because she know there are test that can be done for her to be check per pt.

## 2024-01-12 ENCOUNTER — Telehealth: Payer: Self-pay

## 2024-01-12 NOTE — Telephone Encounter (Signed)
 noted

## 2024-01-12 NOTE — Telephone Encounter (Signed)
 Copied from CRM #[id number]. Topic: Clinical - Request for Lab/Test Order >> Jan 12, 2024  1:13 PM Franky GRADE wrote: Reason for CRM: Patient has double checked and there is a blood marker for shingles, Patient would like to have labs done. Patient would like to speak with Dr.Morrison's medical assistant.  Spoke with pt via phone about testing she stated she found blood marker test to be done she has VZV titer testing she would like to have done before coming in for appt one the results come back pt would like to come in to see provider. Even if its positive or negative because states she is not having a lot of energy and a lot of different things going on with her body. I did advise her she needed appt but she stated she would like testing first then appt. I did tell her I would write note in her chart for provider to review and would call her back to let her know the next step on what to do.

## 2024-01-15 ENCOUNTER — Telehealth: Payer: Self-pay

## 2024-01-15 NOTE — Telephone Encounter (Signed)
 Copied from CRM [id number]. Topic: General - Other >> Jan 15, 2024  1:34 PM Rea ORN wrote: Reason for CRM: pt called to speak with [facility]. Pt stated it is in regards to what she has previously spoken to hear about, blood work and seeing Dr. Jesus. Pt declined my further assistance. Please call back.  Spoke to pt about blood work pt will make appt with front desk

## 2024-01-15 NOTE — Telephone Encounter (Signed)
 Spoke with pt about labs she is wanting she stated she does not understand why she needs appt before being tested I did advise her that is what her provider stated. Pt know she needs appt. Sent message to front office to call pt to make appt.

## 2024-01-15 NOTE — Telephone Encounter (Addendum)
 S/w patient regarding Dr. Gara recommendations to receive the shingles vaccine. Patient states that she is holding off on receiving it at this time until she confirms whether or not she has ever had shingles or chicken pox previously. Patient is working with her PCP at this time to determine this. Patient knows to reach out to our office should she have any additional questions or concerns.

## 2024-01-15 NOTE — Telephone Encounter (Signed)
 Copied from CRM #[id number]. Topic: Appointments - Transfer of Care >> Jan 15, 2024  4:27 PM Shereese L wrote: Pt is requesting to transfer FROM: Dr. Jesus  Pt is requesting to transfer TO: Dr. Gayle Reason for requested transfer: patient request It is the responsibility of the team the patient would like to transfer to (Dr. Jesus) to reach out to the patient if for any reason this transfer is not acceptable.

## 2024-01-15 NOTE — Telephone Encounter (Deleted)
 S/w patient regarding Dr. Gara recommendation to receive shingles vaccine.  Patient states that she is unsure if she ever had shingles. She is speaking with her PCP to verify this. She will hold off on receiving the vaccine until she is clarify

## 2024-01-16 NOTE — Telephone Encounter (Unsigned)
 Copied from CRM #[id number]. Topic: Appointments - Appointment Scheduling >> Jan 16, 2024  2:41 PM Berneda FALCON wrote: Patient/patient representative is calling to schedule an appointment. Patient would like to know why Dr. Jesus would like to see her before ordering the blood marker for her shingles test and if so, if we could get her in sooner because first showing is 10/24 and she thinks this is much time and she has already been waiting a week. She has been doing blood work with [facility] almost every month for 10 years for blood disorders and is frustrated that Dr. Marinell would like to see her before ordering this test for her. If so, can we please get her in sooner.  Patient callback is [phone]

## 2024-01-17 ENCOUNTER — Encounter: Payer: Self-pay | Admitting: Internal Medicine

## 2024-01-17 ENCOUNTER — Ambulatory Visit (INDEPENDENT_AMBULATORY_CARE_PROVIDER_SITE_OTHER): Admitting: Internal Medicine

## 2024-01-17 VITALS — BP 120/70 | HR 88 | Temp 98.2°F | Ht 61.0 in | Wt 141.8 lb

## 2024-01-17 DIAGNOSIS — B0229 Other postherpetic nervous system involvement: Secondary | ICD-10-CM

## 2024-01-17 DIAGNOSIS — H539 Unspecified visual disturbance: Secondary | ICD-10-CM | POA: Diagnosis not present

## 2024-01-17 DIAGNOSIS — Z23 Encounter for immunization: Secondary | ICD-10-CM | POA: Diagnosis not present

## 2024-01-17 DIAGNOSIS — B020 Zoster encephalitis: Secondary | ICD-10-CM

## 2024-01-17 DIAGNOSIS — D849 Immunodeficiency, unspecified: Secondary | ICD-10-CM | POA: Diagnosis not present

## 2024-01-17 DIAGNOSIS — B028 Zoster with other complications: Secondary | ICD-10-CM

## 2024-01-17 DIAGNOSIS — T50905A Adverse effect of unspecified drugs, medicaments and biological substances, initial encounter: Secondary | ICD-10-CM | POA: Diagnosis not present

## 2024-01-17 MED ORDER — VALACYCLOVIR HCL 1 G PO TABS: 1000.0000 mg | ORAL_TABLET | Freq: Two times a day (BID) | ORAL | 0 refills | Status: AC

## 2024-01-17 NOTE — Assessment & Plan Note (Signed)
 The differential for her symptom(s) is broad but the best fit with current data is:  Disseminated shingles with possible encephalopathy and postherpetic neuralgia   Chronic disseminated shingles with potential encephalopathy and postherpetic neuralgia presents with persistent pain, circadian rhythm disruption, and possible brain involvement. Differential diagnosis includes Jakafi  side effects, with immunosuppression potentially exacerbating the condition (immunosuppressively preventing resolution of disseminated zoster). Clinical suspicion of mild but disseminated zoster with brain involvement exists. Start Valacyclovir  to manage shingles. Order blood tests for shingles antibodies, though results may not be definitive. Discuss potential insurance coverage for tests with front desk staff. Consider stopping Jakafi  if the condition worsens.  Vision changes under evaluation for possible zoster ophthalmicus   Vision changes may relate to zoster ophthalmicus, with symptoms of vision blurriness and concern for optic nerve involvement. Urgent evaluation is needed to rule out ocular involvement. Refer urgently to a female ophthalmologist to evaluate for zoster ophthalmicus. If shingles are found on the retina, consider additional antivirals.  Polycythemia vera   Polycythemia vera is managed with regular follow-ups. There is a potential interaction with shingles and Jakafi  treatment.  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 Consider stopping Jakafi  if the condition worsens.  Circadian rhythm sleep disorder   Circadian rhythm sleep disorder may relate to shingles or Jakafi  side effects, with symptoms of a flipped sleep-wake cycle and difficulty maintaining sleep. Monitor sleep pattern changes and assess improvement with shingles treatment.

## 2024-01-17 NOTE — Progress Notes (Signed)
 ==============================  [facility]: [phone]   -- Medical Office Visit --  Patient: Kara [name]      Age: 73 y.o.       Sex:  female  Date:   01/17/2024 Today's Healthcare Provider: Bernardino KANDICE Cone, MD  ==============================   Chief Complaint: Discuss bloodwork (Pt is requesting to have labs to check for shingles in the past and if still has .)  Discussed the use of AI scribe software for clinical note transcription with the patient, who gave verbal consent to proceed.  History of Present Illness 73 year old female with polycythemia vera and hemochromatosis who presents with concerns about shingles and post-herpetic neuralgia.  She experienced a rash on May 29, 2022, initially diagnosed as shingles at an urgent care visit. The rash, approximately an inch by an inch, was located on her body. She was prescribed an antiviral medication and advised to bandage the area. Despite treatment, the rash expanded slightly and was very painful, with associated symptoms such as tingling and a general feeling of being unwell. The rash eventually resolved, leaving scarring, but she continues to experience post-herpetic pain.  The pain radiates from the original rash site and, until recently, would worsen in the late afternoon, but now begins in the morning. Recently, she experienced a shift in her circadian rhythm, with difficulty sleeping at night and sleeping during the day. This change coincided with an increase in her post-herpetic pain symptoms, which now begin in the morning. She also reports vision changes, including fuzziness, which she associates with the shingles.  She has a history of polycythemia vera and hemochromatosis, for which she regularly sees a hematologist. She is currently taking Jakafi , which she started in June 2024, and has noted that some of her symptoms overlap with the side effects of this medication. She cannot  tolerate anagrelide  or hydroxyurea . She has tried gabapentin  for pain, but it worsened her symptoms, and she found some relief with chiropractic care and over-the-counter cortisone cream.  She is concerned about the potential for shingles to affect her optic nerve, especially given her recent vision changes. She has seen a neurologist in the past but was dissatisfied with the care received. She is seeking further evaluation to determine if her symptoms are related to shingles or another condition.  Suspected shingles 05/30/2023 with tiny rash with still pain on right chest.   It went away and it scarred after.  She was given antiviral and bandaged it.  It lasted over 6 weeks.  She following with hematology for polycytemia vera and hemochromatosis.   Mostly pain and tingling at site persistent although also not feeling great. The hematologist tried an antibiotic because they didn't feel like it looked like shingles but that didn't help at all.  Eventually the rash dissipated into a scar- ever since then pain has behaved like postherpetic pain.   She also takes jakafi . Recently circadian rhythm flipped, the shingles pain and sleep flipped together meaning the time of day the shingles pain flared.  She has ongoing eye symptoms, sleeping symptoms, malaise, reversed circadian rhythm.  Background Reviewed: Problem List: has Hereditary hemochromatosis; Essential thrombocytosis (HCC); Nasal obstruction; Facial numbness; Post herpetic neuralgia; History of smoking 25-50 pack years; Night sweats; Intrinsic aging of facial skin; Skin rash; History of colon polyps; Emphysema lung (HCC); Aortic aneurysm, thoracic; Lung nodules; Aortic atherosclerosis; Abnormal mammogram of both breasts; Herpes zoster with complication; Adverse drug interaction with prescription medication; Immunosuppression; Herpes zoster encephalitis;  and Vision changes on their problem list. Past Medical History:  has a past medical history of Allergy  (Unknown), Anemia (Unknown), Aneurysm of ascending aorta without rupture, Aortic atherosclerosis, Cataract (One month), Clotting disorder (Unknown), Essential thrombocytosis (HCC), Hemochromatosis, History of smoking 25-50 pack years, Hypertension (Unknown), Lung nodule, Orthostatic hypotension (05/20/2016), Other emphysema (HCC), and Sleep apnea (Unknown). Past Surgical History:   has a past surgical history that includes Nasal septum surgery; Cosmetic surgery (Mar 07 2022); Breast biopsy (Left, 12/29/2022); Breast biopsy (Right, 12/29/2022); and Breast biopsy (Right, 12/29/2022). Social History:   reports that she quit smoking about 3 years ago. Her smoking use included cigarettes. She started smoking about 33 years ago. She has a 60 pack-year smoking history. She has never used smokeless tobacco. She reports that she does not currently use alcohol. She reports that she does not use drugs. Family History:  family history includes Acute myelogenous leukemia in her father; Arthritis in her mother; Bipolar disorder in her sister; Yvone' disease in her sister; Heart attack in her maternal grandfather; Heart disease in her paternal grandfather, paternal grandmother, and sister; Stroke in her maternal grandmother. Allergies:  is allergic to demerol [meperidine hcl], penicillins, and tramadol.   Medication Reconciliation: Current Outpatient Medications on File Prior to Visit  Medication Sig   aspirin  EC 81 MG tablet Take 1 tablet (81 mg total) by mouth daily.   ruxolitinib  phosphate (JAKAFI ) 5 MG tablet Take 1 tablet (5 mg total) by mouth 2 (two) times daily.   Alpha-Lipoic Acid 600 MG CAPS Take 1 capsule (600 mg total) by mouth daily at 6 (six) AM.   Current Facility-Administered Medications on File Prior to Visit  Medication   0.9 %  sodium chloride  infusion  There are no discontinued medications.   Physical Exam:    01/17/2024    3:27 PM 01/09/2024    4:15 PM 01/09/2024    3:24 PM  Vitals with BMI   Height 5' 1    Weight 141 lbs 13 oz    BMI 26.81    Systolic 120 112 868  Diastolic 70 66 75  Pulse 88 76 78  Vital signs reviewed.  Nursing notes reviewed. Weight trend reviewed. Physical Activity: Inactive (12/22/2022)   Exercise Vital Sign    Days of Exercise per Week: 0 days    Minutes of Exercise per Session: 0 min   General Appearance:  No acute distress appreciable.   Well-groomed, healthy-appearing female.  Well proportioned with no abnormal fat distribution.  Good muscle tone. Pulmonary:  Normal work of breathing at rest, no respiratory distress apparent. SpO2: 98 %  Musculoskeletal: All extremities are intact.  Neurological:  Awake, alert, oriented, and engaged.  No obvious focal neurological deficits or cognitive impairments.  Sensorium seems unclouded.   Speech is clear and coherent with logical content. Psychiatric:  Appropriate mood, pleasant and cooperative demeanor, thoughtful and engaged during the exam she does seem to feel like something might be seriously wrong with her, but struggles to articulate it even though cognitively she seems sharp.     04/05/2023    1:58 PM 11/24/2022    8:06 AM 09/26/2022    2:19 PM  PHQ 2/9 Scores  PHQ - 2 Score 0 0 0   Appointment on 01/02/2024  Component Date Value Ref Range Status   Sodium 01/02/2024 140  135 - 145 mmol/L Final   Potassium 01/02/2024 4.1  3.5 - 5.1 mmol/L Final   Chloride 01/02/2024 107  98 - 111  mmol/L Final   CO2 01/02/2024 27  22 - 32 mmol/L Final   Glucose, Bld 01/02/2024 103 (H)  70 - 99 mg/dL Final   BUN 90/83/7974 16  8 - 23 mg/dL Final   Creatinine 90/83/7974 0.65  0.44 - 1.00 mg/dL Final   Calcium 90/83/7974 9.4  8.9 - 10.3 mg/dL Final   Total Protein 90/83/7974 7.3  6.5 - 8.1 g/dL Final   Albumin 90/83/7974 4.8  3.5 - 5.0 g/dL Final   AST 90/83/7974 20  15 - 41 U/L Final   ALT 01/02/2024 14  0 - 44 U/L Final   Alkaline Phosphatase 01/02/2024 71  38 - 126 U/L Final   Total Bilirubin 01/02/2024 0.3   0.0 - 1.2 mg/dL Final   GFR, Estimated 01/02/2024 >60  >60 mL/min Final   Anion gap 01/02/2024 6  5 - 15 Final   Iron 01/02/2024 123  28 - 170 ug/dL Final   TIBC 90/83/7974 294  250 - 450 ug/dL Final   Saturation Ratios 01/02/2024 42 (H)  10.4 - 31.8 % Final   UIBC 01/02/2024 171  ug/dL Final   Ferritin 90/83/7974 65  11 - 307 ng/mL Final   WBC Count 01/02/2024 8.9  4.0 - 10.5 K/uL Final   RBC 01/02/2024 4.11  3.87 - 5.11 MIL/uL Final   Hemoglobin 01/02/2024 13.0  12.0 - 15.0 g/dL Final   HCT 90/83/7974 38.8  36.0 - 46.0 % Final   MCV 01/02/2024 94.4  80.0 - 100.0 fL Final   MCH 01/02/2024 31.6  26.0 - 34.0 pg Final   MCHC 01/02/2024 33.5  30.0 - 36.0 g/dL Final   RDW 90/83/7974 14.1  11.5 - 15.5 % Final   Platelet Count 01/02/2024 746 (H)  150 - 400 K/uL Final   nRBC 01/02/2024 0.0  0.0 - 0.2 % Final   Neutrophils Relative % 01/02/2024 65  % Final   Neutro Abs 01/02/2024 5.8  1.7 - 7.7 K/uL Final   Lymphocytes Relative 01/02/2024 25  % Final   Lymphs Abs 01/02/2024 2.2  0.7 - 4.0 K/uL Final   Monocytes Relative 01/02/2024 7  % Final   Monocytes Absolute 01/02/2024 0.7  0.1 - 1.0 K/uL Final   Eosinophils Relative 01/02/2024 3  % Final   Eosinophils Absolute 01/02/2024 0.3  0.0 - 0.5 K/uL Final   Basophils Relative 01/02/2024 0  % Final   Basophils Absolute 01/02/2024 0.0  0.0 - 0.1 K/uL Final   Immature Granulocytes 01/02/2024 0  % Final   Abs Immature Granulocytes 01/02/2024 0.02  0.00 - 0.07 K/uL Final  Appointment on 12/12/2023  Component Date Value Ref Range Status   Iron 12/12/2023 118  28 - 170 ug/dL Final   TIBC 91/73/7974 279  250 - 450 ug/dL Final   Saturation Ratios 12/12/2023 42 (H)  10.4 - 31.8 % Final   UIBC 12/12/2023 161  148 - 442 ug/dL Final   Sodium 91/73/7974 137  135 - 145 mmol/L Final   Potassium 12/12/2023 4.3  3.5 - 5.1 mmol/L Final   Chloride 12/12/2023 107  98 - 111 mmol/L Final   CO2 12/12/2023 24  22 - 32 mmol/L Final   Glucose, Bld 12/12/2023 91  70  - 99 mg/dL Final   BUN 91/73/7974 17  8 - 23 mg/dL Final   Creatinine 91/73/7974 0.86  0.44 - 1.00 mg/dL Final   Calcium 91/73/7974 9.2  8.9 - 10.3 mg/dL Final   Total Protein 91/73/7974 6.6  6.5 -  8.1 g/dL Final   Albumin 91/73/7974 4.4  3.5 - 5.0 g/dL Final   AST 91/73/7974 19  15 - 41 U/L Final   ALT 12/12/2023 9  0 - 44 U/L Final   Alkaline Phosphatase 12/12/2023 53  38 - 126 U/L Final   Total Bilirubin 12/12/2023 0.3  0.0 - 1.2 mg/dL Final   GFR, Estimated 12/12/2023 >60  >60 mL/min Final   Anion gap 12/12/2023 6  5 - 15 Final   Ferritin 12/12/2023 68  11 - 307 ng/mL Final   WBC Count 12/12/2023 8.6  4.0 - 10.5 K/uL Final   RBC 12/12/2023 4.00  3.87 - 5.11 MIL/uL Final   Hemoglobin 12/12/2023 12.5  12.0 - 15.0 g/dL Final   HCT 91/73/7974 37.3  36.0 - 46.0 % Final   MCV 12/12/2023 93.3  80.0 - 100.0 fL Final   MCH 12/12/2023 31.3  26.0 - 34.0 pg Final   MCHC 12/12/2023 33.5  30.0 - 36.0 g/dL Final   RDW 91/73/7974 13.5  11.5 - 15.5 % Final   Platelet Count 12/12/2023 732 (H)  150 - 400 K/uL Final   nRBC 12/12/2023 0.0  0.0 - 0.2 % Final   Neutrophils Relative % 12/12/2023 69  % Final   Neutro Abs 12/12/2023 5.9  1.7 - 7.7 K/uL Final   Lymphocytes Relative 12/12/2023 22  % Final   Lymphs Abs 12/12/2023 1.9  0.7 - 4.0 K/uL Final   Monocytes Relative 12/12/2023 7  % Final   Monocytes Absolute 12/12/2023 0.6  0.1 - 1.0 K/uL Final   Eosinophils Relative 12/12/2023 2  % Final   Eosinophils Absolute 12/12/2023 0.2  0.0 - 0.5 K/uL Final   Basophils Relative 12/12/2023 0  % Final   Basophils Absolute 12/12/2023 0.0  0.0 - 0.1 K/uL Final   Immature Granulocytes 12/12/2023 0  % Final   Abs Immature Granulocytes 12/12/2023 0.03  0.00 - 0.07 K/uL Final  Appointment on 10/12/2023  Component Date Value Ref Range Status   Iron 10/12/2023 193 (H)  28 - 170 ug/dL Final   TIBC 93/73/7974 287  250 - 450 ug/dL Final   Saturation Ratios 10/12/2023 67 (H)  10.4 - 31.8 % Final   UIBC 10/12/2023  94 (L)  148 - 442 ug/dL Final   Ferritin 93/73/7974 59  11 - 307 ng/mL Final   WBC Count 10/12/2023 7.9  4.0 - 10.5 K/uL Final   RBC 10/12/2023 3.79 (L)  3.87 - 5.11 MIL/uL Final   Hemoglobin 10/12/2023 12.1  12.0 - 15.0 g/dL Final   HCT 93/73/7974 35.5 (L)  36.0 - 46.0 % Final   MCV 10/12/2023 93.7  80.0 - 100.0 fL Final   MCH 10/12/2023 31.9  26.0 - 34.0 pg Final   MCHC 10/12/2023 34.1  30.0 - 36.0 g/dL Final   RDW 93/73/7974 15.3  11.5 - 15.5 % Final   Platelet Count 10/12/2023 648 (H)  150 - 400 K/uL Final   nRBC 10/12/2023 0.0  0.0 - 0.2 % Final   Neutrophils Relative % 10/12/2023 66  % Final   Neutro Abs 10/12/2023 5.2  1.7 - 7.7 K/uL Final   Lymphocytes Relative 10/12/2023 24  % Final   Lymphs Abs 10/12/2023 1.9  0.7 - 4.0 K/uL Final   Monocytes Relative 10/12/2023 7  % Final   Monocytes Absolute 10/12/2023 0.6  0.1 - 1.0 K/uL Final   Eosinophils Relative 10/12/2023 2  % Final   Eosinophils Absolute 10/12/2023 0.2  0.0 -  0.5 K/uL Final   Basophils Relative 10/12/2023 0  % Final   Basophils Absolute 10/12/2023 0.0  0.0 - 0.1 K/uL Final   Immature Granulocytes 10/12/2023 1  % Final   Abs Immature Granulocytes 10/12/2023 0.04  0.00 - 0.07 K/uL Final  Appointment on 08/10/2023  Component Date Value Ref Range Status   WBC Count 08/10/2023 8.1  4.0 - 10.5 K/uL Final   RBC 08/10/2023 4.38  3.87 - 5.11 MIL/uL Final   Hemoglobin 08/10/2023 13.5  12.0 - 15.0 g/dL Final   HCT 95/75/7974 39.5  36.0 - 46.0 % Final   MCV 08/10/2023 90.2  80.0 - 100.0 fL Final   MCH 08/10/2023 30.8  26.0 - 34.0 pg Final   MCHC 08/10/2023 34.2  30.0 - 36.0 g/dL Final   RDW 95/75/7974 13.8  11.5 - 15.5 % Final   Platelet Count 08/10/2023 706 (H)  150 - 400 K/uL Final   nRBC 08/10/2023 0.0  0.0 - 0.2 % Final   Neutrophils Relative % 08/10/2023 60  % Final   Neutro Abs 08/10/2023 4.8  1.7 - 7.7 K/uL Final   Lymphocytes Relative 08/10/2023 30  % Final   Lymphs Abs 08/10/2023 2.4  0.7 - 4.0 K/uL Final    Monocytes Relative 08/10/2023 7  % Final   Monocytes Absolute 08/10/2023 0.6  0.1 - 1.0 K/uL Final   Eosinophils Relative 08/10/2023 2  % Final   Eosinophils Absolute 08/10/2023 0.2  0.0 - 0.5 K/uL Final   Basophils Relative 08/10/2023 1  % Final   Basophils Absolute 08/10/2023 0.0  0.0 - 0.1 K/uL Final   Immature Granulocytes 08/10/2023 0  % Final   Abs Immature Granulocytes 08/10/2023 0.03  0.00 - 0.07 K/uL Final   Ferritin 08/10/2023 56  11 - 307 ng/mL Final   Iron 08/10/2023 155  28 - 170 ug/dL Final   TIBC 95/75/7974 256  250 - 450 ug/dL Final   Saturation Ratios 08/10/2023 61 (H)  10.4 - 31.8 % Final   UIBC 08/10/2023 101 (L)  148 - 442 ug/dL Final   Sodium 95/75/7974 139  135 - 145 mmol/L Final   Potassium 08/10/2023 4.1  3.5 - 5.1 mmol/L Final   Chloride 08/10/2023 105  98 - 111 mmol/L Final   CO2 08/10/2023 28  22 - 32 mmol/L Final   Glucose, Bld 08/10/2023 87  70 - 99 mg/dL Final   BUN 95/75/7974 17  8 - 23 mg/dL Final   Creatinine 95/75/7974 0.62  0.44 - 1.00 mg/dL Final   Calcium 95/75/7974 9.7  8.9 - 10.3 mg/dL Final   Total Protein 95/75/7974 7.3  6.5 - 8.1 g/dL Final   Albumin 95/75/7974 5.0  3.5 - 5.0 g/dL Final   AST 95/75/7974 24  15 - 41 U/L Final   ALT 08/10/2023 13  0 - 44 U/L Final   Alkaline Phosphatase 08/10/2023 47  38 - 126 U/L Final   Total Bilirubin 08/10/2023 0.4  0.0 - 1.2 mg/dL Final   GFR, Estimated 08/10/2023 >60  >60 mL/min Final   Anion gap 08/10/2023 6  5 - 15 Final  Appointment on 06/28/2023  Component Date Value Ref Range Status   Sodium 06/28/2023 141  135 - 145 mmol/L Final   Potassium 06/28/2023 4.2  3.5 - 5.1 mmol/L Final   Chloride 06/28/2023 107  98 - 111 mmol/L Final   CO2 06/28/2023 27  22 - 32 mmol/L Final   Glucose, Bld 06/28/2023 98  70 -  99 mg/dL Final   BUN 96/87/7974 17  8 - 23 mg/dL Final   Creatinine 96/87/7974 0.64  0.44 - 1.00 mg/dL Final   Calcium 96/87/7974 9.2  8.9 - 10.3 mg/dL Final   Total Protein 96/87/7974 7.0  6.5  - 8.1 g/dL Final   Albumin 96/87/7974 4.7  3.5 - 5.0 g/dL Final   AST 96/87/7974 18  15 - 41 U/L Final   ALT 06/28/2023 9  0 - 44 U/L Final   Alkaline Phosphatase 06/28/2023 59  38 - 126 U/L Final   Total Bilirubin 06/28/2023 0.4  0.0 - 1.2 mg/dL Final   GFR, Estimated 06/28/2023 >60  >60 mL/min Final   Anion gap 06/28/2023 7  5 - 15 Final   Iron 06/28/2023 153  28 - 170 ug/dL Final   TIBC 96/87/7974 295  250 - 450 ug/dL Final   Saturation Ratios 06/28/2023 52 (H)  10.4 - 31.8 % Final   UIBC 06/28/2023 142 (L)  148 - 442 ug/dL Final   Ferritin 96/87/7974 19  11 - 307 ng/mL Final   WBC Count 06/28/2023 7.4  4.0 - 10.5 K/uL Final   RBC 06/28/2023 4.00  3.87 - 5.11 MIL/uL Final   Hemoglobin 06/28/2023 12.8  12.0 - 15.0 g/dL Final   HCT 96/87/7974 38.6  36.0 - 46.0 % Final   MCV 06/28/2023 96.5  80.0 - 100.0 fL Final   MCH 06/28/2023 32.0  26.0 - 34.0 pg Final   MCHC 06/28/2023 33.2  30.0 - 36.0 g/dL Final   RDW 96/87/7974 13.7  11.5 - 15.5 % Final   Platelet Count 06/28/2023 668 (H)  150 - 400 K/uL Final   nRBC 06/28/2023 0.0  0.0 - 0.2 % Final   Neutrophils Relative % 06/28/2023 59  % Final   Neutro Abs 06/28/2023 4.3  1.7 - 7.7 K/uL Final   Lymphocytes Relative 06/28/2023 31  % Final   Lymphs Abs 06/28/2023 2.3  0.7 - 4.0 K/uL Final   Monocytes Relative 06/28/2023 7  % Final   Monocytes Absolute 06/28/2023 0.5  0.1 - 1.0 K/uL Final   Eosinophils Relative 06/28/2023 3  % Final   Eosinophils Absolute 06/28/2023 0.2  0.0 - 0.5 K/uL Final   Basophils Relative 06/28/2023 0  % Final   Basophils Absolute 06/28/2023 0.0  0.0 - 0.1 K/uL Final   Immature Granulocytes 06/28/2023 0  % Final   Abs Immature Granulocytes 06/28/2023 0.02  0.00 - 0.07 K/uL Final  Appointment on 05/31/2023  Component Date Value Ref Range Status   Sodium 05/31/2023 139  135 - 145 mmol/L Final   Potassium 05/31/2023 3.7  3.5 - 5.1 mmol/L Final   Chloride 05/31/2023 106  98 - 111 mmol/L Final   CO2 05/31/2023 26   22 - 32 mmol/L Final   Glucose, Bld 05/31/2023 93  70 - 99 mg/dL Final   BUN 97/87/7974 17  8 - 23 mg/dL Final   Creatinine 97/87/7974 0.65  0.44 - 1.00 mg/dL Final   Calcium 97/87/7974 9.4  8.9 - 10.3 mg/dL Final   Total Protein 97/87/7974 7.4  6.5 - 8.1 g/dL Final   Albumin 97/87/7974 4.8  3.5 - 5.0 g/dL Final   AST 97/87/7974 18  15 - 41 U/L Final   ALT 05/31/2023 10  0 - 44 U/L Final   Alkaline Phosphatase 05/31/2023 69  38 - 126 U/L Final   Total Bilirubin 05/31/2023 0.4  0.0 - 1.2 mg/dL Final   GFR, Estimated  05/31/2023 >60  >60 mL/min Final   Anion gap 05/31/2023 7  5 - 15 Final   Iron 05/31/2023 159  28 - 170 ug/dL Final   TIBC 97/87/7974 328  250 - 450 ug/dL Final   Saturation Ratios 05/31/2023 49 (H)  10.4 - 31.8 % Final   UIBC 05/31/2023 169  148 - 442 ug/dL Final   Ferritin 97/87/7974 18  11 - 307 ng/mL Final   WBC Count 05/31/2023 8.8  4.0 - 10.5 K/uL Final   RBC 05/31/2023 3.98  3.87 - 5.11 MIL/uL Final   Hemoglobin 05/31/2023 13.0  12.0 - 15.0 g/dL Final   HCT 97/87/7974 39.2  36.0 - 46.0 % Final   MCV 05/31/2023 98.5  80.0 - 100.0 fL Final   MCH 05/31/2023 32.7  26.0 - 34.0 pg Final   MCHC 05/31/2023 33.2  30.0 - 36.0 g/dL Final   RDW 97/87/7974 14.4  11.5 - 15.5 % Final   Platelet Count 05/31/2023 729 (H)  150 - 400 K/uL Final   nRBC 05/31/2023 0.0  0.0 - 0.2 % Final   Neutrophils Relative % 05/31/2023 60  % Final   Neutro Abs 05/31/2023 5.2  1.7 - 7.7 K/uL Final   Lymphocytes Relative 05/31/2023 30  % Final   Lymphs Abs 05/31/2023 2.7  0.7 - 4.0 K/uL Final   Monocytes Relative 05/31/2023 7  % Final   Monocytes Absolute 05/31/2023 0.6  0.1 - 1.0 K/uL Final   Eosinophils Relative 05/31/2023 3  % Final   Eosinophils Absolute 05/31/2023 0.3  0.0 - 0.5 K/uL Final   Basophils Relative 05/31/2023 0  % Final   Basophils Absolute 05/31/2023 0.0  0.0 - 0.1 K/uL Final   Immature Granulocytes 05/31/2023 0  % Final   Abs Immature Granulocytes 05/31/2023 0.03  0.00 - 0.07  K/uL Final  Appointment on 05/01/2023  Component Date Value Ref Range Status   Sodium 05/01/2023 139  135 - 145 mmol/L Final   Potassium 05/01/2023 4.2  3.5 - 5.1 mmol/L Final   Chloride 05/01/2023 105  98 - 111 mmol/L Final   CO2 05/01/2023 27  22 - 32 mmol/L Final   Glucose, Bld 05/01/2023 90  70 - 99 mg/dL Final   BUN 98/86/7974 15  8 - 23 mg/dL Final   Creatinine 98/86/7974 0.63  0.44 - 1.00 mg/dL Final   Calcium 98/86/7974 9.6  8.9 - 10.3 mg/dL Final   Total Protein 98/86/7974 7.4  6.5 - 8.1 g/dL Final   Albumin 98/86/7974 4.9  3.5 - 5.0 g/dL Final   AST 98/86/7974 18  15 - 41 U/L Final   ALT 05/01/2023 8  0 - 44 U/L Final   Alkaline Phosphatase 05/01/2023 62  38 - 126 U/L Final   Total Bilirubin 05/01/2023 0.4  0.0 - 1.2 mg/dL Final   GFR, Estimated 05/01/2023 >60  >60 mL/min Final   Anion gap 05/01/2023 7  5 - 15 Final   Iron 05/01/2023 168  28 - 170 ug/dL Final   TIBC 98/86/7974 316  250 - 450 ug/dL Final   Saturation Ratios 05/01/2023 53 (H)  10.4 - 31.8 % Final   UIBC 05/01/2023 148  148 - 442 ug/dL Final   Ferritin 98/86/7974 21  11 - 307 ng/mL Final   WBC Count 05/01/2023 9.0  4.0 - 10.5 K/uL Final   RBC 05/01/2023 3.82 (L)  3.87 - 5.11 MIL/uL Final   Hemoglobin 05/01/2023 12.1  12.0 - 15.0 g/dL Final  HCT 05/01/2023 37.1  36.0 - 46.0 % Final   MCV 05/01/2023 97.1  80.0 - 100.0 fL Final   MCH 05/01/2023 31.7  26.0 - 34.0 pg Final   MCHC 05/01/2023 32.6  30.0 - 36.0 g/dL Final   RDW 98/86/7974 16.1 (H)  11.5 - 15.5 % Final   Platelet Count 05/01/2023 755 (H)  150 - 400 K/uL Final   nRBC 05/01/2023 0.0  0.0 - 0.2 % Final   Neutrophils Relative % 05/01/2023 55  % Final   Neutro Abs 05/01/2023 5.1  1.7 - 7.7 K/uL Final   Lymphocytes Relative 05/01/2023 32  % Final   Lymphs Abs 05/01/2023 2.9  0.7 - 4.0 K/uL Final   Monocytes Relative 05/01/2023 7  % Final   Monocytes Absolute 05/01/2023 0.6  0.1 - 1.0 K/uL Final   Eosinophils Relative 05/01/2023 4  % Final    Eosinophils Absolute 05/01/2023 0.3  0.0 - 0.5 K/uL Final   Basophils Relative 05/01/2023 1  % Final   Basophils Absolute 05/01/2023 0.1  0.0 - 0.1 K/uL Final   Immature Granulocytes 05/01/2023 1  % Final   Abs Immature Granulocytes 05/01/2023 0.05  0.00 - 0.07 K/uL Final  Procedure visit on 04/10/2023  Component Date Value Ref Range Status   SURGICAL PATHOLOGY 04/10/2023    Final-Edited                   Value:SURGICAL PATHOLOGY [facility] [address], [address] [place], [place] [address] Telephone [phone] or [phone] Fax [phone]  REPORT OF SURGICAL PATHOLOGY   Accession #: [id number] Patient Name: JIYAH, TORPEY Visit # : [id number]  MRN: [id number] Physician: Leigh Standing DOB/Age May 05, 1950 (Age: 54) Gender: F Collected Date: 04/10/2023 Received Date: 04/13/2023  FINAL DIAGNOSIS       1. 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 Surgical [P], Colon, Cecum, Polyp (1)                          2. Surgical [P], Colon, Transverse, Sigmoid, Rectal, Polyp (5)  SPECIMEN COMMENTS: 1. Positive colorectal cancer screening using cologuard test; benign neoplasm of cecum; benign neoplasm of transverse colon; benign neoplasm of sigmoid colon; benign neoplasm of rectum and anal canal SPECIMEN CLINICAL INFORMATION: 1. R/O adenoma 2. R/O adenoma    Gross Description 1. Received in formalin are tan, soft tissue fragments that are submitted in toto.Number: 1 Size: 1.1 cm, (1B) ( TA ) 2.  Received in formalin are tan, soft tissue fragments that are submitted in toto.Only 4 pol in bottle Number: 4, Size: 0.3 cm smallest to 0.9 cm largest, (1B) ( TA )        Report signed out from the following location(s) [place]. [facility] [address] [place], [place] [address] CLIA #: [id number]  [facility] [address] [place], [place] [address] CLIA #: [id number]   Appointment on  03/30/2023  Component Date Value Ref Range Status   Sodium 03/30/2023 139  135 - 145 mmol/L Final   Potassium 03/30/2023 4.4  3.5 - 5.1 mmol/L Final   Chloride 03/30/2023 104  98 - 111 mmol/L Final   CO2 03/30/2023 28  22 - 32 mmol/L Final   Glucose, Bld 03/30/2023 98  70 - 99 mg/dL Final   BUN 87/87/7975 16  8 - 23 mg/dL Final   Creatinine 87/87/7975 0.60  0.44 - 1.00 mg/dL Final   Calcium 87/87/7975 9.7  8.9 - 10.3 mg/dL Final   Total Protein 87/87/7975 7.6  6.5 - 8.1 g/dL Final   Albumin 87/87/7975 5.0  3.5 - 5.0 g/dL Final   AST 87/87/7975 20  15 - 41 U/L Final   ALT 03/30/2023 10  0 - 44 U/L Final   Alkaline Phosphatase 03/30/2023 61  38 - 126 U/L Final   Total Bilirubin 03/30/2023 0.4  <1.2 mg/dL Final   GFR, Estimated 03/30/2023 >60  >60 mL/min Final   Anion gap 03/30/2023 7  5 - 15 Final   Iron 03/30/2023 174 (H)  28 - 170 ug/dL Final   TIBC 87/87/7975 274  250 - 450 ug/dL Final   Saturation Ratios 03/30/2023 63 (H)  10.4 - 31.8 % Final   UIBC 03/30/2023 100 (L)  148 - 442 ug/dL Final   Ferritin 87/87/7975 45  11 - 307 ng/mL Final   WBC Count 03/30/2023 9.5  4.0 - 10.5 K/uL Final   RBC 03/30/2023 4.08  3.87 - 5.11 MIL/uL Final   Hemoglobin 03/30/2023 12.9  12.0 - 15.0 g/dL Final   HCT 87/87/7975 39.0  36.0 - 46.0 % Final   MCV 03/30/2023 95.6  80.0 - 100.0 fL Final   MCH 03/30/2023 31.6  26.0 - 34.0 pg Final   MCHC 03/30/2023 33.1  30.0 - 36.0 g/dL Final   RDW 87/87/7975 14.8  11.5 - 15.5 % Final   Platelet Count 03/30/2023 751 (H)  150 - 400 K/uL  Final   nRBC 03/30/2023 0.0  0.0 - 0.2 % Final   Neutrophils Relative % 03/30/2023 62  % Final   Neutro Abs 03/30/2023 5.9  1.7 - 7.7 K/uL Final   Lymphocytes Relative 03/30/2023 27  % Final   Lymphs Abs 03/30/2023 2.5  0.7 - 4.0 K/uL Final   Monocytes Relative 03/30/2023 8  % Final   Monocytes Absolute 03/30/2023 0.7  0.1 - 1.0 K/uL Final   Eosinophils Relative 03/30/2023 3  % Final   Eosinophils Absolute 03/30/2023 0.3  0.0 - 0.5 K/uL Final   Basophils Relative 03/30/2023 0  % Final   Basophils Absolute 03/30/2023 0.0  0.0 - 0.1 K/uL Final   Immature Granulocytes 03/30/2023 0  % Final   Abs Immature Granulocytes 03/30/2023 0.02  0.00 - 0.07 K/uL Final  Appointment on 02/16/2023  Component Date Value Ref Range Status   Sodium 02/16/2023 137  135 - 145 mmol/L Final   Potassium 02/16/2023 4.1  3.5 - 5.1 mmol/L Final   Chloride 02/16/2023 103  98 - 111 mmol/L Final   CO2 02/16/2023 26  22 - 32 mmol/L Final   Glucose, Bld 02/16/2023 92  70 - 99 mg/dL Final   BUN 89/68/7975 24 (H)  8 - 23 mg/dL Final   Creatinine 89/68/7975 0.66  0.44 - 1.00 mg/dL Final   Calcium 89/68/7975 9.9  8.9 - 10.3 mg/dL Final   Total Protein 89/68/7975 7.8  6.5 - 8.1 g/dL Final  Albumin 02/16/2023 5.1 (H)  3.5 - 5.0 g/dL Final   AST 89/68/7975 20  15 - 41 U/L Final   ALT 02/16/2023 12  0 - 44 U/L Final   Alkaline Phosphatase 02/16/2023 64  38 - 126 U/L Final   Total Bilirubin 02/16/2023 0.5  0.3 - 1.2 mg/dL Final   GFR, Estimated 02/16/2023 >60  >60 mL/min Final   Anion gap 02/16/2023 8  5 - 15 Final   Iron 02/16/2023 221 (H)  28 - 170 ug/dL Final   TIBC 89/68/7975 308  250 - 450 ug/dL Final   Saturation Ratios 02/16/2023 72 (H)  10.4 - 31.8 % Final   UIBC 02/16/2023 87 (L)  148 - 442 ug/dL Final   Ferritin 89/68/7975 56  11 - 307 ng/mL Final   WBC Count 02/16/2023 10.1  4.0 - 10.5 K/uL Final   RBC 02/16/2023 4.31  3.87 - 5.11 MIL/uL Final   Hemoglobin 02/16/2023 13.3  12.0 - 15.0 g/dL Final   HCT  89/68/7975 40.5  36.0 - 46.0 % Final   MCV 02/16/2023 94.0  80.0 - 100.0 fL Final   MCH 02/16/2023 30.9  26.0 - 34.0 pg Final   MCHC 02/16/2023 32.8  30.0 - 36.0 g/dL Final   RDW 89/68/7975 14.3  11.5 - 15.5 % Final   Platelet Count 02/16/2023 793 (H)  150 - 400 K/uL Final   nRBC 02/16/2023 0.0  0.0 - 0.2 % Final   Neutrophils Relative % 02/16/2023 59  % Final   Neutro Abs 02/16/2023 6.0  1.7 - 7.7 K/uL Final   Lymphocytes Relative 02/16/2023 30  % Final   Lymphs Abs 02/16/2023 3.1  0.7 - 4.0 K/uL Final   Monocytes Relative 02/16/2023 7  % Final   Monocytes Absolute 02/16/2023 0.7  0.1 - 1.0 K/uL Final   Eosinophils Relative 02/16/2023 3  % Final   Eosinophils Absolute 02/16/2023 0.3  0.0 - 0.5 K/uL Final   Basophils Relative 02/16/2023 1  % Final   Basophils Absolute 02/16/2023 0.1  0.0 - 0.1 K/uL Final   Immature Granulocytes 02/16/2023 0  % Final   Abs Immature Granulocytes 02/16/2023 0.04  0.00 - 0.07 K/uL Final  There may be more visits with results that are not included.  No image results found. DG Foot Complete Right Result Date: 01/02/2024 CLINICAL DATA:  Right foot pain EXAM: RIGHT FOOT COMPLETE - 3+ VIEW COMPARISON:  None Available. FINDINGS: There is no evidence of fracture or dislocation. Mild first MTP joint degenerative osteophytosis. Soft tissue swelling. IMPRESSION: Soft tissue swelling with no acute osseous abnormality is identified. Electronically Signed   By: Michaeline Blanch M.D.   On: 01/02/2024 15:15   VAS US  LOWER EXTREMITY VENOUS (DVT) Result Date: 01/02/2024  Lower Venous DVT Study Patient Name:  Cornelius Schuitema  Date of Exam:   01/02/2024 Medical Rec #: [id number]    Accession #:    [id number] Date of Birth: March 27, 1951    Patient Gender: F Patient Age:   36 years Exam Location:  [address] Procedure:      VAS US  LOWER EXTREMITY VENOUS (DVT) Referring Phys: MACKEY CHAD --------------------------------------------------------------------------------  Indications: Pain,  Swelling in her instep of right foot.  Performing Technologist: Devere Dark RVT  Examination Guidelines: A complete evaluation includes B-mode imaging, spectral Doppler, color Doppler, and power Doppler as needed of all accessible portions of each vessel. Bilateral testing is considered an integral part of a complete examination. Limited examinations for reoccurring indications may  be performed as noted. The reflux portion of the exam is performed with the patient in reverse Trendelenburg.  +---------+---------------+---------+-----------+----------+--------------+ RIGHT    CompressibilityPhasicitySpontaneityPropertiesThrombus Aging +---------+---------------+---------+-----------+----------+--------------+ CFV      Full           Yes      Yes                                 +---------+---------------+---------+-----------+----------+--------------+ SFJ      Full           Yes      Yes                                 +---------+---------------+---------+-----------+----------+--------------+ FV Prox  Full           Yes      Yes                                 +---------+---------------+---------+-----------+----------+--------------+ FV Mid   Full           Yes      Yes                                 +---------+---------------+---------+-----------+----------+--------------+ FV DistalFull           Yes      Yes                                 +---------+---------------+---------+-----------+----------+--------------+ PFV      Full           Yes      Yes                                 +---------+---------------+---------+-----------+----------+--------------+ POP      Full           Yes      Yes                                 +---------+---------------+---------+-----------+----------+--------------+ PTV      Full           Yes      Yes                                 +---------+---------------+---------+-----------+----------+--------------+ PERO      Full           Yes      Yes                                 +---------+---------------+---------+-----------+----------+--------------+ Gastroc  Full           Yes      Yes                                 +---------+---------------+---------+-----------+----------+--------------+ GSV      Full  Yes      Yes                                 +---------+---------------+---------+-----------+----------+--------------+ SSV      Full           Yes      Yes                                 +---------+---------------+---------+-----------+----------+--------------+   +----+---------------+---------+-----------+----------+--------------+ LEFTCompressibilityPhasicitySpontaneityPropertiesThrombus Aging +----+---------------+---------+-----------+----------+--------------+ CFV Full           Yes      Yes                                 +----+---------------+---------+-----------+----------+--------------+   Findings reported to Left message at 09:20.  Summary: RIGHT: - There is no evidence of deep vein thrombosis in the lower extremity. - There is no evidence of deep vein thrombosis proximal to the inguinal ligament or in the common femoral vein. - There is no evidence of superficial venous thrombosis.   *See table(s) above for measurements and observations. Electronically signed by Lonni Gaskins MD on 01/02/2024 at 9:24:16 AM.    Final    CT ANGIO CHEST AORTA W/CM & OR WO/CM Result Date: 10/26/2023 CLINICAL DATA:  Ascending thoracic aortic aneurysm EXAM: CT ANGIOGRAPHY CHEST WITH CONTRAST TECHNIQUE: Multidetector CT imaging of the chest was performed using the standard protocol during bolus administration of intravenous contrast. Multiplanar CT image reconstructions and MIPs were obtained to evaluate the vascular anatomy. RADIATION DOSE REDUCTION: This exam was performed according to the departmental dose-optimization program which includes automated exposure control,  adjustment of the mA and/or kV according to patient size and/or use of iterative reconstruction technique. CONTRAST:  75mL ISOVUE -370 IOPAMIDOL  (ISOVUE -370) INJECTION 76% COMPARISON:  02/10/2023 FINDINGS: Cardiovascular: Mild dilatation of the ascending thoracic aorta, maximal diameter 40.2 mm. No interval change. No acute aortic process or dissection. Two vessel arch anatomy appearing patent. Remainder of the aorta is normal in caliber. Minor scattered thoracic aortic atherosclerosis. Normal heart size. No pericardial effusion. Central pulmonary arteries are normal in caliber and patent. No significant filling defect or pulmonary embolus by CTA. Central venous structures are patent. No veno-occlusive process. Mediastinum/Nodes: No enlarged mediastinal, hilar, or axillary lymph nodes. Thyroid  gland, trachea, and esophagus demonstrate no significant findings. Lungs/Pleura: Mild centrilobular emphysema pattern. Anterior left upper lobe 7 mm nodule again noted, stable, image 52/6. Scattered areas of atelectasis/scarring in the inferior right upper lobe, lingula, and both lower lobes. No acute airspace process, significant collapse or consolidation. Trachea and central airways appear patent. No interstitial process or edema. No pleural abnormality, effusion, or pneumothorax. Upper Abdomen: No acute abnormality. Musculoskeletal: No chest wall abnormality. No acute or significant osseous findings. Review of the MIP images confirms the above findings. IMPRESSION: 1. Stable mild dilatation of the ascending thoracic aorta, maximal diameter 40.2 mm. Recommend annual imaging followup by CTA or MRA. This recommendation follows 2010 ACCF/AHA/AATS/ACR/ASA/SCA/SCAI/SIR/STS/SVM Guidelines for the Diagnosis and Management of Patients with Thoracic Aortic Disease. Circulation. 2010; 121: Z733-z630. Aortic aneurysm NOS (ICD10-I71.9) 2. No acute intrathoracic finding. 3. Stable 7 mm left upper lobe nodule. Recommend continued annual  screening with low-dose chest CT as previously recommended on the 02/10/2023 exam. Aortic Atherosclerosis (ICD10-I70.0) and Emphysema (ICD10-J43.9). Electronically Signed   By: CHRISTELLA.  Shick M.D.   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 Differential diagnosis includes Jakafi  side effects, with immunosuppression potentially exacerbating the condition (immunosuppressively preventing resolution of disseminated zoster). Clinical suspicion of mild but disseminated zoster with brain involvement exists. Start Valacyclovir  to manage shingles. Order blood tests for shingles antibodies, though results may not be definitive. Discuss potential insurance coverage for tests with front desk staff. Consider stopping Jakafi  if the condition worsens.  Vision changes under evaluation for possible zoster ophthalmicus   Vision changes may relate to zoster ophthalmicus, with symptoms of vision blurriness and concern for optic nerve involvement. Urgent evaluation is needed to rule out ocular involvement. Refer urgently to a female ophthalmologist to evaluate for zoster ophthalmicus. If shingles are found on the retina, consider additional antivirals.  Polycythemia vera   Polycythemia vera is managed with regular follow-ups. There is a potential interaction with shingles and Jakafi  treatment.  Immunosuppression due to ruxolitinib  therapy   Immunosuppression from  ruxolitinib  therapy may contribute to prolonged shingles recovery and potential dissemination. Consider stopping Jakafi  if the condition worsens.  Circadian rhythm sleep disorder   Circadian rhythm sleep disorder may relate to shingles or Jakafi  side effects, with symptoms of a flipped sleep-wake cycle and difficulty maintaining sleep. Monitor sleep pattern changes and assess improvement with shingles treatment.   ORDER ASSOCIATIONS  #   DIAGNOSIS / CONDITION ICD-10 ENCOUNTER ORDER     ICD-10-CM   1. Herpes zoster with complication  B02.8 Varicella zoster antibody, IgG    Varicella zoster antibody, IgM    Ambulatory referral to Ophthalmology    2. Post herpetic neuralgia  B02.29 Varicella zoster antibody, IgG    Varicella zoster antibody, IgM    3. Need for shingles vaccine  Z23 Varicella zoster antibody, IgG    Varicella zoster antibody, IgM    4. Immunosuppression  D84.9 Varicella zoster antibody, IgG    Varicella zoster antibody, IgM    5. Adverse drug interaction with prescription medication  T50.905A Varicella zoster antibody, IgG    Varicella zoster antibody, IgM    valACYclovir  (VALTREX ) 1000 MG tablet    6. Vision changes  H53.9 Varicella zoster antibody, IgG    Varicella zoster antibody, IgM    7. Herpes zoster encephalitis  B02.0            Orders Placed in Encounter:   Lab Orders         Varicella zoster antibody, IgG         Varicella zoster antibody, IgM     Imaging Orders  No imaging studies ordered today   Referral Orders         Ambulatory referral to Ophthalmology     Meds ordered this encounter  Medications   valACYclovir  (VALTREX ) 1000 MG tablet    Sig: Take 1 tablet (1,000 mg total) by mouth 2 (two) times daily for 10 days.    Dispense:  20 tablet    Refill:  0    Orders Placed This Encounter  Procedures  Varicella zoster antibody, IgG   Varicella zoster antibody, IgM   Ambulatory referral to Ophthalmology    Referral Priority:   Urgent     Referral Type:   Consultation    Referral Reason:   Specialty Services Required    Requested Specialty:   Ophthalmology    Number of Visits Requested:   1    On the day of the visit, I dedicated 30 minutes to both direct and indirect patient care activities.  The time was spent: Preparation: I reviewed the patient's records before and during the visit to support individualized clinical decision making. History: I obtained, documented, and reviewed a thorough medical history. I reviewed the patient's reported symptoms and clarified their context and significance in relation to the current visit. Examination: I conducted a medically appropriate physical evaluation. Data Synthesis: I synthesized information for clinical decision-making. Communication: I communicated clinical status and plan to the patient and/or family/caregiver. Counseling & Education: I provided personalized counseling on condition and treatment. Documentation: Documenting clinical findings and medical decision-making, and creating and providing documentation for patient review. Treatment Plan: I worked collaboratively with the patient to formulate and communicate an individualized plan (including shared decision-making). Orders: I placed necessary orders (medications, labs, imaging, referrals) in the EMR. Referrals and Communication: I referred the patient to other health care professionals as needed and communicated with them to ensure coordinated care.  This time was spent independently of any separately billable procedures. Please note that this statement is intended to provide a clear and comprehensive account of the time and services provided during the patient's visit.  The extended time spent was necessary to provide safe, effective, and comprehensive care due to the following factors:, Extensive Comorbidities: The patient's multiple chronic conditions necessitated careful coordination, monitoring, and integration of care  plans., and Data Analysis & Complex Decision-Making: I performed in-depth data review and complex treatment planning tailored to the patient's unique clinical profile.     This document was synthesized by artificial intelligence ([employer]bridge) using HIPAA-compliant recording of the clinical interaction;   We discussed the use of AI scribe software for clinical note transcription with the patient, who gave verbal consent to proceed. additional Info: This encounter employed state-of-the-art, real-time, collaborative documentation. The patient actively reviewed and assisted in updating their electronic medical record on a shared screen, ensuring transparency and facilitating joint problem-solving for the problem list, overview, and plan. This approach promotes accurate, informed care. The treatment plan was discussed and reviewed in detail, including medication safety, potential side effects, and all patient questions. We confirmed understanding and comfort with the plan. Follow-up instructions were established, including contacting the office for any concerns, returning if symptoms worsen, persist, or new symptoms develop, and precautions for potential emergency department visits.

## 2024-01-17 NOTE — Patient Instructions (Addendum)
 It was a pleasure seeing you today! Your health and satisfaction are our top priorities.  Kara Cone, MD  VISIT SUMMARY: During your visit, we discussed your ongoing issues with shingles, post-herpetic neuralgia, vision changes, and sleep disturbances. We reviewed your current medications and their potential side effects, and we planned further evaluations and treatments to address your symptoms.  YOUR PLAN: -DISSEMINATED SHINGLES WITH POSSIBLE ENCEPHALOPATHY AND POSTHERPETIC NEURALGIA: Disseminated shingles is a widespread reactivation of the chickenpox virus, which can cause severe pain and potentially affect the brain. We will start you on Valacyclovir  to manage the shingles. Blood tests for shingles antibodies will be ordered, though they may not provide definitive results. Please discuss potential insurance coverage for these tests with the front desk staff. If your condition worsens, we may consider stopping Jakafi .  -VISION CHANGES UNDER EVALUATION FOR POSSIBLE ZOSTER OPHTHALMICUS: Zoster ophthalmicus is a shingles infection that affects the eye and can cause vision problems. You will be urgently referred to a female ophthalmologist to evaluate for this condition. If shingles are found on your retina, additional antiviral medications may be necessary.  -POLYCYTHEMIA VERA: Polycythemia vera is a blood disorder that causes your body to produce too many red blood cells. We will continue to manage this condition with regular follow-ups, keeping in mind its interaction with your shingles and Jakafi  treatment.  -IMMUNOSUPPRESSION DUE TO RUXOLITINIB  THERAPY: Ruxolitinib  (Jakafi ) is a medication that suppresses your immune system, which may contribute to the prolonged recovery from shingles. If your condition worsens, we may consider stopping Jakafi .  -CIRCADIAN RHYTHM SLEEP DISORDER: Circadian rhythm sleep disorder is a disruption of your natural sleep-wake cycle. This may be related to your  shingles or a side effect of Jakafi . We will monitor your sleep patterns and assess any improvements as we treat your shingles.  INSTRUCTIONS: Please follow up with the front desk to discuss insurance coverage for the blood tests. You will also need to schedule an urgent appointment with a female ophthalmologist to evaluate your vision changes. Continue taking Valacyclovir  as prescribed and monitor your symptoms closely. If your condition worsens, contact our office immediately as we may need to adjust your Jakafi  treatment.  Your Providers PCP: [name] Kara MATSU, MD,  [phone]) Referring Provider: Cone Kara MATSU, MD,  [phone]) Care Team Provider: Odean Potts, MD,  [phone]) Care Team Provider: Cherrie Toribio SAUNDERS, MD,  [phone]) Care Team Provider: Patel, Donika K, DO,  [phone])  NEXT STEPS: [x]  Early Intervention: Schedule sooner appointment, call our on-call services, or go to emergency room if there is any significant Increase in pain or discomfort New or worsening symptoms Sudden or severe changes in your health [x]  Flexible Follow-Up: We recommend a No follow-ups on file. for optimal routine care. This allows for progress monitoring and treatment adjustments. [x]  Preventive Care: Schedule your annual preventive care visit! It's typically covered by insurance and helps identify potential health issues early. [x]  Lab & X-ray Appointments: Incomplete tests scheduled today, or call to schedule. X-rays: [facility] (M-F, 8:30am-noon or 1pm-5pm). [x]  Medical Information Release: Sign a release form at front desk to obtain relevant medical information we don't have.  MAKING THE MOST OF OUR FOCUSED 20 MINUTE APPOINTMENTS: [x]   Clearly state your top concerns at the beginning of the visit to focus our discussion [x]   If you anticipate you will need more time, please inform the front desk during scheduling - we can book multiple appointments in the  same week. [x]   If you have  transportation problems- use our convenient video appointments or ask about transportation support. [x]   We can get down to business faster if you use MyChart to update information before the visit and submit non-urgent questions before your visit. Thank you for taking the time to provide details through MyChart.  Let our nurse know and she can import this information into your encounter documents.  Arrival and Wait Times: [x]   Arriving on time ensures that everyone receives prompt attention. [x]   Early morning (8a) and afternoon (1p) appointments tend to have shortest wait times. [x]   Unfortunately, we cannot delay appointments for late arrivals or hold slots during phone calls.  Getting Answers and Following Up [x]   Simple Questions & Concerns: For quick questions or basic follow-up after your visit, reach us  at [phone] or MyChart messaging. [x]   Complex Concerns: If your concern is more complex, scheduling an appointment might be best. Discuss this with the staff to find the most suitable option. [x]   Lab & Imaging Results: We'll contact you directly if results are abnormal or you don't use MyChart. Most normal results will be on MyChart within 2-3 business days, with a review message from Dr. Jesus. Haven't heard back in 2 weeks? Need results sooner? Contact us  at [phone]. [x]   Referrals: Our referral coordinator will manage specialist referrals. The specialist's office should contact you within 2 weeks to schedule an appointment. Call us  if you haven't heard from them after 2 weeks.  Staying Connected [x]   MyChart: Activate your MyChart for the fastest way to access results and message us . See the last page of this paperwork for instructions on how to activate.  Bring to Your Next Appointment [x]   Medications: Please bring all your medication bottles to your next appointment to ensure we have an accurate record of your prescriptions. [x]   Health  Diaries: If you're monitoring any health conditions at home, keeping a diary of your readings can be very helpful for discussions at your next appointment.  Billing [x]   X-ray & Lab Orders: These are billed by separate companies. Contact the invoicing company directly for questions or concerns. [x]   Visit Charges: Discuss any billing inquiries with our administrative services team.  Your Satisfaction Matters [x]   Share Your Experience: We strive for your satisfaction! If you have any complaints, or preferably compliments, please let Dr. Jesus know directly or contact our Practice Administrators, Manuelita Rubin or [employer], by asking at the front desk.   Reviewing Your Records [x]   Review this early draft of your clinical encounter notes below and the final encounter summary tomorrow on MyChart after its been completed.  All orders placed so far are visible here: Herpes zoster with complication -     Varicella zoster antibody, IgG -     Varicella zoster antibody, IgM -     Ambulatory referral to Ophthalmology  Post herpetic neuralgia -     Varicella zoster antibody, IgG -     Varicella zoster antibody, IgM  Need for shingles vaccine -     Varicella zoster antibody, IgG -     Varicella zoster antibody, IgM  Immunosuppression -     Varicella zoster antibody, IgG -     Varicella zoster antibody, IgM  Adverse drug interaction with prescription medication -     Varicella zoster antibody, IgG -     Varicella zoster antibody, IgM -     valACYclovir  HCl; Take 1 tablet (1,000 mg total) by mouth 2 (two) times daily  for 10 days.  Dispense: 20 tablet; Refill: 0  Vision changes -     Varicella zoster antibody, IgG -     Varicella zoster antibody, IgM  Herpes zoster encephalitis

## 2024-01-18 ENCOUNTER — Ambulatory Visit: Payer: Self-pay | Admitting: Internal Medicine

## 2024-01-18 ENCOUNTER — Telehealth: Payer: Self-pay | Admitting: Internal Medicine

## 2024-01-18 NOTE — Telephone Encounter (Signed)
 Pt called and states she would like her ophthalmology referral to go to [facility] - [facility] - ASAP

## 2024-01-19 ENCOUNTER — Encounter

## 2024-01-19 ENCOUNTER — Telehealth: Payer: Self-pay

## 2024-01-19 DIAGNOSIS — Z79620 Long term (current) use of immunosuppressive biologic: Secondary | ICD-10-CM | POA: Diagnosis not present

## 2024-01-19 DIAGNOSIS — H25813 Combined forms of age-related cataract, bilateral: Secondary | ICD-10-CM | POA: Diagnosis not present

## 2024-01-19 NOTE — Telephone Encounter (Signed)
 Pt stated she was seen today Dr.Hacker eye dr today stated everything check out fine. I called her to see if she had called anywhere else due to [facility] could not see her til nov. She wanted to let morrison know.

## 2024-01-20 LAB — VARICELLA ZOSTER ANTIBODY, IGM: Varicella Zoster Ab IgM: <=0.9 (ref <=0.90)

## 2024-01-20 LAB — VARICELLA ZOSTER ANTIBODY, IGG: Varicella IgG: 15 {s_co_ratio}

## 2024-01-23 ENCOUNTER — Ambulatory Visit (HOSPITAL_COMMUNITY)
Admission: RE | Admit: 2024-01-23 | Discharge: 2024-01-23 | Disposition: A | Discharge status: Home / Self Care | Source: Ambulatory Visit | Attending: Surgery | Admitting: Surgery

## 2024-01-23 DIAGNOSIS — I712 Thoracic aortic aneurysm, without rupture, unspecified: Secondary | ICD-10-CM | POA: Diagnosis present

## 2024-01-23 DIAGNOSIS — J439 Emphysema, unspecified: Secondary | ICD-10-CM | POA: Diagnosis not present

## 2024-01-23 DIAGNOSIS — R911 Solitary pulmonary nodule: Secondary | ICD-10-CM | POA: Diagnosis not present

## 2024-01-23 DIAGNOSIS — I77810 Thoracic aortic ectasia: Secondary | ICD-10-CM | POA: Diagnosis not present

## 2024-01-23 DIAGNOSIS — I700 Atherosclerosis of aorta: Secondary | ICD-10-CM | POA: Insufficient documentation

## 2024-01-23 MED ORDER — IOHEXOL 350 MG/ML SOLN
75.0000 mL | Freq: Once | INTRAVENOUS | Status: AC | PRN
  Administered 2024-01-23: 75 mL via INTRAVENOUS

## 2024-01-25 ENCOUNTER — Ambulatory Visit: Payer: Self-pay

## 2024-01-25 NOTE — Telephone Encounter (Signed)
 FYI Only or Action Required?: Action required by provider: update on patient condition.  Patient was last seen in primary care on 01/17/2024 by Kara Bernardino MATSU, MD.  Called Nurse Triage reporting Lymphadenopathy.  Symptoms began today.  Interventions attempted: Nothing.  Symptoms are: under chin lymph node swelling completely resolved. Please see nurse triage note for further details.  Triage Disposition: Call PCP Within 24 Hours (overriding Home Care)  Patient/caregiver understands and will follow disposition?: Yes            Copied from CRM [phone] [id number]. Topic: Clinical - Red Word Triage >> Jan 25, 2024  2:21 PM Paige D wrote: Red Word that prompted transfer to Nurse Triage: antiviral for shingles she thinks she has an allergic reaction swollen throat Reason for Disposition  [1] Mildly swollen lymph node AND [2] has cold symptoms (e.g., runny nose, cough, sore throat)  Answer Assessment - Initial Assessment Questions Patient states every day she has a new symptom. She states the symptoms she called in for have already started to resolve. Advised patient to continue medications and await advise of PCP. She states she is concerned because she feels she has not seen improvement on the antiviral. She is asking if this is not actually shingles, if she should continue taking the antiviral or if it should be extended. Offered to schedule a follow up appointment to discuss with her PCP and she requested for a message to be sent instead.  1. LOCATION: Where is the swollen node located? Is the matching node on the other side of the body also swollen?      Under chin. Patient has very difficult time explaining location. She stated it was her throat then her neck, under her ears and then settled on under her chin.  2. SIZE: How big is the node? (e.g., inches or centimeters; or compared to common objects such as pea, bean, marble, golf ball)      She states she is unable to state how  large they are. 1-2 inches but then she states it has resolved.  3. ONSET: When did the swelling start?      This morning, woke up with it.  4. NECK NODES: Is there a sore throat, runny nose or other symptoms of a cold?      Denies sore throat, runny nose. She states she has been having post nasal drip. She states it is constant throughout the year.  5. GROIN OR ARMPIT NODES: Is there a sore, scratch, cut or painful red area on that arm or leg?      N/A.  6. FEVER: Do you have a fever? If Yes, ask: What is it, how was it measured, and when did it start?      No.  7. CAUSE: What do you think is causing the swollen lymph nodes?     She thinks this is an allergic reaction to valacyclovir , she states she has 2 days left of her medication. She Google searched and looked up anaphylaxis to the medication.   8. OTHER SYMPTOMS: Do you have any other symptoms? (e.g., node is tender to touch, skin redness over node, weight changes)     Denies hives, facial swelling, chest pain, difficulty breathing or difficulty swallowing. Patient speaking in full sentences, no labored breathing or wheezing noted. Patient states from her shingles outbreak she has had earlobe swelling and facial tingling for months. Alternating pain in left or right arm. Patient also states: I realized my circadian rhythm flipped  and experienced insomnia for 5 days.  9. PREGNANCY: Is there any chance you are pregnant? When was your last menstrual period?     N/A.  Protocols used: Lymph Nodes - Swollen-A-AH

## 2024-01-26 NOTE — Telephone Encounter (Signed)
 FYI, Pt scheduled for 10/15 at 3:40 PM with PCP. Pt states she has on more day of Antiviral left, so she will complete. Advised pt to keep us  updated on sx, pt verbalized understanding.

## 2024-01-31 ENCOUNTER — Ambulatory Visit: Admitting: Internal Medicine

## 2024-02-05 ENCOUNTER — Inpatient Hospital Stay: Admitting: Hematology and Oncology

## 2024-02-05 ENCOUNTER — Inpatient Hospital Stay: Attending: Hematology and Oncology

## 2024-02-05 ENCOUNTER — Other Ambulatory Visit: Payer: Self-pay

## 2024-02-05 DIAGNOSIS — M79671 Pain in right foot: Secondary | ICD-10-CM | POA: Diagnosis not present

## 2024-02-05 DIAGNOSIS — D473 Essential (hemorrhagic) thrombocythemia: Secondary | ICD-10-CM | POA: Insufficient documentation

## 2024-02-05 DIAGNOSIS — D45 Polycythemia vera: Secondary | ICD-10-CM | POA: Insufficient documentation

## 2024-02-05 DIAGNOSIS — R5383 Other fatigue: Secondary | ICD-10-CM

## 2024-02-05 DIAGNOSIS — B0229 Other postherpetic nervous system involvement: Secondary | ICD-10-CM | POA: Diagnosis not present

## 2024-02-05 DIAGNOSIS — R599 Enlarged lymph nodes, unspecified: Secondary | ICD-10-CM | POA: Diagnosis not present

## 2024-02-05 DIAGNOSIS — D75839 Thrombocytosis, unspecified: Secondary | ICD-10-CM | POA: Diagnosis not present

## 2024-02-05 LAB — CBC WITH DIFFERENTIAL (CANCER CENTER ONLY)
Abs Immature Granulocytes: 0.02 K/uL (ref 0.00–0.07)
Basophils Absolute: 0 K/uL (ref 0.0–0.1)
Basophils Relative: 0 %
Eosinophils Absolute: 0.2 K/uL (ref 0.0–0.5)
Eosinophils Relative: 3 %
HCT: 36.7 % (ref 36.0–46.0)
Hemoglobin: 12.3 g/dL (ref 12.0–15.0)
Immature Granulocytes: 0 %
Lymphocytes Relative: 25 %
Lymphs Abs: 2.2 K/uL (ref 0.7–4.0)
MCH: 32.1 pg (ref 26.0–34.0)
MCHC: 33.5 g/dL (ref 30.0–36.0)
MCV: 95.8 fL (ref 80.0–100.0)
Monocytes Absolute: 0.6 K/uL (ref 0.1–1.0)
Monocytes Relative: 7 %
Neutro Abs: 5.8 K/uL (ref 1.7–7.7)
Neutrophils Relative %: 65 %
Platelet Count: 684 K/uL — ABNORMAL HIGH (ref 150–400)
RBC: 3.83 MIL/uL — ABNORMAL LOW (ref 3.87–5.11)
RDW: 15.7 % — ABNORMAL HIGH (ref 11.5–15.5)
WBC Count: 8.9 K/uL (ref 4.0–10.5)
nRBC: 0 % (ref 0.0–0.2)

## 2024-02-05 LAB — IRON AND IRON BINDING CAPACITY (CC-WL,HP ONLY)
Iron: 100 ug/dL (ref 28–170)
Saturation Ratios: 31 % (ref 10.4–31.8)
TIBC: 323 ug/dL (ref 250–450)
UIBC: 223 ug/dL (ref 148–442)

## 2024-02-05 LAB — FERRITIN: Ferritin: 32 ng/mL (ref 11–307)

## 2024-02-05 NOTE — Progress Notes (Signed)
 Patient Care Team: Jesus Bernardino MATSU, MD as PCP - General (Internal Medicine) Odean Potts, MD as Consulting Physician (Hematology and Oncology) Bensimhon, Toribio SAUNDERS, MD as Consulting Physician (Cardiology) Patel, Donika K, DO as Consulting Physician (Neurology)  DIAGNOSIS:  Encounter Diagnoses  Name Primary?   Hereditary hemochromatosis Yes   Essential thrombocytosis (HCC)    Other fatigue       CHIEF COMPLIANT: Evaluation of polycythemia and hemochromatosis, chronic symptoms related to viral exacerbations versus autoimmune disease  HISTORY OF PRESENT ILLNESS:   History of Present Illness Kara [name] is a 73 year old female with polycythemia vera who presents with a month of worsening symptoms related to neck swelling vision changes and chest symptoms which she thought were related to shingles.  Eventually the symptoms resolved after she was treated with Valtrex .  However at the beginning of Valtrex  varicella-zoster IgM was negative.  She experienced a recent exacerbation of symptoms at the end of September, including blurry vision, severe headaches, and increased tinnitus. Pain was described as 'bouncing around' her body, with significant swelling and pain in her neck. Severe sleep disturbances occurred, with an inability to sleep for several nights. Antiviral treatment with valacyclovir  initially provided relief, but symptoms returned, including severe head pain and tinnitus. Swelling in her neck subsided after several hours, but lymph nodes remained hard. After completing the antiviral course, symptoms improved, though she still experiences residual effects like cloudy vision and foot pain.  She has polycythemia vera and fluctuating platelet counts, with a recent count of 684. During the shingles flare-up, she experienced brain fog and short-term memory issues. She is currently taking Jakafi  for polycythemia vera with no side effects.  No fever, sweating, or breathing difficulties.  Reports severe headaches, tinnitus, neck swelling, and sleep disturbances.     ALLERGIES:  is allergic to demerol [meperidine hcl], penicillins, and tramadol.  MEDICATIONS:  Current Outpatient Medications  Medication Sig Dispense Refill   Alpha-Lipoic Acid 600 MG CAPS Take 1 capsule (600 mg total) by mouth daily at 6 (six) AM. 90 capsule 3   aspirin  EC 81 MG tablet Take 1 tablet (81 mg total) by mouth daily.     ruxolitinib  phosphate (JAKAFI ) 5 MG tablet Take 1 tablet (5 mg total) by mouth 2 (two) times daily. 60 tablet 3   Current Facility-Administered Medications  Medication Dose Route Frequency Provider Last Rate Last Admin   0.9 %  sodium chloride  infusion  500 mL Intravenous Once Armbruster, Elspeth SQUIBB, MD        PHYSICAL EXAMINATION: ECOG PERFORMANCE STATUS: 1 - Symptomatic but completely ambulatory  Vitals:   02/05/24 0848 02/05/24 0849  BP: (!) 152/77 (!) 151/68  Pulse: 73 67  Resp: 17   Temp: 97.6 F (36.4 C)   SpO2: 98%    Filed Weights   02/05/24 0848  Weight: 146 lb 8 oz (66.5 kg)      LABORATORY DATA:  I have reviewed the data as listed    Latest Ref Rng & Units 01/02/2024    2:23 PM 12/12/2023    2:33 PM 08/10/2023   10:18 AM  CMP  Glucose 70 - 99 mg/dL 896  91  87   BUN 8 - 23 mg/dL 16  17  17    Creatinine 0.44 - 1.00 mg/dL 9.34  9.13  9.37   Sodium 135 - 145 mmol/L 140  137  139   Potassium 3.5 - 5.1 mmol/L 4.1  4.3  4.1   Chloride 98 -  111 mmol/L 107  107  105   CO2 22 - 32 mmol/L 27  24  28    Calcium 8.9 - 10.3 mg/dL 9.4  9.2  9.7   Total Protein 6.5 - 8.1 g/dL 7.3  6.6  7.3   Total Bilirubin 0.0 - 1.2 mg/dL 0.3  0.3  0.4   Alkaline Phos 38 - 126 U/L 71  53  47   AST 15 - 41 U/L 20  19  24    ALT 0 - 44 U/L 14  9  13      Lab Results  Component Value Date   WBC 8.9 02/05/2024   HGB 12.3 02/05/2024   HCT 36.7 02/05/2024   MCV 95.8 02/05/2024   PLT 684 (H) 02/05/2024   NEUTROABS 5.8 02/05/2024    ASSESSMENT & PLAN:  Essential  thrombocytosis (HCC) 06/18/21:  Hb 11.2, Platelet 778 07/30/2021: Hemoglobin 12.9, platelets 783, iron saturation 13%, ferritin 7  06/28/2023: Platelets 668, iron saturation 52%, ferritin 16 10/11/2024: Platelets 648, iron saturation 56%, ferritin 59 12/12/2023: Platelets 732, iron saturation 42%, ferritin 68   01/02/24: Platelets 746, Iron sat: 42%, Ferritin: 65    Current treatment: Since the patient is intolerant to anagrelide  and hydroxyurea , currently on Jakafi  at the low-dose of 5 mg twice a day. Started 09/18/2022   Jakafi  toxicities: Denies any major adverse effects to Jakafi  Facial numbness: Saw neurology Dr.Patel: MRI brain: 08/28/2023: No acute findings. Pain on skull and neck: went to chiropractor: Dr.Spell did alignment of skull and pain has resolved. Insomnia: intermittent   Relapse shingles episodes: Waxing and waning symptoms She went on a diet and lost 10 pounds. Hemochromatosis: On periodic phlebotomies.     Right foot swelling:  01/03/2024: X-ray: No bony abnormality. 01/03/2024: Ultrasound right leg: No evidence of DVT   Phlebotomy X 1 on 01/09/2024   Return to clinic in 1 month for labs and follow-up   Assessment & Plan Polycythemia vera with thrombocytosis Managed with Jakafi , no side effects. Shingles correlated with decreased platelet count, possibly aggravating polycythemia. - Continue Jakafi  5 mg bid.  Postherpetic neuralgia and sequelae of shingles Severe shingles episode improved with antivirals, indicating viral etiology. Concern for recurrence. - Consider referral to infectious disease specialist for shingles recurrence risk evaluation.  Suspected autoimmune or immunological disorder Lymph node swelling and systemic symptoms suggest underlying disorder. - Refer to [facility] rheumatology for further evaluation.  Evaluation of thyroid  disorder Neck swelling and weight changes raise concern for thyroid  disorder. - Check thyroid  function tests with today's blood  draw.  Right foot pain, unspecified etiology Described as skeletal in nature, previous consultation with orthopedic surgeon Harden. - Consult with orthopedic surgeon Harden for evaluation of right foot pain.    Orders Placed This Encounter  Procedures   CBC with Differential (Cancer Center Only)    Standing Status:   Future    Expiration Date:   02/04/2025   Ferritin    Standing Status:   Future    Expiration Date:   02/04/2025   Iron and Iron Binding Capacity ([facility] only)    Standing Status:   Future    Expiration Date:   02/04/2025   Thyroid  Panel With TSH    Standing Status:   Future    Expiration Date:   02/04/2025   The patient has a good understanding of the overall plan. she agrees with it. she will call with any problems that may develop before the next visit here.  I personally spent a  total of 30 minutes in the care of the patient today including preparing to see the patient, getting/reviewing separately obtained history, performing a medically appropriate exam/evaluation, counseling and educating, placing orders, referring and communicating with other health care professionals, documenting clinical information in the EHR, independently interpreting results, communicating results, and coordinating care.   Viinay K Daesha Insco, MD 02/05/24

## 2024-02-05 NOTE — Assessment & Plan Note (Signed)
 06/18/21:  Hb 11.2, Platelet 778 07/30/2021: Hemoglobin 12.9, platelets 783, iron saturation 13%, ferritin 7  06/28/2023: Platelets 668, iron saturation 52%, ferritin 16 10/11/2024: Platelets 648, iron saturation 56%, ferritin 59 12/12/2023: Platelets 732, iron saturation 42%, ferritin 68   01/02/24: Platelets 746, Iron sat: 42%, Ferritin: 65    Current treatment: Since the patient is intolerant to anagrelide  and hydroxyurea , currently on Jakafi  at the low-dose of 5 mg twice a day. Started 09/18/2022   Jakafi  toxicities: Denies any major adverse effects to Jakafi  Facial numbness: Saw neurology Dr.Patel: MRI brain: 08/28/2023: No acute findings. Pain on skull and neck: went to chiropractor: Dr.Spell did alignment of skull and pain has resolved. Insomnia: intermittent   Relapse shingles episodes: Waxing and waning symptoms She went on a diet and lost 10 pounds. Hemochromatosis: On periodic phlebotomies.     Right foot swelling:  01/03/2024: X-ray: No bony abnormality. 01/03/2024: Ultrasound right leg: No evidence of DVT     Recommend: Phlebotomy X 1 on 01/09/2024   Return to clinic in 1 month for labs and follow-up

## 2024-02-06 LAB — THYROID PANEL WITH TSH
Free Thyroxine Index: 1.8 (ref 1.2–4.9)
T3 Uptake Ratio: 22 % — ABNORMAL LOW (ref 24–39)
T4, Total: 8.2 ug/dL (ref 4.5–12.0)
TSH: 2.59 u[IU]/mL (ref 0.450–4.500)

## 2024-02-06 NOTE — Progress Notes (Unsigned)
 [address] [place] [address]             [phone]            Kita Neace [id number] 29-Jan-1951   History of Present Illness:  Kara [name] is a 73 year old female with medical history of aortic atherosclerosis, emphysema, polycythemia vera, hereditary hemochromatosis, and former tobacco user who presents for continued follow-up of ascending thoracic aortic aneurysm.  Aneurysm has measured 4.0 cm - 4.3 cm.  On recent CTA of chest on 01/2024 aneurysm measured 4.1 cm. Echocardiogram in 2018 showed tricuspid aortic valve.   She presents to the clinic today and reports that she is doing ok.  She has been having possible herpes zoster with complications. She was referred to a rheumatologist for further evaluation since her symptoms have not been improving.  Her blood pressure is slightly elevated at today's visit.  She has noticed her blood pressure has been more elevated recently.  She attributes this to recent weight gain due to being unable to exercise from that of the zoster complications.  She denies chest pain and shortness of breath.  Current Outpatient Medications on File Prior to Visit  Medication Sig Dispense Refill   Alpha-Lipoic Acid 600 MG CAPS Take 1 capsule (600 mg total) by mouth daily at 6 (six) AM. 90 capsule 3   aspirin  EC 81 MG tablet Take 1 tablet (81 mg total) by mouth daily.     ruxolitinib  phosphate (JAKAFI ) 5 MG tablet Take 1 tablet (5 mg total) by mouth 2 (two) times daily. 60 tablet 3   Current Facility-Administered Medications on File Prior to Visit  Medication Dose Route Frequency Provider Last Rate Last Admin   0.9 %  sodium chloride  infusion  500 mL Intravenous Once Armbruster, Elspeth SQUIBB, MD         ROS: Review of Systems  Constitutional:  Positive for malaise/fatigue.  Respiratory: Negative.  Negative for cough and shortness of breath.   Cardiovascular: Negative.  Negative for chest pain and leg swelling.   Neurological:  Positive for headaches.     There were no vitals taken for this visit.  Physical Exam Constitutional:      Appearance: Normal appearance.  HENT:     Head: Normocephalic and atraumatic.  Cardiovascular:     Rate and Rhythm: Normal rate and regular rhythm.     Heart sounds: Normal heart sounds, S1 normal and S2 normal.  Pulmonary:     Effort: Pulmonary effort is normal.     Breath sounds: Normal breath sounds.  Musculoskeletal:     Cervical back: Normal range of motion.  Skin:    General: Skin is warm and dry.  Neurological:     General: No focal deficit present.     Mental Status: She is alert.      Imaging: CLINICAL DATA:  Aortic aneurysm   EXAM: CT ANGIOGRAPHY CHEST WITH CONTRAST   TECHNIQUE: Multidetector CT imaging of the chest was performed using the standard protocol during bolus administration of intravenous contrast. Multiplanar CT image reconstructions and MIPs were obtained to evaluate the vascular anatomy.   RADIATION DOSE REDUCTION: This exam was performed according to the departmental dose-optimization program which includes automated exposure control, adjustment of the mA and/or kV according to patient size and/or use of iterative reconstruction technique.   CONTRAST:  75mL OMNIPAQUE IOHEXOL 350 MG/ML SOLN   COMPARISON:  None October 26, 2023  FINDINGS: Cardiovascular: Overall similar size of mildly dilated descending thoracic aorta, measuring up to 4.1 x 4.0 cm at the mid ascending aorta. Common origin of the brachiocephalic artery and left common carotid artery. Arch vessels are widely patent. Mild atherosclerotic plaque in the descending thoracic aorta.   Mediastinum/Nodes: No lymphadenopathy.   Lungs/Pleura: Similar appearance of linear opacities at the inferior right upper lobe, lingula, and bilateral lower lobes likely representing atelectasis. Overall similar size of 0.7 cm left upper lobe pulmonary nodule (CT 302: 43).  Additional tiny bilateral pulmonary nodules are unchanged. Emphysema.   Upper Abdomen: No acute findings.   Musculoskeletal: No acute osseous findings.   Review of the MIP images confirms the above findings.   IMPRESSION: 1. Overall similar size of dilated ascending thoracic aorta, measuring up to 4.1 cm. Recommend annual imaging followup by CTA or MRA. This recommendation follows 2010 ACCF/AHA/AATS/ACR/ASA/SCA/SCAI/SIR/STS/SVM Guidelines for the Diagnosis and Management of Patients with Thoracic Aortic Disease. Circulation. 2010; 121: Z733-z630. Aortic aneurysm NOS (ICD10-I71.9)   2. Stable 0.7 cm left upper lobe pulmonary nodule. Recommend continued attention on follow-up studies to ensure stability.   Aortic Atherosclerosis (ICD10-I70.0) and Emphysema (ICD10-J43.9).     Electronically Signed   By: Michaeline Blanch M.D.   On: 01/23/2024 10:12     A/P: Aneurysm of ascending aorta without rupture -4.1 cm ascending thoracic aortic aneurysm on CTA of chest. We discussed the natural history and and risk factors for growth of ascending aortic aneurysms. Discussed recommendations to minimize the risk of further expansion or dissection including careful blood pressure control, avoidance of contact sports and heavy lifting, attention to lipid management.  We covered the importance of continued smoking cessation.  The patient does not yet meet surgical criteria of >5.5cm. The patient is aware of signs and symptoms of aortic dissection and when to present to the emergency department  -Discussed elevated blood pressure readings with patient. She is willing to try lifestyle changes such as diet and exercise to help reduced blood pressure.  She should start BP log and record readings for continued monitoring.  If readings elevated 140s/90s consistently then she should report that to her PCP -Follow up in one year with CT of chest   Pulmonary Nodule -0.7 cm left upper lobe pulmonary nodule which  is stable in size -Will continue to follow with CT scans for aneurysm    Risk Modification:  Statin:  not currently prescribed  Smoking cessation instruction/counseling given:  commended patient for quitting and reviewed strategies for preventing relapses  Patient was counseled on importance of Blood Pressure Control  They are instructed to contact their Primary Care Physician if they start to have blood pressure readings over 130s/90s. Do not ever stop blood pressure medications on your own, unless instructed by healthcare professional.  Please avoid use of Fluoroquinolones as this can potentially increase your risk of Aortic Rupture and/or Dissection  Patient educated on signs and symptoms of Aortic Dissection, handout also provided in AVS  Manuelita CHRISTELLA Rough, PA-C 02/06/24

## 2024-02-07 ENCOUNTER — Ambulatory Visit

## 2024-02-07 ENCOUNTER — Telehealth: Payer: Self-pay | Admitting: *Deleted

## 2024-02-07 VITALS — BP 140/88 | HR 79 | Resp 18 | Ht 61.0 in | Wt 146.0 lb

## 2024-02-07 DIAGNOSIS — I7121 Aneurysm of the ascending aorta, without rupture: Secondary | ICD-10-CM | POA: Diagnosis not present

## 2024-02-07 NOTE — Telephone Encounter (Signed)
 Received VM from pt with complaint of redness and pain at lab phlebotomy site.  Per MD pt needing to send picture through mychart or offer [facility] visit.  RN attempt x1 to return call, no answer.  LVM.

## 2024-02-07 NOTE — Patient Instructions (Signed)

## 2024-02-09 ENCOUNTER — Ambulatory Visit: Admitting: Internal Medicine

## 2024-02-10 ENCOUNTER — Emergency Department (HOSPITAL_BASED_OUTPATIENT_CLINIC_OR_DEPARTMENT_OTHER)
Admission: EM | Admit: 2024-02-10 | Discharge: 2024-02-10 | Disposition: A | Discharge status: Home / Self Care | Attending: Emergency Medicine | Admitting: Emergency Medicine

## 2024-02-10 ENCOUNTER — Encounter (HOSPITAL_BASED_OUTPATIENT_CLINIC_OR_DEPARTMENT_OTHER): Payer: Self-pay | Admitting: Emergency Medicine

## 2024-02-10 ENCOUNTER — Other Ambulatory Visit: Payer: Self-pay

## 2024-02-10 DIAGNOSIS — R030 Elevated blood-pressure reading, without diagnosis of hypertension: Secondary | ICD-10-CM | POA: Insufficient documentation

## 2024-02-10 DIAGNOSIS — I10 Essential (primary) hypertension: Secondary | ICD-10-CM | POA: Diagnosis not present

## 2024-02-10 DIAGNOSIS — Z7982 Long term (current) use of aspirin: Secondary | ICD-10-CM | POA: Insufficient documentation

## 2024-02-10 NOTE — Discharge Instructions (Signed)
 Keep check on your blood pressures at home.  Write these down and follow-up within the next week with your primary care doctor to discuss the values.  Call your doctor on Monday if your pressures continue to be high over the weekend.  Return to the emergency room if you have any worsening symptoms.

## 2024-02-10 NOTE — ED Provider Notes (Signed)
 [place] EMERGENCY DEPARTMENT AT [facility] Provider Note   CSN: [id number] Arrival date & time: 02/10/24  1512     Patient presents with: Hypertension   Kara [name] is a 73 y.o. female.   Patient is a 73 year old female who presents with elevated blood pressure at home.  She said she checked her blood pressure this morning and it was 170 systolic.  She checked it because she had been feeling bad over the last couple days with some intermittent headaches and feeling a little wobbly.  She has not been having any dizziness.  No acute vision changes or speech deficits.  No numbness or weakness to her extremities.  No chest pain or shortness of breath.  When she checked her blood pressure this morning she got concerned and came here to the ED.  However her symptoms have resolved.  She says she is feeling much better.  She does not have a current headache.  She does not feel off balance.  She has had a longstanding history of some intermittent symptoms for the last 2 years.  She was not sure if they were related to possible shingles that she had had or medication that she is taking for her polycythemia vera.  She is followed by her PCP and her hematologist.  She is going to be referred to a rheumatologist to further evaluate her intermittent symptoms.  However the reason she came to the ED this morning is because of the intermittent headaches and feeling a bit wobbly with an elevated blood pressure.       Prior to Admission medications   Medication Sig Start Date End Date Taking? Authorizing Provider  Alpha-Lipoic Acid 600 MG CAPS Take 1 capsule (600 mg total) by mouth daily at 6 (six) AM. 11/22/23   Jesus Bernardino MATSU, MD  aspirin  EC 81 MG tablet Take 1 tablet (81 mg total) by mouth daily. 12/20/18   Gudena, Vinay, MD  ruxolitinib  phosphate (JAKAFI ) 5 MG tablet Take 1 tablet (5 mg total) by mouth 2 (two) times daily. 12/28/23   Gudena, Vinay, MD    Allergies: Quinolones, Demerol  [meperidine hcl], Penicillins, and Tramadol    Review of Systems  Constitutional:  Positive for fatigue. Negative for diaphoresis and fever.  HENT:  Negative for congestion and rhinorrhea.   Respiratory:  Negative for shortness of breath.   Cardiovascular:  Negative for chest pain and leg swelling.  Gastrointestinal:  Negative for abdominal pain, nausea and vomiting.  Genitourinary: Negative.   Musculoskeletal: Negative.   Neurological:  Positive for numbness (Has been having some numbness to the right side of the face for about 2 years which she thought was related to the shingles she had) and headaches. Negative for dizziness and weakness.    Updated Vital Signs BP 135/65 (BP Location: Right Arm)   Pulse 96   Temp 98 F (36.7 C) (Oral)   Resp 16   SpO2 96%   Physical Exam Constitutional:      Appearance: She is well-developed.  HENT:     Head: Normocephalic and atraumatic.  Eyes:     Pupils: Pupils are equal, round, and reactive to light.  Cardiovascular:     Rate and Rhythm: Normal rate and regular rhythm.     Heart sounds: Normal heart sounds.  Pulmonary:     Effort: Pulmonary effort is normal. No respiratory distress.     Breath sounds: Normal breath sounds. No wheezing or rales.  Chest:     Chest  wall: No tenderness.  Abdominal:     General: Bowel sounds are normal.     Palpations: Abdomen is soft.     Tenderness: There is no abdominal tenderness. There is no guarding or rebound.  Musculoskeletal:        General: Normal range of motion.     Cervical back: Normal range of motion and neck supple.  Lymphadenopathy:     Cervical: No cervical adenopathy.  Skin:    General: Skin is warm and dry.     Findings: No rash.  Neurological:     Mental Status: She is alert and oriented to person, place, and time.     Comments: Motor 5/5 all extremities Sensation grossly intact to LT all extremities  no pronator drift CN II-XII grossly intact Gait normal Visual fields  full to confrontation     (all labs ordered are listed, but only abnormal results are displayed) Labs Reviewed - No data to display  EKG: None  Radiology: No results found.   Procedures   Medications Ordered in the ED - No data to display                                  Medical Decision Making  Patient presents with concerns of elevated blood pressure.  Her blood pressure has improved here in the ED to 135/65.  She is currently not having any of the symptoms that she was previously having.  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  Given that her symptoms have resolved, she does not want to stay for any further treatment.  She had a CBC about 5 days ago that I reviewed.  Her platelet count was elevated as it is chronically but otherwise nonconcerning.  She had a normal creatinine checked in September.  The blood work done today was canceled per her request.  She was discharged home in good condition.  She did recently buy a blood pressure machine to check her blood pressures at home.  Today was the first time that she had used it.  I advised her to keep a record of her blood pressures and to discuss these with her doctor as to whether or not she needs to start on some blood pressure medication.  Given her blood pressure today, I feel it is premature to start antihypertensive medication.  I advised her that if it stays elevated over the weekend to call her doctor on Monday.  Return precautions were given.     Final diagnoses:  Elevated blood pressure reading    ED Discharge Orders     None          Lenor Hollering, MD 02/10/24 [phone]

## 2024-02-10 NOTE — ED Triage Notes (Signed)
 Woke up this AM with BP 170/xx  Felt like this AM her BP might be contributing to how she is feeling.  PMH: polycythemia and   hemochromatosis Feels like symptoms have been worse since singles last year  Everyday I wake up with a different symptoms This has been going on  for 2 years

## 2024-02-10 NOTE — ED Notes (Signed)
Declined EKG

## 2024-02-12 ENCOUNTER — Other Ambulatory Visit: Payer: Self-pay

## 2024-02-12 ENCOUNTER — Telehealth: Payer: Self-pay

## 2024-02-12 ENCOUNTER — Other Ambulatory Visit (HOSPITAL_COMMUNITY): Payer: Self-pay

## 2024-02-12 DIAGNOSIS — D473 Essential (hemorrhagic) thrombocythemia: Secondary | ICD-10-CM

## 2024-02-12 NOTE — Telephone Encounter (Signed)
 S/w patient regarding request to move up appointment Dr. Odean. Patient currently scheduled for 11/18 for labs and MD visit. At time of call, patient stated that she did not have access to her calendar to confirm offered appointment times for Thursday, 11/6. Patient states that she will call back when she is able to verify. Patient aware that team will be awaiting her message and will touch base again then.

## 2024-02-13 ENCOUNTER — Telehealth (HOSPITAL_COMMUNITY): Payer: Self-pay

## 2024-02-13 ENCOUNTER — Encounter: Payer: Self-pay | Admitting: *Deleted

## 2024-02-13 ENCOUNTER — Other Ambulatory Visit

## 2024-02-13 ENCOUNTER — Ambulatory Visit: Admitting: Hematology and Oncology

## 2024-02-13 NOTE — Progress Notes (Signed)
 Per MD request, referral successfully faxed to [facility] [phone]).

## 2024-02-13 NOTE — Telephone Encounter (Signed)
 Called to confirm/remind patient of their appointment at the [facility] on 02/14/2024 3:30.   Appointment:   [x] Confirmed  [] Left mess   [] No answer/No voice mail  [] VM Full/unable to leave message  [] Phone not in service  Patient reminded to bring all medications and/or complete list.  Confirmed patient has transportation. Gave directions, instructed to utilize valet parking.

## 2024-02-14 ENCOUNTER — Ambulatory Visit (HOSPITAL_COMMUNITY)
Admission: RE | Admit: 2024-02-14 | Discharge: 2024-02-14 | Disposition: A | Discharge status: Home / Self Care | Source: Ambulatory Visit | Attending: Physician Assistant | Admitting: Physician Assistant

## 2024-02-14 ENCOUNTER — Encounter (HOSPITAL_COMMUNITY): Payer: Self-pay

## 2024-02-14 VITALS — BP 138/86 | HR 74 | Ht 61.0 in | Wt 145.0 lb

## 2024-02-14 DIAGNOSIS — Z79899 Other long term (current) drug therapy: Secondary | ICD-10-CM | POA: Insufficient documentation

## 2024-02-14 DIAGNOSIS — R5383 Other fatigue: Secondary | ICD-10-CM | POA: Diagnosis not present

## 2024-02-14 DIAGNOSIS — R002 Palpitations: Secondary | ICD-10-CM | POA: Insufficient documentation

## 2024-02-14 DIAGNOSIS — I10 Essential (primary) hypertension: Secondary | ICD-10-CM | POA: Diagnosis present

## 2024-02-14 DIAGNOSIS — I7121 Aneurysm of the ascending aorta, without rupture: Secondary | ICD-10-CM | POA: Diagnosis not present

## 2024-02-14 DIAGNOSIS — Z87891 Personal history of nicotine dependence: Secondary | ICD-10-CM | POA: Insufficient documentation

## 2024-02-14 DIAGNOSIS — I471 Supraventricular tachycardia, unspecified: Secondary | ICD-10-CM | POA: Insufficient documentation

## 2024-02-14 DIAGNOSIS — D473 Essential (hemorrhagic) thrombocythemia: Secondary | ICD-10-CM | POA: Insufficient documentation

## 2024-02-14 MED ORDER — LOSARTAN POTASSIUM 25 MG PO TABS: 25.0000 mg | ORAL_TABLET | Freq: Every day | ORAL | 11 refills | Status: DC

## 2024-02-14 NOTE — Patient Instructions (Signed)
 START Losartan 25 mg daily.  Blood work in 2 weeks.  Please follow up with our heart failure pharmacist in 4 weeks.  Your physician recommends that you schedule a follow-up appointment in: 4 months (February  2026) ** PLEASE CALL THE OFFICE IN DECEMBER TO ARRANGE YOUR FOLLOW UP APPOINTMENT.**  If you have any questions or concerns before your next appointment please send us  a message through [facility] or call our office at [phone].    TO LEAVE A MESSAGE FOR THE NURSE SELECT OPTION 2, PLEASE LEAVE A MESSAGE INCLUDING: YOUR NAME DATE OF BIRTH CALL BACK NUMBER REASON FOR CALL**this is important as we prioritize the call backs  YOU WILL RECEIVE A CALL BACK THE SAME DAY AS LONG AS YOU CALL BEFORE 4:00 PM  At the [facility], you and your health needs are our priority. As part of our continuing mission to provide you with exceptional heart care, we have created designated Provider Care Teams. These Care Teams include your primary Cardiologist (physician) and Advanced Practice Providers (APPs- Physician Assistants and Nurse Practitioners) who all work together to provide you with the care you need, when you need it.   You may see any of the following providers on your designated Care Team at your next follow up: Dr Toribio Fuel Dr Ezra Shuck Dr. Morene Brownie Greig Mosses, NP Caffie Shed, [place] [facility] [place], [place] Beckey Coe, NP Jordan Lee, NP Ellouise Class, NP Tinnie Redman, PharmD Jaun Bash, PharmD   Please be sure to bring in all your medications bottles to every appointment.    Thank you for choosing  [facility]

## 2024-02-14 NOTE — Progress Notes (Signed)
 [facility] CL[facility] NOTE  Referring Physician:Dr. Odean Primary Care: Jesus Bernardino MATSU, MD Primary Cardiologist: Previously Dr. Cherrie  HPI: [name] is a 73 y/o woman with essential thrombocytosis, hereditary hemochromatosis, prior tobacco use, ascending aortic aneurysm (4.1 cm by CTA 10/25) followed by [facility].  Denies h/o known heart disease. Was started on anagrelide  in 2023 and developed palpitations:   Echo 09/06/20 EF 60-65% RVSP   Zio 8/23 - Sinus rhythm. 1 six-beat run NSVT, 180 runs of SVT longest 11.5 seconds. Rare PACs and PVCs  cMRI 02/21/22 EF 61% no LGE. No evidence of hemochromatosis.   Last seen in HF clinic in 12/23 for palpitations which had improved after stopping anagrelide .    Seen in ED a few days ago for HTN. Blood pressure up to 170 systolic at home. BP had significantly improved by the time she presented to the ED. She was discharged with instruction to follow-up with a provider as outpatient.  Here today for follow-up regarding high blood pressure. Reports her blood pressure has been averaging 150s systolic at home. Most recently 151/97 mmHg. She's also had elevated blood pressures at clinic visits. She thinks this may be contributing to recent headaches. No shortness of breath, chest pain or palpitations. No lower extremity edema. Reports gaining 20 lb after starting Jakafi . Notes a lot of daytime fatigue. Does not exercise routinely. She does not eat much salt. No tobacco or ETOH use.    Past Medical History:  Diagnosis Date   Allergy Unknown   Penecillin   Anemia Unknown   Review lab tests from [facility]   Aneurysm of ascending aorta without rupture    Aortic atherosclerosis    Cataract One month   Very early stages   Clotting disorder Unknown   Hemochromatosis & Jax2 Mutation   Essential thrombocytosis (HCC)    Hemochromatosis    Jax's mutation   History of smoking 25-50 pack years    Hypertension Unknown   Sometimes   Lung nodule     Orthostatic hypotension 05/20/2016   Other emphysema (HCC)    Sleep apnea Unknown   Probably due to collapsed septum    Current Outpatient Medications  Medication Sig Dispense Refill   aspirin  EC 81 MG tablet Take 1 tablet (81 mg total) by mouth daily.     losartan (COZAAR) 25 MG tablet Take 1 tablet (25 mg total) by mouth daily. 30 tablet 11   ruxolitinib  phosphate (JAKAFI ) 5 MG tablet Take 1 tablet (5 mg total) by mouth 2 (two) times daily. 60 tablet 3   Alpha-Lipoic Acid 600 MG CAPS Take 1 capsule (600 mg total) by mouth daily at 6 (six) AM. (Patient not taking: Reported on 02/14/2024) 90 capsule 3   Current Facility-Administered Medications  Medication Dose Route Frequency Provider Last Rate Last Admin   0.9 %  sodium chloride  infusion  500 mL Intravenous Once Armbruster, Elspeth SQUIBB, MD        Allergies  Allergen Reactions   Quinolones Other (See Comments)    Ascending thoracic aortic aneurysm   Demerol [Meperidine Hcl] Other (See Comments)    Passed out after surgery   Penicillins Rash   Tramadol Nausea And Vomiting      Social History   Socioeconomic History   Marital status: Single    Spouse name: Not on file   Number of children: 0   Years of education: college   Highest education level: Master's degree (e.g., MA, MS, MEng, MEd, MSW, MBA)  Occupational History   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    She works as a associate professor for [employer].   Highest level education:  Masters   Right-handed.   Caffeine use: 4 cups per day.      Are you right handed or left handed? Right Handed   Are you currently employed ? 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 No    Who lives with you? With mother    What type of home do you live in: 1 story or 2 story? Lives in a one story floor plan        Social Drivers of Health   Financial Resource Strain: Low Risk  (11/15/2023)   Overall Financial Resource Strain (CARDIA)    Difficulty of Paying Living Expenses: Not hard at all  Food Insecurity: No Food Insecurity (11/15/2023)   Hunger Vital Sign    Worried About Running Out of Food in the Last Year: Never true    Ran Out of Food in the Last Year: Never true  Transportation Needs: No Transportation Needs (11/15/2023)   PRAPARE - Administrator, Civil Service (Medical): No    Lack of Transportation (Non-Medical): No  Physical Activity: Inactive (12/22/2022)   Exercise Vital Sign    Days of Exercise per Week: 0 days    Minutes of Exercise per Session: 0 min  Stress: Stress Concern Present (12/22/2022)   [employer] of Occupational Health - Occupational Stress Questionnaire    Feeling of Stress : To some extent  Social Connections: Unknown (11/15/2023)   Social Connection and Isolation Panel    Frequency of Communication with Friends and Family: Never    Frequency of Social Gatherings with Friends and Family: Never    Attends Religious Services: Never    Database Administrator or Organizations: No    Attends Engineer, Structural: Not on file    Marital Status: Patient declined  Intimate Partner Violence: Not At Risk (11/24/2022)   Humiliation, Afraid, Rape, and Kick questionnaire    Fear of Current or Ex-Partner: No    Emotionally Abused: No    Physically Abused: No    Sexually Abused: No      Family History   Problem Relation Age of Onset   Arthritis Mother    Acute myelogenous leukemia Father    Bipolar disorder Sister    Graves' disease Sister    Heart disease Sister    Stroke Maternal Grandmother    Heart attack Maternal Grandfather    Heart disease Paternal Grandmother    Heart disease Paternal Grandfather    Diabetes Neg Hx    Cancer Neg Hx  Heart failure Neg Hx    Hyperlipidemia Neg Hx    Hypertension Neg Hx     Vitals:   02/14/24 1552  BP: 138/86  Pulse: 74  SpO2: 96%  Weight: 65.8 kg (145 lb)  Height: 5' 1 (1.549 m)      PHYSICAL EXAM: General:  Well appearing. Cor: No JVD. Regular rate & rhythm. No murmurs. Lungs: clear Abdomen: soft, nontender, nondistended.  Extremities: no edema Neuro: alert & orientedx3. Affect pleasant   ASSESSMENT & PLAN:  HTN -Consistently elevated blood pressure readings on home monitoring and at clinic visits. -Start losartan 25 mg daily -BMET 2 weeks -Goal BP < 120/80 -Has not completed sleep study ordered at previous visit. She does not feel she has significant sleep apnea. -Discussed lifestyle changes including diet, exercise, weight management to control blood pressure.  2. Palpitations with frequent SVT on the ambulatory monitoring - Echo 5/22/122 EF 60-65% RVSP  - Zio 8/23 - Sinus rhythm. 1 six-beat run NSVT, 180 runs of SVT longest 11.5 seconds (mostly 3-7 beats). Rare PACs and PVCs - cMRI 02/21/22 EF 61% no LGE. No evidence of hemochromatosis.  - Angrelide can cause multiple cardiac side effects but I am not certain that angrelide causing this degree of SVT - Palpitations much improved after stopping anagrelide   - TSH normal  3. Hemochromatosis - cMRI no evidence of hemochromatosis  4. Ascending aortic aneurysm -4.1 cm on CTA chest 10/25 -Follows with [facility] -Need to maintain BP control  Follow-up: 4 weeks with PharmD for medication titration then with Dr. Bensimhon. Can consider graduating to General  Cardiology in the future.  Jazion Atteberry N, PA-C  4:46 PM

## 2024-02-22 ENCOUNTER — Inpatient Hospital Stay: Attending: Hematology and Oncology | Admitting: Hematology and Oncology

## 2024-02-22 DIAGNOSIS — I10 Essential (primary) hypertension: Secondary | ICD-10-CM | POA: Insufficient documentation

## 2024-02-22 DIAGNOSIS — D45 Polycythemia vera: Secondary | ICD-10-CM | POA: Insufficient documentation

## 2024-02-22 DIAGNOSIS — B029 Zoster without complications: Secondary | ICD-10-CM | POA: Diagnosis not present

## 2024-02-22 DIAGNOSIS — B0229 Other postherpetic nervous system involvement: Secondary | ICD-10-CM | POA: Insufficient documentation

## 2024-02-22 DIAGNOSIS — D75839 Thrombocytosis, unspecified: Secondary | ICD-10-CM | POA: Diagnosis not present

## 2024-02-22 DIAGNOSIS — Z79899 Other long term (current) drug therapy: Secondary | ICD-10-CM

## 2024-02-22 DIAGNOSIS — D473 Essential (hemorrhagic) thrombocythemia: Secondary | ICD-10-CM | POA: Diagnosis not present

## 2024-02-22 DIAGNOSIS — G4700 Insomnia, unspecified: Secondary | ICD-10-CM | POA: Insufficient documentation

## 2024-02-22 NOTE — Progress Notes (Signed)
 HEMATOLOGY-ONCOLOGY TELEPHONE VISIT PROGRESS NOTE  I connected with our patient on 02/22/24 at 10:15 AM EST by telephone and verified that I am speaking with the correct person using two identifiers.  I discussed the limitations, risks, security and privacy concerns of performing an evaluation and management service by telephone and the availability of in person appointments.  I also discussed with the patient that there may be a patient responsible charge related to this service. The patient expressed understanding and agreed to proceed.   History of Present Illness:  F/U to discuss her BP issues History of Present Illness Kara [name] is a 73 year old female with hypertension and polycythemia vera who presents with improved symptoms after starting losartan.  She experiences significant improvement in energy levels and focus after starting losartan, with blood pressure readings now lower than previous elevated levels. She has polycythemia vera and is currently on Jakafi , which may contribute to increased blood pressure. She is considering switching back to hydroxyurea , which she previously tolerated well except for hair loss. She continues to experience post-herpetic neuralgia with swelling and numbness in her earlobe and neck, as well as tinnitus, which she associates with her history of shingles.      REVIEW OF SYSTEMS:   Constitutional: Denies fevers, chills or abnormal weight loss All other systems were reviewed with the patient and are negative. Observations/Objective:     Assessment Plan:  Essential thrombocytosis (HCC) 06/18/21:  Hb 11.2, Platelet 778 07/30/2021: Hemoglobin 12.9, platelets 783, iron saturation 13%, ferritin 7  06/28/2023: Platelets 668, iron saturation 52%, ferritin 16 10/11/2024: Platelets 648, iron saturation 56%, ferritin 59 12/12/2023: Platelets 732, iron saturation 42%, ferritin 68   01/02/24: Platelets 746, Iron sat: 42%, Ferritin: 65    Current treatment: Since  the patient is intolerant to anagrelide  and hydroxyurea , currently on Jakafi  at the low-dose of 5 mg twice a day. Started 09/18/2022   Jakafi  toxicities: unclear if its causing headaches or hypertension Patient is contemplating on stopping Jakafi  and going on Hydrea . She will think about it and decide  Facial numbness: Saw neurology Dr.Patel: MRI brain: 08/28/2023: No acute findings. Pain on skull and neck: went to chiropractor: Dr.Spell did alignment of skull and pain has resolved. Insomnia: intermittent   Relapse shingles episodes: Waxing and waning symptoms: completed Valcyclovir 01/17/24 -01/27/24 (did not improve her symptoms) She went on a diet and lost 10 pounds. Hemochromatosis: On periodic phlebotomies.    Hypertension: Went to emergency room 02/10/2024.  Followed with cardiology started on losartan.Remarkable improvement in her headaches. Improvement in her balance.  Keep follow up appts in 2 weeks    I discussed the assessment and treatment plan with the patient. The patient was provided an opportunity to ask questions and all were answered. The patient agreed with the plan and demonstrated an understanding of the instructions. The patient was advised to call back or seek an in-person evaluation if the symptoms worsen or if the condition fails to improve as anticipated.   I provided 20 minutes of non-face-to-face time during this encounter.  This includes time for charting and coordination of care   Naomi MARLA Chad, MD

## 2024-02-22 NOTE — Assessment & Plan Note (Signed)
 06/18/21:  Hb 11.2, Platelet 778 07/30/2021: Hemoglobin 12.9, platelets 783, iron saturation 13%, ferritin 7  06/28/2023: Platelets 668, iron saturation 52%, ferritin 16 10/11/2024: Platelets 648, iron saturation 56%, ferritin 59 12/12/2023: Platelets 732, iron saturation 42%, ferritin 68   01/02/24: Platelets 746, Iron sat: 42%, Ferritin: 65    Current treatment: Since the patient is intolerant to anagrelide  and hydroxyurea , currently on Jakafi  at the low-dose of 5 mg twice a day. Started 09/18/2022   Jakafi  toxicities: Denies any major adverse effects to Jakafi  Facial numbness: Saw neurology Dr.Patel: MRI brain: 08/28/2023: No acute findings. Pain on skull and neck: went to chiropractor: Dr.Spell did alignment of skull and pain has resolved. Insomnia: intermittent   Relapse shingles episodes: Waxing and waning symptoms She went on a diet and lost 10 pounds. Hemochromatosis: On periodic phlebotomies.    Hypertension: Went to emergency room 02/10/2024.  Followed with cardiology started on losartan.

## 2024-02-27 ENCOUNTER — Other Ambulatory Visit: Payer: Self-pay | Admitting: *Deleted

## 2024-02-27 DIAGNOSIS — D473 Essential (hemorrhagic) thrombocythemia: Secondary | ICD-10-CM

## 2024-02-28 ENCOUNTER — Ambulatory Visit (HOSPITAL_COMMUNITY)

## 2024-02-29 ENCOUNTER — Encounter: Payer: Self-pay | Admitting: Hematology and Oncology

## 2024-03-05 ENCOUNTER — Inpatient Hospital Stay (HOSPITAL_BASED_OUTPATIENT_CLINIC_OR_DEPARTMENT_OTHER): Admitting: Hematology and Oncology

## 2024-03-05 ENCOUNTER — Inpatient Hospital Stay

## 2024-03-05 VITALS — BP 121/81 | HR 72 | Temp 97.6°F | Resp 18 | Ht 61.0 in | Wt 137.6 lb

## 2024-03-05 DIAGNOSIS — D75839 Thrombocytosis, unspecified: Secondary | ICD-10-CM | POA: Diagnosis not present

## 2024-03-05 DIAGNOSIS — D473 Essential (hemorrhagic) thrombocythemia: Secondary | ICD-10-CM | POA: Diagnosis not present

## 2024-03-05 DIAGNOSIS — B0229 Other postherpetic nervous system involvement: Secondary | ICD-10-CM | POA: Diagnosis not present

## 2024-03-05 DIAGNOSIS — I10 Essential (primary) hypertension: Secondary | ICD-10-CM | POA: Diagnosis not present

## 2024-03-05 DIAGNOSIS — D45 Polycythemia vera: Secondary | ICD-10-CM | POA: Diagnosis not present

## 2024-03-05 DIAGNOSIS — G4700 Insomnia, unspecified: Secondary | ICD-10-CM | POA: Diagnosis not present

## 2024-03-05 LAB — CMP (CANCER CENTER ONLY)
ALT: 12 U/L (ref 0–44)
AST: 23 U/L (ref 15–41)
Albumin: 4.8 g/dL (ref 3.5–5.0)
Alkaline Phosphatase: 59 U/L (ref 38–126)
Anion gap: 10 (ref 5–15)
BUN: 8 mg/dL (ref 8–23)
CO2: 22 mmol/L (ref 22–32)
Calcium: 9.6 mg/dL (ref 8.9–10.3)
Chloride: 100 mmol/L (ref 98–111)
Creatinine: 0.5 mg/dL (ref 0.44–1.00)
GFR, Estimated: >60 mL/min (ref >60)
Glucose, Bld: 89 mg/dL (ref 70–99)
Potassium: 4.2 mmol/L (ref 3.5–5.1)
Sodium: 132 mmol/L — ABNORMAL LOW (ref 135–145)
Total Bilirubin: 0.4 mg/dL (ref 0.0–1.2)
Total Protein: 7.3 g/dL (ref 6.5–8.1)

## 2024-03-05 LAB — CBC WITH DIFFERENTIAL (CANCER CENTER ONLY)
Abs Immature Granulocytes: 0.03 K/uL (ref 0.00–0.07)
Basophils Absolute: 0 K/uL (ref 0.0–0.1)
Basophils Relative: 0 %
Eosinophils Absolute: 0.3 K/uL (ref 0.0–0.5)
Eosinophils Relative: 4 %
HCT: 38.9 % (ref 36.0–46.0)
Hemoglobin: 13.3 g/dL (ref 12.0–15.0)
Immature Granulocytes: 0 %
Lymphocytes Relative: 12 %
Lymphs Abs: 1.1 K/uL (ref 0.7–4.0)
MCH: 31.9 pg (ref 26.0–34.0)
MCHC: 34.2 g/dL (ref 30.0–36.0)
MCV: 93.3 fL (ref 80.0–100.0)
Monocytes Absolute: 0.8 K/uL (ref 0.1–1.0)
Monocytes Relative: 8 %
Neutro Abs: 7 K/uL (ref 1.7–7.7)
Neutrophils Relative %: 76 %
Platelet Count: 798 K/uL — ABNORMAL HIGH (ref 150–400)
RBC: 4.17 MIL/uL (ref 3.87–5.11)
RDW: 14.7 % (ref 11.5–15.5)
WBC Count: 9.3 K/uL (ref 4.0–10.5)
nRBC: 0 % (ref 0.0–0.2)

## 2024-03-05 LAB — IRON AND IRON BINDING CAPACITY (CC-WL,HP ONLY)
Iron: 77 ug/dL (ref 28–170)
Saturation Ratios: 28 % (ref 10.4–31.8)
TIBC: 272 ug/dL (ref 250–450)
UIBC: 195 ug/dL (ref 148–442)

## 2024-03-05 LAB — FERRITIN: Ferritin: 55 ng/mL (ref 11–307)

## 2024-03-05 NOTE — Progress Notes (Signed)
 Patient Care Team: Jesus Bernardino MATSU, MD as PCP - General (Internal Medicine) Odean Potts, MD as Consulting Physician (Hematology and Oncology) Bensimhon, Toribio SAUNDERS, MD as Consulting Physician (Cardiology) Patel, Donika K, DO as Consulting Physician (Neurology)  DIAGNOSIS:  Encounter Diagnosis  Name Primary?   Essential thrombocytosis (HCC) Yes     CHIEF COMPLIANT: Follow-up of essential thrombocytosis and hemochromatosis  HISTORY OF PRESENT ILLNESS:   History of Present Illness Kara [name] is a 73 year old female with hypertension and thrombocytosis who presents for follow-up of her blood pressure and platelet count management.  Her energy levels have improved significantly, and she no longer experiences exhaustion. She regularly monitors her blood pressure, with a recent reading of 121/81 mmHg. She has switched to decaffeinated coffee and takes losartan in the morning with water and apple cider vinegar.  She manages thrombocytosis with Jakafi  (ruxolitinib ) taken twice daily and aspirin  with the first dose. Her platelet count has increased to 798,000/L from 680,000/L a month ago. She experiences dryness of the skin and nails, which she attributes to Jakafi .  Her heart palpitations have resolved, and she is sleeping better. She has lost eight pounds since October 31st and feels her physical appearance has improved.     ALLERGIES:  is allergic to quinolones, demerol [meperidine hcl], penicillins, and tramadol.  MEDICATIONS:  Current Outpatient Medications  Medication Sig Dispense Refill   aspirin  EC 81 MG tablet Take 1 tablet (81 mg total) by mouth daily.     losartan (COZAAR) 25 MG tablet Take 1 tablet (25 mg total) by mouth daily. 30 tablet 11   ruxolitinib  phosphate (JAKAFI ) 5 MG tablet Take 1 tablet (5 mg total) by mouth 2 (two) times daily. 60 tablet 3   Alpha-Lipoic Acid 600 MG CAPS Take 1 capsule (600 mg total) by mouth daily at 6 (six) AM. (Patient not taking: Reported  on 03/05/2024) 90 capsule 3   Current Facility-Administered Medications  Medication Dose Route Frequency Provider Last Rate Last Admin   0.9 %  sodium chloride  infusion  500 mL Intravenous Once Armbruster, Elspeth SQUIBB, MD        PHYSICAL EXAMINATION: ECOG PERFORMANCE STATUS: 1 - Symptomatic but completely ambulatory  Vitals:   03/05/24 1048  BP: 121/81  Pulse: 72  Resp: 18  Temp: 97.6 F (36.4 C)  SpO2: 97%   Filed Weights   03/05/24 1048  Weight: 137 lb 9.6 oz (62.4 kg)     LABORATORY DATA:  I have reviewed the data as listed    Latest Ref Rng & Units 03/05/2024   10:35 AM 01/02/2024    2:23 PM 12/12/2023    2:33 PM  CMP  Glucose 70 - 99 mg/dL 89  896  91   BUN 8 - 23 mg/dL 8  16  17    Creatinine 0.44 - 1.00 mg/dL 9.49  9.34  9.13   Sodium 135 - 145 mmol/L 132  140  137   Potassium 3.5 - 5.1 mmol/L 4.2  4.1  4.3   Chloride 98 - 111 mmol/L 100  107  107   CO2 22 - 32 mmol/L 22  27  24    Calcium 8.9 - 10.3 mg/dL 9.6  9.4  9.2   Total Protein 6.5 - 8.1 g/dL 7.3  7.3  6.6   Total Bilirubin 0.0 - 1.2 mg/dL 0.4  0.3  0.3   Alkaline Phos 38 - 126 U/L 59  71  53   AST 15 - 41 U/L  23  20  19    ALT 0 - 44 U/L 12  14  9      Lab Results  Component Value Date   WBC 9.3 03/05/2024   HGB 13.3 03/05/2024   HCT 38.9 03/05/2024   MCV 93.3 03/05/2024   PLT 798 (H) 03/05/2024   NEUTROABS 7.0 03/05/2024    ASSESSMENT & PLAN:  Essential thrombocytosis (HCC) 06/18/21:  Hb 11.2, Platelet 778 07/30/2021: Hemoglobin 12.9, platelets 783, iron saturation 13%, ferritin 7  06/28/2023: Platelets 668, iron saturation 52%, ferritin 16 10/11/2024: Platelets 648, iron saturation 56%, ferritin 59 12/12/2023: Platelets 732, iron saturation 42%, ferritin 68   01/02/24: Platelets 746, Iron sat: 42%, Ferritin: 65 03/05/2024: Platelets 798, iron saturation 28%, ferritin pending    Current treatment: Since the patient is intolerant to anagrelide  and hydroxyurea , currently on Jakafi  at the low-dose of 5  mg twice a day. Started 09/18/2022   We are contemplating on discontinuing Jakafi  and switching her to Hydrea . Recheck labs in a month and then make a decision.   Facial numbness: Saw neurology Dr.Patel: MRI brain: 08/28/2023: No acute findings. Pain on skull and neck: went to chiropractor: Dr.Spell did alignment of skull and pain has resolved. Insomnia: intermittent   Relapse shingles episodes: Waxing and waning symptoms: completed Valcyclovir 01/17/24 -01/27/24 (did not improve her symptoms) She went on a diet and lost 10 pounds. Hemochromatosis: On periodic phlebotomies.     Hypertension: Went to emergency room 02/10/2024.  Followed with cardiology started on losartan.Remarkable improvement in her headaches. Improvement in her balance.  This is also remarkably improved her energy levels.   Follow-up back in 1 month with labs     Orders Placed This Encounter  Procedures   CBC with Differential ([facility] Only)    Standing Status:   Future    Expiration Date:   03/05/2025   Ferritin    Standing Status:   Future    Expiration Date:   03/05/2025   Iron and Iron Binding Capacity ([facility] only)    Standing Status:   Future    Expiration Date:   03/05/2025   The patient has a good understanding of the overall plan. she agrees with it. she will call with any problems that may develop before the next visit here.  I personally spent a total of 30 minutes in the care of the patient today including preparing to see the patient, getting/reviewing separately obtained history, performing a medically appropriate exam/evaluation, counseling and educating, placing orders, referring and communicating with other health care professionals, documenting clinical information in the EHR, independently interpreting results, communicating results, and coordinating care.   Viinay K Brittie Whisnant, MD 03/05/24

## 2024-03-05 NOTE — Assessment & Plan Note (Signed)
 06/18/21:  Hb 11.2, Platelet 778 07/30/2021: Hemoglobin 12.9, platelets 783, iron saturation 13%, ferritin 7  06/28/2023: Platelets 668, iron saturation 52%, ferritin 16 10/11/2024: Platelets 648, iron saturation 56%, ferritin 59 12/12/2023: Platelets 732, iron saturation 42%, ferritin 68   01/02/24: Platelets 746, Iron sat: 42%, Ferritin: 65    Current treatment: Since the patient is intolerant to anagrelide  and hydroxyurea , currently on Jakafi  at the low-dose of 5 mg twice a day. Started 09/18/2022   Jakafi  toxicities: unclear if its causing headaches or hypertension Patient is contemplating on stopping Jakafi  and going on Hydrea . She will think about it and decide   Facial numbness: Saw neurology Dr.Patel: MRI brain: 08/28/2023: No acute findings. Pain on skull and neck: went to chiropractor: Dr.Spell did alignment of skull and pain has resolved. Insomnia: intermittent   Relapse shingles episodes: Waxing and waning symptoms: completed Valcyclovir 01/17/24 -01/27/24 (did not improve her symptoms) She went on a diet and lost 10 pounds. Hemochromatosis: On periodic phlebotomies.     Hypertension: Went to emergency room 02/10/2024.  Followed with cardiology started on losartan.Remarkable improvement in her headaches. Improvement in her balance.   Keep follow up appts in 2 weeks

## 2024-03-11 ENCOUNTER — Telehealth (HOSPITAL_COMMUNITY): Payer: Self-pay | Admitting: Cardiology

## 2024-03-11 ENCOUNTER — Ambulatory Visit (HOSPITAL_COMMUNITY)

## 2024-03-11 NOTE — Telephone Encounter (Signed)
 Patient called to report elevated BP would like to know if meds can be adjusted   Reports she would like to come in to speak with a provider in person to discuss further  Add on 11/26 @ 9

## 2024-03-12 ENCOUNTER — Ambulatory Visit (HOSPITAL_COMMUNITY)

## 2024-03-12 ENCOUNTER — Telehealth (HOSPITAL_COMMUNITY): Payer: Self-pay

## 2024-03-12 NOTE — Progress Notes (Incomplete)
 ADVANCED HF CLINIC NOTE  Referring Physician:Dr. Odean Primary Care: Jesus Bernardino MATSU, MD Primary Cardiologist: Previously Dr. Cherrie  HPI: Kara [name] is a 73 y/o woman with essential thrombocytosis, hereditary hemochromatosis, prior tobacco use, ascending aortic aneurysm (4.1 cm by CTA 10/25) followed by [facility].  Denies h/o known heart disease. Was started on anagrelide  in 2023 and developed palpitations:   Echo 09/06/20 EF 60-65% RVSP   Zio 8/23 - Sinus rhythm. 1 six-beat run NSVT, 180 runs of SVT longest 11.5 seconds. Rare PACs and PVCs  cMRI 02/21/22 EF 61% no LGE. No evidence of hemochromatosis.   Last seen in HF clinic in 12/23 for palpitations which had improved after stopping anagrelide .    Seen in ED a few days ago for HTN. Blood pressure up to 170 systolic at home. BP had significantly improved by the time she presented to the ED. She was discharged with instruction to follow-up with a provider as outpatient.  Here today for follow-up regarding high blood pressure. Reports her blood pressure has been averaging 150s systolic at home. Most recently 151/97 mmHg. She's also had elevated blood pressures at clinic visits. She thinks this may be contributing to recent headaches. No shortness of breath, chest pain or palpitations. No lower extremity edema. Reports gaining 20 lb after starting Jakafi . Notes a lot of daytime fatigue. Does not exercise routinely. She does not eat much salt. No tobacco or ETOH use.    Past Medical History:  Diagnosis Date   Allergy Unknown   Penecillin   Anemia Unknown   Review lab tests from [facility]   Aneurysm of ascending aorta without rupture    Aortic atherosclerosis    Cataract One month   Very early stages   Clotting disorder Unknown   Hemochromatosis & Jax2 Mutation   Essential thrombocytosis (HCC)    Hemochromatosis    Jax's mutation   History of smoking 25-50 pack years    Hypertension Unknown   Sometimes   Lung nodule     Orthostatic hypotension 05/20/2016   Other emphysema (HCC)    Sleep apnea Unknown   Probably due to collapsed septum    Current Outpatient Medications  Medication Sig Dispense Refill   Alpha-Lipoic Acid 600 MG CAPS Take 1 capsule (600 mg total) by mouth daily at 6 (six) AM. (Patient not taking: Reported on 03/05/2024) 90 capsule 3   aspirin  EC 81 MG tablet Take 1 tablet (81 mg total) by mouth daily.     losartan  (COZAAR ) 25 MG tablet Take 1 tablet (25 mg total) by mouth daily. 30 tablet 11   ruxolitinib  phosphate (JAKAFI ) 5 MG tablet Take 1 tablet (5 mg total) by mouth 2 (two) times daily. 60 tablet 3   Current Facility-Administered Medications  Medication Dose Route Frequency Provider Last Rate Last Admin   0.9 %  sodium chloride  infusion  500 mL Intravenous Once Armbruster, Elspeth SQUIBB, MD        Allergies  Allergen Reactions   Quinolones Other (See Comments)    Ascending thoracic aortic aneurysm   Demerol [Meperidine Hcl] Other (See Comments)    Passed out after surgery   Penicillins Rash   Tramadol Nausea And Vomiting      Social History   Socioeconomic History   Marital status: Single    Spouse name: Not on file   Number of children: 0   Years of education: college   Highest education level: Master's degree (e.g., MA, Kara, MEng, MEd, MSW, MBA)  Occupational History   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    She works as a associate professor for [employer].   Highest level education:  Masters   Right-handed.   Caffeine use: 4 cups per day.      Are you right handed or left handed? Right Handed   Are you currently employed ? 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 No    Who lives with you? With mother    What type of home do you live in: 1 story or 2 story? Lives in a one story floor plan        Social Drivers of Health   Financial Resource Strain: Low Risk  (11/15/2023)   Overall Financial Resource Strain (CARDIA)    Difficulty of Paying Living Expenses: Not hard at all  Food Insecurity: No Food Insecurity (11/15/2023)   Hunger Vital Sign    Worried About Running Out of Food in the Last Year: Never true    Ran Out of Food in the Last Year: Never true  Transportation Needs: No Transportation Needs (11/15/2023)   PRAPARE - Administrator, Civil Service (Medical): No    Lack of Transportation (Non-Medical): No  Physical Activity: Inactive (12/22/2022)   Exercise Vital Sign    Days of Exercise per Week: 0 days    Minutes of Exercise per Session: 0 min  Stress: Stress Concern Present (12/22/2022)   [employer] of Occupational Health - Occupational Stress Questionnaire    Feeling of Stress : To some extent  Social Connections: Unknown (11/15/2023)   Social Connection and Isolation Panel    Frequency of Communication with Friends and Family: Never    Frequency of Social Gatherings with Friends and Family: Never    Attends Religious Services: Never    Database Administrator or Organizations: No    Attends Engineer, Structural: Not on file    Marital Status: Patient declined  Intimate Partner Violence: Not At Risk (11/24/2022)   Humiliation, Afraid, Rape, and Kick questionnaire    Fear of Current or Ex-Partner: No    Emotionally Abused: No    Physically Abused: No    Sexually Abused: No      Family History   Problem Relation Age of Onset   Arthritis Mother    Acute myelogenous leukemia Father    Bipolar disorder Sister    Graves' disease Sister    Heart disease Sister    Stroke Maternal Grandmother    Heart attack Maternal Grandfather    Heart disease Paternal Grandmother    Heart disease Paternal Grandfather    Diabetes Neg Hx    Cancer Neg Hx  Heart failure Neg Hx    Hyperlipidemia Neg Hx    Hypertension Neg Hx     There were no vitals filed for this visit.     PHYSICAL EXAM: General:  Well appearing. Cor: No JVD. Regular rate & rhythm. No murmurs. Lungs: clear Abdomen: soft, nontender, nondistended.  Extremities: no edema Neuro: alert & orientedx3. Affect pleasant   ASSESSMENT & PLAN:  HTN -Consistently elevated blood pressure readings on home monitoring and at clinic visits. -Start losartan  25 mg daily -BMET 2 weeks -Goal BP < 120/80 -Has not completed sleep study ordered at previous visit. She does not feel she has significant sleep apnea. -Discussed lifestyle changes including diet, exercise, weight management to control blood pressure.  2. Palpitations with frequent SVT on the ambulatory monitoring - Echo 5/22/122 EF 60-65% RVSP  - Zio 8/23 - Sinus rhythm. 1 six-beat run NSVT, 180 runs of SVT longest 11.5 seconds (mostly 3-7 beats). Rare PACs and PVCs - cMRI 02/21/22 EF 61% no LGE. No evidence of hemochromatosis.  - Angrelide can cause multiple cardiac side effects but I am not certain that angrelide causing this degree of SVT - Palpitations much improved after stopping anagrelide   - TSH normal  3. Hemochromatosis - cMRI no evidence of hemochromatosis  4. Ascending aortic aneurysm -4.1 cm on CTA chest 10/25 -Follows with [facility] -Need to maintain BP control  Follow-up: 4 weeks with PharmD for medication titration then with Dr. Bensimhon. Can consider graduating to General Cardiology in the future.  Harlene CHRISTELLA Gainer, FNP  9:34 AM

## 2024-03-12 NOTE — Telephone Encounter (Signed)
 Called to confirm/remind patient of their appointment at the [facility] on 03/13/24.   Appointment:   [] Confirmed  [x] Left mess   [] No answer/No voice mail  [] VM Full/unable to leave message  [] Phone not in service  And to bring in all medications and/or complete list.

## 2024-03-13 ENCOUNTER — Ambulatory Visit (HOSPITAL_COMMUNITY)
Admission: RE | Admit: 2024-03-13 | Discharge: 2024-03-13 | Disposition: A | Source: Ambulatory Visit | Attending: Internal Medicine | Admitting: Internal Medicine

## 2024-03-13 ENCOUNTER — Encounter (HOSPITAL_COMMUNITY): Payer: Self-pay

## 2024-03-13 ENCOUNTER — Ambulatory Visit (HOSPITAL_COMMUNITY)
Admission: RE | Admit: 2024-03-13 | Discharge: 2024-03-13 | Disposition: A | Discharge status: Home / Self Care | Source: Ambulatory Visit | Attending: Cardiology | Admitting: Cardiology

## 2024-03-13 ENCOUNTER — Ambulatory Visit (HOSPITAL_COMMUNITY)

## 2024-03-13 ENCOUNTER — Other Ambulatory Visit (HOSPITAL_COMMUNITY): Payer: Self-pay | Admitting: Internal Medicine

## 2024-03-13 ENCOUNTER — Telehealth: Payer: Self-pay | Admitting: *Deleted

## 2024-03-13 ENCOUNTER — Other Ambulatory Visit: Payer: Self-pay | Admitting: *Deleted

## 2024-03-13 VITALS — BP 140/80 | HR 67 | Ht 61.0 in | Wt 135.2 lb

## 2024-03-13 DIAGNOSIS — I10 Essential (primary) hypertension: Secondary | ICD-10-CM | POA: Diagnosis not present

## 2024-03-13 DIAGNOSIS — D473 Essential (hemorrhagic) thrombocythemia: Secondary | ICD-10-CM | POA: Diagnosis not present

## 2024-03-13 DIAGNOSIS — Z79899 Other long term (current) drug therapy: Secondary | ICD-10-CM | POA: Diagnosis not present

## 2024-03-13 DIAGNOSIS — D75839 Thrombocytosis, unspecified: Secondary | ICD-10-CM | POA: Diagnosis not present

## 2024-03-13 DIAGNOSIS — I7121 Aneurysm of the ascending aorta, without rupture: Secondary | ICD-10-CM | POA: Diagnosis not present

## 2024-03-13 DIAGNOSIS — Z87891 Personal history of nicotine dependence: Secondary | ICD-10-CM | POA: Insufficient documentation

## 2024-03-13 DIAGNOSIS — I471 Supraventricular tachycardia, unspecified: Secondary | ICD-10-CM | POA: Insufficient documentation

## 2024-03-13 DIAGNOSIS — R002 Palpitations: Secondary | ICD-10-CM | POA: Diagnosis not present

## 2024-03-13 MED ORDER — LOSARTAN POTASSIUM 25 MG PO TABS: 37.5000 mg | ORAL_TABLET | Freq: Every day | ORAL | 9 refills | Status: AC

## 2024-03-13 NOTE — Telephone Encounter (Signed)
Received VM from pt.  RN attempt x1 to return call, no answer. LVM for pt to return call to the office.

## 2024-03-13 NOTE — Patient Instructions (Addendum)
 Good  to see you today!  INCREASE losartan  to 37.5 mg (1 1/2 tablets) daily  Lab work in 2 weeks as scheduled  Your provider has recommended that  you wear a Zio Patch for 14 days.  This monitor will record your heart rhythm for our review.  IF you have any symptoms while wearing the monitor please press the button.  If you have any issues with the patch or you notice a red or orange light on it please call the company at [phone].  Once you remove the patch please mail it back to the company as soon as possible so we can get the results.  Your physician recommends that you schedule a follow-up appointment in: 2 months(January) Call office in December to schedule an appointment  If you have any questions or concerns before your next appointment please send us  a message through [place] or call our office at [phone].    TO LEAVE A MESSAGE FOR THE NURSE SELECT OPTION 2, PLEASE LEAVE A MESSAGE INCLUDING: YOUR NAME DATE OF BIRTH CALL BACK NUMBER REASON FOR CALL**this is important as we prioritize the call backs  YOU WILL RECEIVE A CALL BACK THE SAME DAY AS LONG AS YOU CALL BEFORE 4:00 PM At the [facility], you and your health needs are our priority. As part of our continuing mission to provide you with exceptional heart care, we have created designated Provider Care Teams. These Care Teams include your primary Cardiologist (physician) and Advanced Practice Providers (APPs- Physician Assistants and Nurse Practitioners) who all work together to provide you with the care you need, when you need it.   You may see any of the following providers on your designated Care Team at your next follow up: Dr Toribio Fuel Dr Ezra Shuck Dr. Morene Brownie Greig Mosses, NP Caffie Shed, [place] [facility], [place] Beckey Coe, NP Jordan Lee, NP Ellouise Class, NP Tinnie Redman, PharmD Jaun Bash, PharmD   Please be sure to bring in all your medications  bottles to every appointment.    Thank you for choosing [facility]

## 2024-03-13 NOTE — Progress Notes (Signed)
 Received call from pt stating Ami Canny, PA with [facility] is unable to get pt into the office until July 2026.  States if referral is re-sent as Urgent, they can get her in sooner.  RN successfully re-faxed referral and market id as urgent. Patient contacted.

## 2024-03-13 NOTE — Progress Notes (Addendum)
 ADVANCED HF CLINIC NOTE  Primary Care: Jesus Bernardino MATSU, MD Primary Cardiologist: Previously Dr. Cherrie  HPI: Ms Thurman is a 73 y/o woman with essential thrombocytosis, hereditary hemochromatosis, prior tobacco use, ascending aortic aneurysm (4.1 cm by CTA 10/25) followed by [facility].  Denies h/o known heart disease. Was started on anagrelide  in 2023 and developed palpitations:   Echo 09/06/20 EF 60-65% RVSP   Zio 8/23 - Sinus rhythm. 1 six-beat run NSVT, 180 runs of SVT longest 11.5 seconds. Rare PACs and PVCs  cMRI 02/21/22 EF 61% no LGE. No evidence of hemochromatosis.   Last seen in HF clinic in 12/23 for palpitations which had improved after stopping anagrelide .    Seen in ED a few days ago for HTN. Blood pressure up to 170 systolic at home. BP had significantly improved by the time she presented to the ED. She was discharged with instruction to follow-up with a provider as outpatient.  She returns today for HTN follow up. Overall feeling better now on the losartan . Review BP log, intermittently SBP in 160s, mostly in 140s. Reports palpitations and occasional headaches, although has been under lots of stress as she just lost her mother. She denies CP. She would like to eventually get off of the Jakafi . Able to perform ADLs. Appetite okay. Compliant with all medications. Has cut out caffeine. No ETOH/tobacco.  Past Medical History:  Diagnosis Date   Allergy Unknown   Penecillin   Anemia Unknown   Review lab tests from [facility]   Aneurysm of ascending aorta without rupture    Aortic atherosclerosis    Cataract One month   Very early stages   Clotting disorder Unknown   Hemochromatosis & Jax2 Mutation   Essential thrombocytosis (HCC)    Hemochromatosis    Jax's mutation   History of smoking 25-50 pack years    Hypertension Unknown   Sometimes   Lung nodule    Orthostatic hypotension 05/20/2016   Other emphysema (HCC)    Sleep apnea Unknown   Probably due to collapsed  septum    Current Outpatient Medications  Medication Sig Dispense Refill   aspirin  EC 81 MG tablet Take 1 tablet (81 mg total) by mouth daily.     losartan  (COZAAR ) 25 MG tablet Take 1 tablet (25 mg total) by mouth daily. 30 tablet 11   ruxolitinib  phosphate (JAKAFI ) 5 MG tablet Take 1 tablet (5 mg total) by mouth 2 (two) times daily. 60 tablet 3   Alpha-Lipoic Acid 600 MG CAPS Take 1 capsule (600 mg total) by mouth daily at 6 (six) AM. (Patient not taking: Reported on 03/13/2024) 90 capsule 3   Current Facility-Administered Medications  Medication Dose Route Frequency Provider Last Rate Last Admin   0.9 %  sodium chloride  infusion  500 mL Intravenous Once Armbruster, Elspeth SQUIBB, MD        Allergies  Allergen Reactions   Quinolones Other (See Comments)    Ascending thoracic aortic aneurysm   Demerol [Meperidine Hcl] Other (See Comments)    Passed out after surgery   Penicillins Rash   Tramadol Nausea And Vomiting      Social History   Socioeconomic History   Marital status: Single    Spouse name: Not on file   Number of children: 0   Years of education: college   Highest education level: Master's degree (e.g., MA, MS, MEng, MEd, MSW, MBA)  Occupational History   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    She works as a associate professor for [employer].   Highest level education:  Masters    Right-handed.   Caffeine use: 4 cups per day.      Are you right handed or left handed? Right Handed   Are you currently employed ? 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 No    Who lives with you? With mother    What type of home do you live in: 1 story or 2 story? Lives in a one story floor plan        Social Drivers of Health   Financial Resource Strain: Low Risk  (11/15/2023)   Overall Financial Resource Strain (CARDIA)    Difficulty of Paying Living Expenses: Not hard at all  Food Insecurity: No Food Insecurity (11/15/2023)   Hunger Vital Sign    Worried About Running Out of Food in the Last Year: Never true    Ran Out of Food in the Last Year: Never true  Transportation Needs: No Transportation Needs (11/15/2023)   PRAPARE - Administrator, Civil Service (Medical): No    Lack of Transportation (Non-Medical): No  Physical Activity: Inactive (12/22/2022)   Exercise Vital Sign    Days of Exercise per Week: 0 days    Minutes of Exercise per Session: 0 min  Stress: Stress Concern Present (12/22/2022)   [employer] of Occupational Health - Occupational Stress Questionnaire    Feeling of Stress : To some extent  Social Connections: Unknown (11/15/2023)   Social Connection and Isolation Panel    Frequency of Communication with Friends and Family: Never    Frequency of Social Gatherings with Friends and Family: Never    Attends Religious Services: Never    Database Administrator or Organizations: No    Attends Engineer, Structural: Not on file    Marital Status: Patient declined  Intimate Partner Violence: Not At Risk (11/24/2022)   Humiliation, Afraid, Rape, and Kick questionnaire    Fear of Current or Ex-Partner: No    Emotionally Abused: No    Physically Abused: No    Sexually Abused: No      Family History  Problem Relation Age of Onset   Arthritis Mother    Acute myelogenous leukemia Father    Bipolar  disorder Sister    Graves' disease Sister    Heart disease Sister    Stroke Maternal Grandmother    Heart attack Maternal Grandfather    Heart disease Paternal Grandmother    Heart disease Paternal Grandfather    Diabetes Neg Hx    Cancer Neg Hx    Heart failure Neg Hx    Hyperlipidemia Neg Hx    Hypertension  Neg Hx     Vitals:   03/13/24 1034  BP: (!) 140/80  Pulse: 67  SpO2: 94%  Weight: 61.3 kg (135 lb 3.2 oz)  Height: 5' 1 (1.549 m)    Filed Weights   03/13/24 1034  Weight: 61.3 kg (135 lb 3.2 oz)    PHYSICAL EXAM: General: Well appearing. No distress  Cardiac: JVP flat . No murmurs  Extremities: Warm and dry.  No edema.  Neuro: A&O x3. Affect pleasant.   ASSESSMENT & PLAN:  HTN - BP readings on log have improved but remain elevated. BP today prior to medications. - increase losartan  to 37.5 mg daily. BMET in 2 weeks - Goal BP < 120/80 - Has not completed sleep study ordered at previous visit. She does not feel she has significant sleep apnea.  2. SVT/Palpitations  - Echo 5/22 EF 60-65% RVSP  - Zio 8/23 - SR. 6 beat run NSVT, 180 runs of SVT longest 11.5 seconds (mostly 3-7 beats). Rare PVCs - CMR 11/23 EF 61% no LGE. No evidence of hemochromatosis.  - previously on anagrelide , palpitations improved after coming off - now having palpitations again intermittently; regular on exam - place 14d Zio  3. Thrombocytosis - cMRI no evidence of hemochromatosis - on jakafi ; last PLT 798k  4. Ascending aortic aneurysm - 4.1 cm on CTA chest 10/25 - Follows with [facility] - Need to maintain BP control  Follow up in 2 months with Dr. Cherrie. Can consider graduating to General Cardiology in the future.  Kymora Sciara, NP  10:44 AM

## 2024-03-15 ENCOUNTER — Other Ambulatory Visit (HOSPITAL_COMMUNITY): Payer: Self-pay

## 2024-03-18 ENCOUNTER — Ambulatory Visit: Admitting: Internal Medicine

## 2024-03-18 ENCOUNTER — Encounter: Payer: Self-pay | Admitting: Hematology and Oncology

## 2024-03-18 ENCOUNTER — Telehealth: Payer: Self-pay

## 2024-03-18 DIAGNOSIS — R5383 Other fatigue: Secondary | ICD-10-CM

## 2024-03-18 NOTE — Telephone Encounter (Signed)
 Pt called and asks if we can order thyroid  testing when she comes in, and also asks for sooner appt than 12/16. Pt denies calling her PCP to ask about thyroid  testing. She suspects the tingling in her face and fatigue could be hypothyroidism. Advised pt Dr Odean can order the labs but will be routed to Dr Jesus as he is her PCP and would be responsible for treating this, since we see her for anemia.   She is understanding of this and was also advised that unfortunately Dr Odean does not have sooner appts available. She is agreeable to keep the appts she has.

## 2024-03-25 ENCOUNTER — Telehealth: Payer: Self-pay | Admitting: Internal Medicine

## 2024-03-25 DIAGNOSIS — G629 Polyneuropathy, unspecified: Secondary | ICD-10-CM | POA: Diagnosis not present

## 2024-03-25 NOTE — Telephone Encounter (Signed)
 Called patient regarding changing her AWV and she wanted to have someone call her regarding scheduling a thyroid  check please.  Tried to transfer but line was busy.  Thank you Darice FORBES [place] [facility] AWV TEAM Direct Dial [phone]

## 2024-03-26 ENCOUNTER — Telehealth: Payer: Self-pay

## 2024-03-26 ENCOUNTER — Other Ambulatory Visit: Payer: Self-pay

## 2024-03-26 ENCOUNTER — Telehealth (HOSPITAL_COMMUNITY): Payer: Self-pay | Admitting: Internal Medicine

## 2024-03-26 ENCOUNTER — Ambulatory Visit

## 2024-03-26 VITALS — Ht 60.0 in | Wt 132.0 lb

## 2024-03-26 DIAGNOSIS — R635 Abnormal weight gain: Secondary | ICD-10-CM

## 2024-03-26 DIAGNOSIS — Z0000 Encounter for general adult medical examination without abnormal findings: Secondary | ICD-10-CM | POA: Diagnosis not present

## 2024-03-26 DIAGNOSIS — R61 Generalized hyperhidrosis: Secondary | ICD-10-CM

## 2024-03-26 DIAGNOSIS — D473 Essential (hemorrhagic) thrombocythemia: Secondary | ICD-10-CM

## 2024-03-26 DIAGNOSIS — I700 Atherosclerosis of aorta: Secondary | ICD-10-CM

## 2024-03-26 NOTE — Telephone Encounter (Signed)
 placed

## 2024-03-26 NOTE — Progress Notes (Signed)
 Chief Complaint  Patient presents with   Medicare Wellness     Subjective:   Kara [name] is a 73 y.o. female who presents for a Medicare Annual Wellness Visit.  Visit info / Clinical Intake: Medicare Wellness Visit Type:: Subsequent Annual Wellness Visit Persons participating in visit and providing information:: patient Medicare Wellness Visit Mode:: Telephone If telephone:: video declined Since this visit was completed virtually, some vitals may be partially provided or unavailable. Missing vitals are due to the limitations of the virtual format.: Unable to obtain vitals - no equipment If Telephone or Video please confirm:: I connected with patient using audio/video enable telemedicine. I verified patient identity with two identifiers, discussed telehealth limitations, and patient agreed to proceed. Patient Location:: home Provider Location:: office Interpreter Needed?: No Pre-visit prep was completed: yes AWV questionnaire completed by patient prior to visit?: no Living arrangements:: (!) lives alone Patient's Overall Health Status Rating: good Typical amount of pain: none Does pain affect daily life?: no Are you currently prescribed opioids?: no  Dietary Habits and Nutritional Risks How many meals a day?: 3 Eats fruit and vegetables daily?: yes Most meals are obtained by: preparing own meals In the last 2 weeks, have you had any of the following?: none Diabetic:: no  Functional Status Activities of Daily Living (to include ambulation/medication): Independent Ambulation: Independent with device- listed below Home Assistive Devices/Equipment: Eyeglasses Medication Administration: Independent Home Management (perform basic housework or laundry): Independent Manage your own finances?: yes Primary transportation is: driving Concerns about vision?: no *vision screening is required for WTM* Concerns about hearing?: no  Fall Screening Falls in the past year?: 0 Number of  falls in past year: 0 Was there an injury with Fall?: 0 Fall Risk Category Calculator: 0 Patient Fall Risk Level: Low Fall Risk  Fall Risk Patient at Risk for Falls Due to: No Fall Risks Fall risk Follow up: Falls evaluation completed  Home and Transportation Safety: All rugs have non-skid backing?: N/A, no rugs All stairs or steps have railings?: N/A, no stairs Grab bars in the bathtub or shower?: yes Have non-skid surface in bathtub or shower?: yes Good home lighting?: yes Regular seat belt use?: yes Hospital stays in the last year:: no  Cognitive Assessment Difficulty concentrating, remembering, or making decisions? : no Will 6CIT or Mini Cog be Completed: yes What year is it?: 0 points What month is it?: 0 points Give patient an address phrase to remember (5 components): [address] [place]  About what time is it?: 0 points Count backwards from 20 to 1: 0 points Say the months of the year in reverse: 0 points Repeat the address phrase from earlier: 0 points 6 CIT Score: 0 points  Advance Directives (For Healthcare) Does Patient Have a Medical Advance Directive?: No Would patient like information on creating a medical advance directive?: No - Patient declined  Reviewed/Updated  Reviewed/Updated: Reviewed All (Medical, Surgical, Family, Medications, Allergies, Care Teams, Patient Goals)    Allergies (verified) Quinolones, Demerol [meperidine hcl], Penicillins, and Tramadol   Current Medications (verified) Outpatient Encounter Medications as of 03/26/2024  Medication Sig   aspirin  EC 81 MG tablet Take 1 tablet (81 mg total) by mouth daily.   losartan  (COZAAR ) 25 MG tablet Take 1.5 tablets (37.5 mg total) by mouth daily.   ruxolitinib  phosphate (JAKAFI ) 5 MG tablet Take 1 tablet (5 mg total) by mouth 2 (two) times daily.   Alpha-Lipoic Acid 600 MG CAPS Take 1 capsule (600 mg total) by mouth  daily at 6 (six) AM. (Patient not taking: Reported on 03/13/2024)    Facility-Administered Encounter Medications as of 03/26/2024  Medication   0.9 %  sodium chloride  infusion    History: Past Medical History:  Diagnosis Date   Allergy Unknown   Penecillin   Anemia Unknown   Review lab tests from [facility]   Aneurysm of ascending aorta without rupture    Aortic atherosclerosis    Cataract One month   Very early stages   Clotting disorder Unknown   Hemochromatosis & Jax2 Mutation   Essential thrombocytosis (HCC)    Hemochromatosis    Jax's mutation   History of smoking 25-50 pack years    Hypertension Unknown   Sometimes   Lung nodule    Orthostatic hypotension 05/20/2016   Other emphysema (HCC)    Sleep apnea Unknown   Probably due to collapsed septum   Past Surgical History:  Procedure Laterality Date   BREAST BIOPSY Left 12/29/2022   US  LT BREAST BX W LOC DEV 1ST LESION IMG BX SPEC US  GUIDE 12/29/2022 [facility]   BREAST BIOPSY Right 12/29/2022   US  RT BREAST BX W LOC DEV EA ADD LESION IMG BX SPEC US  GUIDE 12/29/2022 [facility]   BREAST BIOPSY Right 12/29/2022   US  RT BREAST BX W LOC DEV 1ST LESION IMG BX SPEC US  GUIDE 12/29/2022 [facility]   COSMETIC SURGERY  Mar 07 2022   Facial   NASAL SEPTUM SURGERY     Family History  Problem Relation Age of Onset   Arthritis Mother    Acute myelogenous leukemia Father    Bipolar disorder Sister    Graves' disease Sister    Heart disease Sister    Stroke Maternal Grandmother    Heart attack Maternal Grandfather    Heart disease Paternal Grandmother    Heart disease Paternal Grandfather    Diabetes Neg Hx    Cancer Neg Hx    Heart failure Neg Hx    Hyperlipidemia Neg Hx    Hypertension Neg Hx    Social History   Occupational History   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  Tobacco Use   Smoking status: Former    Current packs/day: 0.00    Average packs/day: 2.0 packs/day for 30.0 years (60.0 ttl pk-yrs)    Types: Cigarettes    Start date: 04/18/1990    Quit  date: 04/18/2020    Years since quitting: 3.9   Smokeless tobacco: Never   Tobacco comments:    Quit  Vaping Use   Vaping status: Never Used  Substance and Sexual Activity   Alcohol use: Not Currently    Comment: No alcohol approx 13 yrs   Drug use: No   Sexual activity: Not Currently   Tobacco Counseling Counseling given: Not Answered Tobacco comments: Quit  SDOH Screenings   Food Insecurity: No Food Insecurity (03/26/2024)  Housing: Low Risk  (03/26/2024)  Transportation Needs: No Transportation Needs (03/26/2024)  Utilities: Not At Risk (03/26/2024)  Depression (PHQ2-9): Low Risk  (03/26/2024)  Financial Resource Strain: Low Risk  (11/15/2023)  Physical Activity: Insufficiently Active (03/26/2024)  Social Connections: Socially Isolated (03/26/2024)  Stress: No Stress Concern Present (03/26/2024)  Tobacco Use: Medium Risk (03/26/2024)  Health Literacy: Adequate Health Literacy (03/26/2024)   See flowsheets for full screening details  Depression Screen PHQ 2 & 9 Depression Scale- Over the past 2 weeks, how often have you been bothered by any of the following problems? Little interest or pleasure  in doing things: 0 Feeling down, depressed, or hopeless (PHQ Adolescent also includes...irritable): 0 PHQ-2 Total Score: 0     Goals Addressed               This Visit's Progress     maintain health (pt-stated)               Objective:    Today's Vitals   03/26/24 1046  Weight: 132 lb (59.9 kg)  Height: 5' (1.524 m)   Body mass index is 25.78 kg/m.  Hearing/Vision screen Hearing Screening - Comments:: Pt denies any hearing issues  Vision Screening - Comments:: Wears rx glasses - up to date with routine eye exams with Dr Juvenal @ [facility]  Immunizations and Health Maintenance Health Maintenance  Topic Date Due   Hepatitis C Screening  Never done   Pneumococcal Vaccine: 50+ Years (1 of 2 - PCV) Never done   Zoster Vaccines- Shingrix (1 of 2) Never done    COVID-19 Vaccine (5 - 2025-26 season) 12/18/2023   Influenza Vaccine  07/16/2024 (Originally 11/17/2023)   Mammogram  12/02/2024   Lung Cancer Screening  01/22/2025   Medicare Annual Wellness (AWV)  03/26/2025   Colonoscopy  04/09/2030   Bone Density Scan  Completed   Meningococcal B Vaccine  Aged Out   DTaP/Tdap/Td  Discontinued        Assessment/Plan:  This is a routine wellness examination for Kara [name].  Patient Care Team: Jesus Bernardino MATSU, MD as PCP - General (Internal Medicine) Odean Potts, MD as Consulting Physician (Hematology and Oncology) Bensimhon, Toribio SAUNDERS, MD as Consulting Physician (Cardiology) Patel, Donika K, DO as Consulting Physician (Neurology)  I have personally reviewed and noted the following in the patient's chart:   Medical and social history Use of alcohol, tobacco or illicit drugs  Current medications and supplements including opioid prescriptions. Functional ability and status Nutritional status Physical activity Advanced directives List of other physicians Hospitalizations, surgeries, and ER visits in previous 12 months Vitals Screenings to include cognitive, depression, and falls Referrals and appointments  No orders of the defined types were placed in this encounter.  In addition, I have reviewed and discussed with patient certain preventive protocols, quality metrics, and best practice recommendations. A written personalized care plan for preventive services as well as general preventive health recommendations were provided to patient.   Ellouise VEAR Haws, LPN   [id number]/[id number]   Return in 1 year (on 03/26/2025).  After Visit Summary: (MyChart) Due to this being a telephonic visit, the after visit summary with patients personalized plan was offered to patient via MyChart   Nurse Notes: none

## 2024-03-26 NOTE — Patient Instructions (Signed)
 Ms. Losasso,  Thank you for taking the time for your Medicare Wellness Visit. I appreciate your continued commitment to your health goals. Please review the care plan we discussed, and feel free to reach out if I can assist you further.  Please note that Annual Wellness Visits do not include a physical exam. Some assessments may be limited, especially if the visit was conducted virtually. If needed, we may recommend an in-person follow-up with your provider.  Ongoing Care Seeing your primary care provider every 3 to 6 months helps us  monitor your health and provide consistent, personalized care.   Referrals If a referral was made during today's visit and you haven't received any updates within two weeks, please contact the referred provider directly to check on the status.  Recommended Screenings:  Health Maintenance  Topic Date Due   Hepatitis C Screening  Never done   Pneumococcal Vaccine for age over 88 (1 of 2 - PCV) Never done   Zoster (Shingles) Vaccine (1 of 2) Never done   Flu Shot  11/17/2023   Medicare Annual Wellness Visit  11/24/2023   COVID-19 Vaccine (5 - 2025-26 season) 12/18/2023   Breast Cancer Screening  12/02/2024   Screening for Lung Cancer  01/22/2025   Colon Cancer Screening  04/09/2030   Osteoporosis screening with Bone Density Scan  Completed   Meningitis B Vaccine  Aged Out   DTaP/Tdap/Td vaccine  Discontinued       03/05/2024   10:46 AM  Advanced Directives  Does Patient Have a Medical Advance Directive? No  Would patient like information on creating a medical advance directive? No - Patient declined    Vision: Annual vision screenings are recommended for early detection of glaucoma, cataracts, and diabetic retinopathy. These exams can also reveal signs of chronic conditions such as diabetes and high blood pressure.  Dental: Annual dental screenings help detect early signs of oral cancer, gum disease, and other conditions linked to overall health,  including heart disease and diabetes.  Please see the attached documents for additional preventive care recommendations.

## 2024-03-26 NOTE — Telephone Encounter (Signed)
 Copied from CRM #[id number]. Topic: Clinical - Lab/Test Results >> Mar 26, 2024  9:47 AM Ashley R wrote: Reason for CRM: Requesting thyroid  levels checked before 1/8 appt to review.  Ok to place orders for future labs to be reviewed at [facility] on 1/8?

## 2024-03-27 ENCOUNTER — Ambulatory Visit (HOSPITAL_COMMUNITY)
Admission: RE | Admit: 2024-03-27 | Discharge: 2024-03-27 | Disposition: A | Discharge status: Home / Self Care | Source: Ambulatory Visit | Attending: Cardiology | Admitting: Cardiology

## 2024-03-27 ENCOUNTER — Ambulatory Visit (HOSPITAL_COMMUNITY): Payer: Self-pay | Admitting: Cardiology

## 2024-03-27 DIAGNOSIS — I10 Essential (primary) hypertension: Secondary | ICD-10-CM | POA: Diagnosis not present

## 2024-03-27 LAB — BASIC METABOLIC PANEL WITH GFR
Anion gap: 8 (ref 5–15)
BUN: 9 mg/dL (ref 8–23)
CO2: 23 mmol/L (ref 22–32)
Calcium: 9.3 mg/dL (ref 8.9–10.3)
Chloride: 106 mmol/L (ref 98–111)
Creatinine, Ser: 0.56 mg/dL (ref 0.44–1.00)
GFR, Estimated: >60 mL/min (ref >60)
Glucose, Bld: 101 mg/dL — ABNORMAL HIGH (ref 70–99)
Potassium: 4.2 mmol/L (ref 3.5–5.1)
Sodium: 137 mmol/L (ref 135–145)

## 2024-04-02 ENCOUNTER — Inpatient Hospital Stay: Admitting: Hematology and Oncology

## 2024-04-02 ENCOUNTER — Inpatient Hospital Stay: Attending: Hematology and Oncology

## 2024-04-02 VITALS — BP 136/60 | HR 106 | Temp 98.0°F | Resp 19 | Wt 137.6 lb

## 2024-04-02 DIAGNOSIS — D473 Essential (hemorrhagic) thrombocythemia: Secondary | ICD-10-CM | POA: Insufficient documentation

## 2024-04-02 DIAGNOSIS — D75839 Thrombocytosis, unspecified: Secondary | ICD-10-CM | POA: Diagnosis not present

## 2024-04-02 DIAGNOSIS — I10 Essential (primary) hypertension: Secondary | ICD-10-CM | POA: Diagnosis not present

## 2024-04-02 DIAGNOSIS — B029 Zoster without complications: Secondary | ICD-10-CM | POA: Insufficient documentation

## 2024-04-02 DIAGNOSIS — R200 Anesthesia of skin: Secondary | ICD-10-CM | POA: Diagnosis not present

## 2024-04-02 DIAGNOSIS — Z79899 Other long term (current) drug therapy: Secondary | ICD-10-CM | POA: Diagnosis not present

## 2024-04-02 DIAGNOSIS — R5383 Other fatigue: Secondary | ICD-10-CM

## 2024-04-02 LAB — CBC WITH DIFFERENTIAL (CANCER CENTER ONLY)
Abs Immature Granulocytes: 0.04 K/uL (ref 0.00–0.07)
Basophils Absolute: 0 K/uL (ref 0.0–0.1)
Basophils Relative: 0 %
Eosinophils Absolute: 0.3 K/uL (ref 0.0–0.5)
Eosinophils Relative: 3 %
HCT: 38 % (ref 36.0–46.0)
Hemoglobin: 12.9 g/dL (ref 12.0–15.0)
Immature Granulocytes: 0 %
Lymphocytes Relative: 16 %
Lymphs Abs: 1.8 K/uL (ref 0.7–4.0)
MCH: 32 pg (ref 26.0–34.0)
MCHC: 33.9 g/dL (ref 30.0–36.0)
MCV: 94.3 fL (ref 80.0–100.0)
Monocytes Absolute: 0.7 K/uL (ref 0.1–1.0)
Monocytes Relative: 6 %
Neutro Abs: 8.1 K/uL — ABNORMAL HIGH (ref 1.7–7.7)
Neutrophils Relative %: 75 %
Platelet Count: 905 K/uL (ref 150–400)
RBC: 4.03 MIL/uL (ref 3.87–5.11)
RDW: 14.6 % (ref 11.5–15.5)
WBC Count: 10.9 K/uL — ABNORMAL HIGH (ref 4.0–10.5)
nRBC: 0 % (ref 0.0–0.2)

## 2024-04-02 LAB — IRON AND IRON BINDING CAPACITY (CC-WL,HP ONLY)
Iron: 55 ug/dL (ref 28–170)
Saturation Ratios: 20 % (ref 10.4–31.8)
TIBC: 276 ug/dL (ref 250–450)
UIBC: 221 ug/dL

## 2024-04-02 LAB — TSH: TSH: 1.05 u[IU]/mL (ref 0.350–4.500)

## 2024-04-02 LAB — FERRITIN: Ferritin: 82 ng/mL (ref 11–307)

## 2024-04-02 LAB — T4, FREE: Free T4: 1.17 ng/dL (ref 0.80–2.00)

## 2024-04-02 MED ORDER — HYDROXYUREA 500 MG PO CAPS: 500.0000 mg | ORAL_CAPSULE | Freq: Every day | ORAL | 1 refills | Status: AC

## 2024-04-02 NOTE — Assessment & Plan Note (Signed)
 06/18/21:  Hb 11.2, Platelet 778 07/30/2021: Hemoglobin 12.9, platelets 783, iron saturation 13%, ferritin 7  06/28/2023: Platelets 668, iron saturation 52%, ferritin 16 10/11/2024: Platelets 648, iron saturation 56%, ferritin 59 12/12/2023: Platelets 732, iron saturation 42%, ferritin 68   01/02/24: Platelets 746, Iron sat: 42%, Ferritin: 65 03/05/2024: Platelets 798, iron saturation 28%, ferritin pending    Current treatment: Since the patient is intolerant to anagrelide  and hydroxyurea , currently on Jakafi  at the low-dose of 5 mg twice a day. Started 09/18/2022   We are contemplating on discontinuing Jakafi  and switching her to Hydrea . Recheck labs in a month and then make a decision.   Facial numbness: Saw neurology Dr.Patel: MRI brain: 08/28/2023: No acute findings. Pain on skull and neck: went to chiropractor: Dr.Spell did alignment of skull and pain has resolved. Insomnia: intermittent   Relapse shingles episodes: Waxing and waning symptoms: completed Valcyclovir 01/17/24 -01/27/24 (did not improve her symptoms) She went on a diet and lost 10 pounds. Hemochromatosis: On periodic phlebotomies.     Hypertension: Went to emergency room 02/10/2024.  Followed with cardiology started on losartan .Remarkable improvement in her headaches. Improvement in her balance.  This is also remarkably improved her energy levels.   Follow-up back in 1 month with labs

## 2024-04-02 NOTE — Progress Notes (Signed)
 Patient Care Team: Jesus Bernardino MATSU, MD as PCP - General (Internal Medicine) Odean Potts, MD as Consulting Physician (Hematology and Oncology) Bensimhon, Toribio SAUNDERS, MD as Consulting Physician (Cardiology) Patel, Donika K, DO as Consulting Physician (Neurology)  DIAGNOSIS:  Encounter Diagnosis  Name Primary?   Essential thrombocytosis (HCC) Yes     CHIEF COMPLIANT: Follow-up of essential thrombocytosis  HISTORY OF PRESENT ILLNESS:   History of Present Illness Kara [name] is a 73 year old woman with essential thrombocythemia who presents for evaluation of rising platelet counts despite cytoreductive therapy.  She has had a rapid rise in platelet counts, now 905 x10^9/L from 600 and 798 x10^9/L in recent weeks, though she feels less symptomatic than with prior elevations.  Originally hydroxyurea  was stopped due to hair loss and marked dryness, anagrelide  was stopped for severe palpitations  , and ruxolitinib  has caused increased blood pressure that improved after starting losartan  and persistent dryness of skin and nails. She continues daily aspirin .  About one week before this visit she had a one-day episode of cold sweats, feeling very cold, and tingling and numbness of the face, hands, and feet, which fully resolved and has not recurred.  She recently had facial laser resurfacing with inflammatory-appearing skin changes but no infection, and she wonders if this or post-procedural inflammation contributed to a transient rise in white blood cell and platelet counts.  She is under significant emotional stress after her mother's recent death and responsibilities managing the estate but remains functional in work and daily activities.        ALLERGIES:  is allergic to quinolones, demerol [meperidine hcl], penicillins, and tramadol.  MEDICATIONS:  Current Outpatient Medications  Medication Sig Dispense Refill   hydroxyurea  (HYDREA ) 500 MG capsule Take 1 capsule (500 mg total) by  mouth daily. May take with food to minimize GI side effects. 30 capsule 1   Alpha-Lipoic Acid 600 MG CAPS Take 1 capsule (600 mg total) by mouth daily at 6 (six) AM. (Patient not taking: Reported on 03/13/2024) 90 capsule 3   aspirin  EC 81 MG tablet Take 1 tablet (81 mg total) by mouth daily.     losartan  (COZAAR ) 25 MG tablet Take 1.5 tablets (37.5 mg total) by mouth daily. 60 tablet 9   ruxolitinib  phosphate (JAKAFI ) 5 MG tablet Take 1 tablet (5 mg total) by mouth 2 (two) times daily. 60 tablet 3   Current Facility-Administered Medications  Medication Dose Route Frequency Provider Last Rate Last Admin   0.9 %  sodium chloride  infusion  500 mL Intravenous Once Armbruster, Elspeth SQUIBB, MD        PHYSICAL EXAMINATION: ECOG PERFORMANCE STATUS: 1 - Symptomatic but completely ambulatory  Vitals:   04/02/24 1503  BP: 136/60  Pulse: (!) 106  Resp: 19  Temp: 98 F (36.7 C)  SpO2: 95%   Filed Weights   04/02/24 1503  Weight: 137 lb 9.6 oz (62.4 kg)      LABORATORY DATA:  I have reviewed the data as listed    Latest Ref Rng & Units 03/27/2024    9:29 AM 03/05/2024   10:35 AM 01/02/2024    2:23 PM  CMP  Glucose 70 - 99 mg/dL 898  89  896   BUN 8 - 23 mg/dL 9  8  16    Creatinine 0.44 - 1.00 mg/dL 9.43  9.49  9.34   Sodium 135 - 145 mmol/L 137  132  140   Potassium 3.5 - 5.1 mmol/L 4.2  4.2  4.1   Chloride 98 - 111 mmol/L 106  100  107   CO2 22 - 32 mmol/L 23  22  27    Calcium 8.9 - 10.3 mg/dL 9.3  9.6  9.4   Total Protein 6.5 - 8.1 g/dL  7.3  7.3   Total Bilirubin 0.0 - 1.2 mg/dL  0.4  0.3   Alkaline Phos 38 - 126 U/L  59  71   AST 15 - 41 U/L  23  20   ALT 0 - 44 U/L  12  14     Lab Results  Component Value Date   WBC 10.9 (H) 04/02/2024   HGB 12.9 04/02/2024   HCT 38.0 04/02/2024   MCV 94.3 04/02/2024   PLT 905 (HH) 04/02/2024   NEUTROABS 8.1 (H) 04/02/2024    ASSESSMENT & PLAN:  Essential thrombocytosis (HCC) 06/18/21:  Hb 11.2, Platelet 778 07/30/2021: Hemoglobin  12.9, platelets 783, iron saturation 13%, ferritin 7  06/28/2023: Platelets 668, iron saturation 52%, ferritin 16 10/11/2024: Platelets 648, iron saturation 56%, ferritin 59 12/12/2023: Platelets 732, iron saturation 42%, ferritin 68   01/02/24: Platelets 746, Iron sat: 42%, Ferritin: 65 03/05/2024: Platelets 798, iron saturation 28%, ferritin pending 04/02/2024: Platelets 905    Current treatment:  , currently on Jakafi  at the low-dose of 5 mg twice a day. Started 09/18/2022, recommended discontinuing Jakafi  and switching to hydroxyurea  finder milligrams a day, continue with aspirin  daily   Facial numbness: Saw neurology Dr.Patel: MRI brain: 08/28/2023: No acute findings. Pain on skull and neck: went to chiropractor: Dr.Spell did alignment of skull and pain has resolved. Insomnia: intermittent   Relapse shingles episodes: Waxing and waning symptoms: completed Valcyclovir 01/17/24 -01/27/24 (did not improve her symptoms) She went on a diet and lost 10 pounds. Hemochromatosis: On periodic phlebotomies.     Hypertension: Went to emergency room 02/10/2024.  Followed with cardiology started on losartan .Remarkable improvement in her headaches. Improvement in her balance.  This is also remarkably improved her energy levels.   Her mom passed away 3 weeks ago. In 14-Aug-2023 she is thinking of moving to [place] or [place]. Follow-up back in 2 weeks with labs       No orders of the defined types were placed in this encounter.  The patient has a good understanding of the overall plan. she agrees with it. she will call with any problems that may develop before the next visit here.  I personally spent a total of 30 minutes in the care of the patient today including preparing to see the patient, getting/reviewing separately obtained history, performing a medically appropriate exam/evaluation, counseling and educating, placing orders, referring and communicating with other health care professionals, documenting  clinical information in the EHR, independently interpreting results, communicating results, and coordinating care.   [name], MD 04/02/2024

## 2024-04-03 ENCOUNTER — Telehealth: Payer: Self-pay | Admitting: Internal Medicine

## 2024-04-03 NOTE — Telephone Encounter (Signed)
 Patient is scheduled for thryoid f/u on 04/25/24 and per appt note, is requesting labs be drawn prior to appt. Can these orders be placed? Please advise.

## 2024-04-12 ENCOUNTER — Telehealth: Payer: Self-pay

## 2024-04-12 NOTE — Telephone Encounter (Signed)
 PAP reenrollment for year 2026    Application has been submitted for Kara [name]  through [facility] with both Patient and doctor signatures, along with requested documentation.  Status: Pending  - Patient Income Documentation, Patient is aware but is wanting to hold off in finishing application for re enrollment since Dr.Gudena has told pt to pause therapy. She does have a months supply on hand

## 2024-04-15 ENCOUNTER — Ambulatory Visit (HOSPITAL_COMMUNITY)

## 2024-04-15 NOTE — Addendum Note (Signed)
 Encounter addended by: Debarah Garrison MATSU, RN on: 04/15/2024 9:52 AM  Actions taken: Imaging Exam ended

## 2024-04-22 ENCOUNTER — Telehealth (HOSPITAL_COMMUNITY): Payer: Self-pay | Admitting: Internal Medicine

## 2024-04-22 NOTE — Telephone Encounter (Signed)
 Called to confirm/remind patient of their appointment at the [facility] on 04/22/2024.   Appointment:   [x] Confirmed  [] Left mess   [] No answer/No voice mail  [] VM Full/unable to leave message  [] Phone not in service  Patient reminded to bring all medications and/or complete list.  Confirmed patient has transportation. Gave directions, instructed to utilize valet parking.

## 2024-04-23 ENCOUNTER — Ambulatory Visit (HOSPITAL_COMMUNITY)
Admission: RE | Admit: 2024-04-23 | Discharge: 2024-04-23 | Disposition: A | Discharge status: Home / Self Care | Source: Ambulatory Visit | Attending: Internal Medicine | Admitting: Internal Medicine

## 2024-04-23 ENCOUNTER — Encounter (HOSPITAL_COMMUNITY): Payer: Self-pay | Admitting: Internal Medicine

## 2024-04-23 VITALS — BP 110/70 | HR 80 | Wt 137.6 lb

## 2024-04-23 DIAGNOSIS — R002 Palpitations: Secondary | ICD-10-CM | POA: Insufficient documentation

## 2024-04-23 DIAGNOSIS — I493 Ventricular premature depolarization: Secondary | ICD-10-CM | POA: Insufficient documentation

## 2024-04-23 DIAGNOSIS — Z7982 Long term (current) use of aspirin: Secondary | ICD-10-CM | POA: Diagnosis not present

## 2024-04-23 DIAGNOSIS — I7121 Aneurysm of the ascending aorta, without rupture: Secondary | ICD-10-CM | POA: Diagnosis not present

## 2024-04-23 DIAGNOSIS — Z634 Disappearance and death of family member: Secondary | ICD-10-CM | POA: Diagnosis not present

## 2024-04-23 DIAGNOSIS — Z87891 Personal history of nicotine dependence: Secondary | ICD-10-CM | POA: Diagnosis not present

## 2024-04-23 DIAGNOSIS — Z79899 Other long term (current) drug therapy: Secondary | ICD-10-CM | POA: Diagnosis not present

## 2024-04-23 DIAGNOSIS — I10 Essential (primary) hypertension: Secondary | ICD-10-CM | POA: Diagnosis not present

## 2024-04-23 DIAGNOSIS — D75839 Thrombocytosis, unspecified: Secondary | ICD-10-CM | POA: Diagnosis not present

## 2024-04-23 DIAGNOSIS — D473 Essential (hemorrhagic) thrombocythemia: Secondary | ICD-10-CM | POA: Diagnosis not present

## 2024-04-23 DIAGNOSIS — I472 Ventricular tachycardia, unspecified: Secondary | ICD-10-CM | POA: Insufficient documentation

## 2024-04-23 DIAGNOSIS — I471 Supraventricular tachycardia, unspecified: Secondary | ICD-10-CM | POA: Diagnosis not present

## 2024-04-23 DIAGNOSIS — Z604 Social exclusion and rejection: Secondary | ICD-10-CM | POA: Insufficient documentation

## 2024-04-23 NOTE — Patient Instructions (Addendum)
 Follow-Up in: as needed with our clinic, please follow up with General Cardiology-- referral placed  At the [facility], you and your health needs are our priority. We have a designated team specialized in the treatment of Heart Failure. This Care Team includes your primary Heart Failure Specialized Cardiologist (physician), Advanced Practice Providers (APPs- Physician Assistants and Nurse Practitioners), and Pharmacist who all work together to provide you with the care you need, when you need it.   You may see any of the following providers on your designated Care Team at your next follow up:  Dr. Toribio Fuel Dr. Ezra Shuck Dr. Odis Brownie Greig Mosses, NP Caffie Shed, [place] [facility]  [place], [place] Beckey Coe, NP Jordan Lee, NP Tinnie Redman, PharmD   Please be sure to bring in all your medications bottles to every appointment.   Need to Contact Us :  If you have any questions or concerns before your next appointment please send us  a message through [place] or call our office at [phone].    TO LEAVE A MESSAGE FOR THE NURSE SELECT OPTION 2, PLEASE LEAVE A MESSAGE INCLUDING: YOUR NAME DATE OF BIRTH CALL BACK NUMBER REASON FOR CALL**this is important as we prioritize the call backs  YOU WILL RECEIVE A CALL BACK THE SAME DAY AS LONG AS YOU CALL BEFORE 4:00 PM

## 2024-04-23 NOTE — Addendum Note (Signed)
 Encounter addended by: Jeselle Hiser B, RN on: 04/23/2024 10:39 AM  Actions taken: Clinical Note Signed, Order list changed, Diagnosis association updated

## 2024-04-23 NOTE — Progress Notes (Signed)
 "  ADVANCED HF CLINIC NOTE  Primary Care: Jesus Bernardino MATSU, MD  Chief complaint: HTN  HPI: Kara [name] is a 74 y/o woman with essential thrombocytosis, hereditary hemochromatosis, prior tobacco use, ascending aortic aneurysm (4.1 cm by CTA 10/25) followed by [facility].  Denies h/o known heart disease. Was started on anagrelide  in 2023 and developed palpitations:   Echo 09/06/20 EF 60-65% RVSP   Zio 8/23 - Sinus rhythm. 1 six-beat run NSVT, 180 runs of SVT longest 11.5 seconds. Rare PACs and PVCs  cMRI 02/21/22 EF 61% no LGE. No evidence of hemochromatosis.   Seen in HF clinic in 12/23 for palpitations which had improved after stopping anagrelide .    She returns today for follow up.Overall feeling pretty well. Following BP 3x/day SBP typically 130s.Says she is under a lot of stress with the recent death of her mother at age 10.     Past Medical History:  Diagnosis Date   Allergy Unknown   Penecillin   Anemia Unknown   Review lab tests from [facility]   Aneurysm of ascending aorta without rupture    Aortic atherosclerosis    Cataract One month   Very early stages   Clotting disorder Unknown   Hemochromatosis & Jax2 Mutation   Essential thrombocytosis (HCC)    Hemochromatosis    Jax's mutation   History of smoking 25-50 pack years    Hypertension Unknown   Sometimes   Lung nodule    Orthostatic hypotension 05/20/2016   Other emphysema (HCC)    Sleep apnea Unknown   Probably due to collapsed septum    Current Outpatient Medications  Medication Sig Dispense Refill   aspirin  EC 81 MG tablet Take 1 tablet (81 mg total) by mouth daily.     hydroxyurea  (HYDREA ) 500 MG capsule Take 1 capsule (500 mg total) by mouth daily. May take with food to minimize GI side effects. 30 capsule 1   losartan  (COZAAR ) 25 MG tablet Take 1.5 tablets (37.5 mg total) by mouth daily. 60 tablet 9   Alpha-Lipoic Acid 600 MG CAPS Take 1 capsule (600 mg total) by mouth daily at 6 (six) AM. (Patient not  taking: Reported on 04/23/2024) 90 capsule 3   ruxolitinib  phosphate (JAKAFI ) 5 MG tablet Take 1 tablet (5 mg total) by mouth 2 (two) times daily. (Patient not taking: Reported on 04/23/2024) 60 tablet 3   Current Facility-Administered Medications  Medication Dose Route Frequency Provider Last Rate Last Admin   0.9 %  sodium chloride  infusion  500 mL Intravenous Once Armbruster, Elspeth SQUIBB, MD        Allergies  Allergen Reactions   Quinolones Other (See Comments)    Ascending thoracic aortic aneurysm   Demerol [Meperidine Hcl] Other (See Comments)    Passed out after surgery   Penicillins Rash   Tramadol Nausea And Vomiting      Social History   Socioeconomic History   Marital status: Single    Spouse name: Not on file   Number of children: 0   Years of education: college   Highest education level: Master's degree (e.g., MA, Kara, MEng, MEd, MSW, MBA)  Occupational History   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    She works as a associate professor for [employer].   Highest level education:  Masters   Right-handed.   Caffeine use: 4 cups per day.      Are you right handed or left handed? Right Handed   Are you currently employed ? 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 No    Who lives with you? With mother    What type of home do you live in: 1 story or 2  story? Lives in a one story floor plan        Social Drivers of Health   Tobacco Use: Medium Risk (04/23/2024)   Patient History    Smoking Tobacco Use: Former    Smokeless Tobacco Use: Never    Passive Exposure: Not on file  Financial Resource Strain: Low Risk (11/15/2023)   Overall Financial Resource Strain (CARDIA)    Difficulty of Paying Living Expenses: Not hard at all  Food Insecurity: No Food Insecurity (03/26/2024)   Epic    Worried About Programme Researcher, Broadcasting/film/video in the Last Year: Never true    Ran Out of Food in the Last Year: Never true  Transportation Needs: No Transportation Needs (03/26/2024)   Epic    Lack of Transportation (Medical): No    Lack of Transportation (Non-Medical): No  Physical Activity: Insufficiently Active (03/26/2024)   Exercise Vital Sign    Days of Exercise per Week: 5 days    Minutes of Exercise per Session: 10 min  Stress: No Stress Concern Present (03/26/2024)   [employer] of Occupational Health - Occupational Stress Questionnaire    Feeling of Stress: Not at all  Social Connections: Socially Isolated (03/26/2024)   Social Connection and Isolation Panel    Frequency of Communication with Friends and Family: More than three times a week    Frequency of Social Gatherings with Friends and Family: More than three times a week    Attends Religious Services: Never    Database Administrator or Organizations: No    Attends Banker Meetings: Never    Marital Status: Never married  Intimate Partner Violence: Not At Risk (03/26/2024)   Epic    Fear of Current or Ex-Partner: No    Emotionally Abused: No    Physically Abused: No    Sexually Abused: No  Depression (PHQ2-9): Low Risk (03/26/2024)   Depression (PHQ2-9)    PHQ-2 Score: 0  Alcohol Screen: Not on file  Housing: Low Risk (03/26/2024)   Epic    Unable to Pay for Housing in the Last Year: No    Number of Times Moved in the Last Year: 0    Homeless in the Last Year: No  Utilities:  Not At Risk (03/26/2024)   Epic    Threatened with loss of utilities: No  Health Literacy: Adequate Health Literacy (03/26/2024)   B1300 Health Literacy    Frequency of need for help with medical instructions: Never  Family History  Problem Relation Age of Onset   Arthritis Mother    Acute myelogenous leukemia Father    Bipolar disorder Sister    Yvone' disease Sister    Heart disease Sister    Stroke Maternal Grandmother    Heart attack Maternal Grandfather    Heart disease Paternal Grandmother    Heart disease Paternal Grandfather    Diabetes Neg Hx    Cancer Neg Hx    Heart failure Neg Hx    Hyperlipidemia Neg Hx    Hypertension Neg Hx     Vitals:   04/23/24 1007  BP: 110/70  Pulse: 80  SpO2: 95%  Weight: 62.4 kg (137 lb 9.6 oz)    Filed Weights   04/23/24 1007  Weight: 62.4 kg (137 lb 9.6 oz)    PHYSICAL EXAM: General:  Sitting up in bed. No resp difficulty HEENT: normal Neck: supple. no JVD.  Cor: Regular rate & rhythm. No rubs, gallops or murmurs. Lungs: clear Abdomen: soft, nontender, nondistended.Good bowel sounds. Extremities: no cyanosis, clubbing, rash, edema Neuro: alert & orientedx3, cranial nerves grossly intact. moves all 4 extremities w/o difficulty. Affect pleasant but tearful   ASSESSMENT & PLAN:  HTN - BP well controlled now - continue current regimen  2. SVT/Palpitations  - Echo 09/06/16 EF 60-65% RVSP  - Zio 8/23 - SR. 6 beat run NSVT, 180 runs of SVT longest 11.5 seconds (mostly 3-7 beats). Rare PVCs - CMR 11/23 EF 61% no LGE. No evidence of hemochromatosis.  - previously on anagrelide , palpitations improved after coming off - now having palpitations again intermittently; regular on exam - Zio 12/25 Sinus rhythm avg 76. 92 runs of SVT longest 16 beats - I reassured her on this - We discussed potential b-blocker for symptom control but she says palpitations have resolved. Can add b-blocker as needed  3. Thrombocytosis -  cMRI no evidence of hemochromatosis - off jakifi now on hydroxyurea   4. Ascending aortic aneurysm - 4.1 cm on CTA chest 10/25 - Follows with [facility] - BP under good control   Can graduate cardi-oncology/HF clinic. Refer to General Cardiology.   Toribio Fuel, MD  10:35 AM   "

## 2024-04-25 ENCOUNTER — Ambulatory Visit: Admitting: Internal Medicine

## 2024-04-25 ENCOUNTER — Inpatient Hospital Stay (HOSPITAL_BASED_OUTPATIENT_CLINIC_OR_DEPARTMENT_OTHER): Admitting: Hematology and Oncology

## 2024-04-25 ENCOUNTER — Inpatient Hospital Stay: Attending: Hematology and Oncology

## 2024-04-25 VITALS — BP 124/82 | HR 71 | Temp 97.8°F | Resp 18 | Ht 60.0 in | Wt 136.8 lb

## 2024-04-25 DIAGNOSIS — D473 Essential (hemorrhagic) thrombocythemia: Secondary | ICD-10-CM | POA: Diagnosis not present

## 2024-04-25 DIAGNOSIS — I10 Essential (primary) hypertension: Secondary | ICD-10-CM | POA: Diagnosis not present

## 2024-04-25 DIAGNOSIS — D75839 Thrombocytosis, unspecified: Secondary | ICD-10-CM | POA: Diagnosis not present

## 2024-04-25 DIAGNOSIS — Z79899 Other long term (current) drug therapy: Secondary | ICD-10-CM | POA: Insufficient documentation

## 2024-04-25 LAB — CBC WITH DIFFERENTIAL (CANCER CENTER ONLY)
Abs Immature Granulocytes: 0.02 K/uL (ref 0.00–0.07)
Basophils Absolute: 0 K/uL (ref 0.0–0.1)
Basophils Relative: 0 %
Eosinophils Absolute: 0.2 K/uL (ref 0.0–0.5)
Eosinophils Relative: 2 %
HCT: 39.9 % (ref 36.0–46.0)
Hemoglobin: 13.3 g/dL (ref 12.0–15.0)
Immature Granulocytes: 0 %
Lymphocytes Relative: 19 %
Lymphs Abs: 1.5 K/uL (ref 0.7–4.0)
MCH: 32.2 pg (ref 26.0–34.0)
MCHC: 33.3 g/dL (ref 30.0–36.0)
MCV: 96.6 fL (ref 80.0–100.0)
Monocytes Absolute: 0.6 K/uL (ref 0.1–1.0)
Monocytes Relative: 8 %
Neutro Abs: 5.5 K/uL (ref 1.7–7.7)
Neutrophils Relative %: 71 %
Platelet Count: 638 K/uL — ABNORMAL HIGH (ref 150–400)
RBC: 4.13 MIL/uL (ref 3.87–5.11)
RDW: 15.9 % — ABNORMAL HIGH (ref 11.5–15.5)
WBC Count: 7.8 K/uL (ref 4.0–10.5)
nRBC: 0 % (ref 0.0–0.2)

## 2024-04-25 LAB — IRON AND IRON BINDING CAPACITY (CC-WL,HP ONLY)
Iron: 109 ug/dL (ref 28–170)
Saturation Ratios: 35 % — ABNORMAL HIGH (ref 10.4–31.8)
TIBC: 311 ug/dL (ref 250–450)
UIBC: 202 ug/dL

## 2024-04-25 LAB — FERRITIN: Ferritin: 50 ng/mL (ref 11–307)

## 2024-04-25 NOTE — Progress Notes (Signed)
 "  Patient Care Team: Jesus Bernardino MATSU, MD as PCP - General (Internal Medicine) Odean Potts, MD as Consulting Physician (Hematology and Oncology) Bensimhon, Toribio SAUNDERS, MD as Consulting Physician (Cardiology) Patel, Donika K, DO as Consulting Physician (Neurology)  DIAGNOSIS:  Encounter Diagnosis  Name Primary?   Essential thrombocytosis (HCC) Yes   CHIEF COMPLIANT: Follow-up of essential thrombocytosis  HISTORY OF PRESENT ILLNESS:  History of Present Illness Kara [name] is a 74 year old female with essential thrombocythemia and JAK2 mutation who presents for hematology follow-up to assess disease control and persistent symptoms.  She is three weeks into a renewed course of hydroxyurea , with 20 doses taken after stopping ruxolitinib  for lack of efficacy. Platelets have decreased from 905 to 638 x10^9/L, which she finds encouraging, but she remains concerned about ongoing disease activity and risk of progression.  She reports persistent fatigue, paresthesias, pruritus, and inflammatory symptoms involving her eyes and knuckles. Ocular symptoms are severe, with intense pruritus, dryness, and discomfort, and she recently sustained a corneal scratch from rubbing despite multiple ineffective topical therapies. She also has continuous upper respiratory-type symptoms with facial flushing, rhinorrhea, and malaise, and overall energy remains poor.  She is unsure if her symptoms are fully explained by essential thrombocythemia. An immunology evaluation did not show autoimmune disease, and further thyroid  testing was recommended through her GP. She remains focused on clarifying whether her symptoms are related to her myeloproliferative disorder.  She specifically inquired about interferon therapy for potential disease-modifying benefit, acknowledges its side effect profile and need for clinical trial access, and requests review of current evidence. Antihistamines have not relieved her pruritus or ocular  symptoms and worsen dryness.  She is also under substantial psychosocial stress related to relocation, work, and caregiving, which she feels worsens her overall health and symptom burden.      ALLERGIES:  is allergic to quinolones, demerol [meperidine hcl], penicillins, and tramadol.  MEDICATIONS:  Current Outpatient Medications  Medication Sig Dispense Refill   Alpha-Lipoic Acid 600 MG CAPS Take 1 capsule (600 mg total) by mouth daily at 6 (six) AM. (Patient not taking: Reported on 04/23/2024) 90 capsule 3   aspirin  EC 81 MG tablet Take 1 tablet (81 mg total) by mouth daily.     hydroxyurea  (HYDREA ) 500 MG capsule Take 1 capsule (500 mg total) by mouth daily. May take with food to minimize GI side effects. 30 capsule 1   losartan  (COZAAR ) 25 MG tablet Take 1.5 tablets (37.5 mg total) by mouth daily. 60 tablet 9   ruxolitinib  phosphate (JAKAFI ) 5 MG tablet Take 1 tablet (5 mg total) by mouth 2 (two) times daily. (Patient not taking: Reported on 04/23/2024) 60 tablet 3   Current Facility-Administered Medications  Medication Dose Route Frequency Provider Last Rate Last Admin   0.9 %  sodium chloride  infusion  500 mL Intravenous Once Armbruster, Elspeth SQUIBB, MD        PHYSICAL EXAMINATION: ECOG PERFORMANCE STATUS: 1 - Symptomatic but completely ambulatory  Vitals:   04/25/24 1004  BP: 124/82  Pulse: 71  Resp: 18  Temp: 97.8 F (36.6 C)  SpO2: 98%   Filed Weights   04/25/24 1004  Weight: 136 lb 12.8 oz (62.1 kg)      LABORATORY DATA:  I have reviewed the data as listed    Latest Ref Rng & Units 03/27/2024    9:29 AM 03/05/2024   10:35 AM 01/02/2024    2:23 PM  CMP  Glucose 70 - 99 mg/dL 898  89  103   BUN 8 - 23 mg/dL 9  8  16    Creatinine 0.44 - 1.00 mg/dL 9.43  9.49  9.34   Sodium 135 - 145 mmol/L 137  132  140   Potassium 3.5 - 5.1 mmol/L 4.2  4.2  4.1   Chloride 98 - 111 mmol/L 106  100  107   CO2 22 - 32 mmol/L 23  22  27    Calcium 8.9 - 10.3 mg/dL 9.3  9.6  9.4    Total Protein 6.5 - 8.1 g/dL  7.3  7.3   Total Bilirubin 0.0 - 1.2 mg/dL  0.4  0.3   Alkaline Phos 38 - 126 U/L  59  71   AST 15 - 41 U/L  23  20   ALT 0 - 44 U/L  12  14     Lab Results  Component Value Date   WBC 7.8 04/25/2024   HGB 13.3 04/25/2024   HCT 39.9 04/25/2024   MCV 96.6 04/25/2024   PLT 638 (H) 04/25/2024   NEUTROABS 5.5 04/25/2024    ASSESSMENT & PLAN:  Essential thrombocytosis (HCC) 06/18/21:  Hb 11.2, Platelet 778 07/30/2021: Hemoglobin 12.9, platelets 783, iron saturation 13%, ferritin 7  06/28/2023: Platelets 668, iron saturation 52%, ferritin 16 10/11/2024: Platelets 648, iron saturation 56%, ferritin 59 12/12/2023: Platelets 732, iron saturation 42%, ferritin 68   01/02/24: Platelets 746, Iron sat: 42%, Ferritin: 65 03/05/2024: Platelets 798, iron saturation 28%, ferritin pending 04/02/2024: Platelets 905    Current treatment:  , currently on Jakafi  at the low-dose of 5 mg twice a day. Started 09/18/2022, recommended discontinuing Jakafi  and switched to hydroxyurea  500 milligrams a day December 2025, continue with aspirin  daily   Hemochromatosis: On periodic phlebotomies.     Hypertension: Went to emergency room 02/10/2024.  Followed with cardiology started on losartan .Remarkable improvement in her headaches. Improvement in her balance.  This is also remarkably improved her energy levels.   Her mom passed away 5 weeks ago. In 08-28-24 she is thinking of moving to [place] or [place]. Follow-up back in 2 weeks with labs    No orders of the defined types were placed in this encounter.  The patient has a good understanding of the overall plan. she agrees with it. she will call with any problems that may develop before the next visit here.  I personally spent a total of 30 minutes in the care of the patient today including preparing to see the patient, getting/reviewing separately obtained history, performing a medically appropriate exam/evaluation, counseling and  educating, placing orders, referring and communicating with other health care professionals, documenting clinical information in the EHR, independently interpreting results, communicating results, and coordinating care.   Viinay K Zorianna Taliaferro, MD 04/25/2024    "

## 2024-04-25 NOTE — Assessment & Plan Note (Signed)
 06/18/21:  Hb 11.2, Platelet 778 07/30/2021: Hemoglobin 12.9, platelets 783, iron saturation 13%, ferritin 7  06/28/2023: Platelets 668, iron saturation 52%, ferritin 16 10/11/2024: Platelets 648, iron saturation 56%, ferritin 59 12/12/2023: Platelets 732, iron saturation 42%, ferritin 68   01/02/24: Platelets 746, Iron sat: 42%, Ferritin: 65 03/05/2024: Platelets 798, iron saturation 28%, ferritin pending 04/02/2024: Platelets 905    Current treatment:  , currently on Jakafi  at the low-dose of 5 mg twice a day. Started 09/18/2022, recommended discontinuing Jakafi  and switching to hydroxyurea  finder milligrams a day, continue with aspirin  daily   Facial numbness: Saw neurology Dr.Patel: MRI brain: 08/28/2023: No acute findings. Pain on skull and neck: went to chiropractor: Dr.Spell did alignment of skull and pain has resolved. Insomnia: intermittent   Relapse shingles episodes: Waxing and waning symptoms: completed Valcyclovir 01/17/24 -01/27/24 (did not improve her symptoms) She went on a diet and lost 10 pounds. Hemochromatosis: On periodic phlebotomies.     Hypertension: Went to emergency room 02/10/2024.  Followed with cardiology started on losartan .Remarkable improvement in her headaches. Improvement in her balance.  This is also remarkably improved her energy levels.   Her mom passed away 5 weeks ago. In 08-22-24 she is thinking of moving to [place] or [place]. Follow-up back in 2 weeks with labs

## 2024-05-01 ENCOUNTER — Other Ambulatory Visit: Payer: Self-pay

## 2024-05-01 ENCOUNTER — Other Ambulatory Visit (HOSPITAL_COMMUNITY): Payer: Self-pay

## 2024-05-01 ENCOUNTER — Ambulatory Visit: Admitting: Internal Medicine

## 2024-05-01 ENCOUNTER — Encounter: Payer: Self-pay | Admitting: Hematology and Oncology

## 2024-05-01 LAB — OPHTHALMOLOGY REPORT-SCANNED

## 2024-05-01 MED ORDER — PREDNISOLONE ACETATE 1 % OP SUSP
1.0000 [drp] | Freq: Two times a day (BID) | OPHTHALMIC | 1 refills | Status: AC
  Filled 2024-05-01: qty 10, 30d supply, fill #0

## 2024-05-01 MED ORDER — CROMOLYN SODIUM 4 % OP SOLN
1.0000 [drp] | Freq: Two times a day (BID) | OPHTHALMIC | 4 refills | Status: AC
  Filled 2024-05-01: qty 10, 90d supply, fill #0

## 2024-05-01 MED ORDER — NEOMYCIN-POLYMYXIN-DEXAMETH 0.1 % OP OINT
TOPICAL_OINTMENT | Freq: Two times a day (BID) | OPHTHALMIC | 0 refills | Status: AC
  Filled 2024-05-01: qty 3.5, 30d supply, fill #0

## 2024-05-01 NOTE — Progress Notes (Incomplete)
***  In Progress***    Advanced Heart Failure Clinic Note   Primary Care: Jesus Bernardino MATSU, MD  HF Cardiologist: Dr. Cherrie   HPI:  Kara [name] is a 74 y/o woman with essential thrombocytosis, hereditary hemochromatosis, prior tobacco use, ascending aortic aneurysm (4.1 cm by CTA 10/25) followed by [facility].   Denies h/o known heart disease. Was started on anagrelide  in 2023 and developed palpitations:    Echo 09/06/20 EF 60-65% RVSP    Zio 8/23 - Sinus rhythm. 1 six-beat run NSVT, 180 runs of SVT longest 11.5 seconds. Rare PACs and PVCs   cMRI 02/21/22 EF 61% no LGE. No evidence of hemochromatosis.    Seen in HF clinic in 12/23 for palpitations which had improved after stopping anagrelide .     She returns today for follow up.Overall feeling pretty well. Following BP 3x/day SBP typically 130s.Says she is under a lot of stress with the recent death of her mother at age 56.   Today he returns to HF clinic for pharmacist medication titration. At last visit with MD ***.   Shortness of breath/dyspnea on exertion? {YES NO:22349}  Orthopnea/PND? {YES NO:22349} Edema? {YES NO:22349} Lightheadedness/dizziness? {YES NO:22349} Daily weights at home? {YES NO:22349} Blood pressure/heart rate monitoring at home? {YES [id number] Following low-sodium/fluid-restricted diet? {YES NO:22349}  HF Medications:   Has the patient been experiencing any side effects to the medications prescribed?  {YES NO:22349}  Does the patient have any problems obtaining medications due to transportation or finances?   {YES NO:22349}  Understanding of regimen: {excellent/good/fair/poor:19665} Understanding of indications: {excellent/good/fair/poor:19665} Potential of compliance: {excellent/good/fair/poor:19665} Patient understands to avoid NSAIDs. Patient understands to avoid decongestants.    Pertinent Lab Values: Serum creatinine ***, BUN ***, Potassium ***, Sodium ***, BNP ***, Magnesium ***, Digoxin ***    Vital Signs: Weight: *** (last clinic weight: ***) Blood pressure: ***  Heart rate: ***   Assessment/Plan: 1. Chronic systolic CHF (EF ***), due to ***. NYHA class *** symptoms.   - Basic disease state pathophysiology, medication indication, mechanism and side effects reviewed at length with patient and he verbalized understanding  Follow up ***   Kara [name], PharmD, BCPS, BCCP, CPP Heart Failure Clinic Pharmacist [phone]

## 2024-05-02 ENCOUNTER — Other Ambulatory Visit: Payer: Self-pay

## 2024-05-15 ENCOUNTER — Ambulatory Visit (HOSPITAL_COMMUNITY)

## 2024-05-17 ENCOUNTER — Ambulatory Visit: Admitting: Internal Medicine

## 2024-05-23 ENCOUNTER — Encounter: Payer: Self-pay | Admitting: Hematology and Oncology

## 2024-05-23 ENCOUNTER — Inpatient Hospital Stay: Admitting: Hematology and Oncology

## 2024-05-23 ENCOUNTER — Inpatient Hospital Stay: Attending: Hematology and Oncology

## 2024-05-23 DIAGNOSIS — D473 Essential (hemorrhagic) thrombocythemia: Secondary | ICD-10-CM

## 2024-05-23 LAB — CBC WITH DIFFERENTIAL (CANCER CENTER ONLY)
Abs Immature Granulocytes: 0.02 10*3/uL (ref 0.00–0.07)
Basophils Absolute: 0 10*3/uL (ref 0.0–0.1)
Basophils Relative: 0 %
Eosinophils Absolute: 0.2 10*3/uL (ref 0.0–0.5)
Eosinophils Relative: 3 %
HCT: 43.2 % (ref 36.0–46.0)
Hemoglobin: 14.7 g/dL (ref 12.0–15.0)
Immature Granulocytes: 0 %
Lymphocytes Relative: 18 %
Lymphs Abs: 1.4 10*3/uL (ref 0.7–4.0)
MCH: 33.5 pg (ref 26.0–34.0)
MCHC: 34 g/dL (ref 30.0–36.0)
MCV: 98.4 fL (ref 80.0–100.0)
Monocytes Absolute: 0.6 10*3/uL (ref 0.1–1.0)
Monocytes Relative: 7 %
Neutro Abs: 5.7 10*3/uL (ref 1.7–7.7)
Neutrophils Relative %: 72 %
Platelet Count: 580 10*3/uL — ABNORMAL HIGH (ref 150–400)
RBC: 4.39 MIL/uL (ref 3.87–5.11)
RDW: 17.6 % — ABNORMAL HIGH (ref 11.5–15.5)
WBC Count: 8 10*3/uL (ref 4.0–10.5)
nRBC: 0 % (ref 0.0–0.2)

## 2024-05-23 LAB — IRON AND IRON BINDING CAPACITY (CC-WL,HP ONLY)
Iron: 144 ug/dL (ref 28–170)
Saturation Ratios: 46 % — ABNORMAL HIGH (ref 10.4–31.8)
TIBC: 315 ug/dL (ref 250–450)
UIBC: 171 ug/dL

## 2024-05-23 LAB — FERRITIN: Ferritin: 51 ng/mL (ref 11–307)

## 2024-05-23 NOTE — Assessment & Plan Note (Signed)
 06/18/21:  Hb 11.2, Platelet 778 07/30/2021: Hemoglobin 12.9, platelets 783, iron saturation 13%, ferritin 7  06/28/2023: Platelets 668, iron saturation 52%, ferritin 16 10/11/2024: Platelets 648, iron saturation 56%, ferritin 59 12/12/2023: Platelets 732, iron saturation 42%, ferritin 68   01/02/24: Platelets 746, Iron sat: 42%, Ferritin: 65 03/05/2024: Platelets 798, iron saturation 28%, ferritin pending 04/02/2024: Platelets 905 04/25/2024: Platelets 638, ferritin 50, iron saturation 35% (currently on Hydrea )    Current treatment:  , currently on Jakafi  at the low-dose of 5 mg twice a day. Started 09/18/2022, discontinue Jakafi  and switched to hydroxyurea  500 milligrams a day 2025/12/25continue with aspirin  daily   Hemochromatosis: On periodic phlebotomies.     Hypertension: Went to emergency room 02/10/2024.  Followed with cardiology started on losartan .Remarkable improvement in her headaches. Improvement in her balance.  This is also remarkably improved her energy levels.   Her mom passed away 04/11/24. In 2024/08/10 she is thinking of moving to [place] or [place]. Follow-up back in 2 weeks with labs

## 2024-05-23 NOTE — Progress Notes (Signed)
 "  Patient Care Team: Jesus Bernardino MATSU, MD as PCP - General (Internal Medicine) Odean Potts, MD as Consulting Physician (Hematology and Oncology) Bensimhon, Toribio SAUNDERS, MD as Consulting Physician (Cardiology) Patel, Donika K, DO as Consulting Physician (Neurology)  DIAGNOSIS:  Encounter Diagnoses  Name Primary?   Hereditary hemochromatosis Yes   Essential thrombocytosis (HCC)       CHIEF COMPLIANT: Follow-up of essential thrombocytosis and hereditary hemochromatosis  HISTORY OF PRESENT ILLNESS:   History of Present Illness Kara [name] is a 74 year old female with essential thrombocythemia (JAK2 mutation) and hereditary hemochromatosis who presents for hematology/oncology follow-up to assess disease control and treatment tolerability.  Essential thrombocythemia is in clinical remission on hydroxyurea  after stopping ruxolitinib , with platelets trending down (recently 900, 600, 580, 638 x10^9/L). She notes marked improvement with resolution of headaches, palpitations, discomfort, and cognitive issues. She denies current ET-related symptoms and has not had hair loss or other notable hydroxyurea  side effects.  Hereditary hemochromatosis remains clinically stable. Iron saturation is rising gradually and is now 46%. Ferritin decreased from 80 to 50 without intervention. She is asymptomatic and is willing to resume phlebotomy if iron saturation exceeds 50%.  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 She is currently unable to read due to vision changes but expects improvement with treatment.  She has mild residual localized pain from prior herpes zoster that does not affect daily activities. She denies other zoster-related symptoms.  She noticed transient blood pressure fluctuation this morning but reports overall good blood pressure control. Prior fatigue and cognitive impairment have resolved. She  denies current fatigue or cognitive symptoms.  She was recently evaluated by an autoimmune specialist, who did not identify an autoimmune disease.      ALLERGIES:  is allergic to quinolones, demerol [meperidine hcl], penicillins, and tramadol.  MEDICATIONS:  Current Outpatient Medications  Medication Sig Dispense Refill   aspirin  EC 81 MG tablet Take 1 tablet (81 mg total) by mouth daily.     cromolyn  (OPTICROM ) 4 % ophthalmic solution Instill 1 drop into both eyes twice a day 10 mL 4   hydroxyurea  (HYDREA ) 500 MG capsule Take 1 capsule (500 mg total) by mouth daily. May take with food to minimize GI side effects. 30 capsule 1   losartan  (COZAAR ) 25 MG tablet Take 1.5 tablets (37.5 mg total) by mouth daily. 60 tablet 9   Lysine HCl (L-FORMULA LYSINE HCL) 500 MG TABS Take by mouth.     magnesium (MAGTAB) 84 MG ( ) TBCR SR tablet Take 200 mg by mouth.     Multiple Vitamins-Minerals (MULTIVITAMIN WITH MINERALS) tablet Take 1 tablet by mouth daily.     neomycin -polymyxin-dexameth (MAXITROL ) 0.1 % OINT Place 0.25g into the left eye lid 2 (two) times daily. 3.5 g 0   prednisoLONE  acetate (PRED FORTE ) 1 % ophthalmic suspension Instill 1 drop into both eyes twice a day 10 mL 1   Turmeric (QC TUMERIC COMPLEX PO) Take by mouth.     XDEMVY 0.25 % SOLN Apply 1 drop to eye 2 (two) times daily.     Alpha-Lipoic Acid 600 MG CAPS Take 1 capsule (600 mg total) by mouth daily at 6 (six) AM. (Patient not taking: Reported on 05/23/2024) 90 capsule 3   Current Facility-Administered Medications  Medication Dose Route Frequency Provider Last Rate Last Admin   0.9 %  sodium chloride  infusion  500 mL Intravenous Once Armbruster, Elspeth SQUIBB, MD  PHYSICAL EXAMINATION: ECOG PERFORMANCE STATUS: 1 - Symptomatic but completely ambulatory  Vitals:   05/23/24 1059  BP: 130/82  Pulse: 70  Resp: 18  Temp: 97.6 F (36.4 C)  SpO2: 97%   Filed Weights   05/23/24 1059  Weight: 136 lb (61.7 kg)     LABORATORY DATA:  I have reviewed the data as listed    Latest Ref Rng & Units 03/27/2024    9:29 AM 03/05/2024   10:35 AM 01/02/2024    2:23 PM  CMP  Glucose 70 - 99 mg/dL 898  89  896   BUN 8 - 23 mg/dL 9  8  16    Creatinine 0.44 - 1.00 mg/dL 9.43  9.49  9.34   Sodium 135 - 145 mmol/L 137  132  140   Potassium 3.5 - 5.1 mmol/L 4.2  4.2  4.1   Chloride 98 - 111 mmol/L 106  100  107   CO2 22 - 32 mmol/L 23  22  27    Calcium 8.9 - 10.3 mg/dL 9.3  9.6  9.4   Total Protein 6.5 - 8.1 g/dL  7.3  7.3   Total Bilirubin 0.0 - 1.2 mg/dL  0.4  0.3   Alkaline Phos 38 - 126 U/L  59  71   AST 15 - 41 U/L  23  20   ALT 0 - 44 U/L  12  14     Lab Results  Component Value Date   WBC 8.0 05/23/2024   HGB 14.7 05/23/2024   HCT 43.2 05/23/2024   MCV 98.4 05/23/2024   PLT 580 (H) 05/23/2024   NEUTROABS 5.7 05/23/2024    ASSESSMENT & PLAN:  Essential thrombocytosis (HCC) 06/18/21:  Hb 11.2, Platelet 778 07/30/2021: Hemoglobin 12.9, platelets 783, iron saturation 13%, ferritin 7  06/28/2023: Platelets 668, iron saturation 52%, ferritin 16 10/11/2024: Platelets 648, iron saturation 56%, ferritin 59 12/12/2023: Platelets 732, iron saturation 42%, ferritin 68   01/02/24: Platelets 746, Iron sat: 42%, Ferritin: 65 03/05/2024: Platelets 798, iron saturation 28%, ferritin pending 04/02/2024: Platelets 905 04/25/2024: Platelets 638, ferritin 50, iron saturation 35% (currently on Hydrea ) 05/23/2024: Platelets 580    Current treatment:  , currently on Jakafi  at the low-dose of 5 mg twice a day. Started 09/18/2022, discontinue Jakafi  and switched to hydroxyurea  500 milligrams a day December 2025, continue with aspirin  daily   Hemochromatosis: On periodic phlebotomies.     Hypertension: Went to emergency room 02/10/2024.  Followed with cardiology started on losartan .Remarkable improvement in her headaches. Improvement in her balance.  This is also remarkably improved her energy levels.   Her mom passed  away December 2025. In April she is thinking of moving to [place] or [place]. Follow-up back in 2 weeks with labs ------------------------------------- Assessment and Plan Assessment & Plan Essential thrombocythemia JAK2-positive, well-controlled on hydroxyurea  with normalized platelet counts. Interferon therapy discussed for future refractory or symptomatic disease. - Continue hydroxyurea  500 mg orally daily. - Continue aspirin  81 mg orally daily. - Interferon therapy reserved for refractory or symptomatic disease. - Schedule follow-up visits every other month.  Hereditary hemochromatosis Iron saturation borderline at 46% with stable ferritin. - Monitor ferritin and iron saturation levels. - Consider phlebotomy if iron saturation exceeds 50% or ferritin increases significantly.      No orders of the defined types were placed in this encounter.  The patient has a good understanding of the overall plan. she agrees with it. she will call with any problems that may develop  before the next visit here.  I personally spent a total of 30 minutes in the care of the patient today including preparing to see the patient, getting/reviewing separately obtained history, performing a medically appropriate exam/evaluation, counseling and educating, placing orders, referring and communicating with other health care professionals, documenting clinical information in the EHR, independently interpreting results, communicating results, and coordinating care.   Dr.Charman Blasco 05/23/24    "

## 2024-05-28 ENCOUNTER — Ambulatory Visit: Admitting: Internal Medicine

## 2024-07-25 ENCOUNTER — Inpatient Hospital Stay: Admitting: Hematology and Oncology

## 2024-07-25 ENCOUNTER — Inpatient Hospital Stay: Attending: Hematology and Oncology

## 2025-03-27 ENCOUNTER — Ambulatory Visit
